# Patient Record
Sex: Male | Born: 1950 | Race: Black or African American | Hispanic: No | Marital: Single | Smoking: Former smoker
Health system: Southern US, Community
[De-identification: ages and names within clinical notes are randomized; demographics above are authoritative.]

## PROBLEM LIST (undated history)

## (undated) DIAGNOSIS — R51 Headache: Secondary | ICD-10-CM

## (undated) DIAGNOSIS — C61 Malignant neoplasm of prostate: Secondary | ICD-10-CM

## (undated) DIAGNOSIS — J449 Chronic obstructive pulmonary disease, unspecified: Secondary | ICD-10-CM

## (undated) DIAGNOSIS — I471 Supraventricular tachycardia, unspecified: Secondary | ICD-10-CM

## (undated) DIAGNOSIS — I509 Heart failure, unspecified: Secondary | ICD-10-CM

## (undated) DIAGNOSIS — K219 Gastro-esophageal reflux disease without esophagitis: Secondary | ICD-10-CM

## (undated) DIAGNOSIS — J189 Pneumonia, unspecified organism: Secondary | ICD-10-CM

## (undated) DIAGNOSIS — F329 Major depressive disorder, single episode, unspecified: Secondary | ICD-10-CM

## (undated) DIAGNOSIS — N182 Chronic kidney disease, stage 2 (mild): Secondary | ICD-10-CM

## (undated) DIAGNOSIS — I10 Essential (primary) hypertension: Secondary | ICD-10-CM

## (undated) DIAGNOSIS — B1920 Unspecified viral hepatitis C without hepatic coma: Secondary | ICD-10-CM

## (undated) DIAGNOSIS — M109 Gout, unspecified: Secondary | ICD-10-CM

## (undated) DIAGNOSIS — F32A Depression, unspecified: Secondary | ICD-10-CM

## (undated) DIAGNOSIS — I4891 Unspecified atrial fibrillation: Secondary | ICD-10-CM

## (undated) DIAGNOSIS — R0602 Shortness of breath: Secondary | ICD-10-CM

## (undated) DIAGNOSIS — M1990 Unspecified osteoarthritis, unspecified site: Secondary | ICD-10-CM

## (undated) DIAGNOSIS — K449 Diaphragmatic hernia without obstruction or gangrene: Secondary | ICD-10-CM

## (undated) DIAGNOSIS — Z950 Presence of cardiac pacemaker: Secondary | ICD-10-CM

## (undated) DIAGNOSIS — R011 Cardiac murmur, unspecified: Secondary | ICD-10-CM

## (undated) DIAGNOSIS — I429 Cardiomyopathy, unspecified: Secondary | ICD-10-CM

## (undated) HISTORY — PX: SUPRAVENTRICULAR TACHYCARDIA ABLATION: SHX6106

## (undated) HISTORY — DX: Unspecified viral hepatitis C without hepatic coma: B19.20

---

## 1997-11-06 ENCOUNTER — Encounter: Payer: Self-pay | Admitting: Emergency Medicine

## 1997-11-06 ENCOUNTER — Emergency Department (HOSPITAL_COMMUNITY): Admission: EM | Admit: 1997-11-06 | Discharge: 1997-11-06 | Payer: Self-pay | Admitting: Emergency Medicine

## 1998-08-08 ENCOUNTER — Encounter: Payer: Self-pay | Admitting: Emergency Medicine

## 1998-08-08 ENCOUNTER — Emergency Department (HOSPITAL_COMMUNITY): Admission: EM | Admit: 1998-08-08 | Discharge: 1998-08-08 | Payer: Self-pay | Admitting: Emergency Medicine

## 2000-12-21 ENCOUNTER — Inpatient Hospital Stay (HOSPITAL_COMMUNITY): Admission: EM | Admit: 2000-12-21 | Discharge: 2000-12-26 | Payer: Self-pay | Admitting: Emergency Medicine

## 2000-12-21 ENCOUNTER — Encounter: Payer: Self-pay | Admitting: Emergency Medicine

## 2001-01-18 ENCOUNTER — Inpatient Hospital Stay (HOSPITAL_COMMUNITY): Admission: EM | Admit: 2001-01-18 | Discharge: 2001-01-23 | Payer: Self-pay | Admitting: *Deleted

## 2001-01-22 ENCOUNTER — Encounter: Payer: Self-pay | Admitting: Cardiovascular Disease

## 2004-01-02 ENCOUNTER — Encounter (INDEPENDENT_AMBULATORY_CARE_PROVIDER_SITE_OTHER): Payer: Self-pay | Admitting: Cardiovascular Disease

## 2004-01-02 ENCOUNTER — Inpatient Hospital Stay (HOSPITAL_COMMUNITY): Admission: EM | Admit: 2004-01-02 | Discharge: 2004-01-04 | Payer: Self-pay | Admitting: *Deleted

## 2005-05-26 ENCOUNTER — Emergency Department (HOSPITAL_COMMUNITY): Admission: EM | Admit: 2005-05-26 | Discharge: 2005-05-26 | Payer: Self-pay | Admitting: Emergency Medicine

## 2006-05-06 ENCOUNTER — Inpatient Hospital Stay (HOSPITAL_COMMUNITY): Admission: EM | Admit: 2006-05-06 | Discharge: 2006-05-11 | Payer: Self-pay | Admitting: Emergency Medicine

## 2006-05-07 ENCOUNTER — Encounter (INDEPENDENT_AMBULATORY_CARE_PROVIDER_SITE_OTHER): Payer: Self-pay | Admitting: Cardiovascular Disease

## 2007-06-16 ENCOUNTER — Inpatient Hospital Stay (HOSPITAL_COMMUNITY): Admission: AD | Admit: 2007-06-16 | Discharge: 2007-06-18 | Payer: Self-pay | Admitting: Cardiovascular Disease

## 2007-06-17 ENCOUNTER — Encounter (INDEPENDENT_AMBULATORY_CARE_PROVIDER_SITE_OTHER): Payer: Self-pay | Admitting: Cardiovascular Disease

## 2007-07-07 ENCOUNTER — Encounter: Admission: RE | Admit: 2007-07-07 | Discharge: 2007-07-07 | Payer: Self-pay | Admitting: Cardiovascular Disease

## 2008-12-10 ENCOUNTER — Inpatient Hospital Stay (HOSPITAL_COMMUNITY): Admission: EM | Admit: 2008-12-10 | Discharge: 2008-12-12 | Payer: Self-pay | Admitting: Emergency Medicine

## 2008-12-12 ENCOUNTER — Encounter (INDEPENDENT_AMBULATORY_CARE_PROVIDER_SITE_OTHER): Payer: Self-pay | Admitting: Cardiovascular Disease

## 2009-12-10 ENCOUNTER — Inpatient Hospital Stay (HOSPITAL_COMMUNITY): Admission: EM | Admit: 2009-12-10 | Discharge: 2009-12-13 | Payer: Self-pay | Admitting: Emergency Medicine

## 2009-12-13 ENCOUNTER — Encounter (INDEPENDENT_AMBULATORY_CARE_PROVIDER_SITE_OTHER): Payer: Self-pay | Admitting: Cardiovascular Disease

## 2010-01-20 ENCOUNTER — Emergency Department (HOSPITAL_COMMUNITY)
Admission: EM | Admit: 2010-01-20 | Discharge: 2010-01-20 | Payer: Self-pay | Source: Home / Self Care | Admitting: Emergency Medicine

## 2010-04-12 ENCOUNTER — Inpatient Hospital Stay (HOSPITAL_COMMUNITY)
Admission: EM | Admit: 2010-04-12 | Discharge: 2010-04-21 | DRG: 292 | Disposition: A | Discharge status: Home / Self Care | Payer: Medicare Other | Attending: Cardiovascular Disease | Admitting: Cardiovascular Disease

## 2010-04-12 DIAGNOSIS — I4729 Other ventricular tachycardia: Secondary | ICD-10-CM | POA: Diagnosis present

## 2010-04-12 DIAGNOSIS — I2789 Other specified pulmonary heart diseases: Secondary | ICD-10-CM | POA: Diagnosis present

## 2010-04-12 DIAGNOSIS — F1410 Cocaine abuse, uncomplicated: Secondary | ICD-10-CM | POA: Diagnosis present

## 2010-04-12 DIAGNOSIS — I472 Ventricular tachycardia, unspecified: Secondary | ICD-10-CM | POA: Diagnosis present

## 2010-04-12 DIAGNOSIS — I428 Other cardiomyopathies: Secondary | ICD-10-CM | POA: Diagnosis present

## 2010-04-12 DIAGNOSIS — I5023 Acute on chronic systolic (congestive) heart failure: Principal | ICD-10-CM | POA: Diagnosis present

## 2010-04-12 DIAGNOSIS — I129 Hypertensive chronic kidney disease with stage 1 through stage 4 chronic kidney disease, or unspecified chronic kidney disease: Secondary | ICD-10-CM | POA: Diagnosis present

## 2010-04-12 DIAGNOSIS — N183 Chronic kidney disease, stage 3 unspecified: Secondary | ICD-10-CM | POA: Diagnosis present

## 2010-04-12 DIAGNOSIS — I509 Heart failure, unspecified: Secondary | ICD-10-CM | POA: Diagnosis present

## 2010-04-12 DIAGNOSIS — K449 Diaphragmatic hernia without obstruction or gangrene: Secondary | ICD-10-CM | POA: Diagnosis present

## 2010-04-12 DIAGNOSIS — F17200 Nicotine dependence, unspecified, uncomplicated: Secondary | ICD-10-CM | POA: Diagnosis present

## 2010-04-13 ENCOUNTER — Emergency Department (HOSPITAL_COMMUNITY): Payer: Medicare Other

## 2010-04-13 LAB — URINE MICROSCOPIC-ADD ON

## 2010-04-13 LAB — CK TOTAL AND CKMB (NOT AT ARMC)
CK, MB: 20.3 ng/mL (ref 0.3–4.0)
Relative Index: 7.1 — ABNORMAL HIGH (ref 0.0–2.5)
Total CK: 286 U/L — ABNORMAL HIGH (ref 7–232)

## 2010-04-13 LAB — COMPREHENSIVE METABOLIC PANEL
Albumin: 3 g/dL — ABNORMAL LOW (ref 3.5–5.2)
BUN: 47 mg/dL — ABNORMAL HIGH (ref 6–23)
Chloride: 103 mEq/L (ref 96–112)
Creatinine, Ser: 2.44 mg/dL — ABNORMAL HIGH (ref 0.4–1.5)
GFR calc non Af Amer: 27 mL/min — ABNORMAL LOW (ref >60)
Glucose, Bld: 138 mg/dL — ABNORMAL HIGH (ref 70–99)
Total Bilirubin: 2.8 mg/dL — ABNORMAL HIGH (ref 0.3–1.2)

## 2010-04-13 LAB — DIFFERENTIAL
Basophils Absolute: 0 10*3/uL (ref 0.0–0.1)
Basophils Relative: 0 % (ref 0–1)
Eosinophils Absolute: 0 10*3/uL (ref 0.0–0.7)
Eosinophils Relative: 0 % (ref 0–5)
Lymphocytes Relative: 14 % (ref 12–46)
Lymphs Abs: 1.1 10*3/uL (ref 0.7–4.0)
Monocytes Absolute: 0.8 10*3/uL (ref 0.1–1.0)
Monocytes Relative: 9 % (ref 3–12)
Neutro Abs: 6.3 10*3/uL (ref 1.7–7.7)
Neutrophils Relative %: 77 % (ref 43–77)

## 2010-04-13 LAB — URINALYSIS, ROUTINE W REFLEX MICROSCOPIC
Hgb urine dipstick: NEGATIVE
Ketones, ur: NEGATIVE mg/dL
Leukocytes, UA: NEGATIVE
Nitrite: NEGATIVE
Protein, ur: 100 mg/dL — AB
Specific Gravity, Urine: 1.019 (ref 1.005–1.030)
Urine Glucose, Fasting: NEGATIVE mg/dL
Urobilinogen, UA: 1 mg/dL (ref 0.0–1.0)
pH: 5 (ref 5.0–8.0)

## 2010-04-13 LAB — CBC
HCT: 48.7 % (ref 39.0–52.0)
Hemoglobin: 16.1 g/dL (ref 13.0–17.0)
MCH: 32.9 pg (ref 26.0–34.0)
MCHC: 33.1 g/dL (ref 30.0–36.0)
MCV: 99.6 fL (ref 78.0–100.0)
Platelets: 210 10*3/uL (ref 150–400)
RBC: 4.89 MIL/uL (ref 4.22–5.81)
RDW: 13.3 % (ref 11.5–15.5)
WBC: 8.2 10*3/uL (ref 4.0–10.5)

## 2010-04-13 LAB — BASIC METABOLIC PANEL
BUN: 40 mg/dL — ABNORMAL HIGH (ref 6–23)
CO2: 19 mEq/L (ref 19–32)
Calcium: 8.9 mg/dL (ref 8.4–10.5)
Chloride: 100 mEq/L (ref 96–112)
Creatinine, Ser: 2.61 mg/dL — ABNORMAL HIGH (ref 0.4–1.5)
GFR calc Af Amer: 31 mL/min — ABNORMAL LOW (ref >60)
GFR calc non Af Amer: 25 mL/min — ABNORMAL LOW (ref >60)
Glucose, Bld: 111 mg/dL — ABNORMAL HIGH (ref 70–99)
Potassium: 4.7 mEq/L (ref 3.5–5.1)
Sodium: 129 mEq/L — ABNORMAL LOW (ref 135–145)

## 2010-04-13 LAB — CARDIAC PANEL(CRET KIN+CKTOT+MB+TROPI)
CK, MB: 16.1 ng/mL (ref 0.3–4.0)
Total CK: 189 U/L (ref 7–232)
Total CK: 188 U/L (ref 7–232)
Troponin I: 0.1 ng/mL — ABNORMAL HIGH (ref 0.00–0.06)

## 2010-04-13 LAB — APTT: aPTT: 28 seconds (ref 24–37)

## 2010-04-13 LAB — BRAIN NATRIURETIC PEPTIDE: Pro B Natriuretic peptide (BNP): >3200 pg/mL — ABNORMAL HIGH (ref 0.0–100.0)

## 2010-04-13 LAB — TROPONIN I: Troponin I: 0.1 ng/mL — ABNORMAL HIGH (ref 0.00–0.06)

## 2010-04-13 LAB — PROTIME-INR
INR: 1.31 (ref 0.00–1.49)
Prothrombin Time: 16.5 seconds — ABNORMAL HIGH (ref 11.6–15.2)

## 2010-04-14 LAB — BASIC METABOLIC PANEL
CO2: 21 mEq/L (ref 19–32)
Chloride: 105 mEq/L (ref 96–112)
Glucose, Bld: 116 mg/dL — ABNORMAL HIGH (ref 70–99)
Potassium: 4.2 mEq/L (ref 3.5–5.1)
Sodium: 137 mEq/L (ref 135–145)

## 2010-04-15 LAB — BASIC METABOLIC PANEL
CO2: 27 mEq/L (ref 19–32)
Chloride: 104 mEq/L (ref 96–112)
GFR calc Af Amer: 35 mL/min — ABNORMAL LOW (ref >60)
Glucose, Bld: 103 mg/dL — ABNORMAL HIGH (ref 70–99)
Potassium: 3.7 mEq/L (ref 3.5–5.1)
Sodium: 138 mEq/L (ref 135–145)

## 2010-04-16 LAB — BASIC METABOLIC PANEL
CO2: 28 mEq/L (ref 19–32)
GFR calc Af Amer: 51 mL/min — ABNORMAL LOW (ref >60)
GFR calc non Af Amer: 42 mL/min — ABNORMAL LOW (ref >60)
Glucose, Bld: 118 mg/dL — ABNORMAL HIGH (ref 70–99)
Potassium: 3.8 mEq/L (ref 3.5–5.1)
Sodium: 136 mEq/L (ref 135–145)

## 2010-04-19 LAB — CBC
HCT: 43.1 % (ref 39.0–52.0)
Hemoglobin: 14.5 g/dL (ref 13.0–17.0)
MCH: 33 pg (ref 26.0–34.0)
MCHC: 33.6 g/dL (ref 30.0–36.0)
RBC: 4.39 MIL/uL (ref 4.22–5.81)

## 2010-04-19 LAB — BASIC METABOLIC PANEL
CO2: 29 mEq/L (ref 19–32)
Chloride: 97 mEq/L (ref 96–112)
Creatinine, Ser: 1.39 mg/dL (ref 0.4–1.5)
GFR calc Af Amer: >60 mL/min (ref >60)
Potassium: 3.7 mEq/L (ref 3.5–5.1)

## 2010-04-19 LAB — BRAIN NATRIURETIC PEPTIDE: Pro B Natriuretic peptide (BNP): 570 pg/mL — ABNORMAL HIGH (ref 0.0–100.0)

## 2010-04-21 LAB — BASIC METABOLIC PANEL
CO2: 29 mEq/L (ref 19–32)
Calcium: 9 mg/dL (ref 8.4–10.5)
Chloride: 98 mEq/L (ref 96–112)
GFR calc Af Amer: 58 mL/min — ABNORMAL LOW (ref >60)
Glucose, Bld: 86 mg/dL (ref 70–99)
Sodium: 133 mEq/L — ABNORMAL LOW (ref 135–145)

## 2010-04-29 LAB — CBC
HCT: 39.6 % (ref 39.0–52.0)
Hemoglobin: 13.3 g/dL (ref 13.0–17.0)
MCH: 33.7 pg (ref 26.0–34.0)
MCHC: 33.6 g/dL (ref 30.0–36.0)
MCV: 100.3 fL — ABNORMAL HIGH (ref 78.0–100.0)
RBC: 3.95 MIL/uL — ABNORMAL LOW (ref 4.22–5.81)

## 2010-04-29 LAB — BASIC METABOLIC PANEL
CO2: 22 mEq/L (ref 19–32)
Chloride: 109 mEq/L (ref 96–112)
GFR calc Af Amer: 42 mL/min — ABNORMAL LOW (ref >60)
Glucose, Bld: 122 mg/dL — ABNORMAL HIGH (ref 70–99)
Potassium: 4.7 mEq/L (ref 3.5–5.1)
Sodium: 138 mEq/L (ref 135–145)

## 2010-04-30 LAB — COMPREHENSIVE METABOLIC PANEL
AST: 191 U/L — ABNORMAL HIGH (ref 0–37)
Albumin: 2.9 g/dL — ABNORMAL LOW (ref 3.5–5.2)
Calcium: 8.8 mg/dL (ref 8.4–10.5)
Creatinine, Ser: 1.98 mg/dL — ABNORMAL HIGH (ref 0.4–1.5)
GFR calc Af Amer: 42 mL/min — ABNORMAL LOW (ref >60)
Total Protein: 5.6 g/dL — ABNORMAL LOW (ref 6.0–8.3)

## 2010-04-30 LAB — BRAIN NATRIURETIC PEPTIDE
Pro B Natriuretic peptide (BNP): >3200 pg/mL — ABNORMAL HIGH (ref 0.0–100.0)
Pro B Natriuretic peptide (BNP): >3200 pg/mL — ABNORMAL HIGH (ref 0.0–100.0)

## 2010-04-30 LAB — BASIC METABOLIC PANEL
BUN: 35 mg/dL — ABNORMAL HIGH (ref 6–23)
CO2: 22 mEq/L (ref 19–32)
CO2: 22 mEq/L (ref 19–32)
CO2: 26 mEq/L (ref 19–32)
Chloride: 101 mEq/L (ref 96–112)
Chloride: 109 mEq/L (ref 96–112)
Chloride: 110 mEq/L (ref 96–112)
Creatinine, Ser: 2.32 mg/dL — ABNORMAL HIGH (ref 0.4–1.5)
GFR calc Af Amer: 38 mL/min — ABNORMAL LOW (ref >60)
GFR calc non Af Amer: 39 mL/min — ABNORMAL LOW (ref >60)
Glucose, Bld: 122 mg/dL — ABNORMAL HIGH (ref 70–99)
Glucose, Bld: 140 mg/dL — ABNORMAL HIGH (ref 70–99)
Potassium: 3.3 mEq/L — ABNORMAL LOW (ref 3.5–5.1)
Potassium: 3.8 mEq/L (ref 3.5–5.1)
Potassium: 4.5 mEq/L (ref 3.5–5.1)
Sodium: 137 mEq/L (ref 135–145)

## 2010-04-30 LAB — POCT I-STAT 3, ART BLOOD GAS (G3+)
Bicarbonate: 29.1 mEq/L — ABNORMAL HIGH (ref 20.0–24.0)
TCO2: 30 mmol/L (ref 0–100)
pCO2 arterial: 44.5 mmHg (ref 35.0–45.0)
pH, Arterial: 7.423 (ref 7.350–7.450)
pO2, Arterial: 54 mmHg — ABNORMAL LOW (ref 80.0–100.0)

## 2010-04-30 LAB — CARDIAC PANEL(CRET KIN+CKTOT+MB+TROPI)
CK, MB: 7 ng/mL (ref 0.3–4.0)
Total CK: 180 U/L (ref 7–232)
Troponin I: 0.08 ng/mL — ABNORMAL HIGH (ref 0.00–0.06)
Troponin I: 0.09 ng/mL — ABNORMAL HIGH (ref 0.00–0.06)

## 2010-04-30 LAB — CBC
HCT: 41.1 % (ref 39.0–52.0)
HCT: 44 % (ref 39.0–52.0)
Hemoglobin: 14.9 g/dL (ref 13.0–17.0)
MCH: 33.7 pg (ref 26.0–34.0)
MCHC: 33.9 g/dL (ref 30.0–36.0)
MCV: 101.2 fL — ABNORMAL HIGH (ref 78.0–100.0)
RDW: 13.2 % (ref 11.5–15.5)
WBC: 7 10*3/uL (ref 4.0–10.5)

## 2010-04-30 LAB — CULTURE, BLOOD (ROUTINE X 2): Culture  Setup Time: 201110251355

## 2010-04-30 LAB — DIFFERENTIAL
Basophils Absolute: 0.1 10*3/uL (ref 0.0–0.1)
Eosinophils Absolute: 0.1 10*3/uL (ref 0.0–0.7)
Eosinophils Relative: 1 % (ref 0–5)
Lymphocytes Relative: 18 % (ref 12–46)
Lymphs Abs: 1.3 10*3/uL (ref 0.7–4.0)
Monocytes Absolute: 0.7 10*3/uL (ref 0.1–1.0)

## 2010-04-30 LAB — POCT CARDIAC MARKERS
CKMB, poc: 3.6 ng/mL (ref 1.0–8.0)
Troponin i, poc: <0.05 ng/mL (ref 0.00–0.09)
Troponin i, poc: <0.05 ng/mL (ref 0.00–0.09)

## 2010-04-30 LAB — POCT I-STAT 3, VENOUS BLOOD GAS (G3P V)
Bicarbonate: 30.1 mEq/L — ABNORMAL HIGH (ref 20.0–24.0)
TCO2: 31 mmol/L (ref 0–100)
pCO2, Ven: 44.7 mmHg — ABNORMAL LOW (ref 45.0–50.0)
pH, Ven: 7.436 — ABNORMAL HIGH (ref 7.250–7.300)

## 2010-05-10 NOTE — Discharge Summary (Signed)
[name]                 ACCOUNT NO.:  [id number]  MEDICAL RECORD NO.:  [id number]           PATIENT TYPE:  I  LOCATION:  4735                         FACILITY:  [facility]  PHYSICIAN:  Ricki Rodriguez, M.D.  DATE OF BIRTH:  11-Jan-1951  DATE OF ADMISSION:  04/12/2010 DATE OF DISCHARGE:  04/21/2010                              DISCHARGE SUMMARY   The patient was admitted by Dr. Donia Guiles.  DISCHARGING PHYSICIAN:  Ricki Rodriguez, MD  FINAL DIAGNOSES: 1. Acute-on-chronic left heart systolic failure. 2. Acute bronchitis. 3. Chronic obstructive lung disease. 4. Chronic kidney disease, stage II. 5. Nonsustained ventricular tachycardia. 6. Dilated cardiomyopathy.  DISCHARGE MEDICATIONS: 1. Digoxin 0.125 mg one daily. 2. Diltiazem 30 mg one twice daily. 3. Guaifenesin DM 15 mL every 4 hour as needed. 4. Isosorbide dinitrate and hydralazine 20/37.5 mg (BiDil) one tablet     three times daily. 5. Loratadine 10 mg daily. 6. Spironolactone 25 mg daily. 7. Warfarin 2 mg daily at bedtime. 8. Aspirin 81 mg daily in a.m. 9. Bystolic 10 mg daily in the morning and 5 mg daily in the evening. 10.K-Dur 10 mEq one daily. 11.Lasix 40 mg one in the morning. 12.Lisinopril 10 mg one daily. 13.Nexium 40 mg one in the morning. 14.Oxycodone 5 mg one daily as needed. 15.Tylenol Extra Strength 500 mg two tablets every 8 hours as needed.  DISCHARGE DIET:  Low-sodium heart-healthy diet with a 1200 mL fluid restriction per day.  DISCHARGE ACTIVITY:  The patient will increase activity slowly as tolerated.  Followup by Dr. [name] in 1 week.  The patient is to call [phone] for appointment. Followup by [facility], Dr. Madilyn Fireman, for hiatal hernia and reflux esophagitis symptoms evaluation.  The patient is to call [phone] for appointment on April 23, 2010, in the morning.  SPECIAL INSTRUCTION:  The patient is to stop any activity that causes chest pain, shortness of  breath, dizziness, sweating, or excessive weakness.  HISTORY:  This 60 year old black male with a nonischemic cardiomyopathy presented with symptoms of worsening heart failure including shortness of breath, paroxysmal nocturnal dyspnea, and orthopnea.  PHYSICAL EXAMINATION:  VITAL SIGNS:  Blood pressure 136/102, heart rate 60, respirations 25. GENERAL:  The patient is a thin black male, in mild respiratory distress. HEENT:  The patient is normocephalic, atraumatic with brown eyes. Conjunctivae pink.  Sclerae nonicteric. NECK:  Moderate JVD. LUNGS:  Bibasilar crackles. HEART:  Normal S1 and S2 with grade 2/6 systolic murmur. ABDOMEN:  Soft and mild epigastric area tenderness. EXTREMITIES:  A 1+ edema.  LABORATORY DATA:  Normal hemoglobin/hematocrit, WBC count, and platelet count.  INR 1.31.  Electrolytes normal, BUN elevated at 47, creatinine elevated at 2.44.  Subsequent BUN was 30, creatinine was 1.49.  Albumin was low at 3.0 and total bilirubin was slightly elevated at 2.8, alkaline phosphatase was normal.  AST was 125 and ALT was 99.  CK elevated at 286 with an MB of 20.3, troponin I elevated at 0.10. Urinalysis was unremarkable except for 100 mg of protein and few hyaline cast with cloudy appearance of the urine.  EKG showed  wide QRS tachycardia. A 2-D echocardiogram showed mildly dilated left ventricle with a severe LV systolic dysfunction and diffuse hypokinesia, severe mitral regurgitation, and mild thickness of right ventricle and mild dilatation of the right atrium also along with mild dilatation of left atrium. Chest x-ray showed stable severe cardiomegaly with no acute cardiopulmonary abnormality.  HOSPITAL COURSE:  The patient was admitted to telemetry unit.  He had mildly abnormal cardiac enzymes consistent with his acute heart failure. He received IV Lasix and beta-blocker therapy to control his wide- complex tachycardia as well as heart failure.  His condition  gradually stabilized over 1 week period.  He developed bronchitis in between requiring Z-Pak and breathing treatments with Xopenex.  His renal function slightly improved and his bronchitis was controlled.  He had complaints about reflux esophagitis.  GI consultation recommended outpatient followup in few days, appointment was given.  On April 21, 2010, the patient was discharged home in satisfactory condition with addition of Lanoxin adjustment of his diltiazem dose.  Addition of BiDil, spironolactone, and he was started on low-dose Coumadin.     Ricki Rodriguez, M.D.     ASK/MEDQ  D:  05/07/2010  T:  05/08/2010  Job:  [id number]  Electronically Signed by [name] M.D. on 05/10/2010 09:20:13 PM

## 2010-05-22 LAB — URINALYSIS, ROUTINE W REFLEX MICROSCOPIC
Glucose, UA: NEGATIVE mg/dL
Hgb urine dipstick: NEGATIVE
Protein, ur: 30 mg/dL — AB

## 2010-05-22 LAB — CBC
HCT: 38.1 % — ABNORMAL LOW (ref 39.0–52.0)
MCHC: 33.5 g/dL (ref 30.0–36.0)
Platelets: 214 10*3/uL (ref 150–400)
Platelets: 245 10*3/uL (ref 150–400)
RBC: 4.05 MIL/uL — ABNORMAL LOW (ref 4.22–5.81)
RDW: 13 % (ref 11.5–15.5)
RDW: 13.3 % (ref 11.5–15.5)
WBC: 5.4 10*3/uL (ref 4.0–10.5)

## 2010-05-22 LAB — BASIC METABOLIC PANEL
BUN: 25 mg/dL — ABNORMAL HIGH (ref 6–23)
BUN: 35 mg/dL — ABNORMAL HIGH (ref 6–23)
CO2: 24 mEq/L (ref 19–32)
Calcium: 9 mg/dL (ref 8.4–10.5)
Chloride: 106 mEq/L (ref 96–112)
Chloride: 109 mEq/L (ref 96–112)
Creatinine, Ser: 2.14 mg/dL — ABNORMAL HIGH (ref 0.4–1.5)
GFR calc Af Amer: 39 mL/min — ABNORMAL LOW (ref >60)
GFR calc Af Amer: 40 mL/min — ABNORMAL LOW (ref >60)
GFR calc non Af Amer: 32 mL/min — ABNORMAL LOW (ref >60)
Potassium: 4 mEq/L (ref 3.5–5.1)

## 2010-05-22 LAB — COMPREHENSIVE METABOLIC PANEL
ALT: 61 U/L — ABNORMAL HIGH (ref 0–53)
Albumin: 3.3 g/dL — ABNORMAL LOW (ref 3.5–5.2)
Alkaline Phosphatase: 94 U/L (ref 39–117)
GFR calc Af Amer: 38 mL/min — ABNORMAL LOW (ref >60)
Potassium: 4.5 mEq/L (ref 3.5–5.1)
Sodium: 136 mEq/L (ref 135–145)
Total Protein: 6.1 g/dL (ref 6.0–8.3)

## 2010-05-22 LAB — BRAIN NATRIURETIC PEPTIDE: Pro B Natriuretic peptide (BNP): 1569 pg/mL — ABNORMAL HIGH (ref 0.0–100.0)

## 2010-05-22 LAB — HEPARIN LEVEL (UNFRACTIONATED): Heparin Unfractionated: 1.02 IU/mL — ABNORMAL HIGH (ref 0.30–0.70)

## 2010-05-22 LAB — DIFFERENTIAL
Basophils Absolute: 0.1 10*3/uL (ref 0.0–0.1)
Basophils Relative: 1 % (ref 0–1)
Neutro Abs: 4.2 10*3/uL (ref 1.7–7.7)
Neutrophils Relative %: 67 % (ref 43–77)

## 2010-05-22 LAB — URINE MICROSCOPIC-ADD ON

## 2010-05-22 LAB — POCT I-STAT, CHEM 8
Chloride: 110 mEq/L (ref 96–112)
Creatinine, Ser: 2.2 mg/dL — ABNORMAL HIGH (ref 0.4–1.5)
Glucose, Bld: 113 mg/dL — ABNORMAL HIGH (ref 70–99)
HCT: 41 % (ref 39.0–52.0)
Potassium: 4.6 mEq/L (ref 3.5–5.1)
Sodium: 137 mEq/L (ref 135–145)

## 2010-05-22 LAB — POCT CARDIAC MARKERS
CKMB, poc: 3 ng/mL (ref 1.0–8.0)
Myoglobin, poc: 93.9 ng/mL (ref 12–200)
Troponin i, poc: <0.05 ng/mL (ref 0.00–0.09)

## 2010-05-22 LAB — TROPONIN I
Troponin I: 0.09 ng/mL — ABNORMAL HIGH (ref 0.00–0.06)
Troponin I: 0.09 ng/mL — ABNORMAL HIGH (ref 0.00–0.06)

## 2010-05-22 LAB — CK TOTAL AND CKMB (NOT AT ARMC)
CK, MB: 4 ng/mL (ref 0.3–4.0)
CK, MB: 4.4 ng/mL — ABNORMAL HIGH (ref 0.3–4.0)
Relative Index: INVALID (ref 0.0–2.5)
Total CK: 78 U/L (ref 7–232)
Total CK: 81 U/L (ref 7–232)

## 2010-07-01 NOTE — Discharge Summary (Signed)
[name], [name]              ACCOUNT NO.:  [id number]   MEDICAL RECORD NO.:  [id number]          PATIENT TYPE:  INP   LOCATION:  4703                         FACILITY:  [facility]   PHYSICIAN:  Ricki Rodriguez, M.D.  DATE OF BIRTH:  1950-03-06   DATE OF ADMISSION:  06/16/2007  DATE OF DISCHARGE:  06/18/2007                               DISCHARGE SUMMARY   FINAL DIAGNOSES:  1. Non-Q-wave myocardial infarction.  2. Acute on chronic left systolic heart failure.  3. Dilated cardiomyopathy.  4. Sinus tachycardia.  5. Renal insufficiency.   DISCHARGE MEDICATIONS:  1. Azor 10/40 1/2 a tablet daily.  2. Metoprolol 50 mg 1/2 a tablet 3 times daily.  3. Coumadin 5 mg daily.  4. Lasix 40 mg daily.  5. K-Dur 20 mEg 1 daily.  6. Aspirin 81 mg daily.  7. Nitroglycerin 0.4 mg 1 sublingual every 5 minutes x3 p.r.n. for      chest pain.  8. Darvocet-N 100 one 4 times daily as needed.  9. Multivitamin 1 daily.   DISCHARGE DIET:  Low-sodium heart healthy diet, 650 mL excessive fluid  restriction.   DISCHARGE ACTIVITY:  The patient to increase activity slowly.   SPECIAL INSTRUCTION:  The patient to stop any activity that causes chest  pain, shortness of breath, dizziness, sweating, or excessive weakness.  The patient to notify physician when ready for cardiac catheterization  and ICD placement.   HISTORY:  This 60 year old black male presented with an atypical chest  pain, weakness, and palpitation.  The patient had stopped beta blocker  for 1 day.   PHYSICAL EXAMINATION:  VITAL SIGNS:  Temperature 98 degrees Fahrenheit,  pulse 145, respirations 20, blood pressure 92/70, height 5 feet 9  inches, and weight approximately 150 pounds.  GENERAL:  The patient is averagely built and nourished.  HEENT:  The patient is normocephalic and atraumatic.  Eyes, pupils are  equal and reacting to light.  Eyes are brown.  Conjunctivae are pink.  Sclerae nonicteric.  NECK:  Supple.  No JVD.  LUNGS:   Clear bilaterally.  HEART:  Normal S1 and S2 without S3 gallop.  ABDOMEN:  Soft and nontender.  EXTREMITIES:  No cyanosis.  Positive clubbing.  No edema.  NEUROLOGICAL:  Alert and oriented x3.  Cranial nerves are grossly  intact.   LABORATORY DATA:  Normal hemoglobin, hematocrit, WBC count, and platelet  count.  Normal PT/INR, normal electrolytes, and BUN.  Creatinine is  slightly high at 2.7.  Subsequent creatinine was down to 1.7, albumin  normal.  AST borderline at 45.  Cardiac enzymes, total CK elevated at  304, MB elevated at 13.5, and troponin I 0.12.  Subsequent CK-MB was  down to 7 and troponin I was 0.18 followed by 0.15.   EKG revealed a sinus rhythm, left axis deviation, left ventricular  hypertrophy, and anterolateral ischemia.   Chest x-ray revealed enlargement of the cardiac silhouette with no  pulmonary edema, pneumonia, or acute process.   A 2D echocardiogram revealed markedly decreased LV systolic function  with ejection fraction of 30%.  Left ventricular wall thickness was  moderately-to-markedly increased.  There was moderate mitral  regurgitation.  Left atrium was moderately dilated.  RV systolic  function was mildly decreased.   HOSPITAL COURSE:  The patient was admitted to telemetry unit.  He ruled  in for non-Q-wave myocardial infarction.  He received beta blocker IV  fluids.  A 2D echocardiogram and serial cardiac enzymes were suggestive  of non-Q-wave myocardial infarction along with early acute on chronic  left heart systolic failure.  The patient has a known history of dilated  cardiomyopathy and renal insufficiency.  His renal insufficiency  slightly improved.  He was offered cardiac catheterization and ICD  placement in near future.  The patient wanted to go home and discuss  this with the family members prior to committing to any additional  cardiac workup, and he was discharged home in satisfactory condition on  Jun 18, 2007 with a followup by me in 2  weeks.      Ricki Rodriguez, M.D.  Electronically Signed     ASK/MEDQ  D:  07/06/2007  T:  07/07/2007  Job:  [id number]

## 2010-07-04 NOTE — Cardiovascular Report (Signed)
[facility]  Patient:    [name], [name] Visit Number: [id number] MRN: [id number]          Service Type: MED Location: 2000 2017 01 Attending Physician:  Ricki Rodriguez Dictated by:   Ricki Rodriguez, M.D. Proc. Date: 12/23/00 Admit Date:  12/21/2000                          Cardiac Catheterization  HOSPITAL LOCATION 2017  PROCEDURE:  Right and left heart catheterization, selective coronary angiography, left ventricular function study, descending aortography, and cardiac output study using the Swan-Ganz catheter.  CARDIOLOGIST:  Ricki Rodriguez, M.D.  APPROACH:  Right femoral artery using 6-French diagnostic catheters and right femoral artery using 8-French sheath and a Swan-Ganz catheter.  INDICATION:  This 60 year old black male with dilated cardiomyopathy had congestive heart failure, abnormal cardiac enzymes, and angina.  COMPLICATIONS:  Catheter-induced supraventricular tachycardia, converted to sinus rhythm with 6 mg of intracoronary adenosine injection through Swan-Ganz catheter.  HEMODYNAMIC DATA:  The left ventricular pressure was 138/25 mmHg, and the aortic pressure was 137/105 mmHg.  The right axial mean pressure was 50 mmHg. The right ventricular pressure was 58/12 mmHg.  The mean PA pressure was 41 and 46, and pulmonary capillary wedge pressure was 33 and 22.  Cardiac output was 3.6, left there for a minute with IV milrinone use and 3 liter per minute without IV milrinone use.  Cardiac index was 1.93 liters per minute per square meter with milrinone and 1.61 without milrinone.  Oxygen saturations in the pulmonary artery was 56% and aorta was 94%.  Fick cardiac output was 3.4 with milrinone, and the cardiac index was 1.8.  LEFT VENTRICULOGRAM:  The left ventriculogram showed global hypokinesia with ejection fraction of 20 to 25% with severe mitral regurgitation.  AORTOGRAM:  The aortogram showed normal renal  arteries.  CORONARY ANATOMY: Left main coronary artery:  The left main coronary artery was unremarkable.  Left anterior descending coronary artery:  The left anterior descending coronary artery was unremarkable, and diagonal #1 was also unremarkable.  Left circumflex coronary artery:  The left circumflex coronary artery was also unremarkable, and the ramus branch was unremarkable.  Right coronary artery:  The right coronary artery was dominant and a somewhat larger vessel and unremarkable for any disease.  IMPRESSION: 1. Normal coronaries. 2. Dilated cardiomyopathy. 3. Severe mitral regurgitation. 4. Hypertension. 5. Congestive heart failure with a 20% augmentation in cardiac output with    intravenous milrinone use.  RECOMMENDATIONS:  This patient will continue his antihypertensive medications including diuretics and Lanoxin use along with Coreg, and he will receive outpatient IV milrinone therapy if oral medications fail.  The patient was again reminded of compliance to the diet, activity, and medications.  He will also be started on Coumadin therapy. Dictated by:   Ricki Rodriguez, M.D. Attending Physician:  Ricki Rodriguez DD:  12/23/00 TD:  12/24/00 Job: [id number]

## 2010-07-04 NOTE — Discharge Summary (Signed)
[name], [name]                 ACCOUNT NO.:  [id number]   MEDICAL RECORD NO.:  [id number]          PATIENT TYPE:  INP   LOCATION:  4705                         FACILITY:  [facility]   PHYSICIAN:  Ricki Rodriguez, M.D.  DATE OF BIRTH:  10-28-1950   DATE OF ADMISSION:  01/02/2004  DATE OF DISCHARGE:  01/04/2004                                 DISCHARGE SUMMARY   PRINCIPAL DIAGNOSIS:  1.  Congestive heart failure.  2.  Hypertension.  3.  Chest pain.  4.  Dilated cardiomyopathy.   DISCHARGE MEDICATIONS:  1.  Sular 20 mg one daily.  2 . Lasix 40 mg one daily.  1.  K-Dur 20 mEq two daily.  2.  Coreg 6.25 mg one daily.  3.  Lisinopril 20 mg daily.  4.  Coumadin 5 mg daily.  5.  Aspirin 325 mg daily times one week and then stop.  6.  Prilosec over-the-counter 20 mg one daily.   DISCHARGE INSTRUCTIONS:  1.  Activity is as tolerated.  2.  Diet is low fat, low salt, 1500 ml fluid restriction per day.  3.  Avoid greens.  4.  Get prothrombin time checked in one week, two weeks and then every      month.   FOLLOW UP:  Ricki Rodriguez, M.D. in two weeks.   CONDITION ON DISCHARGE:  Improved.   HISTORY:  This 60 year old, black male presented with chest pain for one  week along with shortness of breath without fever, cough or chills.  The  patient admitted to leg swelling and discontinued Lasix medication as he ran  out of it for monetary reasons.   The patient has a known history of dilated cardiomyopathy.   PHYSICAL EXAMINATION:  VITAL SIGNS:  Pulse 90, respirations 20, blood  pressure 160/110, height 5'9, weight 160 lbs.  GENERAL: The patient was alert, oriented x3.  HEENT:  The patient is normocephalic, atraumatic with brown eyes. Pupils  reactive to light.  Extraocular movements intact.  Conjunctivae pink.  Sclerae somewhat muddy.  NECK:  Positive JVD with third degree angle elevation to the jaw.  No  carotid bruits.  LUNGS:  Bilateral basilar crackles.  HEART:  Normal S1, S2  with a grade 3/6 systolic murmur at the left sternal  border and apex.  ABDOMEN: Soft, nontender.  EXTREMITIES:  1+ edema, no cyanosis or clubbing.   LABORATORY DATA:  Normal electrolytes, BUN and creatinine.  Sugar borderline  at 102.  Normal hemoglobin and hematocrit.  CK-MB borderline at 6.7.  troponin-I negative x2.  Myoglobin negative x2.  B-  type natriuretic peptide was markedly elevated at 2,423.  Chest x-ray  consistent with cardiomegaly and vascular condition.  Nuclear stress test  negative for stressed induced ischemia, ejection fraction 26% with global  hypokinesia.  EKG revealed normal sinus rhythm with bi-atrial enlargement,  left intrafascicular block and left ventricular hypertrophy.  Echocardiogram  revealed dilated cardiomyopathy with 20-30% ejection fraction.  Mild mitral  regurgitation, dilated left atrium, dilated right ventricle with mild right  ventricular hypertrophy, dilated right atrium and dilated inferior vena  cava.   HOSPITAL COURSE:  The patient was admitted to the telemetry unit and  myocardial infarction was ruled out.  He received IV Lasix with good  diuresis.  He was discharged home in satisfactory condition with  electrophysiology referral on an outpatient basis for possible need for AICD  after rechecking left ventricular function and after compliance with  medication.      ASK/MEDQ  D:  03/22/2004  T:  03/23/2004  Job:  [id number]

## 2010-07-04 NOTE — H&P (Signed)
NAMEOBADIAH, [name]                 ACCOUNT NO.:  [id number]   MEDICAL RECORD NO.:  [id number]          PATIENT TYPE:  INP   LOCATION:  4705                         FACILITY:  [facility]   PHYSICIAN:  Ricki Rodriguez, M.D.  DATE OF BIRTH:  1950-06-18   DATE OF ADMISSION:  01/02/2004  DATE OF DISCHARGE:                                HISTORY & PHYSICAL   CHIEF COMPLAINT:  Shortness of breath, leg swelling, and chest pain.   HISTORY OF PRESENT ILLNESS:  The patient is a 60 year old black male who  complained of chest pain for one week and shortness of breath for four or  five days without fever, cough, or chills.  He admitted to leg swelling and  also admitted to discontinuing Lasix medication since he ran out of it for  monetary reasons.   PAST MEDICAL HISTORY:  Negative for cavities, myocardial infarction,  obesity, or alcohol and drug use.  Positive for hypertension for 20 years  and smoking a half a pack of cigarettes per day off and on for 10 years. He  had a poor left ventricular systolic function in the past.   PAST SURGICAL HISTORY:  Unremarkable.   MEDICATIONS:  Currently taking only Sular 20 mg 1 q.d. and Coreg 6.25 mg 1  b.i.d.  Recently, ran out of Lasix 40 mg 1 q.d.  Cannot afford any other  medications.   ALLERGIES:  None.   SOCIAL HISTORY:  The patient is married.  Wife's name is [name] and she is 30  years old and apparently healthy.  Has two sons, a 69 and 48 year old, and  they are also apparently healthy.   FAMILY HISTORY:  Mother died of congestive heart failure at age 65 last year  in 27-Sep-2022.  Father died in 80 at the age of 39 of pneumonia complications.  The patient has two brothers who are living and has half sister who is  living and well.   REVIEW OF SYMPTOMS:  Negative for weight gain or loss.  Negative for hearing  loss, tinnitus, rhinorrhea, cataract surgery, palpitations, dizziness,  nausea, vomiting, diarrhea, constipation, GI bleed, stomach ulcer,  hiatal  hernia, hepatitis, blood transfusion, kidney stone, stroke, and psychiatric  admissions.  Positive for wearing glasses, wearing partial dentures, having  recurrent hemoptysis when blood pressure is not controlled, and positive  history of dyspnea, congestive heart failure, chest pain, leg swelling, and  joint pains.   IMMUNIZATIONS:  Not up-to-date.   PHYSICAL EXAMINATION:  VITAL SIGNS:  Pulse 90, respiration 20, blood  pressure 160/110.  Height 5 feet 9 inches.  Weight is approximately 160  pounds.  HEENT:  The patient is normocephalic and atraumatic.  Brown eyes.  Pupil  react to light.  Extraocular movements intact.  Conjunctivae pink.  Sclerae  are somewhat muddy.  NECK:  Positive JVD at 30 degree angle up to the angle of the neck.  No  carotid bruit.  LUNGS:  Bilateral basilar crackles.  HEART:  Normal S1 and S2.  Grade 3/6 systolic murmur at the left sternal  border and at the apex.  ABDOMEN:  Soft and nontender.  EXTREMITIES:  There is 1+ edema.  No cyanosis or clubbing.  NEUROLOGICAL:  Cranial nerves grossly intact.  The patient has bilateral  equal grips.   LABORATORY DATA:  Normal electrolytes, BUN, and creatinine.  Sugar  borderline at 102, normal hemoglobin and hematocrit.  CK normal, MB  borderline at 6.7, and troponin I negative x 2.  Myoglobin negative x 2.  B-  type natriuretic peptide was markedly elevated at 2423.   Chest x-ray consistent with cardiomegaly and vascular congestion.   IMPRESSION:  1.  Congestive heart failure.  2.  Hypertension.   HOSPITAL COURSE:  The patient was given IV Lasix in the emergency room with  good diuresis.  He had urine output of near two liters in six hours.  His  condition improved markedly within those six hours.  He will receive oxygen  and undergo nuclear stress test, ultrasound of the heart, and his home  medications will be restarted and ACE inhibitor will be added and the  patient will have education on heart  failure control and dietary control.  He was strongly advised to apply for disability and take his medications  regularly.      Ajay   ASK/MEDQ  D:  01/02/2004  T:  01/02/2004  Job:  [id number]

## 2010-07-04 NOTE — Discharge Summary (Signed)
[place]  Patient:    [name], [name] Visit Number: [id number] MRN: [id number]          Service Type: MED Location: 2000 2017 01 Attending Physician:  Ricki Rodriguez Dictated by:   Ricki Rodriguez, M.D. Admit Date:  12/21/2000 Discharge Date: 12/26/2000                             Discharge Summary  PRINCIPAL DIAGNOSES: 1. Congestive heart failure. 2. Dilated cardiomyopathy. 3. Hypertension. 4. Mitral regurgitation. 5. Chronic tobacco use.  DISCHARGE MEDICATIONS: 1. Coumadin 5 mg daily. 2. Lasix 40 mg daily. 3. Protonix 40 mg daily. 4. Norvasc 5 mg daily. 5. Potassium 20 mEq daily. 6. Lanoxin 0.25 mg one daily. 7. Coreg 6.25 mg twice daily. 8. Cozaar 100 mg one daily.  DISCHARGE DIET:  Low salt, low fat diet as tolerated.  DISCHARGE ACTIVITY:  As tolerated.  The patient to avoid any heavy lifting, pushing, or pulling.  FOLLOW-UP:  The patient will follow up with Ricki Rodriguez, M.D., in two weeks.  CONDITION ON DISCHARGE:  Improved.  HISTORY OF PRESENT ILLNESS:  This 60 year old black male presented with complaint of shortness of breath, leg swelling, progressively worsening over the last two to three weeks.  The patient states he ran out of Lasix one month ago.  He already has a history of dilated cardiomyopathy and now he has significant orthopnea and chest pressure along with some leg swelling.  He has a longstanding history of hypertension and smoking one fourth to half a pack of cigarettes per day.  PHYSICAL EXAMINATION:  GENERAL APPEARANCE:  The patient was alert and oriented x 3.  VITAL SIGNS:  Temperature 97, pulse 100, respiratory rate 22, blood pressure 140/110, height 5 feet 9 inches, weight 157 pounds.  HEENT:  Head normocephalic and atraumatic. Eyes brown.  Pupils are equal, round and reactive to light.  Extraocular movements intact.  Conjunctiva pink. Ears, nose and throat:  Mucous membranes pink and  moist.  NECK:  No bruit.  Positive JVD at 30 degree angle.  LUNGS:  There were a few basal crackles.  CARDIOVASCULAR:  Normal S1 and S2 with grade 3/6 systolic murmur at the left sternal border.  ABDOMEN:  Soft and nontender.  EXTREMITIES:  No cyanosis or clubbing. There was 2+ edema.  CNS:  Cranial nerves II-XII grossly intact. The patient moved all four extremities.  LABORATORY DATA:  There was a pH of 7.45, pCO2 of 29, pO2 of 67.  Brain natriuretic peptide (BNP) was 1300.  CK was 133, MB was elevated at 7, troponin I was elevated at 0.06.  Subsequent CKs were normal but MBs and troponin I were borderline high.  Hemoglobin, hematocrit, electrolytes, BUN and creatinine were normal.  EKG showed sinus tachycardia with a biatrial enlargement plus left anterior hemiblock and left ventricular hypertrophy.  X-ray showed cardiac enlargement with vascular congestion.  Right and left heart catheterization showed unremarkable coronary arteries; however, had dilated heart, severe mitral regurgitation, ejection fraction of 20 to 25%, and 20% improvement in cardiac output using milrinone drape.  HOSPITAL COURSE:  The patient was admitted to telemetry unit.  He had mildly elevated cardiac enzymes along with markedly elevated BNP.  He received IV Lasix with a good two to three liter diuresis in the emergency room.  His home medications were restarted.  He underwent right and left heart catheterization for additional evaluation of his  congestive heart failure.  His coronary arteries were unremarkable; however, his cardiac output was diminished, ejection fraction was markedly low with global hyperkinesia and there was a 20% cardiac output augmentation with IV milrinone drip.  The patient was started on Coumadin with INR of 19.4 on day of discharge.  INR goal is 2 to 3, which would be adjusted on an outpatient basis. The patient was strongly reminded of dietary and medication compliance. He  was given starter supply of some of the medications from here and some will be given from my office. Dictated by:   Ricki Rodriguez, M.D. Attending Physician:  Ricki Rodriguez DD:  12/26/00 TD:  12/27/00 Job: [id number]

## 2010-07-04 NOTE — H&P (Signed)
NAMEARJUN, HARD               ACCOUNT NO.:  [id number]   MEDICAL RECORD NO.:  [id number]          PATIENT TYPE:  EMS   LOCATION:  MAJO                         FACILITY:  [facility]   PHYSICIAN:  Ricki Rodriguez, M.D.  DATE OF BIRTH:  14-Apr-1950   DATE OF ADMISSION:  05/06/2006  DATE OF DISCHARGE:                              HISTORY & PHYSICAL   CHIEF COMPLAINT:  Shortness of breath, right-sided chest pain and out of  medications.   HISTORY OF PRESENT ILLNESS:  This 60 year old black male, who ran out of  medications 2 weeks ago, presents to the emergency room with shortness  of breath of 2 days duration.  Patient was working on the roof of his  house and also felt right-sided chest pain.  He thinks he pulled some  muscle.  The chest x-ray, done in the emergency room, showed pulmonary  edema with a possible infiltration.  The patient does not have any fever  or chills.   PAST MEDICAL HISTORY:  1. Negative for diabetes.  2. Positive for hypertension for 22 years.  3. Patient has smoked a half pack of cigarettes per day, off and on,      for 12 years.  4. He has a known history of congestive heart failure.  5. No history of myocardial infarction, obesity, alcohol use or drug      use.   PAST SURGICAL HISTORY:  Unremarkable.   MEDICATIONS:  Currently taking no medications.  Use to take Sular, Coreg  and Lasix.   ALLERGIES:  NONE.   SOCIAL HISTORY:  Patient is married.  Wife's name is [name], who is a 47-  year-old and is apparently healthy.  Patient has 2 sons, 17 and 25-year-  old and they are also healthy.  Patient is disabled from heart  condition.   FAMILY HISTORY:  Mother died of congestive heart failure at age 24 in  2002/07/02.  Father died of pneumonia complication at age 14, in 59.  Patient has 2 brothers who are living and well and has 1 half sister who  is also living and well.   REVIEW OF SYSTEMS:  Negative for recent weight gain, weight loss.  Negative for hearing  loss, tinnitus, rhinorrhea, cataract surgery,  palpitation, dizziness, nausea, vomiting, diarrhea, constipation, GI  bleed, stomach ulcer, hiatal hernia, hepatitis, blood transfusion,  kidney stones, stroke and psychiatric admissions.  Positive for wearing  glasses.  Positive dentures.  Recurrent hemoptysis when blood pressure  medications are not taken and a positive history of dyspnea, congestive  heart failure, chest pain, leg swelling and joint pains.   IMMUNIZATIONS:  Not up-to-date.   PHYSICAL EXAMINATION:  VITAL SIGNS:  Pulse 95.  Respirations 20.  Blood  pressure 170/100.  Height 5 feet 9 inches.  Weight approximately 155  pounds.  HEENT:  Patient is normocephalic, atraumatic.  Eyes:  Brown eyes.  Pupils equally reactive to light.  Extraocular movement intact.  Conjunctivae are pink.  Sclerae nonicteric.  NECK:  Negative JVD.  Post IV Lasix use.  No carotid bruit.  LUNGS:  Bilateral basal crackles.  HEART:  Normal S1, S2 with a grade 3/6 systolic murmur at the left  sternal border and at apex.  ABDOMEN:  Soft and nontender.  EXTREMITIES:  No edema.  No cyanosis or clubbing.  CNS:  Cranial nerves grossly intact and patient moves all 4 extremities.  Bilaterally full grips.   LABORATORY DATA:  Pending.   EKG pending.   Chest x-ray showed cardiomegaly with pulmonary edema.   IMPRESSION:  1. Congestive heart failure.  2. Rule out myocardial infarction.  3. Hypertension.  4. Drug noncompliance.   PLAN:  Admit patient to telemetry.  Give home medications.  Give IV  Lasix with fluid restriction and a possible dietary consult.      Ricki Rodriguez, M.D.  Electronically Signed     ASK/MEDQ  D:  05/06/2006  T:  05/06/2006  Job:  [id number]

## 2010-07-04 NOTE — Discharge Summary (Signed)
[place]  Patient:    [name], [name] Visit Number: [id number] MRN: [id number]          Service Type: MED Location: [phone] Attending Physician:  Ricki Rodriguez Dictated by:   Ricki Rodriguez, M.D. Admit Date:  01/18/2001 Discharge Date: 01/23/2001                             Discharge Summary  PRINCIPAL DIAGNOSES: 1. Right lung pneumonia. 2. Hypertension. 3. Left maxillary sinusitis. 4. Headache. 5. Dilated cardiomyopathy.  DISCHARGE MEDICATIONS:  1. Hyzaar 100/25 one p.o. q.d.  2. Norvasc 5 mg b.i.d.  3. Lanoxin 0.25 mg q.d.  4. Enteric-coated aspirin 325 mg q.d.  5. Proventil metered dose inhaler 2 puffs q.i.d.  6. Darvocet-N 100 q.i.d. p.r.n. pain or headache.  7. Tequin 400 mg one q.d. x 1 week.  8. Coreg 6.25 mg one b.i.d.  9. Nitroglycerin tablet 0.4 mg each sublingual every 5 minutes x 3 p.r.n.     chest pain. 10. Robitussin DM 10 cc q.i.d. p.r.n.  ACTIVITY:  As tolerated.  DIET:  Low fat, low salt, 150 cc fluid restricted diet.  FOLLOWUP:  Dr. [name] in two weeks.  The patient is to call [phone] for appointment.  CONDITION ON DISCHARGE:  Improved.  HISTORY OF PRESENT ILLNESS:  This 60 year old black male with fever, cough, shortness of breath for one week had worsening shortness of breath on the day of admission.  The patient also had some chest discomfort.  He has a history of hypertension for many years, and he quit smoking one week due to this episode of bronchitis.  He has shown noncompliance to medical treatment in the past.  PHYSICAL EXAMINATION:  VITAL SIGNS:  Temperature 100, pulse 120, respiratory rate 30, blood pressure 162/100, height 5 feet 9 inch, weight 155 pounds.  GENERAL:  The patient was alert and oriented x 3.  HEENT:  Normocephalic, atraumatic.  Eyes brown with pupils equal and reactive to light.  Ears, nose, throat, mucus membranes pink and moist.  NECK:  Positive JVD.  LUNGS:   Bilateral fine crackles, right more than left.  HEART:  Normal S1 and S2 with a grade 4/6 systolic murmur at the left sternal border of the apex.  ABDOMEN:  Soft and nontender.  EXTREMITIES:  Trace edema.  NEUROLOGIC:  Cranial nerves II-XII grossly intact.  The patient had bilateral equal grips.  LABORATORY DATA:  Normal hemoglobin, hematocrit, elevated white blood cell count, normal platelet count, normal electrolytes, BUN and creatinine normal, normal CK and MB.  Subsequent white blood cell count was down to 6000 with normal differential, and subsequent electrolytes were normal.  EKG revealed sinus tachycardia with left atrial enlargement and lateral ischemia and left ventricular hypertrophy.  Chest x-ray revealed cardiomegaly with right lung infiltrates.  CT of the brain showed sinusitis and no major abnormality in the brain. Repeat chest x-ray showed significant improvement in the right lung infiltrates.  HOSPITAL COURSE:  The patient was admitted to telemetry unit.  He was started on IV Tequin 400 mg, and his home medications were continued.  His sputum Gram stain and blood cultures were unremarkable.  However, the patient had significant improvement with IV Tequin therapy.  He was started on Coreg 3.125 mg p.o. b.i.d. for his dilated cardiomyopathy.  Overall, his condition improved steadily, but somewhat slowly over four days, and on 01/23/01, he was discharged home  in satisfactory condition with a followup by me in two weeks. Dictated by:   Ricki Rodriguez, M.D. Attending Physician:  Ricki Rodriguez DD:  01/23/01 TD:  01/23/01 Job: [id number]

## 2010-07-04 NOTE — Discharge Summary (Signed)
[name], [name]                 ACCOUNT NO.:  [id number]   MEDICAL RECORD NO.:  [id number]          PATIENT TYPE:  INP   LOCATION:  4713                         FACILITY:  [facility]   PHYSICIAN:  Ricki Rodriguez, M.D.  DATE OF BIRTH:  Mar 01, 1950   DATE OF ADMISSION:  05/06/2006  DATE OF DISCHARGE:  05/11/2006                               DISCHARGE SUMMARY   FINAL DIAGNOSES:  1. Acute on chronic systolic heart failure.  2. Primary cardiomyopathy.  3. Hypertension.  4. Tobacco use disorder.  5. History of past noncompliance.  6. Congestive heart failure.  7. Long term use of anticoagulant.   PRINCIPAL PROCEDURE:  Right and left heart catheterization, selective  coronary angiography done by Dr. [name] on May 07, 2006.   DISCHARGE MEDICATIONS:  1. Coreg 3.125 mg one twice daily.  2. Lanoxin 0.125 mg one daily.  3. Spironolactone 25 mg one daily.  4. Lisinopril 10 mg one daily.  5. BiDil 20/37.5 mg one to two times daily.  6. Coumadin 5 mg daily.  7. Lasix 40 mg one daily.  8. Potassium chloride 10 mEq one daily.  9. Aspirin 81 mg one daily x3 days, then discontinue.   DISCHARGE DIET:  Low-sodium, heart healthy diet with 1500 mL fluid  restriction per day.   DISCHARGE ACTIVITIES:  The patient to increase activity slowly.   SPECIAL INSTRUCTIONS:  The patient to stop any activity that causes  chest pain, shortness of breath, dizziness, sweating, or excessive  weakness, and get blood work, PT/INR in one week and then in one month.   HISTORY:  This 60 year old black male ran out of medications two weeks  ago, presented to the emergency room with shortness of breath of two  days duration.  The patient has a known history of dilated  cardiomyopathy.  His chest x-ray showed pulmonary edema and possible  infiltration without history of fever or chills.   PHYSICAL EXAMINATION:  VITAL SIGNS:  Pulse 95, respirations 20, blood  pressure 170/100, height 5 feet 9 inches, weight  155 pounds.  HEENT:  The patient is normocephalic and atraumatic, has brown eyes.  Pupils equal and reacting to light.  Extraocular movements intact.  Conjunctivae pink.  Sclerae nonicteric.  NECK:  No JVD.  Post IV Lasix used.  No carotid bruit.  LUNGS:  Bilateral basilar crackles.  HEART:  Normal S1 and S2 with grade 3/6 systolic murmur of the left  sternal border and apex.  ABDOMEN:  Soft and nontender.  EXTREMITIES:  No edema, cyanosis, or clubbing.  CNS:  Cranial nerves grossly intact.  The patient moves all four  extremities and has bilateral equal grips.   LABORATORY DATA:  Normal hemoglobin, hematocrit, WBC count, platelet  count.  Normal PT.  INR of 1.0.  Subsequently INR 1.5.  Sodium 135,  potassium 4.8, glucose was borderline 160.  Subsequent glucose 87.  BUN  18, creatinine 1.38.  Subsequent creatinine 1.67.  Albumin low at 3.  Brain natriuretic peptide elevated at 954.  Total cholesterol 186.  HDL  cholesterol 80.  Triglycerides 72.  LDL cholesterol 92.  Chest x-ray  revealed cardiac enlargement with mild heart failure.  Echocardiogram  revealed mild mitral regurgitation and severe LV systolic dysfunction.  Right and left heart catheterization showed normal coronaries with  dilated cardiomyopathy and preserved pulmonary artery pressures.   HOSPITAL COURSE:  The patient was admitted to telemetry unit.  Myocardial infarction was ruled out.  Because of his known dilated  cardiomyopathy, he underwent right and left heart catheterization.  This  showed normal coronaries and preserved right-sided heart pressures.  Hence, EP consult was recommended with ICD placement in the near future.  The patient's overall condition improved with IV Lasix use.  He was  restarted on his Coumadin.  His Norvasc dose was decreased due to his  low blood pressure.  He was advised to discontinue aspirin once the INR  was over 2.  He was discharged home on May 11, 2006, with a followup  by me in  two weeks and by Dr. Ladona Ridgel in one month.      Ricki Rodriguez, M.D.  Electronically Signed     ASK/MEDQ  D:  06/23/2006  T:  06/23/2006  Job:  [id number]

## 2010-07-04 NOTE — Cardiovascular Report (Signed)
NAMEKOLDEN, DUPEE                 ACCOUNT NO.:  [id number]   MEDICAL RECORD NO.:  [id number]          PATIENT TYPE:  INP   LOCATION:  4713                         FACILITY:  [facility]   PHYSICIAN:  Ricki Rodriguez, M.D.  DATE OF BIRTH:  January 18, 1951   DATE OF PROCEDURE:  05/07/2006  DATE OF DISCHARGE:                            CARDIAC CATHETERIZATION   PROCEDURE:  Right and left heart catheterization, selective coronary  angiography and cardiac output study.   INDICATIONS:  This is a 60 year old black male with congestive heart  failure, chest pain and dilated cardiomyopathy who had abnormal cardiac  enzymes   APPROACH:  Right femoral artery using 4-French sheath and the right  femoral vein using a 7-French sheath and Swan-Ganz catheter.   COMPLICATIONS:  The patient had chest pain prior to coronary injection  with transient inferior lead ST elevations.  This responded very well to  sublingual nitroglycerin x2 and morphine 2 mg IV with complete  resolution of ST elevations and chest pain.   Left ventriculogram was not done to conserve the dye use.  Ejection  fraction was about 20% by echocardiogram.   Dye use was less than 30 mL.   CORONARY ANATOMY:  The left main coronary artery was unremarkable.  Left anterior descending coronary artery:  The left anterior descending  coronary artery was also unremarkable.  Diagonal-1 vessel was a large  vessel and unremarkable.  Left circumflex coronary artery:  The left circumflex coronary artery  was also essentially unremarkable.  Obtuse marginal branch one and two  were also normal.  Right coronary artery:  The right coronary artery was dominant and  showed a large posterolateral branch which was unremarkable and  posterior descending coronary artery which was also unremarkable.   Right heart pressures:  Right atrial pressure was 8/7.  The right  ventricular pressure was 40/8.  Pulmonary artery pressure was 42/16.  Wedge pressure was  9/11.  Aortic pressure was 96/65.  Left ventricular  pressure was 98/9.  Oxygen saturation was 53% in pulmonary artery and  93% in aorta.  Cardiac output was 3 and index of 1.6 by Fick method, and  cardiac output was 3.8 and index of 2.02 by thermal dilution method.  The SVR was 1,494.   IMPRESSION:  1. Normal coronaries.  2. Dilated cardiomyopathy.   PLAN:  To continue the patient on aspirin and Plavix, start heparin and  switch over to Coumadin use.  Give him ICD placement post EP consult on  outpatient basis.  Also referred him to medical assistance program for  drug compliance.      Ricki Rodriguez, M.D.  Electronically Signed     ASK/MEDQ  D:  05/07/2006  T:  05/07/2006  Job:  [id number]

## 2010-11-10 ENCOUNTER — Inpatient Hospital Stay (HOSPITAL_COMMUNITY)
Admission: EM | Admit: 2010-11-10 | Discharge: 2010-11-13 | DRG: 292 | Disposition: A | Discharge status: Home / Self Care | Payer: Medicare Other | Attending: Cardiovascular Disease | Admitting: Cardiovascular Disease

## 2010-11-10 ENCOUNTER — Emergency Department (HOSPITAL_COMMUNITY): Payer: Medicare Other

## 2010-11-10 DIAGNOSIS — K219 Gastro-esophageal reflux disease without esophagitis: Secondary | ICD-10-CM | POA: Diagnosis present

## 2010-11-10 DIAGNOSIS — E669 Obesity, unspecified: Secondary | ICD-10-CM | POA: Diagnosis present

## 2010-11-10 DIAGNOSIS — I428 Other cardiomyopathies: Secondary | ICD-10-CM | POA: Diagnosis present

## 2010-11-10 DIAGNOSIS — I129 Hypertensive chronic kidney disease with stage 1 through stage 4 chronic kidney disease, or unspecified chronic kidney disease: Secondary | ICD-10-CM | POA: Diagnosis present

## 2010-11-10 DIAGNOSIS — Z7982 Long term (current) use of aspirin: Secondary | ICD-10-CM

## 2010-11-10 DIAGNOSIS — N182 Chronic kidney disease, stage 2 (mild): Secondary | ICD-10-CM | POA: Diagnosis present

## 2010-11-10 DIAGNOSIS — F17200 Nicotine dependence, unspecified, uncomplicated: Secondary | ICD-10-CM | POA: Diagnosis present

## 2010-11-10 DIAGNOSIS — Z79899 Other long term (current) drug therapy: Secondary | ICD-10-CM

## 2010-11-10 DIAGNOSIS — I5023 Acute on chronic systolic (congestive) heart failure: Principal | ICD-10-CM | POA: Diagnosis present

## 2010-11-10 DIAGNOSIS — J4489 Other specified chronic obstructive pulmonary disease: Secondary | ICD-10-CM | POA: Diagnosis present

## 2010-11-10 DIAGNOSIS — J449 Chronic obstructive pulmonary disease, unspecified: Secondary | ICD-10-CM | POA: Diagnosis present

## 2010-11-10 DIAGNOSIS — I509 Heart failure, unspecified: Secondary | ICD-10-CM | POA: Diagnosis present

## 2010-11-10 LAB — POCT I-STAT TROPONIN I

## 2010-11-10 LAB — CBC
Hemoglobin: 14.2 g/dL (ref 13.0–17.0)
MCH: 34.2 pg — ABNORMAL HIGH (ref 26.0–34.0)
MCHC: 34.1 g/dL (ref 30.0–36.0)
MCV: 100.5 fL — ABNORMAL HIGH (ref 78.0–100.0)
RBC: 4.15 MIL/uL — ABNORMAL LOW (ref 4.22–5.81)

## 2010-11-10 LAB — COMPREHENSIVE METABOLIC PANEL
ALT: 77 U/L — ABNORMAL HIGH (ref 0–53)
AST: 84 U/L — ABNORMAL HIGH (ref 0–37)
Albumin: 3.4 g/dL — ABNORMAL LOW (ref 3.5–5.2)
Alkaline Phosphatase: 130 U/L — ABNORMAL HIGH (ref 39–117)
Chloride: 107 mEq/L (ref 96–112)
Potassium: 4.4 mEq/L (ref 3.5–5.1)
Sodium: 140 mEq/L (ref 135–145)
Total Bilirubin: 0.9 mg/dL (ref 0.3–1.2)

## 2010-11-10 LAB — TROPONIN I
Troponin I: <0.3 ng/mL (ref <0.30)
Troponin I: <0.3 ng/mL (ref <0.30)

## 2010-11-10 LAB — DIFFERENTIAL
Lymphs Abs: 1.2 10*3/uL (ref 0.7–4.0)
Monocytes Absolute: 0.7 10*3/uL (ref 0.1–1.0)
Monocytes Relative: 11 % (ref 3–12)
Neutro Abs: 3.8 10*3/uL (ref 1.7–7.7)
Neutrophils Relative %: 64 % (ref 43–77)

## 2010-11-10 LAB — PRO B NATRIURETIC PEPTIDE: Pro B Natriuretic peptide (BNP): 9836 pg/mL — ABNORMAL HIGH (ref 0–125)

## 2010-11-11 LAB — COMPREHENSIVE METABOLIC PANEL
AST: 45 — ABNORMAL HIGH
Albumin: 3.6
Alkaline Phosphatase: 82
BUN: 16
Chloride: 106
Potassium: 4.1
Total Bilirubin: 0.9

## 2010-11-11 LAB — DIFFERENTIAL
Basophils Absolute: 0
Eosinophils Absolute: 0.2
Lymphocytes Relative: 15
Lymphs Abs: 0.9
Neutro Abs: 4.3

## 2010-11-11 LAB — CARDIAC PANEL(CRET KIN+CKTOT+MB+TROPI)
CK, MB: 13.5 — ABNORMAL HIGH
Relative Index: 4.4 — ABNORMAL HIGH
Total CK: 209
Total CK: 304 — ABNORMAL HIGH
Troponin I: 0.12 — ABNORMAL HIGH

## 2010-11-11 LAB — CBC
HCT: 42.3
Platelets: 225
RBC: 4.28
WBC: 5.9

## 2010-11-12 LAB — HEPARIN LEVEL (UNFRACTIONATED)
Heparin Unfractionated: 0.22 — ABNORMAL LOW
Heparin Unfractionated: 0.25 — ABNORMAL LOW
Heparin Unfractionated: 0.32

## 2010-11-12 LAB — CBC
Hemoglobin: 14 g/dL (ref 13.0–17.0)
MCHC: 33.6
MCV: 99.5 fL (ref 78.0–100.0)
Platelets: 218 10*3/uL (ref 150–400)
RBC: 4.14 MIL/uL — ABNORMAL LOW (ref 4.22–5.81)
RBC: 3.71 — ABNORMAL LOW
RDW: 11.7
WBC: 5.9 10*3/uL (ref 4.0–10.5)
WBC: 4.7

## 2010-11-12 LAB — CARDIAC PANEL(CRET KIN+CKTOT+MB+TROPI)
CK, MB: 7 — ABNORMAL HIGH
Total CK: 174

## 2010-11-12 LAB — PROTIME-INR
Prothrombin Time: 12.8
Prothrombin Time: 12.8

## 2010-11-12 LAB — BASIC METABOLIC PANEL
BUN: 16
CO2: 23 mEq/L (ref 19–32)
CO2: 24
Calcium: 9.4
Chloride: 102 mEq/L (ref 96–112)
Chloride: 108
Creatinine, Ser: 1.76 mg/dL — ABNORMAL HIGH (ref 0.50–1.35)
Creatinine, Ser: 2.08 — ABNORMAL HIGH
GFR calc Af Amer: 40 — ABNORMAL LOW
GFR calc non Af Amer: 42 — ABNORMAL LOW
Glucose, Bld: 107 mg/dL — ABNORMAL HIGH (ref 70–99)
Glucose, Bld: 77
Sodium: 139

## 2010-11-12 LAB — B-NATRIURETIC PEPTIDE (CONVERTED LAB): Pro B Natriuretic peptide (BNP): 283 — ABNORMAL HIGH

## 2010-11-14 NOTE — H&P (Signed)
[name], [name]                 ACCOUNT NO.:  [id number]  MEDICAL RECORD NO.:  [id number]  LOCATION:  4703                         FACILITY:  [facility]  PHYSICIAN:  Ricki Rodriguez, M.D.  DATE OF BIRTH:  1950-05-28  DATE OF ADMISSION:  11/10/2010 DATE OF DISCHARGE:                             HISTORY & PHYSICAL   CHIEF COMPLAINT:  Shortness of breath.  HISTORY OF PRESENT ILLNESS:  This 60 year old black male has a 2-day history of shortness of breath in spite of taking medication.  The patient has a known history of dilated cardiomyopathy with echocardiogram 6 months ago showing left ventricle ejection fraction of 25% to 30%, diffuse hypokinesia and severe mitral regurgitation.  PAST MEDICAL HISTORY: 1. Negative for diabetes. 2. Positive for hypertension for many years. 3. Positive for smoking half a pack of cigarettes per day off and on     for 15+ years. 4. Known history of congestive heart failure. 5. No history of myocardial infarction. 6. Obesity. 7. Alcohol use or drug use.  PAST SURGICAL HISTORY:  Unremarkable.  CURRENT MEDICATIONS: 1. Lanoxin 0.125 mg one daily. 2. Diltiazem 30 mg one twice daily. 3. Isosorbide dinitrate with hydralazine half a tablet three times     daily. 4. Spironolactone 25 mg daily. 5. Aspirin 81 mg daily. 6. Bystolic 10 mg in the a.m. and 5 mg in the p.m. 7. Lasix 40 mg one daily. 8. K-Dur 10 mEq one daily. 9. Lisinopril 5 mg daily. 10.Oxycodone 5 mg one daily as needed. 11.Nexium 40 mg one daily as needed.  ALLERGIES:  No known drug allergies.  SOCIAL HISTORY:  The patient is married and his wife [name] is 31 year old.  The patient has two sons, 54 and 27 year old, apparently healthy. The patient is disabled from heart failure of longstanding duration.  FAMILY HISTORY:  Mother died of congestive heart failure at 65 in 06-30-02. Father died of pneumonia complications at age 50 in 68.  The patient has two brothers who are living and one  half-sister who is also living and well.  REVIEW OF SYSTEMS:  Negative for recent weight gain, weight loss. Negative for hearing loss, tinnitus, rhinorrhea, cataract surgery. Negative for palpitation, dizziness.  Positive for chest pain.  Negative for nausea, vomiting, diarrhea, constipation, GI bleed, stomach ulcer, hiatal hernia, hepatitis, or blood transfusion.  Negative for kidney stone, strokes, seizures or psychiatric admissions.  Positive for wearing reading glasses, dentures, history of hemoptysis with elevated blood pressure, history of noncompliance with medications, history of joint pains, and exertional dyspnea.  PHYSICAL EXAMINATION:  VITAL SIGNS:  Temperature 98, pulse 61, respirations 22, blood pressure 107/58, and oxygen saturation 98% on 2 liters of oxygen. GENERAL:  The patient is averagely built and nourished black male in no acute distress. HEENT:  The patient is normocephalic and atraumatic with brown eyes. Conjunctivae pink.  Sclerae nonicteric.  Pupils equally reacting to light.  Extraocular movement intact. NECK:  Positive JVD at 45-degree angle. LUNGS:  Few basilar crackles, otherwise unremarkable. HEART:  Normal S1 and S2 with grade 3/6 systolic murmur. ABDOMEN:  Soft and nontender. EXTREMITIES:  No edema, cyanosis, or clubbing. CNS: Cranial nerves grossly intact.  The patient moves all four extremities.  Has bilateral equal grips.  LABORATORY DATA:  Normal hemoglobin, hematocrit, WBC count, and platelet count.  MCV is slightly high at 100.5, INR was 0.97, troponin-I was less than 0.30.  Pro-BNP markedly elevated at 9836.  CMET essentially unremarkable except for BUN 31, creatinine 1.63, alkaline phosphatase borderline at 130, SGOT slightly high at 84 and SGPT 77, albumin borderline low at 3.4.    Chest x-ray showed stable cardiomegaly with pulmonary vascular congestion.  IMPRESSION: 1. Acute-on-chronic left heart systolic failure. 2. Dilated  cardiomyopathy. 3. Chronic obstructive lung disease. 4. Chronic kidney disease stage 2.  PLAN:  Plan is to admit the patient to telemetry bed, give IV Lasix, fluid restriction, continue home medications, refer for smoking cessation, and possible Dietary consult for ongoing heart failure.     Ricki Rodriguez, M.D.     ASK/MEDQ  D:  11/10/2010  T:  11/11/2010  Job:  [id number]  Electronically Signed by [name] M.D. on 11/14/2010 09:27:21 AM

## 2010-12-15 NOTE — Discharge Summary (Signed)
[name], [name]                 ACCOUNT NO.:  [id number]  MEDICAL RECORD NO.:  [id number]  LOCATION:  4710                         FACILITY:  [facility]  PHYSICIAN:  Ricki Rodriguez, M.D.  DATE OF BIRTH:  May 16, 1950  DATE OF ADMISSION:  11/10/2010 DATE OF DISCHARGE:  11/13/2010                              DISCHARGE SUMMARY   FINAL DIAGNOSES: 1. Acute on chronic left heart systolic failure. 2. Dilated cardiomyopathy. 3. Chronic obstructive lung disease. 4. Chronic kidney disease, stage II.  DISCHARGE MEDICATIONS: 1. Bystolic 5 mg 1 twice daily. 2. Amlodipine 5 mg daily. 3. Aspirin 325 mg daily. 4. BiDil 1 tablet 3 times daily. 5. Diltiazem 30 mg twice daily. 6. Lasix 40 mg in the morning. 7. Multivitamin 1 daily. 8. Pepto-Bismol 30 mg every 4 hours as needed. 9. Spironolactone 25 mg daily. 10.Tylenol Extra 500 mg 2 tablets every 6 hours as needed. 11.Zantac 150 mg daily.  DISCHARGE DIET:  Low-sodium heart healthy diet and 1500 mL fluid restriction per day.  DISCHARGE ACTIVITY:  The patient to increase activity gradually as tolerated.  FOLLOWUP:  By Dr. [name] in 2 weeks.  The patient to call [phone] for appointment.  HISTORY:  This 60 year old black male had a 2-day history of shortness of breath in spite of taking his medications.  The patient has a known history of dilated cardiomyopathy with echocardiogram 6 months ago showing LV ejection fraction of about 25-30%, diffuse hypokinesia, and severe mitral regurgitation.  PHYSICAL EXAMINATION:  VITAL SIGNS:  Temperature 98, pulse 61, respirations 22, blood pressure 107/58, oxygen saturation 98% on 2 L of oxygen. GENERAL:  The patient is averagely built and nourished black male in no acute distress. HEENT:  The patient is normocephalic, atraumatic, has brown eyes. Conjunctivae pink.  Sclerae nonicteric.  Pupils equally react to light. Extraocular movement intact. NECK:  Positive JVD at 45 degree  angle. LUNGS:  Few basilar crackles. HEART:  Normal S1, S2 with grade 3/6 systolic murmur. ABDOMEN:  Soft and nontender. EXTREMITIES:  No edema, cyanosis, or clubbing. CNS:  Cranial nerves grossly intact.  The patient moves all 4 extremities.  Has bilateral equal grips.  LABORATORY DATA:  Normal hemoglobin, hematocrit, WBC count, platelet count.  INR of 0.97.  Troponin I less than 0.30.  ProBNP markedly elevated at 9836.  CMET essentially unremarkable with a slightly high BUN of 31 and creatinine of 1.63, alkaline phosphatase slightly high at 130, SGOT slightly high at 84, and SGPT is slightly high at 77.  Albumin slightly low at 3.4.  CHEST X-RAY:  Stable cardiomegaly with pulmonary vascular congestion.  EKG:  Sinus rhythm with biatrial enlargement and intraventricular conduction delay with LVH secondary to repolarization changes.  HOSPITAL COURSE:  The patient was admitted to 4700 unit.  He was given IV Lasix with a slow and steady diuresis.  His condition improved in 72 hours of hospitalization.  He was offered additional cardiac workup butthe patient wants medical therapy for now.  Hence on November 13, 2010, he was discharged home in satisfactory condition with adjustment to his medications and followup by me in 2 weeks.     Ricki Rodriguez, M.D.  ASK/MEDQ  D:  12/10/2010  T:  12/10/2010  Job:  [id number]  Electronically Signed by [name] M.D. on 12/15/2010 08:49:27 AM

## 2011-01-31 ENCOUNTER — Inpatient Hospital Stay (HOSPITAL_COMMUNITY)
Admission: AD | Admit: 2011-01-31 | Discharge: 2011-02-03 | DRG: 287 | Disposition: A | Discharge status: Home / Self Care | Payer: Medicare Other | Attending: Cardiovascular Disease | Admitting: Cardiovascular Disease

## 2011-01-31 ENCOUNTER — Emergency Department (HOSPITAL_COMMUNITY): Payer: Medicare Other

## 2011-01-31 ENCOUNTER — Other Ambulatory Visit: Payer: Self-pay

## 2011-01-31 ENCOUNTER — Encounter: Payer: Self-pay | Admitting: Emergency Medicine

## 2011-01-31 DIAGNOSIS — I428 Other cardiomyopathies: Secondary | ICD-10-CM | POA: Diagnosis present

## 2011-01-31 DIAGNOSIS — I509 Heart failure, unspecified: Secondary | ICD-10-CM

## 2011-01-31 DIAGNOSIS — N2889 Other specified disorders of kidney and ureter: Secondary | ICD-10-CM | POA: Diagnosis present

## 2011-01-31 DIAGNOSIS — I129 Hypertensive chronic kidney disease with stage 1 through stage 4 chronic kidney disease, or unspecified chronic kidney disease: Secondary | ICD-10-CM | POA: Diagnosis present

## 2011-01-31 DIAGNOSIS — I5023 Acute on chronic systolic (congestive) heart failure: Principal | ICD-10-CM | POA: Diagnosis present

## 2011-01-31 DIAGNOSIS — N182 Chronic kidney disease, stage 2 (mild): Secondary | ICD-10-CM | POA: Diagnosis present

## 2011-01-31 DIAGNOSIS — I10 Essential (primary) hypertension: Secondary | ICD-10-CM | POA: Diagnosis present

## 2011-01-31 DIAGNOSIS — F17200 Nicotine dependence, unspecified, uncomplicated: Secondary | ICD-10-CM | POA: Diagnosis present

## 2011-01-31 DIAGNOSIS — I420 Dilated cardiomyopathy: Secondary | ICD-10-CM | POA: Diagnosis present

## 2011-01-31 HISTORY — DX: Gastro-esophageal reflux disease without esophagitis: K21.9

## 2011-01-31 HISTORY — DX: Diaphragmatic hernia without obstruction or gangrene: K44.9

## 2011-01-31 HISTORY — DX: Essential (primary) hypertension: I10

## 2011-01-31 LAB — POCT I-STAT TROPONIN I

## 2011-01-31 LAB — BASIC METABOLIC PANEL
BUN: 34 mg/dL — ABNORMAL HIGH (ref 6–23)
Calcium: 9.2 mg/dL (ref 8.4–10.5)
GFR calc Af Amer: 40 mL/min — ABNORMAL LOW (ref >90)
GFR calc non Af Amer: 35 mL/min — ABNORMAL LOW (ref >90)
Potassium: 4.1 mEq/L (ref 3.5–5.1)

## 2011-01-31 LAB — CBC
HCT: 44.2 % (ref 39.0–52.0)
MCHC: 34.6 g/dL (ref 30.0–36.0)
RDW: 13.2 % (ref 11.5–15.5)

## 2011-01-31 MED ORDER — ALPRAZOLAM 0.25 MG PO TABS
0.2500 mg | ORAL_TABLET | Freq: Two times a day (BID) | ORAL | Status: DC | PRN
  Administered 2011-02-01: 0.25 mg via ORAL
  Filled 2011-01-31: qty 1

## 2011-01-31 MED ORDER — SODIUM CHLORIDE 0.9 % IJ SOLN: 3.0000 mL | INTRAMUSCULAR | Status: DC | PRN

## 2011-01-31 MED ORDER — SODIUM CHLORIDE 0.9 % IV SOLN: 250.0000 mL | INTRAVENOUS | Status: DC | PRN

## 2011-01-31 MED ORDER — ONDANSETRON HCL 4 MG/2ML IJ SOLN: 4.0000 mg | Freq: Four times a day (QID) | INTRAMUSCULAR | Status: DC | PRN

## 2011-01-31 MED ORDER — HYDROMORPHONE HCL PF 1 MG/ML IJ SOLN
1.0000 mg | Freq: Once | INTRAMUSCULAR | Status: AC
  Administered 2011-01-31: 1 mg via INTRAVENOUS
  Filled 2011-01-31: qty 1

## 2011-01-31 MED ORDER — ISOSORB DINITRATE-HYDRALAZINE 20-37.5 MG PO TABS
1.0000 | ORAL_TABLET | Freq: Two times a day (BID) | ORAL | Status: DC
  Administered 2011-01-31 – 2011-02-01 (×4): 1 via ORAL
  Filled 2011-01-31 (×6): qty 1

## 2011-01-31 MED ORDER — OXYCODONE-ACETAMINOPHEN 5-325 MG PO TABS
1.0000 | ORAL_TABLET | ORAL | Status: DC | PRN
  Administered 2011-01-31 – 2011-02-02 (×6): 1 via ORAL
  Filled 2011-01-31 (×6): qty 1

## 2011-01-31 MED ORDER — MORPHINE SULFATE 4 MG/ML IJ SOLN
4.0000 mg | Freq: Once | INTRAMUSCULAR | Status: AC
  Administered 2011-01-31: 4 mg via INTRAVENOUS
  Filled 2011-01-31: qty 1

## 2011-01-31 MED ORDER — ONDANSETRON HCL 4 MG/2ML IJ SOLN
4.0000 mg | Freq: Once | INTRAMUSCULAR | Status: AC
  Administered 2011-01-31: 4 mg via INTRAVENOUS
  Filled 2011-01-31: qty 2

## 2011-01-31 MED ORDER — FUROSEMIDE 10 MG/ML IJ SOLN
40.0000 mg | Freq: Once | INTRAMUSCULAR | Status: AC
  Administered 2011-01-31: 40 mg via INTRAVENOUS
  Filled 2011-01-31: qty 4

## 2011-01-31 MED ORDER — ASPIRIN 325 MG PO TABS
325.0000 mg | ORAL_TABLET | Freq: Once | ORAL | Status: AC
  Administered 2011-01-31: 325 mg via ORAL
  Filled 2011-01-31: qty 1

## 2011-01-31 MED ORDER — ACETAMINOPHEN 325 MG PO TABS
650.0000 mg | ORAL_TABLET | ORAL | Status: DC | PRN
  Administered 2011-01-31: 650 mg via ORAL
  Filled 2011-01-31: qty 2

## 2011-01-31 MED ORDER — SODIUM CHLORIDE 0.9 % IJ SOLN
3.0000 mL | Freq: Two times a day (BID) | INTRAMUSCULAR | Status: DC
  Administered 2011-01-31 – 2011-02-01 (×3): 3 mL via INTRAVENOUS

## 2011-01-31 MED ORDER — HEPARIN BOLUS VIA INFUSION
4000.0000 [IU] | Freq: Once | INTRAVENOUS | Status: AC
  Administered 2011-01-31: 4000 [IU] via INTRAVENOUS
  Filled 2011-01-31: qty 4000

## 2011-01-31 MED ORDER — CARVEDILOL 3.125 MG PO TABS
3.1250 mg | ORAL_TABLET | Freq: Two times a day (BID) | ORAL | Status: DC
  Administered 2011-01-31 – 2011-02-03 (×6): 3.125 mg via ORAL
  Filled 2011-01-31 (×8): qty 1

## 2011-01-31 MED ORDER — HEPARIN SOD (PORCINE) IN D5W 100 UNIT/ML IV SOLN
1100.0000 [IU]/h | INTRAVENOUS | Status: DC
  Administered 2011-01-31: 1100 [IU]/h via INTRAVENOUS
  Administered 2011-01-31: 1000 [IU]/h via INTRAVENOUS
  Administered 2011-02-01: 1100 [IU]/h via INTRAVENOUS
  Filled 2011-01-31 (×5): qty 250

## 2011-01-31 MED ORDER — POTASSIUM CHLORIDE CRYS ER 10 MEQ PO TBCR
10.0000 meq | EXTENDED_RELEASE_TABLET | Freq: Every day | ORAL | Status: DC
  Administered 2011-01-31 – 2011-02-01 (×2): 10 meq via ORAL
  Filled 2011-01-31 (×3): qty 1

## 2011-01-31 MED ORDER — FUROSEMIDE 10 MG/ML IJ SOLN
40.0000 mg | Freq: Two times a day (BID) | INTRAMUSCULAR | Status: DC
  Administered 2011-01-31 – 2011-02-01 (×4): 40 mg via INTRAVENOUS
  Filled 2011-01-31 (×6): qty 4

## 2011-01-31 MED ORDER — NITROGLYCERIN 0.4 MG SL SUBL
0.4000 mg | SUBLINGUAL_TABLET | SUBLINGUAL | Status: DC | PRN
  Administered 2011-01-31: 0.4 mg via SUBLINGUAL
  Filled 2011-01-31: qty 25

## 2011-01-31 NOTE — ED Notes (Signed)
Patient reports pain in neck, epigastric and back pain denies chest pain but then states he is having heartburn. Requests pain medication and a blankets. Asked again if he was having any chest pain he states no only burning from the heart burn. Reports all symptoms started a couple days ago. Rates pain 7/10

## 2011-01-31 NOTE — ED Notes (Signed)
Critical i-STAT cTnl was shown to Dr. Dierdre Highman.

## 2011-01-31 NOTE — ED Notes (Signed)
PT. REPORTS SUBSTERNAL CHEST PAIN WITH SOB AND NAUSEA ONSET LAST NIGHT WITH OCCASIONAL DRY COUGH .

## 2011-01-31 NOTE — H&P (Signed)
[name] is an 60 y.o. male.   Chief Complaint: Chest pain, weakness and shortness of breath HPI: 60 years old black male with known history of dilated cardiomyopathy has 3 day history of bodyache, chest pain and shortness of breath.  Past Medical History  Diagnosis Date  . Hypertension   . GERD (gastroesophageal reflux disease)   . Hiatal hernia       Past Surgical History  Procedure Date  . Cardiac catheterization     No family history on file. Social History:  reports that he has been smoking.  He does not have any smokeless tobacco history on file. He reports that he drinks alcohol. He reports that he does not use illicit drugs.  Allergies: Allergies not on file  Medications Prior to Admission  Medication Dose Route Frequency Provider Last Rate Last Dose  . aspirin tablet 325 mg  325 mg Oral Once Russell Nielsen, MD   325 mg at 01/31/11 0135  . furosemide (LASIX) injection 40 mg  40 mg Intravenous Once Russell Nielsen, MD      . heparin ADULT infusion 100 units/mL (25000 units/250 mL)  1,000 Units/hr Intravenous Continuous Russell Nielsen, MD 10 mL/hr at 01/31/11 0439 1,000 Units/hr at 01/31/11 0439  . heparin bolus via infusion 4,000 Units  4,000 Units Intravenous Once Russell Nielsen, MD   4,000 Units at 01/31/11 0443  . HYDROmorphone (DILAUDID) injection 1 mg  1 mg Intravenous Once Russell Nielsen, MD   1 mg at 01/31/11 0459  . morphine 4 MG/ML injection 4 mg  4 mg Intravenous Once Russell Nielsen, MD   4 mg at 01/31/11 9604  . nitroGLYCERIN (NITROSTAT) SL tablet 0.4 mg  0.4 mg Sublingual Q5 min PRN Russell Nielsen, MD   0.4 mg at 01/31/11 0208  . ondansetron (ZOFRAN) injection 4 mg  4 mg Intravenous Once Russell Nielsen, MD   4 mg at 01/31/11 5409   No current outpatient prescriptions on file as of 01/31/2011.    Results for orders placed during the hospital encounter of 01/31/11 (from the past 48 hour(s))  CBC     Status: Abnormal   Collection Time   01/31/11  1:11 AM      Component Value Range  Comment   WBC 6.6  4.0 - 10.5 (K/uL)    RBC 4.38  4.22 - 5.81 (MIL/uL)    Hemoglobin 15.3  13.0 - 17.0 (g/dL)    HCT 81.1  91.4 - 78.2 (%)    MCV 100.9 (*) 78.0 - 100.0 (fL)    MCH 34.9 (*) 26.0 - 34.0 (pg)    MCHC 34.6  30.0 - 36.0 (g/dL)    RDW 95.6  21.3 - 08.6 (%)    Platelets 205  150 - 400 (K/uL)   BASIC METABOLIC PANEL     Status: Abnormal   Collection Time   01/31/11  1:11 AM      Component Value Range Comment   Sodium 137  135 - 145 (mEq/L)    Potassium 4.1  3.5 - 5.1 (mEq/L)    Chloride 107  96 - 112 (mEq/L)    CO2 17 (*) 19 - 32 (mEq/L)    Glucose, Bld 105 (*) 70 - 99 (mg/dL)    BUN 34 (*) 6 - 23 (mg/dL)    Creatinine, Ser 5.78 (*) 0.50 - 1.35 (mg/dL)    Calcium 9.2  8.4 - 10.5 (mg/dL)    GFR calc non Af Amer 35 (*) >90 (mL/min)  GFR calc Af Amer 40 (*) >90 (mL/min)   PROTIME-INR     Status: Abnormal   Collection Time   01/31/11  1:11 AM      Component Value Range Comment   Prothrombin Time 15.6 (*) 11.6 - 15.2 (seconds)    INR 1.21  0.00 - 1.49    POCT I-STAT TROPONIN I     Status: Abnormal   Collection Time   01/31/11  1:23 AM      Component Value Range Comment   Troponin i, poc 0.11 (*) 0.00 - 0.08 (ng/mL)    Comment NOTIFIED PHYSICIAN      Comment 3            PRO B NATRIURETIC PEPTIDE     Status: Abnormal   Collection Time   01/31/11  4:00 AM      Component Value Range Comment   Pro B Natriuretic peptide (BNP) 16160.0 (*) 0 - 125 (pg/mL)    Dg Chest 2 View  01/31/2011  *RADIOLOGY REPORT*  Clinical Data: Chest pain, shortness of breath.  CHEST - 2 VIEW  Comparison: 11/10/2010  Findings: Cardiomegaly, central vascular congestion.  Mild vascular cephalization.  No overt interstitial prominence. No pneumothorax or pleural effusion.  Right hemidiaphragm elevation/eventration. Stable appearance to the spine with no acute osseous abnormality identified.  IMPRESSION: Cardiomegaly with central vascular congestion and cephalization. No focal consolidation.   Original Report Authenticated By: Waneta Martins, M.D.    Review of Systems  Constitutional: Negative for fever and chills.  HENT: Negative for neck pain and neck stiffness.  Eyes: Negative for pain.  Respiratory: Positive for shortness of breath.  Cardiovascular: Positive for chest pain.  Gastrointestinal: Negative for abdominal pain.  Genitourinary: Negative for dysuria.  Musculoskeletal: Positive for back pain.  Skin: Negative for rash.  Neurological: Negative for headaches.  All other systems reviewed and are negative.   Blood pressure 138/101, pulse 60, temperature 97.4 F (36.3 C), temperature source Oral, resp. rate 16, SpO2 100.00%. Physical Exam  Constitutional: He is oriented to person, place, and time. He appears well-developed and well-nourished.  Head: Normocephalic and atraumatic.  Eyes: Manson Passey. Conjunctivae and EOM are normal. Pupils are equal, round, and reactive to light.  Neck: Trachea normal. Neck supple. No thyromegaly present. + JVD Cardiovascular: Normal rate, regular rhythm, S1 normal, S2 normal and normal pulses.  II/VI systolic murmur is present  Pulses: Radial pulses are 2+ on the right side, and 2+ on the left side.  Pulmonary/Chest: Effort normal and breath sounds normal. He has no wheezes. He has no rhonchi. He has no rales. He exhibits no tenderness.  Abdominal: Soft. Normal appearance and bowel sounds are normal. There is no tenderness. There is no CVA tenderness and negative Murphy's sign.  Musculoskeletal: BLE:s Calves nontender, no cords or erythema, negative Homans sign  Neurological: He is alert and oriented to person, place, and time. He has normal strength. No cranial nerve deficit or sensory deficit.  Skin: Skin is warm and dry. No rash noted. He is not diaphoretic.  Psychiatric: His speech is normal. Cooperative and appropriate    Assessment 1.Chest pain 2. Acute on chronic left heart systolic failure. 3. Hypertension. 4. Dilated  cardiomyopathy. 5. Chronic renal insufficiency.  Plan Admit/Telebed R/O MI,  IV lasix, Oxygen 2-D echo  [facility] S 01/31/2011, 5:36 AM

## 2011-01-31 NOTE — ED Provider Notes (Signed)
History     CSN: [id number] Arrival date & time: 01/31/2011 12:51 AM   First MD Initiated Contact with Patient 01/31/11 0121      Chief Complaint  Patient presents with  . Chest Pain    CP, mentions heart condition, hiatal hernia and knee pain    (Consider location/radiation/quality/duration/timing/severity/associated sxs/prior treatment) The history is provided by the patient.   location epigastric and substernal, associated with shortness of breath. Started about 24 hours ago saw his cardiologist in clinic today. He was offered admission but was not feeling bad at that time decided to go home. Symptoms worsened tonight and presents here for evaluation. Pain described as "feels like heartburn". No radiation. Constant since onset with waxing and waning. Moderate in severity. Has had similar symptoms in the past that he attributes to heartburn. last cardiac cath was about 2 years ago and was told he had clean coronary arteries  Past Medical History  Diagnosis Date  . Hypertension   . GERD (gastroesophageal reflux disease)   . Hiatal hernia     Past Surgical History  Procedure Date  . Cardiac catheterization     No family history on file.  History  Substance Use Topics  . Smoking status: Current Everyday Smoker  . Smokeless tobacco: Not on file  . Alcohol Use: Yes      Review of Systems  Constitutional: Negative for fever and chills.  HENT: Negative for neck pain and neck stiffness.   Eyes: Negative for pain.  Respiratory: Positive for shortness of breath.   Cardiovascular: Positive for chest pain.  Gastrointestinal: Negative for abdominal pain.  Genitourinary: Negative for dysuria.  Musculoskeletal: Positive for back pain.  Skin: Negative for rash.  Neurological: Negative for headaches.  All other systems reviewed and are negative.    Allergies  Review of patient's allergies indicates not on file.  Home Medications   Current Outpatient Rx  Name Route Sig  Dispense Refill  . ASPIRIN EC 81 MG PO TBEC Oral Take 81 mg by mouth daily.      Marland Kitchen DILTIAZEM HCL 30 MG PO TABS Oral Take 30 mg by mouth 2 (two) times daily.      . FUROSEMIDE 40 MG PO TABS Oral Take 40 mg by mouth daily.      . ISOSORB DINITRATE-HYDRALAZINE 20-37.5 MG PO TABS Oral Take 1 tablet by mouth 3 (three) times daily.      Carma Leaven M PLUS PO TABS Oral Take 1 tablet by mouth at bedtime.      . NEBIVOLOL HCL 10 MG PO TABS Oral Take 10 mg by mouth daily after breakfast.     . NEBIVOLOL HCL 5 MG PO TABS Oral Take 5 mg by mouth at bedtime.     Marland Kitchen OVER THE COUNTER MEDICATION  Vegetarian Capsule 100% dried moringa leaf powder HOLD WHILE IN HOSPITAL     . RANITIDINE HCL 150 MG PO CAPS Oral Take 150 mg by mouth 2 (two) times daily.      Marland Kitchen SPIRONOLACTONE 25 MG PO TABS Oral Take by mouth daily.        BP 127/94  Pulse 55  Temp(Src) 97.4 F (36.3 C) (Oral)  Resp 16  SpO2 99%  Physical Exam  Constitutional: He is oriented to person, place, and time. He appears well-developed and well-nourished.  HENT:  Head: Normocephalic and atraumatic.  Eyes: Conjunctivae and EOM are normal. Pupils are equal, round, and reactive to light.  Neck: Trachea normal. Neck supple. No  thyromegaly present.  Cardiovascular: Normal rate, regular rhythm, S1 normal, S2 normal and normal pulses.     No systolic murmur is present   No diastolic murmur is present  Pulses:      Radial pulses are 2+ on the right side, and 2+ on the left side.  Pulmonary/Chest: Effort normal and breath sounds normal. He has no wheezes. He has no rhonchi. He has no rales. He exhibits no tenderness.  Abdominal: Soft. Normal appearance and bowel sounds are normal. There is no tenderness. There is no CVA tenderness and negative Murphy's sign.  Musculoskeletal:       BLE:s Calves nontender, no cords or erythema, negative Homans sign  Neurological: He is alert and oriented to person, place, and time. He has normal strength. No cranial nerve  deficit or sensory deficit. GCS eye subscore is 4. GCS verbal subscore is 5. GCS motor subscore is 6.  Skin: Skin is warm and dry. No rash noted. He is not diaphoretic.  Psychiatric: His speech is normal.       Cooperative and appropriate    ED Course  Procedures (including critical care time)  Labs Reviewed  CBC - Abnormal; Notable for the following:    MCV 100.9 (*)    MCH 34.9 (*)    All other components within normal limits  BASIC METABOLIC PANEL - Abnormal; Notable for the following:    CO2 17 (*)    Glucose, Bld 105 (*)    BUN 34 (*)    Creatinine, Ser 2.00 (*)    GFR calc non Af Amer 35 (*)    GFR calc Af Amer 40 (*)    All other components within normal limits  PROTIME-INR - Abnormal; Notable for the following:    Prothrombin Time 15.6 (*)    All other components within normal limits  POCT I-STAT TROPONIN I - Abnormal; Notable for the following:    Troponin i, poc 0.11 (*)    All other components within normal limits  I-STAT TROPONIN I  PRO B NATRIURETIC PEPTIDE   Dg Chest 2 View  01/31/2011  *RADIOLOGY REPORT*  Clinical Data: Chest pain, shortness of breath.  CHEST - 2 VIEW  Comparison: 11/10/2010  Findings: Cardiomegaly, central vascular congestion.  Mild vascular cephalization.  No overt interstitial prominence. No pneumothorax or pleural effusion.  Right hemidiaphragm elevation/eventration. Stable appearance to the spine with no acute osseous abnormality identified.  IMPRESSION: Cardiomegaly with central vascular congestion and cephalization. No focal consolidation.  Original Report Authenticated By: Waneta Martins, M.D.      Date: 01/31/2011  Rate: 57  Rhythm: sinus bradycardia  QRS Axis: left  Intervals: normal  ST/T Wave abnormalities: nonspecific ST/T changes  Conduction Disutrbances:right bundle branch block  Narrative Interpretation:   Old EKG Reviewed: unchanged  Chest x-ray, EKG, labs, aspirin, nitroglycerin, pain control IV narcotics  Case  discussed as above with Dr. Algie Coffer, noting elevated troponin, he agrees to admission.  PT started on heparin, BNP requested and plan admit tele.  MDM  Chest pain shortness of breath in a patient with history of cardiomyopathy. Old records reviewed last EF 30-35%. IV heparin drip for elevated troponin and EKG does not demonstrate any ST elevations.     Sunnie Nielsen, MD 01/31/11 [phone]

## 2011-01-31 NOTE — Progress Notes (Signed)
  Echocardiogram 2D Echocardiogram has been performed.  Brigida Scotti, Real Cons 01/31/2011, 3:43 PM

## 2011-01-31 NOTE — Progress Notes (Signed)
ANTICOAGULATION CONSULT NOTE - Initial Consult  Pharmacy Consult for Heparin  Indication: chest pain/ACS  No Known Allergies  Patient Measurements: Height: 5\' 9"  (175.3 cm) Weight: 146 lb 8 oz (66.452 kg) (scale b) IBW/kg (Calculated) : 70.7    Vital Signs: Temp: 97.2 F (36.2 C) (12/15 1511) Temp src: Oral (12/15 1511) BP: 115/77 mmHg (12/15 1511) Pulse Rate: 54  (12/15 1511)  Labs:  Basename 01/31/11 1230 01/31/11 0111  HGB -- 15.3  HCT -- 44.2  PLT -- 205  APTT -- --  LABPROT -- 15.6*  INR -- 1.21  HEPARINUNFRC 0.21* --  CREATININE -- 2.00*  CKTOTAL -- --  CKMB -- --  TROPONINI -- --   Estimated Creatinine Clearance: 36.9 ml/min (by C-G formula based on Cr of 2).  Medical History: Past Medical History  Diagnosis Date  . Hypertension   . GERD (gastroesophageal reflux disease)   . Hiatal hernia       Asessment: Pt admitted with CP/SOB with HF history.  Heparin started in ED bolus 4000 uts and drip at 1000 uts/hr.  HL 0.21 < goal 0.3-0.7.  CBC stable.  No bleeding noted.   Goal of Therapy:  Heparin level 0.3-0.7 units/ml   Plan:  Increase Heaprin drip 1100 uts/hr  Daily CBC and HL Marcelino Scot 01/31/2011,5:00 PM

## 2011-02-01 ENCOUNTER — Other Ambulatory Visit: Payer: Self-pay

## 2011-02-01 LAB — CARDIAC PANEL(CRET KIN+CKTOT+MB+TROPI)
CK, MB: 7.7 ng/mL (ref 0.3–4.0)
CK, MB: 6.4 ng/mL (ref 0.3–4.0)
Relative Index: 6.5 — ABNORMAL HIGH (ref 0.0–2.5)
Relative Index: 6.2 — ABNORMAL HIGH (ref 0.0–2.5)
Total CK: 118 U/L (ref 7–232)
Total CK: 88 U/L (ref 7–232)

## 2011-02-01 LAB — BASIC METABOLIC PANEL
BUN: 34 mg/dL — ABNORMAL HIGH (ref 6–23)
CO2: 22 mEq/L (ref 19–32)
Calcium: 9.2 mg/dL (ref 8.4–10.5)
Chloride: 102 mEq/L (ref 96–112)
Creatinine, Ser: 2.08 mg/dL — ABNORMAL HIGH (ref 0.50–1.35)

## 2011-02-01 MED ORDER — SODIUM CHLORIDE 0.9 % IJ SOLN: 3.0000 mL | INTRAMUSCULAR | Status: DC | PRN

## 2011-02-01 MED ORDER — ASPIRIN 81 MG PO CHEW
324.0000 mg | CHEWABLE_TABLET | ORAL | Status: AC
  Administered 2011-02-02: 324 mg via ORAL
  Filled 2011-02-01: qty 4

## 2011-02-01 MED ORDER — SODIUM CHLORIDE 0.9 % IJ SOLN
3.0000 mL | Freq: Two times a day (BID) | INTRAMUSCULAR | Status: DC
  Administered 2011-02-01 – 2011-02-02 (×2): 3 mL via INTRAVENOUS

## 2011-02-01 MED ORDER — CLOPIDOGREL BISULFATE 75 MG PO TABS
75.0000 mg | ORAL_TABLET | ORAL | Status: AC
  Administered 2011-02-02: 75 mg via ORAL
  Filled 2011-02-01 (×2): qty 1

## 2011-02-01 MED ORDER — SODIUM CHLORIDE 0.9 % IV SOLN: 250.0000 mL | INTRAVENOUS | Status: DC | PRN

## 2011-02-01 MED ORDER — DIAZEPAM 5 MG PO TABS
5.0000 mg | ORAL_TABLET | ORAL | Status: AC
  Administered 2011-02-02: 5 mg via ORAL
  Filled 2011-02-01: qty 1

## 2011-02-01 MED ORDER — CLOPIDOGREL BISULFATE 75 MG PO TABS
75.0000 mg | ORAL_TABLET | ORAL | Status: DC
  Filled 2011-02-01: qty 1

## 2011-02-01 MED ORDER — SODIUM CHLORIDE 0.9 % IV SOLN
INTRAVENOUS | Status: DC
  Administered 2011-02-02: 06:00:00 via INTRAVENOUS

## 2011-02-01 NOTE — Progress Notes (Signed)
Subjective:  Feeling better. No chest pain.  Objective:  Vital Signs in the last 24 hours: Temp:  [97.2 F (36.2 C)-97.9 F (36.6 C)] 97.8 F (36.6 C) (12/16 0534) Pulse Rate:  [49-54] 53  (12/16 0534) Cardiac Rhythm:  [-] Sinus bradycardia (12/15 2015) Resp:  [18] 18  (12/16 0534) BP: (104-124)/(62-81) 120/80 mmHg (12/16 0534) SpO2:  [98 %-100 %] 99 % (12/16 0534) Weight:  [66.5 kg (146 lb 9.7 oz)] 146 lb 9.7 oz (66.5 kg) (12/16 0534)  Physical Exam: BP Readings from Last 1 Encounters:  02/01/11 120/80    Wt Readings from Last 1 Encounters:  02/01/11 66.5 kg (146 lb 9.7 oz)    Weight change:   HEENT: [place]/AT, Eyes-Brown, PERL, EOMI, Conjunctiva-Pink, Sclera-Non-icteric Neck: + JVD, No bruit, Trachea midline. Lungs:  Clear, Bilateral. Cardiac:  Regular rhythm, normal S1 and S2, no S3.  Abdomen:  Soft, non-tender. Extremities:  No edema present. No cyanosis. No clubbing. CNS: AxOx3, Cranial nerves grossly intact, moves all 4 extremities. Right handed. Skin: Warm and dry.   Intake/Output from previous day: 12/15 0701 - 12/16 0700 In: 1936 [P.O.:1680; I.V.:252; IV Piggyback:4] Out: 2650 [Urine:2650]    Lab Results: BMET    Component Value Date/Time   NA 137 01/31/2011 0111   K 4.1 01/31/2011 0111   CL 107 01/31/2011 0111   CO2 17* 01/31/2011 0111   GLUCOSE 105* 01/31/2011 0111   BUN 34* 01/31/2011 0111   CREATININE 2.00* 01/31/2011 0111   CALCIUM 9.2 01/31/2011 0111   GFRNONAA 35* 01/31/2011 0111   GFRAA 40* 01/31/2011 0111   CBC    Component Value Date/Time   WBC 6.6 01/31/2011 0111   RBC 4.38 01/31/2011 0111   HGB 15.3 01/31/2011 0111   HCT 44.2 01/31/2011 0111   PLT 205 01/31/2011 0111   MCV 100.9* 01/31/2011 0111   MCH 34.9* 01/31/2011 0111   MCHC 34.6 01/31/2011 0111   RDW 13.2 01/31/2011 0111   LYMPHSABS 1.2 11/10/2010 1019   MONOABS 0.7 11/10/2010 1019   EOSABS 0.3 11/10/2010 1019   BASOSABS 0.1 11/10/2010 1019   CARDIAC ENZYMES Lab Results    Component Value Date   CKTOTAL 88 02/01/2011   CKMB 6.4* 02/01/2011   TROPONINI <0.30 02/01/2011    Assessment/Plan:  Patient Active Hospital Problem List: Acute on chronic systolic heart failure (01/31/2011)   Assessment:Improving   Plan:Continue diuresis Dilated cardiomyopathy (01/31/2011)   Assessment: Stable   Plan: Medications Hypertension, benign (01/31/2011)   Assessment:Stable   Plan: Medications Chronic renal insufficiency, stage II (mild) (01/31/2011)   Assessment:Stable   Plan: Medications    LOS: 1 day    [name]  MD  02/01/2011, 10:42 AM

## 2011-02-01 NOTE — Progress Notes (Signed)
CRITICAL VALUE ALERT  Critical value received:  CKMB 7.7  Date of notification: 02/01/11  Time of notification:  0150  Critical value read back:yes  Nurse who received alert:  Bartholomew Boards   MD notified (1st page):  Dr. Algie Coffer  Time of first page:  0151  MD notified (2nd page):  Time of second page:  Responding MD:  Dr. Algie Coffer  Time MD responded:  [phone]

## 2011-02-01 NOTE — Progress Notes (Signed)
ANTICOAGULATION CONSULT NOTE - Follow Up Consult  Pharmacy Consult for IV heparin Indication: chest pain/ACS  No Known Allergies  Patient Measurements: Height: 5\' 9"  (175.3 cm) Weight: 146 lb 9.7 oz (66.5 kg) (scale b) IBW/kg (Calculated) : 70.7   Vital Signs: Temp: 97.8 F (36.6 C) (12/16 1359) Temp src: Oral (12/16 1359) BP: 127/79 mmHg (12/16 1359) Pulse Rate: 54  (12/16 1359)  Labs:  Basename 02/01/11 1600 02/01/11 1049 02/01/11 1044 02/01/11 0314 01/31/11 2042 01/31/11 1230 01/31/11 0111  HGB -- -- -- -- -- -- 15.3  HCT -- -- -- -- -- -- 44.2  PLT -- -- -- -- -- -- 205  APTT -- -- -- -- -- -- --  LABPROT -- -- -- -- -- -- 15.6*  INR -- -- -- -- -- -- 1.21  HEPARINUNFRC 0.33 -- -- -- -- 0.21* --  CREATININE -- 2.08* -- -- -- -- 2.00*  CKTOTAL -- -- 105 88 118 -- --  CKMB -- -- 6.5* 6.4* 7.7* -- --  TROPONINI -- -- <0.30 <0.30 <0.30 -- --   Estimated Creatinine Clearance: 35.5 ml/min (by C-G formula based on Cr of 2.08).   Medications:  Infusions:    . sodium chloride    . heparin 1,100 Units/hr (02/01/11 1526)    Assessment: 60 year old with CP/SOB on IV heparin. Heparin level is therapeutic for goal. No bleeding reported.   Goal of Therapy:  Heparin level 0.3-0.7 units/ml   Plan:  1. Continue heparin at current rate of 1100units/hr (61ml/hr).  2. Follow up daily heparin level in am.   Fayne Norrie 02/01/2011,4:58 PM

## 2011-02-02 ENCOUNTER — Encounter (HOSPITAL_COMMUNITY)
Admission: AD | Disposition: A | Discharge status: Home / Self Care | Payer: Self-pay | Source: Home / Self Care | Attending: Cardiovascular Disease

## 2011-02-02 HISTORY — PX: LEFT HEART CATHETERIZATION WITH CORONARY ANGIOGRAM: SHX5451

## 2011-02-02 LAB — BASIC METABOLIC PANEL
BUN: 30 mg/dL — ABNORMAL HIGH (ref 6–23)
Chloride: 101 mEq/L (ref 96–112)
GFR calc Af Amer: 43 mL/min — ABNORMAL LOW (ref >90)
GFR calc non Af Amer: 37 mL/min — ABNORMAL LOW (ref >90)
Potassium: 3.6 mEq/L (ref 3.5–5.1)
Sodium: 134 mEq/L — ABNORMAL LOW (ref 135–145)

## 2011-02-02 LAB — POCT ACTIVATED CLOTTING TIME: Activated Clotting Time: 105 seconds

## 2011-02-02 SURGERY — LEFT HEART CATHETERIZATION WITH CORONARY ANGIOGRAM
Anesthesia: Moderate Sedation

## 2011-02-02 MED ORDER — FUROSEMIDE 40 MG PO TABS
40.0000 mg | ORAL_TABLET | Freq: Every day | ORAL | Status: DC
  Administered 2011-02-02 – 2011-02-03 (×2): 40 mg via ORAL
  Filled 2011-02-02 (×2): qty 1

## 2011-02-02 MED ORDER — ISOSORB DINITRATE-HYDRALAZINE 20-37.5 MG PO TABS
1.0000 | ORAL_TABLET | Freq: Three times a day (TID) | ORAL | Status: DC
  Administered 2011-02-02 – 2011-02-03 (×3): 1 via ORAL
  Filled 2011-02-02 (×5): qty 1

## 2011-02-02 MED ORDER — SODIUM CHLORIDE 0.9 % IV SOLN: 250.0000 mL | INTRAVENOUS | Status: DC | PRN

## 2011-02-02 MED ORDER — MIDAZOLAM HCL 2 MG/2ML IJ SOLN
INTRAMUSCULAR | Status: AC
  Filled 2011-02-02: qty 2

## 2011-02-02 MED ORDER — THERA M PLUS PO TABS
1.0000 | ORAL_TABLET | Freq: Every day | ORAL | Status: DC
  Administered 2011-02-02: 1 via ORAL
  Filled 2011-02-02 (×2): qty 1

## 2011-02-02 MED ORDER — SODIUM CHLORIDE 0.9 % IJ SOLN
3.0000 mL | Freq: Two times a day (BID) | INTRAMUSCULAR | Status: DC
  Administered 2011-02-03: 3 mL via INTRAVENOUS

## 2011-02-02 MED ORDER — SODIUM CHLORIDE 0.9 % IJ SOLN: 3.0000 mL | INTRAMUSCULAR | Status: DC | PRN

## 2011-02-02 MED ORDER — NITROGLYCERIN 0.2 MG/ML ON CALL CATH LAB
INTRAVENOUS | Status: AC
  Filled 2011-02-02: qty 1

## 2011-02-02 MED ORDER — ACETAMINOPHEN 325 MG PO TABS: 650.0000 mg | ORAL_TABLET | ORAL | Status: DC | PRN

## 2011-02-02 MED ORDER — ASPIRIN 81 MG PO CHEW: 81.0000 mg | CHEWABLE_TABLET | Freq: Every day | ORAL | Status: DC

## 2011-02-02 MED ORDER — DILTIAZEM HCL 30 MG PO TABS
30.0000 mg | ORAL_TABLET | Freq: Two times a day (BID) | ORAL | Status: DC
  Administered 2011-02-02 – 2011-02-03 (×3): 30 mg via ORAL
  Filled 2011-02-02 (×4): qty 1

## 2011-02-02 MED ORDER — FENTANYL CITRATE 0.05 MG/ML IJ SOLN
INTRAMUSCULAR | Status: AC
  Filled 2011-02-02: qty 2

## 2011-02-02 MED ORDER — FAMOTIDINE 20 MG PO TABS
20.0000 mg | ORAL_TABLET | Freq: Two times a day (BID) | ORAL | Status: DC
  Administered 2011-02-02 – 2011-02-03 (×3): 20 mg via ORAL
  Filled 2011-02-02 (×4): qty 1

## 2011-02-02 MED ORDER — NEBIVOLOL HCL 5 MG PO TABS: 5.0000 mg | ORAL_TABLET | Freq: Every day | ORAL | Status: DC

## 2011-02-02 MED ORDER — HEPARIN (PORCINE) IN NACL 2-0.9 UNIT/ML-% IJ SOLN
INTRAMUSCULAR | Status: AC
  Filled 2011-02-02: qty 2000

## 2011-02-02 MED ORDER — LIDOCAINE HCL (PF) 1 % IJ SOLN
INTRAMUSCULAR | Status: AC
  Filled 2011-02-02: qty 30

## 2011-02-02 MED ORDER — SPIRONOLACTONE 25 MG PO TABS
25.0000 mg | ORAL_TABLET | Freq: Every day | ORAL | Status: DC
  Administered 2011-02-02 – 2011-02-03 (×2): 25 mg via ORAL
  Filled 2011-02-02 (×2): qty 1

## 2011-02-02 MED ORDER — ASPIRIN EC 81 MG PO TBEC
81.0000 mg | DELAYED_RELEASE_TABLET | Freq: Every day | ORAL | Status: DC
  Administered 2011-02-03: 81 mg via ORAL
  Filled 2011-02-02: qty 1

## 2011-02-02 MED ORDER — OXYCODONE-ACETAMINOPHEN 5-325 MG PO TABS
1.0000 | ORAL_TABLET | ORAL | Status: DC | PRN
  Administered 2011-02-02: 1 via ORAL
  Administered 2011-02-03: 2 via ORAL
  Filled 2011-02-02: qty 1
  Filled 2011-02-02: qty 2

## 2011-02-02 MED ORDER — ONDANSETRON HCL 4 MG/2ML IJ SOLN: 4.0000 mg | Freq: Four times a day (QID) | INTRAMUSCULAR | Status: DC | PRN

## 2011-02-02 MED ORDER — NEBIVOLOL HCL 10 MG PO TABS
10.0000 mg | ORAL_TABLET | Freq: Every day | ORAL | Status: DC
Start: 2011-02-03 — End: 2011-02-03
  Administered 2011-02-03: 10 mg via ORAL
  Filled 2011-02-02 (×2): qty 1

## 2011-02-02 MED ORDER — POTASSIUM CHLORIDE 20 MEQ/15ML (10%) PO LIQD
20.0000 meq | Freq: Two times a day (BID) | ORAL | Status: DC
  Filled 2011-02-02 (×2): qty 15

## 2011-02-02 NOTE — Progress Notes (Signed)
Subjective:  Feeling better. No chest pain. For Cardiac cath today.  Objective:  Vital Signs in the last 24 hours: Temp:  [97.4 F (36.3 C)-97.8 F (36.6 C)] 97.4 F (36.3 C) (12/17 0200) Pulse Rate:  [52-54] 54  (12/17 0200) Cardiac Rhythm:  [-] Sinus bradycardia (12/16 2030) Resp:  [18-19] 18  (12/17 0200) BP: (118-127)/(64-79) 126/79 mmHg (12/17 0200) SpO2:  [94 %-97 %] 96 % (12/17 0200)  Physical Exam: BP Readings from Last 1 Encounters:  02/02/11 126/79    Wt Readings from Last 1 Encounters:  02/01/11 66.5 kg (146 lb 9.7 oz)    Weight change:   HEENT: [place]/AT, Eyes-Brown, PERL, EOMI, Conjunctiva-Pink, Sclera-Non-icteric Neck: + JVD, No bruit, Trachea midline. Lungs:  Clear, Bilateral. Cardiac:  Regular rhythm, normal S1 and S2, no S3.  Abdomen:  Soft, non-tender. Extremities:  No edema present. No cyanosis. No clubbing. CNS: AxOx3, Cranial nerves grossly intact, moves all 4 extremities. Right handed. Skin: Warm and dry.   Intake/Output from previous day: 12/16 0701 - 12/17 0700 In: 1456 [P.O.:1200; I.V.:252; IV Piggyback:4] Out: 5280 [Urine:5280]    Lab Results: BMET    Component Value Date/Time   NA 135 02/01/2011 1049   K 3.3* 02/01/2011 1049   CL 102 02/01/2011 1049   CO2 22 02/01/2011 1049   GLUCOSE 136* 02/01/2011 1049   BUN 34* 02/01/2011 1049   CREATININE 2.08* 02/01/2011 1049   CALCIUM 9.2 02/01/2011 1049   GFRNONAA 33* 02/01/2011 1049   GFRAA 38* 02/01/2011 1049   CBC    Component Value Date/Time   WBC 6.6 01/31/2011 0111   RBC 4.38 01/31/2011 0111   HGB 15.3 01/31/2011 0111   HCT 44.2 01/31/2011 0111   PLT 205 01/31/2011 0111   MCV 100.9* 01/31/2011 0111   MCH 34.9* 01/31/2011 0111   MCHC 34.6 01/31/2011 0111   RDW 13.2 01/31/2011 0111   LYMPHSABS 1.2 11/10/2010 1019   MONOABS 0.7 11/10/2010 1019   EOSABS 0.3 11/10/2010 1019   BASOSABS 0.1 11/10/2010 1019   CARDIAC ENZYMES Lab Results  Component Value Date   CKTOTAL 105 02/01/2011   CKMB 6.5* 02/01/2011   TROPONINI <0.30 02/01/2011    Assessment/Plan:  Patient Active Hospital Problem List: Acute on chronic systolic heart failure (01/31/2011)   Assessment:Improving   Plan:Continue diuresis Dilated cardiomyopathy (01/31/2011)   Assessment: Stable   Plan: Medications Hypertension, benign (01/31/2011)   Assessment:Stable   Plan: Medications Chronic renal insufficiency, stage II (mild) (01/31/2011)   Assessment:Stable   Plan: Medications    LOS: 2 days    [name]  MD  02/02/2011, 7:38 AM

## 2011-02-02 NOTE — Plan of Care (Signed)
Problem: Limited Adherence to Nutrition-Related Recommendations (NB-1.6) Goal: Nutrition education Formal process to instruct or train a patient/client in a skill or to impart knowledge to help patients/clients voluntarily manage or modify food choices and eating behavior to maintain or improve health.  Outcome: Completed/Met Date Met:  02/02/11 Pt reports having had previous Heart Healthy diet education. Pt was able to verbalize basic goals and strategies to achieve the goal. Pt also admits that he continues to "sprinkle" his food with salt at the table. Pt was provided with [employer] of [employer] Heart Healthy hand outs and RD contact information. Diet education goals met.

## 2011-02-02 NOTE — Op Note (Signed)
PROCEDURE:  Left heart catheterization with selective coronary angiography.  CLINICAL HISTORY:  This is a 59 years old black male with recurrent chest pain and systolic heart failure with abnormal cardiac enzymes..  The risks, benefits, and details of the procedure were explained to the patient.  The patient verbalized understanding and wanted to proceed.  Informed written consent was obtained.  PROCEDURE TECHNIQUE:  The patient was approached from the right femoral artery using a 5 French short sheath.  Left coronary angiography was done using a Judkins L4 guide catheter.  Right coronary angiography was done using a Judkins R4 guide catheter.  Left heart pressures were done using a pigtail catheter.    CONTRAST:  Total of 30 cc.  COMPLICATIONS:  None.  At the end of the procedure a pressure device was used for hemostasis.    HEMODYNAMICS:  Aortic pressure was 140/90; LV pressure was 140/8; LVEDP 15.  There was no gradient between the left ventricle and aorta.    ANGIOGRAM/CORONARY ARTERIOGRAM:   The left main coronary artery is normal.  The left anterior descending artery and diagonal are unremarkable but slender vessels.  The left circumflex artery is unremarkable..  The right coronary artery is dominant and unremarkable.  LEFT VENTRICULOGRAM: Not done to conserve dye use.  IMPRESSION OF HEART CATHETERIZATION:   1. Normal multi vessel, native vessel, coronary artery.  RECOMMENDATION:   Medical treatment and life-style modification.

## 2011-02-02 NOTE — Consult Note (Signed)
Pt smokes 1 ppd per month. Takes a couple puffs off and puts it out. Pt is in contemplation stage and says that he understands that smoking affecst his health and plans to quit cold [place] on his own. No time table offered as to when he might quit however. Discussed in detail with pt risk factors of smoking on his heart health. Referred to 1-800 quit now for f/u and support. Discussed oral fixation substitutes, second hand smoke and in home smoking policy. Reviewed and gave pt Written education/contact information.

## 2011-02-02 NOTE — Progress Notes (Signed)
UR Completed.  [name] 02/02/2011 [phone]  

## 2011-02-03 LAB — BASIC METABOLIC PANEL
Calcium: 9.4 mg/dL (ref 8.4–10.5)
Chloride: 103 mEq/L (ref 96–112)
Creatinine, Ser: 1.65 mg/dL — ABNORMAL HIGH (ref 0.50–1.35)
GFR calc Af Amer: 51 mL/min — ABNORMAL LOW (ref >90)
Sodium: 137 mEq/L (ref 135–145)

## 2011-02-03 MED ORDER — OXYCODONE-ACETAMINOPHEN 5-325 MG PO TABS: 1.0000 | ORAL_TABLET | Freq: Three times a day (TID) | ORAL | Status: DC | PRN

## 2011-02-03 MED ORDER — OXYCODONE-ACETAMINOPHEN 5-325 MG PO TABS: 1.0000 | ORAL_TABLET | Freq: Three times a day (TID) | ORAL | Status: AC | PRN

## 2011-02-03 NOTE — Progress Notes (Signed)
02/03/11 1214 Nursing Note: Pt to be discharge per MD's order. Pt stable with no complaints at time. IV and cardiac monitor discontinued per MD's order. Pt received all discharge information including CHF education booklet. Pt stated different zones, medications and diet and fluid restrictions. Will escort pt to car when pt's family member arrives for discharge. [place] Scientist, clinical (histocompatibility and immunogenetics).

## 2011-02-03 NOTE — Discharge Summary (Signed)
Physician Discharge Summary  Patient ID: [name] MRN: [id number] DOB/AGE: Jun 25, 1950 60 y.o.  Admit date: 01/31/2011 Discharge date: 02/03/2011  Admission Diagnoses: *Acute on chronic systolic heart failure Active Problems:  Dilated cardiomyopathy  Hypertension, benign  Chronic renal insufficiency, stage II (mild)   Discharge Diagnoses:  Principal Problem:  *Acute on chronic systolic heart failure Active Problems:  Dilated cardiomyopathy  Hypertension, benign  Chronic renal insufficiency, stage II (mild)   Discharged Condition: fair  Hospital Course: 60 years old black male was admitted for acute on chronic heart failure with non-ischemic dilated cardiomyopathy.  Consults: cardiology  Significant Diagnostic Studies: angiography: No significant coronary artery disease.  Treatments: cardiac meds: furosemide  Discharge Exam: Blood pressure 135/112, pulse 63, temperature 97.5 F (36.4 C), temperature source Oral, resp. rate 22, height 5\' 9"  (1.753 m), weight 66.1 kg (145 lb 11.6 oz), SpO2 97.00%. HEENT: [place]/AT, Eyes-Brown, PERL, EOMI, Conjunctiva-Pink, Sclera-Non-icteric  Neck: + JVD, No bruit, Trachea midline.  Lungs: Clear, Bilateral.  Cardiac: Regular rhythm, normal S1 and S2, no S3.  Abdomen: Soft, non-tender.  Extremities: No edema present. No cyanosis. No clubbing.  CNS: AxOx3, Cranial nerves grossly intact, moves all 4 extremities. Right handed.  Skin: Warm and dry.     Disposition: Home or Self Care   Medication List  As of 02/03/2011 11:40 AM   START taking these medications         oxyCODONE-acetaminophen 5-325 MG per tablet   Commonly known as: PERCOCET   Take 1 tablet by mouth every 8 (eight) hours as needed.         CONTINUE taking these medications         aspirin EC 81 MG tablet      diltiazem 30 MG tablet   Commonly known as: CARDIZEM      furosemide 40 MG tablet   Commonly known as: LASIX      isosorbide-hydrALAZINE 20-37.5 MG  per tablet   Commonly known as: BIDIL      multivitamins ther. w/minerals Tabs      * nebivolol 10 MG tablet   Commonly known as: BYSTOLIC      * nebivolol 5 MG tablet   Commonly known as: BYSTOLIC      OVER THE COUNTER MEDICATION      ranitidine 150 MG capsule   Commonly known as: ZANTAC      spironolactone 25 MG tablet   Commonly known as: ALDACTONE     * Notice: This list has 2 medication(s) that are the same as other medications prescribed for you. Read the directions carefully, and ask your doctor or other care provider to review them with you.        Where to get your medications    These are the prescriptions that you need to pick up.   You may get these medications from any pharmacy.         oxyCODONE-acetaminophen 5-325 MG per tablet           Follow-up Information    Follow up with [facility] S, MD in 2 weeks.   Contact information:   [address] [place] [address] [phone]          Signed: Ricki Rodriguez 02/03/2011, 11:40 AM

## 2011-02-03 NOTE — Progress Notes (Signed)
02/03/11 1228 Nursing Note: Pt's blood pressure rechecked by nurse prior to discharge blood pressure 124/88. Pt stable with no pain. Pt escorted to car safely.Dagmawi Venable Scientist, clinical (histocompatibility and immunogenetics).

## 2011-07-21 ENCOUNTER — Emergency Department (HOSPITAL_COMMUNITY): Payer: Medicare Other

## 2011-07-21 ENCOUNTER — Encounter (HOSPITAL_COMMUNITY): Payer: Self-pay | Admitting: *Deleted

## 2011-07-21 ENCOUNTER — Inpatient Hospital Stay (HOSPITAL_COMMUNITY)
Admission: EM | Admit: 2011-07-21 | Discharge: 2011-07-25 | DRG: 292 | Disposition: A | Discharge status: Home / Self Care | Payer: Medicare Other | Attending: Cardiovascular Disease | Admitting: Cardiovascular Disease

## 2011-07-21 DIAGNOSIS — E876 Hypokalemia: Secondary | ICD-10-CM | POA: Diagnosis present

## 2011-07-21 DIAGNOSIS — I428 Other cardiomyopathies: Secondary | ICD-10-CM | POA: Diagnosis present

## 2011-07-21 DIAGNOSIS — I5023 Acute on chronic systolic (congestive) heart failure: Principal | ICD-10-CM | POA: Diagnosis present

## 2011-07-21 DIAGNOSIS — I059 Rheumatic mitral valve disease, unspecified: Secondary | ICD-10-CM | POA: Diagnosis present

## 2011-07-21 DIAGNOSIS — N182 Chronic kidney disease, stage 2 (mild): Secondary | ICD-10-CM | POA: Diagnosis present

## 2011-07-21 DIAGNOSIS — I509 Heart failure, unspecified: Secondary | ICD-10-CM

## 2011-07-21 DIAGNOSIS — I129 Hypertensive chronic kidney disease with stage 1 through stage 4 chronic kidney disease, or unspecified chronic kidney disease: Secondary | ICD-10-CM | POA: Diagnosis present

## 2011-07-21 DIAGNOSIS — J40 Bronchitis, not specified as acute or chronic: Secondary | ICD-10-CM | POA: Diagnosis present

## 2011-07-21 HISTORY — DX: Heart failure, unspecified: I50.9

## 2011-07-21 HISTORY — DX: Cardiomyopathy, unspecified: I42.9

## 2011-07-21 HISTORY — DX: Shortness of breath: R06.02

## 2011-07-21 LAB — BASIC METABOLIC PANEL
BUN: 36 mg/dL — ABNORMAL HIGH (ref 6–23)
CO2: 21 mEq/L (ref 19–32)
Glucose, Bld: 114 mg/dL — ABNORMAL HIGH (ref 70–99)
Potassium: 5 mEq/L (ref 3.5–5.1)
Sodium: 141 mEq/L (ref 135–145)

## 2011-07-21 LAB — CBC
HCT: 44.3 % (ref 39.0–52.0)
Hemoglobin: 15.2 g/dL (ref 13.0–17.0)
RBC: 4.38 MIL/uL (ref 4.22–5.81)

## 2011-07-21 LAB — POCT I-STAT TROPONIN I: Troponin i, poc: 0.12 ng/mL (ref 0.00–0.08)

## 2011-07-21 LAB — TROPONIN I: Troponin I: <0.3 ng/mL (ref <0.30)

## 2011-07-21 MED ORDER — THERA M PLUS PO TABS: 1.0000 | ORAL_TABLET | Freq: Every day | ORAL | Status: DC

## 2011-07-21 MED ORDER — FUROSEMIDE 10 MG/ML IJ SOLN
40.0000 mg | Freq: Once | INTRAMUSCULAR | Status: AC
  Administered 2011-07-21: 40 mg via INTRAVENOUS
  Filled 2011-07-21: qty 4

## 2011-07-21 MED ORDER — FUROSEMIDE 10 MG/ML IJ SOLN: 40.0000 mg | Freq: Once | INTRAMUSCULAR | Status: DC

## 2011-07-21 MED ORDER — ASPIRIN EC 81 MG PO TBEC
81.0000 mg | DELAYED_RELEASE_TABLET | Freq: Every day | ORAL | Status: DC
  Administered 2011-07-22 – 2011-07-25 (×4): 81 mg via ORAL
  Filled 2011-07-21 (×5): qty 1

## 2011-07-21 MED ORDER — SODIUM CHLORIDE 0.9 % IJ SOLN
3.0000 mL | Freq: Two times a day (BID) | INTRAMUSCULAR | Status: DC
  Administered 2011-07-21 – 2011-07-25 (×8): 3 mL via INTRAVENOUS

## 2011-07-21 MED ORDER — OXYCODONE HCL 5 MG PO TABS
5.0000 mg | ORAL_TABLET | Freq: Every evening | ORAL | Status: DC | PRN
  Administered 2011-07-22 (×2): 5 mg via ORAL
  Filled 2011-07-21 (×2): qty 1

## 2011-07-21 MED ORDER — ACETAMINOPHEN 325 MG PO TABS
650.0000 mg | ORAL_TABLET | ORAL | Status: DC | PRN
  Administered 2011-07-22 – 2011-07-23 (×2): 650 mg via ORAL
  Filled 2011-07-21 (×2): qty 2
  Filled 2011-07-21 (×2): qty 1

## 2011-07-21 MED ORDER — OXYCODONE HCL 5 MG PO TABS
5.0000 mg | ORAL_TABLET | Freq: Once | ORAL | Status: AC
  Administered 2011-07-21: 5 mg via ORAL
  Filled 2011-07-21: qty 1

## 2011-07-21 MED ORDER — CARVEDILOL 3.125 MG PO TABS
3.1250 mg | ORAL_TABLET | Freq: Two times a day (BID) | ORAL | Status: DC
  Administered 2011-07-21: 3.125 mg via ORAL
  Filled 2011-07-21 (×4): qty 1

## 2011-07-21 MED ORDER — PANTOPRAZOLE SODIUM 40 MG PO TBEC
40.0000 mg | DELAYED_RELEASE_TABLET | ORAL | Status: AC
  Administered 2011-07-21: 40 mg via ORAL
  Filled 2011-07-21: qty 1

## 2011-07-21 MED ORDER — ONDANSETRON HCL 4 MG/2ML IJ SOLN: 4.0000 mg | Freq: Four times a day (QID) | INTRAMUSCULAR | Status: DC | PRN

## 2011-07-21 MED ORDER — OXYCODONE-ACETAMINOPHEN 5-325 MG PO TABS: 1.0000 | ORAL_TABLET | Freq: Once | ORAL | Status: DC

## 2011-07-21 MED ORDER — ISOSORB DINITRATE-HYDRALAZINE 20-37.5 MG PO TABS
1.0000 | ORAL_TABLET | Freq: Three times a day (TID) | ORAL | Status: DC
  Administered 2011-07-21: 1 via ORAL
  Filled 2011-07-21 (×5): qty 1

## 2011-07-21 MED ORDER — ONDANSETRON HCL 4 MG/2ML IJ SOLN: 4.0000 mg | INTRAMUSCULAR | Status: DC | PRN

## 2011-07-21 MED ORDER — FUROSEMIDE 10 MG/ML IJ SOLN
40.0000 mg | Freq: Two times a day (BID) | INTRAMUSCULAR | Status: DC
  Administered 2011-07-21 – 2011-07-22 (×2): 40 mg via INTRAVENOUS
  Filled 2011-07-21 (×4): qty 4

## 2011-07-21 MED ORDER — ADULT MULTIVITAMIN W/MINERALS CH
1.0000 | ORAL_TABLET | Freq: Every day | ORAL | Status: DC
  Administered 2011-07-21 – 2011-07-25 (×5): 1 via ORAL
  Filled 2011-07-21 (×6): qty 1

## 2011-07-21 MED ORDER — MORPHINE SULFATE 4 MG/ML IJ SOLN
4.0000 mg | Freq: Once | INTRAMUSCULAR | Status: AC
  Administered 2011-07-21: 4 mg via INTRAVENOUS
  Filled 2011-07-21: qty 1

## 2011-07-21 MED ORDER — SODIUM CHLORIDE 0.9 % IJ SOLN: 3.0000 mL | INTRAMUSCULAR | Status: DC | PRN

## 2011-07-21 MED ORDER — ENOXAPARIN SODIUM 30 MG/0.3ML ~~LOC~~ SOLN
30.0000 mg | SUBCUTANEOUS | Status: DC
  Administered 2011-07-21 – 2011-07-24 (×4): 30 mg via SUBCUTANEOUS
  Filled 2011-07-21 (×5): qty 0.3

## 2011-07-21 MED ORDER — DIGOXIN 125 MCG PO TABS
125.0000 ug | ORAL_TABLET | Freq: Every day | ORAL | Status: DC
  Administered 2011-07-21: 125 ug via ORAL
  Filled 2011-07-21 (×2): qty 1

## 2011-07-21 MED ORDER — PANTOPRAZOLE SODIUM 40 MG PO TBEC
40.0000 mg | DELAYED_RELEASE_TABLET | Freq: Every day | ORAL | Status: DC
  Administered 2011-07-22 – 2011-07-25 (×4): 40 mg via ORAL
  Filled 2011-07-21 (×4): qty 1

## 2011-07-21 MED ORDER — NITROGLYCERIN 0.4 MG SL SUBL: 0.4000 mg | SUBLINGUAL_TABLET | SUBLINGUAL | Status: DC | PRN

## 2011-07-21 MED ORDER — SODIUM CHLORIDE 0.9 % IV SOLN: 250.0000 mL | INTRAVENOUS | Status: DC | PRN

## 2011-07-21 MED ORDER — ASPIRIN 81 MG PO CHEW
324.0000 mg | CHEWABLE_TABLET | Freq: Once | ORAL | Status: AC
  Administered 2011-07-21: 324 mg via ORAL
  Filled 2011-07-21: qty 4

## 2011-07-21 MED ORDER — SODIUM CHLORIDE 0.9 % IV SOLN: INTRAVENOUS | Status: DC

## 2011-07-21 NOTE — ED Notes (Signed)
Spoke with RN on 4700, unable to take report, second attempt, awaiting return call

## 2011-07-21 NOTE — ED Notes (Signed)
HR below 60, Digoxin with held

## 2011-07-21 NOTE — H&P (Signed)
[name] is an 61 y.o. male.   Chief Complaint: Shortness of breath and chest pain. HPI: 61 years old black male with hypertension, dilated cardiomyopathy has progressively worsening shortness of breath and chest pain. No extra salt intake. No skipping medications.  Past Medical History  Diagnosis Date  . Hypertension   . GERD (gastroesophageal reflux disease)   . Hiatal hernia       Past Surgical History  Procedure Date  . Cardiac catheterization     History reviewed. No pertinent family history. Social History:  reports that he has been smoking Cigarettes.  He has a 30 pack-year smoking history. He does not have any smokeless tobacco history on file. He reports that he drinks alcohol. He reports that he does not use illicit drugs.  Allergies: No Known Allergies   (Not in a hospital admission)  Results for orders placed during the hospital encounter of 07/21/11 (from the past 48 hour(s))  CBC     Status: Abnormal   Collection Time   07/21/11 12:25 PM      Component Value Range Comment   WBC 6.6  4.0 - 10.5 (K/uL)    RBC 4.38  4.22 - 5.81 (MIL/uL)    Hemoglobin 15.2  13.0 - 17.0 (g/dL)    HCT 09.8  11.9 - 14.7 (%)    MCV 101.1 (*) 78.0 - 100.0 (fL)    MCH 34.7 (*) 26.0 - 34.0 (pg)    MCHC 34.3  30.0 - 36.0 (g/dL)    RDW 82.9  56.2 - 13.0 (%)    Platelets 191  150 - 400 (K/uL)   BASIC METABOLIC PANEL     Status: Abnormal   Collection Time   07/21/11 12:25 PM      Component Value Range Comment   Sodium 141  135 - 145 (mEq/L)    Potassium 5.0  3.5 - 5.1 (mEq/L)    Chloride 107  96 - 112 (mEq/L)    CO2 21  19 - 32 (mEq/L)    Glucose, Bld 114 (*) 70 - 99 (mg/dL)    BUN 36 (*) 6 - 23 (mg/dL)    Creatinine, Ser 8.65 (*) 0.50 - 1.35 (mg/dL)    Calcium 9.7  8.4 - 10.5 (mg/dL)    GFR calc non Af Amer 40 (*) >90 (mL/min)    GFR calc Af Amer 47 (*) >90 (mL/min)   PRO B NATRIURETIC PEPTIDE     Status: Abnormal   Collection Time   07/21/11 12:25 PM      Component Value Range  Comment   Pro B Natriuretic peptide (BNP) 13483.0 (*) 0 - 125 (pg/mL)   POCT I-STAT TROPONIN I     Status: Abnormal   Collection Time   07/21/11 12:30 PM      Component Value Range Comment   Troponin i, poc 0.12 (*) 0.00 - 0.08 (ng/mL)    Comment NOTIFIED PHYSICIAN      Comment 3            TROPONIN I     Status: Normal   Collection Time   07/21/11  2:33 PM      Component Value Range Comment   Troponin I <0.30  <0.30 (ng/mL)    Dg Chest 2 View  07/21/2011  *RADIOLOGY REPORT*  Clinical Data: Shortness of breath, chest pain, history hiatal hernia, hypertension, CHF, acid reflux  CHEST - 2 VIEW  Comparison: 01/31/2011  Findings: Enlargement of cardiac silhouette with pulmonary vascular congestion.  Calcified tortuous aorta. Minimal chronic peribronchial thickening. No definite acute failure or consolidation. Question calcified granuloma right apex. No pleural effusion or pneumothorax. Bones unremarkable.  IMPRESSION: Enlargement of cardiac silhouette with pulmonary vascular congestion. Minimal chronic bronchitic changes.  Original Report Authenticated By: Lollie Marrow, M.D.    @ROS @ Constitutional: Negative for fever and chills.  HENT: Negative for neck pain and neck stiffness.  Eyes: Negative for pain.  Respiratory: Positive for shortness of breath.  Cardiovascular: Positive for chest pain.  Gastrointestinal: Negative for abdominal pain.  Genitourinary: Negative for dysuria.  Musculoskeletal: Positive for back pain.  Skin: Negative for rash.  Neurological: Negative for headaches.  All other systems reviewed and are negative.   Blood pressure 145/107, pulse 54, temperature 97.6 F (36.4 C), temperature source Oral, resp. rate 13, height 5' 7.5" (1.715 m), weight 66.679 kg (147 lb), SpO2 97.00%.   Assessment/Plan  1. Acute on chronic left heart systolic failure.  2. Chest pain 3. Hypertension.  4. Dilated cardiomyopathy.  5. Chronic renal insufficiency.-CKD-II  Admit/Telebed  R/O  MI,  IV lasix, Oxygen  2-D echo  Jex Strausbaugh S 07/21/2011, 3:37 PM

## 2011-07-21 NOTE — ED Notes (Addendum)
Attempted to call report to 11, RN not available, awaiting return call

## 2011-07-21 NOTE — ED Provider Notes (Signed)
History     CSN: [id number]  Arrival date & time 07/21/11  3086   First MD Initiated Contact with Patient 07/21/11 1157      Chief Complaint  Patient presents with  . Shortness of Breath  . Abdominal Pain    (Consider location/radiation/quality/duration/timing/severity/associated sxs/prior treatment) HPI Comments: Patient with a history of CHF and hypertension presents emergency department with a chief complaint of PND.  Patient states that he woke up at 6 in the morning gasping for air and has had associated shortness of breath since the incident.  Patient denies any chest pain but does report chest tightness & epigastric abdominal pain where his hiatal hernia is located in.  Patient states pain is 3/10 and does not radiate & describes the pain as  "a knot sitting on my diaphragm that keeps me from breathing". The pain is worse with exertion and better sitting up.  Pt reports having bilateral leg swelling, but denies, claudication, palpitations, cough, hemoptysis, fever, night sweats, chills, nausea, vomiting, melena, hematochezia.   Cardiologist: Dr. Algie Coffer, cath in December 2012 that was normal (pt had elevated troponin)   01/31/11 Study Conclusions - Left ventricle: The cavity size was mildly dilated.   Systolic function was moderately reduced. The estimated   ejection fraction was in the range of 35% to 40%. Diffuse   hypokinesis. - Mitral valve: Calcified annulus. Severe regurgitation. - Left atrium: The atrium was moderately dilated. - Right ventricle: Systolic function was mildly reduced. - Tricuspid valve: Moderate regurgitation.   The history is provided by the patient.    Past Medical History  Diagnosis Date  . Hypertension   . GERD (gastroesophageal reflux disease)   . Hiatal hernia     Past Surgical History  Procedure Date  . Cardiac catheterization     History reviewed. No pertinent family history.  History  Substance Use Topics  . Smoking status:  Current Everyday Smoker -- 2.0 packs/day for 15 years    Types: Cigarettes  . Smokeless tobacco: Not on file  . Alcohol Use: Yes      Review of Systems  All other systems reviewed and are negative.    Allergies  Review of patient's allergies indicates no known allergies.  Home Medications   Current Outpatient Rx  Name Route Sig Dispense Refill  . ASPIRIN 325 MG PO TABS Oral Take 162.5 mg by mouth daily.     Marland Kitchen DIGOXIN 0.125 MG PO TABS Oral Take 125 mcg by mouth daily.    Marland Kitchen ESOMEPRAZOLE MAGNESIUM 40 MG PO CPDR Oral Take 40 mg by mouth daily before breakfast.    . FUROSEMIDE 40 MG PO TABS Oral Take 40 mg by mouth daily.      . ISOSORB DINITRATE-HYDRALAZINE 20-37.5 MG PO TABS Oral Take 0.5 tablets by mouth 2 (two) times daily.     Carma Leaven M PLUS PO TABS Oral Take 0.5 tablets by mouth 2 (two) times daily.     . NEBIVOLOL HCL 10 MG PO TABS Oral Take 5 mg by mouth 2 (two) times daily.     Marland Kitchen OVER THE COUNTER MEDICATION Oral Take 1 capsule by mouth 6 (six) times daily. Vegetarian vitamin [place] Can take up to six times daily.    . OXYCODONE HCL 5 MG PO TABS Oral Take 5 mg by mouth at bedtime as needed. For pain.      BP 134/104  Pulse 57  Temp(Src) 97.5 F (36.4 C) (Oral)  Resp 21  SpO2 98%  Physical Exam  Nursing note and vitals reviewed. Constitutional: He is oriented to person, place, and time. He appears well-developed and well-nourished.       Hypertensive, non diaphoretic in NAD  HENT:  Head: Normocephalic and atraumatic.  Eyes: Conjunctivae and EOM are normal. Pupils are equal, round, and reactive to light.  Neck: Normal range of motion. Neck supple. Normal carotid pulses and no JVD present. Carotid bruit is not present. No rigidity. Normal range of motion present.  Cardiovascular: S1 normal, S2 normal and normal pulses.  Exam reveals no gallop and no friction rub.   No murmur heard.      1+ pitting edema bilaterally, tachycardic, no aberrant sounds on auscultations,  distal pulses intact, no carotid bruit or JVD.   Pulmonary/Chest: Effort normal. No accessory muscle usage or stridor. No respiratory distress. He has rales. He exhibits no tenderness and no bony tenderness.  Abdominal: Bowel sounds are normal. There is tenderness.       Soft , ttp of epigastric area. No palpable hernia.   Musculoskeletal: Normal range of motion.  Neurological: He is alert and oriented to person, place, and time.  Skin: Skin is warm, dry and intact. No rash noted. No cyanosis. Nails show no clubbing.  Psychiatric: He has a normal mood and affect. His behavior is normal.    ED Course  Procedures (including critical care time)  Labs Reviewed  CBC - Abnormal; Notable for the following:    MCV 101.1 (*)    MCH 34.7 (*)    All other components within normal limits  BASIC METABOLIC PANEL  PRO B NATRIURETIC PEPTIDE  POCT I-STAT TROPONIN I   Dg Chest 2 View  07/21/2011  *RADIOLOGY REPORT*  Clinical Data: Shortness of breath, chest pain, history hiatal hernia, hypertension, CHF, acid reflux  CHEST - 2 VIEW  Comparison: 01/31/2011  Findings: Enlargement of cardiac silhouette with pulmonary vascular congestion. Calcified tortuous aorta. Minimal chronic peribronchial thickening. No definite acute failure or consolidation. Question calcified granuloma right apex. No pleural effusion or pneumothorax. Bones unremarkable.  IMPRESSION: Enlargement of cardiac silhouette with pulmonary vascular congestion. Minimal chronic bronchitic changes.  Original Report Authenticated By: Lollie Marrow, M.D.     No diagnosis found.    MDM  Acute on chronic systolic HF  Pt to be admitted by Dr. Algie Coffer. Pt is currently CP free. Labs and imaging reviewed and discussed with admitting physician.  The patient appears reasonably stabilized for admission considering the current resources, flow, and capabilities available in the ED at this time, and I doubt any other [facility] requiring further screening and/or  treatment in the ED prior to admission.           Jaci Carrel, [place] 07/21/11 1355

## 2011-07-21 NOTE — ED Notes (Signed)
Phleb. At bedside

## 2011-07-21 NOTE — ED Notes (Signed)
Pt refused to change into gown. Reports, "I can just lift up my shirt and pull down my pants." Wife at bedside told pt to change into the gown. Pt reports, " I'm not changing into that thing. She can go get 4 other nurses or whatever."

## 2011-07-21 NOTE — ED Notes (Signed)
Pt states he woke up x 2 with sob, states he has probably been here approx 10 times for the same thing. Pt reports abdominall pain, states feels like a hernia. Pt is talking with complete sentences.

## 2011-07-22 LAB — BASIC METABOLIC PANEL
Calcium: 9 mg/dL (ref 8.4–10.5)
GFR calc Af Amer: 45 mL/min — ABNORMAL LOW (ref >90)
GFR calc non Af Amer: 38 mL/min — ABNORMAL LOW (ref >90)
Sodium: 140 mEq/L (ref 135–145)

## 2011-07-22 LAB — CARDIAC PANEL(CRET KIN+CKTOT+MB+TROPI)
CK, MB: 6.8 ng/mL (ref 0.3–4.0)
Relative Index: 6.5 — ABNORMAL HIGH (ref 0.0–2.5)
Total CK: 104 U/L (ref 7–232)

## 2011-07-22 LAB — MAGNESIUM: Magnesium: 1.8 mg/dL (ref 1.5–2.5)

## 2011-07-22 MED ORDER — FUROSEMIDE 40 MG PO TABS
40.0000 mg | ORAL_TABLET | Freq: Two times a day (BID) | ORAL | Status: DC
  Administered 2011-07-22 – 2011-07-24 (×5): 40 mg via ORAL
  Filled 2011-07-22 (×8): qty 1

## 2011-07-22 MED ORDER — ISOSORB DINITRATE-HYDRALAZINE 20-37.5 MG PO TABS
1.0000 | ORAL_TABLET | Freq: Two times a day (BID) | ORAL | Status: DC
  Administered 2011-07-22 – 2011-07-25 (×7): 1 via ORAL
  Filled 2011-07-22 (×8): qty 1

## 2011-07-22 MED ORDER — POTASSIUM CHLORIDE CRYS ER 20 MEQ PO TBCR
40.0000 meq | EXTENDED_RELEASE_TABLET | Freq: Once | ORAL | Status: AC
  Administered 2011-07-22: 40 meq via ORAL
  Filled 2011-07-22: qty 2

## 2011-07-22 NOTE — Progress Notes (Signed)
*  PRELIMINARY RESULTS* Echocardiogram 2D Echocardiogram has been performed.  Katheren Puller 07/22/2011, 11:47 AM

## 2011-07-22 NOTE — Progress Notes (Signed)
1345 with runs of  Tachy arryt hmias  Pt no complaint of chest pain.  No sob . Pt resting comfortably in bed . ekg done .placed a call to Dr. Algie Coffer . 1400 Dr. Algie Coffer called back no further order given. Continue monitoring done

## 2011-07-22 NOTE — ED Provider Notes (Signed)
Medical screening examination/treatment/procedure(s) were conducted as a shared visit with non-physician practitioner(s) and myself.  I personally evaluated the patient during the encounter  Upper abd pain/n/v/sob. RRR, CTAB. Found to have elevated troponin. Likely acute CHF. Diuresis, admission to cardiology for further w/u and evaluation.  Forbes Cellar, MD 07/22/11 1057

## 2011-07-22 NOTE — Progress Notes (Signed)
Subjective:  No chest pain. Some abdominal pain. Decreasing shortness of breath.  Objective:  Vital Signs in the last 24 hours: Temp:  [95.8 F (35.4 C)-98.2 F (36.8 C)] 97.3 F (36.3 C) (06/05 0647) Pulse Rate:  [50-109] 50  (06/05 0647) Cardiac Rhythm:  [-] Sinus bradycardia (06/04 2000) Resp:  [13-31] 18  (06/05 0647) BP: (120-155)/(79-113) 136/95 mmHg (06/05 0647) SpO2:  [93 %-100 %] 100 % (06/05 0647) Weight:  [65.137 kg (143 lb 9.6 oz)-66.679 kg (147 lb)] 65.137 kg (143 lb 9.6 oz) (06/05 0647)  Physical Exam: BP Readings from Last 1 Encounters:  07/22/11 136/95    Wt Readings from Last 1 Encounters:  07/22/11 65.137 kg (143 lb 9.6 oz)    Weight change:   HEENT: [place]/AT, Eyes-Brown, PERL, EOMI, Conjunctiva-Pink, Sclera-Non-icteric Neck: No JVD, No bruit, Trachea midline. Lungs:  Clearing, Bilateral. Cardiac:  Regular rhythm, normal S1 and S2, no S3.  Abdomen:  Soft, non-tender. Extremities:  No edema present. No cyanosis. No clubbing. CNS: AxOx3, Cranial nerves grossly intact, moves all 4 extremities. Right handed. Skin: Warm and dry.   Intake/Output from previous day: 06/04 0701 - 06/05 0700 In: 487 [P.O.:480; I.V.:3; IV Piggyback:4] Out: 2985 [Urine:2985]    Lab Results: BMET    Component Value Date/Time   NA 140 07/22/2011 0540   K 3.5 07/22/2011 0540   CL 106 07/22/2011 0540   CO2 24 07/22/2011 0540   GLUCOSE 91 07/22/2011 0540   BUN 35* 07/22/2011 0540   CREATININE 1.83* 07/22/2011 0540   CALCIUM 9.0 07/22/2011 0540   GFRNONAA 38* 07/22/2011 0540   GFRAA 45* 07/22/2011 0540   CBC    Component Value Date/Time   WBC 6.6 07/21/2011 1225   RBC 4.38 07/21/2011 1225   HGB 15.2 07/21/2011 1225   HCT 44.3 07/21/2011 1225   PLT 191 07/21/2011 1225   MCV 101.1* 07/21/2011 1225   MCH 34.7* 07/21/2011 1225   MCHC 34.3 07/21/2011 1225   RDW 13.2 07/21/2011 1225   LYMPHSABS 1.2 11/10/2010 1019   MONOABS 0.7 11/10/2010 1019   EOSABS 0.3 11/10/2010 1019   BASOSABS 0.1 11/10/2010 1019   CARDIAC  ENZYMES Lab Results  Component Value Date   CKTOTAL 105 02/01/2011   CKMB 6.5* 02/01/2011   TROPONINI <0.30 07/21/2011    Assessment/Plan:  Patient Active Hospital Problem List: 1. Acute on chronic left heart systolic failure.  2. Chest pain  3. Hypertension.  4. Dilated cardiomyopathy.  5. Chronic renal insufficiency.-CKD-II  Change IV lasix to PO. K+ supplement x one dose. 2-D echo.   LOS: 1 day    [name]  MD  07/22/2011, 8:36 AM

## 2011-07-23 LAB — BASIC METABOLIC PANEL
CO2: 24 mEq/L (ref 19–32)
CO2: 25 mEq/L (ref 19–32)
Calcium: 8.7 mg/dL (ref 8.4–10.5)
Calcium: 8.8 mg/dL (ref 8.4–10.5)
Creatinine, Ser: 1.61 mg/dL — ABNORMAL HIGH (ref 0.50–1.35)
Creatinine, Ser: 1.65 mg/dL — ABNORMAL HIGH (ref 0.50–1.35)
GFR calc non Af Amer: 44 mL/min — ABNORMAL LOW (ref >90)
GFR calc non Af Amer: 45 mL/min — ABNORMAL LOW (ref >90)
Glucose, Bld: 98 mg/dL (ref 70–99)
Glucose, Bld: 99 mg/dL (ref 70–99)

## 2011-07-23 LAB — PRO B NATRIURETIC PEPTIDE: Pro B Natriuretic peptide (BNP): 3322 pg/mL — ABNORMAL HIGH (ref 0–125)

## 2011-07-23 MED ORDER — ALBUTEROL SULFATE HFA 108 (90 BASE) MCG/ACT IN AERS
2.0000 | INHALATION_SPRAY | Freq: Four times a day (QID) | RESPIRATORY_TRACT | Status: AC
  Administered 2011-07-23 – 2011-07-24 (×7): 2 via RESPIRATORY_TRACT
  Filled 2011-07-23: qty 6.7

## 2011-07-23 MED ORDER — GUAIFENESIN-DM 100-10 MG/5ML PO SYRP
5.0000 mL | ORAL_SOLUTION | ORAL | Status: DC | PRN
  Administered 2011-07-24: 5 mL via ORAL
  Filled 2011-07-23: qty 5

## 2011-07-23 MED ORDER — POTASSIUM CHLORIDE CRYS ER 20 MEQ PO TBCR
40.0000 meq | EXTENDED_RELEASE_TABLET | Freq: Once | ORAL | Status: AC
  Administered 2011-07-23: 40 meq via ORAL
  Filled 2011-07-23: qty 2

## 2011-07-23 MED ORDER — AZITHROMYCIN 250 MG PO TABS
250.0000 mg | ORAL_TABLET | Freq: Every day | ORAL | Status: DC
  Administered 2011-07-24 – 2011-07-25 (×2): 250 mg via ORAL
  Filled 2011-07-23 (×2): qty 1

## 2011-07-23 MED ORDER — OXYCODONE HCL 5 MG PO TABS
5.0000 mg | ORAL_TABLET | Freq: Four times a day (QID) | ORAL | Status: DC | PRN
  Administered 2011-07-23 – 2011-07-24 (×4): 5 mg via ORAL
  Filled 2011-07-23 (×4): qty 1

## 2011-07-23 MED ORDER — HYDROCODONE-ACETAMINOPHEN 5-325 MG PO TABS
1.0000 | ORAL_TABLET | ORAL | Status: AC
  Administered 2011-07-23: 1 via ORAL
  Filled 2011-07-23: qty 1

## 2011-07-23 MED ORDER — AZITHROMYCIN 500 MG PO TABS
500.0000 mg | ORAL_TABLET | Freq: Every day | ORAL | Status: AC
  Administered 2011-07-23: 500 mg via ORAL
  Filled 2011-07-23: qty 1

## 2011-07-23 NOTE — Progress Notes (Signed)
Subjective:  + cough and congestion. Additional 2 liters diuresis. Improved blood pressure. Preserved LV systolic function with mod to severe MR and TR on 2-D echo. Afebrile.  Objective:  Vital Signs in the last 24 hours: Temp:  [97.3 F (36.3 C)-98.1 F (36.7 C)] 98.1 F (36.7 C) (06/06 0422) Pulse Rate:  [56-90] 71  (06/06 0422) Cardiac Rhythm:  [-] Junctional rhythm (06/05 2002) Resp:  [18] 18  (06/06 0422) BP: (116-124)/(67-83) 119/67 mmHg (06/06 0422) SpO2:  [97 %-100 %] 100 % (06/06 0422) Weight:  [64.9 kg (143 lb 1.3 oz)] 64.9 kg (143 lb 1.3 oz) (06/06 0422)  Physical Exam: BP Readings from Last 1 Encounters:  07/23/11 119/67    Wt Readings from Last 1 Encounters:  07/23/11 64.9 kg (143 lb 1.3 oz)    Weight change: -1.779 kg (-3 lb 14.7 oz)  HEENT: [place]/AT, Eyes-Brown, PERL, EOMI, Conjunctiva-Pink, Sclera-Non-icteric Neck: No JVD, No bruit, Trachea midline. Lungs:  Clearing, Bilateral. Few rhonchi. Cardiac:  Regular rhythm, normal S1 and S2, no S3.  Abdomen:  Soft, non-tender. Extremities:  No edema present. No cyanosis. No clubbing. CNS: AxOx3, Cranial nerves grossly intact, moves all 4 extremities. Right handed. Skin: Warm and dry.   Intake/Output from previous day: 06/05 0701 - 06/06 0700 In: 1513 [P.O.:1510; I.V.:3] Out: 4175 [Urine:4175]    Lab Results: BMET    Component Value Date/Time   NA 138 07/23/2011 0618   NA 136 07/23/2011 0618   K 3.6 07/23/2011 0618   K 3.4* 07/23/2011 0618   CL 103 07/23/2011 0618   CL 101 07/23/2011 0618   CO2 25 07/23/2011 0618   CO2 24 07/23/2011 0618   GLUCOSE 99 07/23/2011 0618   GLUCOSE 98 07/23/2011 0618   BUN 32* 07/23/2011 0618   BUN 31* 07/23/2011 0618   CREATININE 1.65* 07/23/2011 0618   CREATININE 1.61* 07/23/2011 0618   CALCIUM 8.7 07/23/2011 0618   CALCIUM 8.8 07/23/2011 0618   GFRNONAA 44* 07/23/2011 0618   GFRNONAA 45* 07/23/2011 0618   GFRAA 51* 07/23/2011 0618   GFRAA 52* 07/23/2011 0618   CBC    Component Value Date/Time   WBC 6.6  07/21/2011 1225   RBC 4.38 07/21/2011 1225   HGB 15.2 07/21/2011 1225   HCT 44.3 07/21/2011 1225   PLT 191 07/21/2011 1225   MCV 101.1* 07/21/2011 1225   MCH 34.7* 07/21/2011 1225   MCHC 34.3 07/21/2011 1225   RDW 13.2 07/21/2011 1225   LYMPHSABS 1.2 11/10/2010 1019   MONOABS 0.7 11/10/2010 1019   EOSABS 0.3 11/10/2010 1019   BASOSABS 0.1 11/10/2010 1019   CARDIAC ENZYMES Lab Results  Component Value Date   CKTOTAL 104 07/22/2011   CKMB 6.8* 07/22/2011   TROPONINI <0.30 07/22/2011    Assessment/Plan:  Patient Active Hospital Problem List: 1. Acute on chronic left heart systolic failure.  2. Chest pain  3. Hypertension.  4. Dilated cardiomyopathy.  5. Chronic renal insufficiency.-CKD-II 6. Mild hypokinesia 7. Bronchitis  Albuterol and z-pak. Continue oral lasix and po K+    LOS: 2 days    [name]  MD  07/23/2011, 7:56 AM

## 2011-07-23 NOTE — Progress Notes (Signed)
Cosign for Shannon Albrecht RN assessment, med admin, notes, and I&O 

## 2011-07-24 LAB — BASIC METABOLIC PANEL
BUN: 25 mg/dL — ABNORMAL HIGH (ref 6–23)
Calcium: 9.2 mg/dL (ref 8.4–10.5)
GFR calc non Af Amer: 47 mL/min — ABNORMAL LOW (ref >90)
Glucose, Bld: 86 mg/dL (ref 70–99)

## 2011-07-24 MED ORDER — POTASSIUM CHLORIDE CRYS ER 20 MEQ PO TBCR
EXTENDED_RELEASE_TABLET | ORAL | Status: AC
  Administered 2011-07-24: 20 meq via ORAL
  Filled 2011-07-24: qty 1

## 2011-07-24 MED ORDER — FUROSEMIDE 40 MG PO TABS
40.0000 mg | ORAL_TABLET | Freq: Every day | ORAL | Status: DC
  Administered 2011-07-25: 40 mg via ORAL
  Filled 2011-07-24: qty 1

## 2011-07-24 MED ORDER — POTASSIUM CHLORIDE CRYS ER 20 MEQ PO TBCR
40.0000 meq | EXTENDED_RELEASE_TABLET | Freq: Once | ORAL | Status: AC
  Administered 2011-07-24 (×2): 20 meq via ORAL
  Filled 2011-07-24: qty 1

## 2011-07-24 NOTE — Plan of Care (Signed)
Problem: Phase I Progression Outcomes Goal: Other Phase I Outcomes/Goals Outcome: Completed/Met Date Met:  07/24/11 Spoke with pt about daily weights, taking medication as prescribed, and signs and symptoms for which the pt should contact MD. Pt states he weighs himself daily and should contact the MD if he gains more than 2 lbs overnight or 5 lbs in a week. Pt states he takes his medications as prescribed especially his furosemide. Pt able to state that he should contact MD if he has swelling or increased SOB. Will continue to educate pt.

## 2011-07-24 NOTE — Progress Notes (Signed)
Cosign for Levell July RN assessment, med admin, care plan/education, and I&O

## 2011-07-24 NOTE — Progress Notes (Signed)
Subjective:  No chest pain. Decreasing cough and shortness of breath. Additional 3 Litre of negative fluid balance.  Objective:  Vital Signs in the last 24 hours: Temp:  [97.3 F (36.3 C)-98 F (36.7 C)] 97.4 F (36.3 C) (06/07 0652) Pulse Rate:  [59-104] 104  (06/07 0652) Cardiac Rhythm:  [-] Sinus bradycardia;Junctional rhythm (06/07 0752) Resp:  [18-19] 18  (06/07 0652) BP: (116-137)/(92-97) 123/97 mmHg (06/07 0652) SpO2:  [96 %-100 %] 96 % (06/07 0652) Weight:  [63.5 kg (139 lb 15.9 oz)] 63.5 kg (139 lb 15.9 oz) (06/07 4782)  Physical Exam: BP Readings from Last 1 Encounters:  07/24/11 123/97    Wt Readings from Last 1 Encounters:  07/24/11 63.5 kg (139 lb 15.9 oz)    Weight change: -1.4 kg (-3 lb 1.4 oz)  HEENT: [place]/AT, Eyes-Brown, PERL, EOMI, Conjunctiva-Pink, Sclera-Non-icteric Neck: No JVD, No bruit, Trachea midline. Lungs:  Clearing, Bilateral. Rareforced expiratory wheeze Cardiac:  Regular rhythm, normal S1 and S2, no S3.  Abdomen:  Soft, non-tender. Extremities:  No edema present. No cyanosis. No clubbing. CNS: AxOx3, Cranial nerves grossly intact, moves all 4 extremities. Right handed. Skin: Warm and dry.   Intake/Output from previous day: 06/06 0701 - 06/07 0700 In: 1200 [P.O.:1200] Out: 4301 [Urine:4300; Stool:1]    Lab Results: BMET    Component Value Date/Time   NA 138 07/24/2011 0540   K 3.9 07/24/2011 0540   CL 102 07/24/2011 0540   CO2 24 07/24/2011 0540   GLUCOSE 86 07/24/2011 0540   BUN 25* 07/24/2011 0540   CREATININE 1.55* 07/24/2011 0540   CALCIUM 9.2 07/24/2011 0540   GFRNONAA 47* 07/24/2011 0540   GFRAA 54* 07/24/2011 0540   CBC    Component Value Date/Time   WBC 6.6 07/21/2011 1225   RBC 4.38 07/21/2011 1225   HGB 15.2 07/21/2011 1225   HCT 44.3 07/21/2011 1225   PLT 191 07/21/2011 1225   MCV 101.1* 07/21/2011 1225   MCH 34.7* 07/21/2011 1225   MCHC 34.3 07/21/2011 1225   RDW 13.2 07/21/2011 1225   LYMPHSABS 1.2 11/10/2010 1019   MONOABS 0.7 11/10/2010 1019   EOSABS 0.3 11/10/2010 1019   BASOSABS 0.1 11/10/2010 1019   CARDIAC ENZYMES Lab Results  Component Value Date   CKTOTAL 104 07/22/2011   CKMB 6.8* 07/22/2011   TROPONINI <0.30 07/22/2011    Assessment/Plan:  Patient Active Hospital Problem List: 1. Acute on chronic left heart systolic failure.-Improving 2. Chest pain  3. Hypertension.  4. Dilated cardiomyopathy.-Stable  5. Chronic renal insufficiency.-CKD-II  6. Mild hypokalemia-improved  7. Bronchitis  Increase activity. Decrease Lasix dose. Home in AM if stable   LOS: 3 days    [name]  MD  07/24/2011, 8:10 AM

## 2011-07-25 NOTE — Progress Notes (Signed)
Pt being discharged today.  DC'd tele and IV.  Discharge instructions and education provided.  Pt demonstrated understanding via Teach Back.  Pt taken off unit via wheelchair and accompanied by staff.

## 2011-07-25 NOTE — Progress Notes (Signed)
Cosign for Levell July RN assessment, med admin, care plan/education, and notes

## 2011-07-25 NOTE — Discharge Summary (Signed)
Physician Discharge Summary  Patient ID: KALIEF KATTNER MRN: [id number] DOB/AGE: 1950/07/19 61 y.o.  Admit date: 07/21/2011 Discharge date: 07/25/2011  Admission Diagnoses: 1. Acute on chronic left heart systolic failure. 2. Chest pain  3. Hypertension.  4. Dilated cardiomyopathy.-Stable  5. Chronic renal insufficiency.-CKD-II   Discharge Diagnoses:  Active Problems: 1. Acute on chronic left heart systolic failure.*Principle diagnosis 2. Chest pain  3. Hypertension.  4. Dilated cardiomyopathy.-Stable  5. Chronic renal insufficiency.-CKD-II  6. Mild hypokalemia  7. Bronchitis  Discharged Condition: good  Hospital Course: 61 years old black male with dilated cardiomyopathy had chest pain and shortness of breath without stopping medications or extra salt intake. He responded to IV followed by oral lasix very well. He was discharged home in satisfactory condition with follow up in 1 week.   Consults: None  Significant Diagnostic Studies: labs: Normal CBC, Electrolytes but elevated BUN/Cr -36/1.76 and Pro-BNP-13483.     Echocardiogram: - Dilated left ventricle with LVH and moderate systolic dysfunction.The estimated ejection fraction was in the range of 30% to 35%.  - Aortic valve: Trivial regurgitation. - Mitral valve: Calcified annulus. Severe regurgitation. - Left atrium: The appendage was moderately to severely dilated. - Right ventricle: The cavity size was mildly dilated. Systolic function was moderately reduced. - Tricuspid valve: Moderate regurgitation. - Pulmonary arteries: Systolic pressure was mildly increased. PA peak pressure: 39mm Hg (S).  Treatments: cardiac meds: furosemide  Discharge Exam: Blood pressure 135/92, pulse 106, temperature 97.6 F (36.4 C), temperature source Oral, resp. rate 18, height 5\' 7"  (1.702 m), weight 64.3 kg (141 lb 12.1 oz), SpO2 98.00%. HEENT: [place]/AT, Eyes-Brown, PERL, EOMI, Conjunctiva-Pink, Sclera-Non-icteric  Neck: No JVD, No bruit,  Trachea midline.  Lungs: Clearing, Bilateral. Few rhonchi.  Cardiac: Regular rhythm, normal S1 and S2, no S3.  Abdomen: Soft, non-tender.  Extremities: No edema present. No cyanosis. No clubbing.  CNS: AxOx3, Cranial nerves grossly intact, moves all 4 extremities. Right handed.  Skin: Warm and dry.  Disposition: 01-Home or Self Care   Medication List  As of 07/25/2011  9:22 AM   STOP taking these medications         digoxin 0.125 MG tablet      OVER THE COUNTER MEDICATION         TAKE these medications         aspirin 325 MG tablet   Take 162.5 mg by mouth daily.      esomeprazole 40 MG capsule   Commonly known as: NEXIUM   Take 40 mg by mouth daily before breakfast.      furosemide 40 MG tablet   Commonly known as: LASIX   Take 40 mg by mouth daily.      isosorbide-hydrALAZINE 20-37.5 MG per tablet   Commonly known as: BIDIL   Take 0.5 tablets by mouth 2 (two) times daily.      multivitamins ther. w/minerals Tabs   Take 0.5 tablets by mouth 2 (two) times daily.      nebivolol 10 MG tablet   Commonly known as: BYSTOLIC   Take 5 mg by mouth 2 (two) times daily.      oxyCODONE 5 MG immediate release tablet   Commonly known as: Oxy IR/ROXICODONE   Take 5 mg by mouth at bedtime as needed. For pain.           Follow-up Information    Follow up with [facility], MD. Schedule an appointment as soon as possible for a visit in 1 week.  Contact information:   [address] [place] [address] [phone]          Signed: Ricki Rodriguez 07/25/2011, 9:22 AM

## 2011-07-27 NOTE — Progress Notes (Signed)
Utilization Review Completed.[name] T6/11/2011   

## 2011-08-10 ENCOUNTER — Emergency Department (HOSPITAL_COMMUNITY): Payer: Medicare Other

## 2011-08-10 ENCOUNTER — Emergency Department (HOSPITAL_COMMUNITY)
Admission: EM | Admit: 2011-08-10 | Discharge: 2011-08-10 | Disposition: A | Discharge status: Home / Self Care | Payer: Medicare Other | Attending: Emergency Medicine | Admitting: Emergency Medicine

## 2011-08-10 ENCOUNTER — Encounter (HOSPITAL_COMMUNITY): Payer: Self-pay | Admitting: *Deleted

## 2011-08-10 DIAGNOSIS — I517 Cardiomegaly: Secondary | ICD-10-CM | POA: Insufficient documentation

## 2011-08-10 DIAGNOSIS — R079 Chest pain, unspecified: Secondary | ICD-10-CM

## 2011-08-10 DIAGNOSIS — I509 Heart failure, unspecified: Secondary | ICD-10-CM | POA: Insufficient documentation

## 2011-08-10 DIAGNOSIS — R1013 Epigastric pain: Secondary | ICD-10-CM | POA: Insufficient documentation

## 2011-08-10 DIAGNOSIS — R0602 Shortness of breath: Secondary | ICD-10-CM | POA: Insufficient documentation

## 2011-08-10 DIAGNOSIS — F17200 Nicotine dependence, unspecified, uncomplicated: Secondary | ICD-10-CM | POA: Insufficient documentation

## 2011-08-10 DIAGNOSIS — R109 Unspecified abdominal pain: Secondary | ICD-10-CM

## 2011-08-10 DIAGNOSIS — K219 Gastro-esophageal reflux disease without esophagitis: Secondary | ICD-10-CM | POA: Insufficient documentation

## 2011-08-10 DIAGNOSIS — I10 Essential (primary) hypertension: Secondary | ICD-10-CM | POA: Insufficient documentation

## 2011-08-10 DIAGNOSIS — Z7982 Long term (current) use of aspirin: Secondary | ICD-10-CM | POA: Insufficient documentation

## 2011-08-10 LAB — CBC
HCT: 40.9 % (ref 39.0–52.0)
Hemoglobin: 13.9 g/dL (ref 13.0–17.0)
MCH: 33.8 pg (ref 26.0–34.0)
MCHC: 34 g/dL (ref 30.0–36.0)
MCV: 99.5 fL (ref 78.0–100.0)
Platelets: 205 10*3/uL (ref 150–400)
RBC: 4.11 MIL/uL — ABNORMAL LOW (ref 4.22–5.81)
RDW: 12.6 % (ref 11.5–15.5)
WBC: 3.7 10*3/uL — ABNORMAL LOW (ref 4.0–10.5)

## 2011-08-10 LAB — LIPASE, BLOOD: Lipase: 35 U/L (ref 11–59)

## 2011-08-10 LAB — BASIC METABOLIC PANEL
BUN: 33 mg/dL — ABNORMAL HIGH (ref 6–23)
CO2: 18 mEq/L — ABNORMAL LOW (ref 19–32)
Calcium: 9.5 mg/dL (ref 8.4–10.5)
Chloride: 104 mEq/L (ref 96–112)
Creatinine, Ser: 1.9 mg/dL — ABNORMAL HIGH (ref 0.50–1.35)
GFR calc Af Amer: 43 mL/min — ABNORMAL LOW (ref >90)
GFR calc non Af Amer: 37 mL/min — ABNORMAL LOW (ref >90)
Glucose, Bld: 145 mg/dL — ABNORMAL HIGH (ref 70–99)
Potassium: 3.7 mEq/L (ref 3.5–5.1)
Sodium: 139 mEq/L (ref 135–145)

## 2011-08-10 LAB — TROPONIN I: Troponin I: <0.3 ng/mL (ref <0.30)

## 2011-08-10 LAB — PRO B NATRIURETIC PEPTIDE: Pro B Natriuretic peptide (BNP): 17207 pg/mL — ABNORMAL HIGH (ref 0–125)

## 2011-08-10 MED ORDER — OXYCODONE-ACETAMINOPHEN 5-325 MG PO TABS: 1.0000 | ORAL_TABLET | ORAL | Status: AC | PRN

## 2011-08-10 MED ORDER — FUROSEMIDE 10 MG/ML IJ SOLN
40.0000 mg | Freq: Once | INTRAMUSCULAR | Status: AC
  Administered 2011-08-10: 40 mg via INTRAVENOUS
  Filled 2011-08-10: qty 4

## 2011-08-10 MED ORDER — PANTOPRAZOLE SODIUM 20 MG PO TBEC: 40.0000 mg | DELAYED_RELEASE_TABLET | Freq: Every day | ORAL | Status: DC

## 2011-08-10 MED ORDER — HYDROMORPHONE HCL PF 1 MG/ML IJ SOLN
1.0000 mg | Freq: Once | INTRAMUSCULAR | Status: AC
  Administered 2011-08-10: 1 mg via INTRAVENOUS
  Filled 2011-08-10: qty 1

## 2011-08-10 MED ORDER — ONDANSETRON HCL 4 MG PO TABS: 4.0000 mg | ORAL_TABLET | Freq: Four times a day (QID) | ORAL | Status: AC

## 2011-08-10 MED ORDER — IOHEXOL 300 MG/ML  SOLN
20.0000 mL | INTRAMUSCULAR | Status: AC
  Administered 2011-08-10 (×2): 20 mL via ORAL

## 2011-08-10 MED ORDER — ONDANSETRON HCL 4 MG/2ML IJ SOLN
4.0000 mg | Freq: Once | INTRAMUSCULAR | Status: AC
  Administered 2011-08-10: 4 mg via INTRAVENOUS
  Filled 2011-08-10: qty 2

## 2011-08-10 NOTE — ED Provider Notes (Signed)
History    60yM with upper abdominal/lower sternal pain. Gradual onset about a day ago. Relatively constant. Feels like pressure that needs to burp but can't. No appreciable exacerbating or relieving factors. Mild dyspnea. No cough.  No fever or chills. No urinary complaints.    CSN: [id number]  Arrival date & time 08/10/11  1011   First MD Initiated Contact with Patient 08/10/11 1030      Chief Complaint  Patient presents with  . Chest Pain  . Shortness of Breath    (Consider location/radiation/quality/duration/timing/severity/associated sxs/prior treatment) HPI  Past Medical History  Diagnosis Date  . Hypertension   . GERD (gastroesophageal reflux disease)   . Hiatal hernia   . CHF (congestive heart failure)   . Cardiomyopathy   . Shortness of breath     Past Surgical History  Procedure Date  . Cardiac catheterization     History reviewed. No pertinent family history.  History  Substance Use Topics  . Smoking status: Current Everyday Smoker -- 2.0 packs/day for 15 years    Types: Cigarettes  . Smokeless tobacco: Never Used  . Alcohol Use: Yes     ocassional      Review of Systems   Review of symptoms negative unless otherwise noted in HPI.   Allergies  Review of patient's allergies indicates no known allergies.  Home Medications   Current Outpatient Rx  Name Route Sig Dispense Refill  . ASPIRIN 325 MG PO TABS Oral Take 162.5 mg by mouth daily.     Marland Kitchen ESOMEPRAZOLE MAGNESIUM 40 MG PO CPDR Oral Take 40 mg by mouth daily before breakfast.    . FUROSEMIDE 40 MG PO TABS Oral Take 40 mg by mouth daily.      . ISOSORB DINITRATE-HYDRALAZINE 20-37.5 MG PO TABS Oral Take 0.5 tablets by mouth 2 (two) times daily.     Carma Leaven M PLUS PO TABS Oral Take 0.5 tablets by mouth 2 (two) times daily.     . NEBIVOLOL HCL 10 MG PO TABS Oral Take 5 mg by mouth 2 (two) times daily.     . OXYCODONE HCL 5 MG PO TABS Oral Take 5 mg by mouth at bedtime as needed. For pain.       BP 110/90  Pulse 112  Temp 98.9 F (37.2 C)  Resp 16  SpO2 98%  Physical Exam  Nursing note and vitals reviewed. Constitutional: He appears well-developed and well-nourished. No distress.  HENT:  Head: Normocephalic and atraumatic.  Eyes: Conjunctivae are normal. Right eye exhibits no discharge. Left eye exhibits no discharge.  Neck: Neck supple.  Cardiovascular: Normal rate, regular rhythm and normal heart sounds.  Exam reveals no gallop and no friction rub.   No murmur heard. Pulmonary/Chest: Effort normal and breath sounds normal. No respiratory distress.  Abdominal: Soft. He exhibits no distension. There is tenderness. There is guarding. There is no rebound.       Mild tenderness in epigastrium with voluntary guarding. No rebound tenderness. No distention.  Musculoskeletal: He exhibits no edema and no tenderness.  Neurological: He is alert.  Skin: Skin is warm and dry. He is not diaphoretic.  Psychiatric: He has a normal mood and affect. His behavior is normal. Thought content normal.    ED Course  Procedures (including critical care time)  Labs Reviewed  CBC - Abnormal; Notable for the following:    WBC 3.7 (*)     RBC 4.11 (*)     All other components within normal  limits  PRO B NATRIURETIC PEPTIDE - Abnormal; Notable for the following:    Pro B Natriuretic peptide (BNP) 17207.0 (*)     All other components within normal limits  TROPONIN I  BASIC METABOLIC PANEL   Dg Chest 2 View  08/10/2011  *RADIOLOGY REPORT*  Clinical Data: Epigastric pain, shortness of breath, nausea.  CHEST - 2 VIEW  Comparison: 07/21/2011.  Findings: There is a stable cardiomegaly. There is mild improvement in interstitial accentuation consistent with mild improving interstitial edematous changes.  There are no focal infiltrates. There is no evidence for mediastinal or hilar adenopathy.  There is no free peritoneal air.  IMPRESSION:  1.  Stable cardiomegaly with improvement in mild  interstitial edema.  2.  No acute findings.  Original Report Authenticated By: Rolla Plate, M.D.     1. Chest pain   2. Abdominal pain       MDM  60yM with lower sternal/epigastric pain. Suspect GI etiology such as gastritis, gerd, pud. Low suspicion for emergent surgical process or ACS. Pt on nexium already. Will give zofran for prn nausea. EKG abnormal but stable from previous. Return precautions discussed. Outpt fu.       Raeford Razor, MD 08/16/11 [phone]

## 2011-08-10 NOTE — ED Notes (Signed)
Patient transported to CT 

## 2011-08-10 NOTE — ED Notes (Signed)
Pt currently is in radiology

## 2011-08-10 NOTE — ED Notes (Addendum)
Pt also c/o rt sided pain on abdomen and nausea that he states just started a couple of hours ago

## 2011-08-10 NOTE — ED Notes (Signed)
Pt states he has been having chest pain for 3 days. Pt states he is having a stabbing pain on the left side of his chest and pain on the right side of his chest, pt states pain level 7.

## 2011-08-10 NOTE — ED Notes (Signed)
Pt. Returned from CT.

## 2011-08-10 NOTE — ED Notes (Signed)
To ED for eval of sob and cp. States he was in hospital a cple weeks ago with same. Pt appears grey in color. Describes pain as a gas or a cramp

## 2011-08-10 NOTE — ED Notes (Signed)
Pt currently in room from radiology

## 2011-08-17 HISTORY — PX: CARDIAC CATHETERIZATION: SHX172

## 2011-08-23 ENCOUNTER — Emergency Department (HOSPITAL_COMMUNITY): Payer: Medicare Other

## 2011-08-23 ENCOUNTER — Inpatient Hospital Stay (HOSPITAL_COMMUNITY)
Admission: EM | Admit: 2011-08-23 | Discharge: 2011-08-31 | DRG: 292 | Disposition: A | Discharge status: Home / Self Care | Payer: Medicare Other | Attending: Cardiovascular Disease | Admitting: Cardiovascular Disease

## 2011-08-23 ENCOUNTER — Encounter (HOSPITAL_COMMUNITY): Payer: Self-pay | Admitting: Emergency Medicine

## 2011-08-23 DIAGNOSIS — I428 Other cardiomyopathies: Secondary | ICD-10-CM | POA: Diagnosis present

## 2011-08-23 DIAGNOSIS — Z79899 Other long term (current) drug therapy: Secondary | ICD-10-CM

## 2011-08-23 DIAGNOSIS — N182 Chronic kidney disease, stage 2 (mild): Secondary | ICD-10-CM | POA: Diagnosis present

## 2011-08-23 DIAGNOSIS — F17200 Nicotine dependence, unspecified, uncomplicated: Secondary | ICD-10-CM | POA: Diagnosis present

## 2011-08-23 DIAGNOSIS — Z7982 Long term (current) use of aspirin: Secondary | ICD-10-CM

## 2011-08-23 DIAGNOSIS — E876 Hypokalemia: Secondary | ICD-10-CM | POA: Diagnosis present

## 2011-08-23 DIAGNOSIS — R0789 Other chest pain: Secondary | ICD-10-CM | POA: Diagnosis present

## 2011-08-23 DIAGNOSIS — I509 Heart failure, unspecified: Secondary | ICD-10-CM | POA: Diagnosis present

## 2011-08-23 DIAGNOSIS — I4891 Unspecified atrial fibrillation: Secondary | ICD-10-CM | POA: Diagnosis present

## 2011-08-23 DIAGNOSIS — N289 Disorder of kidney and ureter, unspecified: Secondary | ICD-10-CM

## 2011-08-23 DIAGNOSIS — I5023 Acute on chronic systolic (congestive) heart failure: Principal | ICD-10-CM

## 2011-08-23 DIAGNOSIS — K219 Gastro-esophageal reflux disease without esophagitis: Secondary | ICD-10-CM | POA: Diagnosis present

## 2011-08-23 DIAGNOSIS — I129 Hypertensive chronic kidney disease with stage 1 through stage 4 chronic kidney disease, or unspecified chronic kidney disease: Secondary | ICD-10-CM | POA: Diagnosis present

## 2011-08-23 LAB — CBC
HCT: 43.4 % (ref 39.0–52.0)
MCH: 32.8 pg (ref 26.0–34.0)
MCH: 33.4 pg (ref 26.0–34.0)
MCHC: 34.4 g/dL (ref 30.0–36.0)
MCV: 96.9 fL (ref 78.0–100.0)
MCV: 97.1 fL (ref 78.0–100.0)
Platelets: 207 10*3/uL (ref 150–400)
Platelets: 198 10*3/uL (ref 150–400)
RBC: 4.43 MIL/uL (ref 4.22–5.81)
RDW: 12.7 % (ref 11.5–15.5)
WBC: 5.2 10*3/uL (ref 4.0–10.5)

## 2011-08-23 LAB — CARDIAC PANEL(CRET KIN+CKTOT+MB+TROPI)
CK, MB: 8.1 ng/mL (ref 0.3–4.0)
CK, MB: 7.3 ng/mL (ref 0.3–4.0)
Relative Index: 6 — ABNORMAL HIGH (ref 0.0–2.5)
Relative Index: 7.1 — ABNORMAL HIGH (ref 0.0–2.5)
Total CK: 159 U/L (ref 7–232)
Total CK: 97 U/L (ref 7–232)
Troponin I: <0.3 ng/mL (ref <0.30)
Troponin I: <0.3 ng/mL (ref <0.30)

## 2011-08-23 LAB — MRSA PCR SCREENING: MRSA by PCR: NEGATIVE

## 2011-08-23 LAB — CREATININE, SERUM: Creatinine, Ser: 2.24 mg/dL — ABNORMAL HIGH (ref 0.50–1.35)

## 2011-08-23 LAB — BASIC METABOLIC PANEL
CO2: 17 mEq/L — ABNORMAL LOW (ref 19–32)
Calcium: 9.8 mg/dL (ref 8.4–10.5)
Chloride: 105 mEq/L (ref 96–112)
Creatinine, Ser: 2.26 mg/dL — ABNORMAL HIGH (ref 0.50–1.35)
Glucose, Bld: 114 mg/dL — ABNORMAL HIGH (ref 70–99)

## 2011-08-23 MED ORDER — HYDROCODONE-ACETAMINOPHEN 5-325 MG PO TABS
1.0000 | ORAL_TABLET | Freq: Every day | ORAL | Status: DC
  Administered 2011-08-23 – 2011-08-30 (×6): 1 via ORAL
  Filled 2011-08-23 (×6): qty 1

## 2011-08-23 MED ORDER — ACETAMINOPHEN 325 MG PO TABS
650.0000 mg | ORAL_TABLET | ORAL | Status: DC | PRN
  Administered 2011-08-23: 650 mg via ORAL
  Filled 2011-08-23: qty 2

## 2011-08-23 MED ORDER — ONDANSETRON HCL 4 MG/2ML IJ SOLN: 4.0000 mg | Freq: Four times a day (QID) | INTRAMUSCULAR | Status: DC | PRN

## 2011-08-23 MED ORDER — MORPHINE SULFATE 4 MG/ML IJ SOLN
4.0000 mg | Freq: Once | INTRAMUSCULAR | Status: AC
  Administered 2011-08-23: 4 mg via INTRAVENOUS
  Filled 2011-08-23: qty 1

## 2011-08-23 MED ORDER — ENOXAPARIN SODIUM 30 MG/0.3ML ~~LOC~~ SOLN
30.0000 mg | SUBCUTANEOUS | Status: DC
  Administered 2011-08-23 – 2011-08-26 (×4): 30 mg via SUBCUTANEOUS
  Filled 2011-08-23 (×4): qty 0.3

## 2011-08-23 MED ORDER — FUROSEMIDE 10 MG/ML IJ SOLN
40.0000 mg | Freq: Once | INTRAMUSCULAR | Status: AC
  Administered 2011-08-23: 40 mg via INTRAVENOUS
  Filled 2011-08-23: qty 4

## 2011-08-23 MED ORDER — ASPIRIN 81 MG PO CHEW
324.0000 mg | CHEWABLE_TABLET | Freq: Once | ORAL | Status: AC
  Administered 2011-08-23: 324 mg via ORAL
  Filled 2011-08-23: qty 4

## 2011-08-23 MED ORDER — SODIUM CHLORIDE 0.9 % IV SOLN
250.0000 mL | INTRAVENOUS | Status: DC | PRN
  Administered 2011-08-23: 250 mL via INTRAVENOUS
  Administered 2011-08-24: 500 mL via INTRAVENOUS

## 2011-08-23 MED ORDER — GI COCKTAIL ~~LOC~~
30.0000 mL | Freq: Once | ORAL | Status: AC
  Administered 2011-08-23: 30 mL via ORAL
  Filled 2011-08-23: qty 30

## 2011-08-23 MED ORDER — ASPIRIN 325 MG PO TABS
162.5000 mg | ORAL_TABLET | Freq: Every day | ORAL | Status: DC
  Administered 2011-08-23 – 2011-08-27 (×5): 162.5 mg via ORAL
  Filled 2011-08-23 (×2): qty 0.5
  Filled 2011-08-23: qty 1
  Filled 2011-08-23 (×3): qty 0.5

## 2011-08-23 MED ORDER — PANTOPRAZOLE SODIUM 40 MG PO TBEC
40.0000 mg | DELAYED_RELEASE_TABLET | Freq: Every day | ORAL | Status: DC
  Administered 2011-08-23 – 2011-08-31 (×9): 40 mg via ORAL
  Filled 2011-08-23 (×8): qty 1

## 2011-08-23 MED ORDER — FUROSEMIDE 10 MG/ML IJ SOLN
80.0000 mg | Freq: Once | INTRAMUSCULAR | Status: AC
  Administered 2011-08-23: 80 mg via INTRAVENOUS
  Filled 2011-08-23: qty 8

## 2011-08-23 MED ORDER — SODIUM CHLORIDE 0.9 % IJ SOLN
3.0000 mL | Freq: Two times a day (BID) | INTRAMUSCULAR | Status: DC
  Administered 2011-08-23 – 2011-08-30 (×10): 3 mL via INTRAVENOUS

## 2011-08-23 MED ORDER — SODIUM CHLORIDE 0.9 % IJ SOLN: 3.0000 mL | INTRAMUSCULAR | Status: DC | PRN

## 2011-08-23 MED ORDER — DOBUTAMINE IN D5W 4-5 MG/ML-% IV SOLN
5.0000 ug/kg/min | INTRAVENOUS | Status: DC
  Administered 2011-08-23: 5 ug/kg/min via INTRAVENOUS
  Filled 2011-08-23: qty 250

## 2011-08-23 NOTE — Progress Notes (Signed)
08/23/11 0829  OTHER  CSW Follow Up Status No follow-up needed     Consult unit based LCSW if psychosocial needs are identified and/or if disposition needs are noted.   Dionne Milo MSW [facility] Emergency Dept. Weekend/Social Worker [phone]

## 2011-08-23 NOTE — ED Provider Notes (Signed)
History     CSN: [id number]  Arrival date & time 08/23/11  0311   First MD Initiated Contact with Patient 08/23/11 0345      Chief Complaint  Patient presents with  . Shortness of Breath    (Consider location/radiation/quality/duration/timing/severity/associated sxs/prior treatment) HPI 61 yo male presents to the ER via EMS from home with complaint of 4 days of worsening sob and lower extremity edema, and ongoing upper abd/chest pain.  Pt has h/o htn, cardiomyopathy, chf, and hiatal hernia.  Pt denies missing any medications, no change in diet.  Pt is awaiting GI evaluation for his hiatal hernia.  Chest/abd pain is unchanged from his baseline.     Past Medical History  Diagnosis Date  . Hypertension   . GERD (gastroesophageal reflux disease)   . Hiatal hernia   . CHF (congestive heart failure)   . Cardiomyopathy   . Shortness of breath     Past Surgical History  Procedure Date  . Cardiac catheterization     No family history on file.  History  Substance Use Topics  . Smoking status: Current Everyday Smoker -- 2.0 packs/day for 15 years    Types: Cigarettes  . Smokeless tobacco: Never Used  . Alcohol Use: Yes     ocassional      Review of Systems  All other systems reviewed and are negative.    Allergies  Review of patient's allergies indicates no known allergies.  Home Medications   Current Outpatient Rx  Name Route Sig Dispense Refill  . ALBUTEROL SULFATE HFA 108 (90 BASE) MCG/ACT IN AERS Inhalation Inhale 2 puffs into the lungs every 6 (six) hours as needed. For shortness of breath    . ASPIRIN 325 MG PO TABS Oral Take 162.5 mg by mouth daily.     Marland Kitchen BISMUTH SUBSALICYLATE 262 MG PO CHEW Oral Chew 524 mg by mouth as needed.    Marland Kitchen ESOMEPRAZOLE MAGNESIUM 40 MG PO CPDR Oral Take 40 mg by mouth daily before breakfast.    . FUROSEMIDE 40 MG PO TABS Oral Take 40 mg by mouth daily.      Marland Kitchen HYDROCODONE-ACETAMINOPHEN 5-325 MG PO TABS Oral Take 1 tablet by mouth at  bedtime. For pain    . ISOSORB DINITRATE-HYDRALAZINE 20-37.5 MG PO TABS Oral Take 0.5-1 tablets by mouth 3 (three) times daily.     Carma Leaven M PLUS PO TABS Oral Take 0.5 tablets by mouth 2 (two) times daily.     . NEBIVOLOL HCL 10 MG PO TABS Oral Take 5 mg by mouth 2 (two) times daily.       BP 120/65  Pulse 111  Temp 97 F (36.1 C) (Axillary)  Resp 18  SpO2 92%  Physical Exam  Nursing note and vitals reviewed. Constitutional: He is oriented to person, place, and time. He appears well-developed and well-nourished. He appears distressed (uncomfortable).  HENT:  Head: Normocephalic and atraumatic.  Neck: Normal range of motion. Neck supple. JVD present. No tracheal deviation present. No thyromegaly present.  Cardiovascular: Normal rate, regular rhythm, normal heart sounds and intact distal pulses.  Exam reveals no gallop and no friction rub.   No murmur heard. Pulmonary/Chest: Effort normal. No stridor. No respiratory distress. He has no wheezes. He has rales (fine crackles in bases). He exhibits no tenderness.  Abdominal: Soft. Bowel sounds are normal. He exhibits no distension and no mass. There is tenderness. There is no rebound and no guarding.  Musculoskeletal: Normal range of motion. He  exhibits edema (2+pitting edema to knees bilaterally).  Lymphadenopathy:    He has no cervical adenopathy.  Neurological: He is alert and oriented to person, place, and time.  Skin: Skin is warm and dry. No rash noted. No erythema. No pallor.  Psychiatric: He has a normal mood and affect. His behavior is normal. Judgment and thought content normal.    ED Course  Procedures (including critical care time)  Labs Reviewed  CARDIAC PANEL(CRET KIN+CKTOT+MB+TROPI) - Abnormal; Notable for the following:    CK, MB 9.6 (*)     Relative Index 6.0 (*)     All other components within normal limits  BASIC METABOLIC PANEL - Abnormal; Notable for the following:    CO2 17 (*)     Glucose, Bld 114 (*)      BUN 53 (*)     Creatinine, Ser 2.26 (*)     GFR calc non Af Amer 30 (*)     GFR calc Af Amer 35 (*)     All other components within normal limits  PRO B NATRIURETIC PEPTIDE - Abnormal; Notable for the following:    Pro B Natriuretic peptide (BNP) 18397.0 (*)     All other components within normal limits  CBC   Dg Chest 2 View  08/23/2011  *RADIOLOGY REPORT*  Clinical Data: Chest pain, shortness of breath and nausea.  CHEST - 2 VIEW  Comparison: Chest radiograph performed 08/10/2011  Findings: The lungs are well-aerated.  Mild vascular congestion is noted, without significant pulmonary edema.  Mild bilateral atelectasis is seen.  There is no evidence of pleural effusion or pneumothorax.  The heart is enlarged, as on the prior study.  Calcification is noted in the aortic arch.  No acute osseous abnormalities are seen.  IMPRESSION: Cardiomegaly and mild vascular congestion again noted, without significant pulmonary edema.  Mild bilateral atelectasis seen.  Original Report Authenticated By: Tonia Ghent, M.D.    Date: 08/23/2011  Rate: 118  Rhythm: sinus tachycardia and premature ventricular contractions (PVC)  QRS Axis: left  Intervals: normal  ST/T Wave abnormalities: nonspecific ST changes  Conduction Disutrbances:nonspecific intraventricular conduction delay  Narrative Interpretation:   Old EKG Reviewed: unchanged    1. Acute on chronic systolic heart failure   2. Acute on chronic renal insufficiency       MDM  61 yo male with CHF exacerbation.  Pt feeling better after gi coctail and pain medications for his ongoing chest/abd pain.  D/w Dr Algie Coffer who will admit.        Olivia Mackie, MD 08/23/11 [phone]

## 2011-08-23 NOTE — Progress Notes (Signed)
08/23/11 0829  Discharge Planning  Type of Residence Private residence  Living Arrangements Spouse/significant other  Home Care Services No  Support Systems Spouse/significant other  Do you have any problems obtaining your medications? No  Family/patient expects to be discharged to: Private residence  Once you are discharged, how will you get to your follow-up appointment? Family  Expected Discharge Date 08/27/11  Case Management Consult Needed No  Social Work Consult Needed No

## 2011-08-23 NOTE — H&P (Signed)
[name] is an 61 y.o. male.   Chief Complaint: Shortness of breath and bilateral leg edema. HPI: 61 years old male with dilated cardiomyopathy has progressive worsening of shortness of breath and leg edema inspite of not taking extra salt intake. Elevated BNP and BUN/Cr today.  Past Medical History  Diagnosis Date  . Hypertension   . GERD (gastroesophageal reflux disease)   . Hiatal hernia   . CHF (congestive heart failure)   . Cardiomyopathy   . Shortness of breath       Past Surgical History  Procedure Date  . Cardiac catheterization     No family history on file. Social History:  reports that he has been smoking Cigarettes.  He has a 30 pack-year smoking history. He has never used smokeless tobacco. He reports that he drinks alcohol. He reports that he does not use illicit drugs.  Allergies: No Known Allergies   (Not in a hospital admission)  Results for orders placed during the hospital encounter of 08/23/11 (from the past 48 hour(s))  CARDIAC PANEL(CRET KIN+CKTOT+MB+TROPI)     Status: Abnormal   Collection Time   08/23/11  3:54 AM      Component Value Range Comment   Total CK 159  7 - 232 U/L    CK, MB 9.6 (*) 0.3 - 4.0 ng/mL    Troponin I <0.30  <0.30 ng/mL    Relative Index 6.0 (*) 0.0 - 2.5   BASIC METABOLIC PANEL     Status: Abnormal   Collection Time   08/23/11  3:54 AM      Component Value Range Comment   Sodium 139  135 - 145 mEq/L    Potassium 4.2  3.5 - 5.1 mEq/L    Chloride 105  96 - 112 mEq/L    CO2 17 (*) 19 - 32 mEq/L    Glucose, Bld 114 (*) 70 - 99 mg/dL    BUN 53 (*) 6 - 23 mg/dL    Creatinine, Ser 2.95 (*) 0.50 - 1.35 mg/dL    Calcium 9.8  8.4 - 62.1 mg/dL    GFR calc non Af Amer 30 (*) >90 mL/min    GFR calc Af Amer 35 (*) >90 mL/min   CBC     Status: Normal   Collection Time   08/23/11  3:54 AM      Component Value Range Comment   WBC 5.2  4.0 - 10.5 K/uL    RBC 4.48  4.22 - 5.81 MIL/uL    Hemoglobin 14.7  13.0 - 17.0 g/dL    HCT 30.8   65.7 - 84.6 %    MCV 96.9  78.0 - 100.0 fL    MCH 32.8  26.0 - 34.0 pg    MCHC 33.9  30.0 - 36.0 g/dL    RDW 96.2  95.2 - 84.1 %    Platelets 207  150 - 400 K/uL   PRO B NATRIURETIC PEPTIDE     Status: Abnormal   Collection Time   08/23/11  3:54 AM      Component Value Range Comment   Pro B Natriuretic peptide (BNP) 18397.0 (*) 0 - 125 pg/mL    Dg Chest 2 View  08/23/2011  *RADIOLOGY REPORT*  Clinical Data: Chest pain, shortness of breath and nausea.  CHEST - 2 VIEW  Comparison: Chest radiograph performed 08/10/2011  Findings: The lungs are well-aerated.  Mild vascular congestion is noted, without significant pulmonary edema.  Mild bilateral atelectasis is seen.  There is no evidence of pleural effusion or pneumothorax.  The heart is enlarged, as on the prior study.  Calcification is noted in the aortic arch.  No acute osseous abnormalities are seen.  IMPRESSION: Cardiomegaly and mild vascular congestion again noted, without significant pulmonary edema.  Mild bilateral atelectasis seen.  Original Report Authenticated By: Tonia Ghent, M.D.    @ROS @ Constitutional: Negative for fever and chills.  HENT: Negative for neck pain and neck stiffness.  Eyes: Negative for pain.  Respiratory: Positive for shortness of breath.  Cardiovascular: Positive for chest pain.  Gastrointestinal: Negative for abdominal pain.  Genitourinary: Negative for dysuria.  Musculoskeletal: Positive for back pain. +ve leg edema Skin: Negative for rash.  Neurological: Positive for headaches.   Physical Exam Blood pressure 110/72, pulse 60, temperature 97 F (36.1 C), temperature source Axillary, resp. rate 16, SpO2 100.00%. Constitutional: He is oriented to person, place, and time. He appears well-developed and well-nourished.  HENT: Head: Normocephalic and atraumatic. Eyes: Brown eyes, Conjunctivae and EOM are normal. Pupils are equal, round, and reactive to light.  Neck: Normal range of motion. Neck supple. Normal  carotid pulses and full JVD present. Carotid bruit is not present. No rigidity. Normal range of motion present.  Cardiovascular: S1 normal, S2 normal and normal pulses. Exam reveals no gallop and no friction rub. II/VI systolic murmur at left sternal border. Pulmonary/Chest: Mild respiratory distress but lungs clear to auscultation.  Abdominal: Bowel sounds are normal. There is epigastric tenderness.   Musculoskeletal: Normal range of motion.  Neurological: He is alert and oriented to person, place, and time.  Skin: Skin is warm, dry and intact. No rash noted. No cyanosis. + clubbing.  Psychiatric: He has a normal mood and affect.    Assessment/Plan 1. Acute on chronic left heart systolic failure.  2. Chest pain  3. Hypertension.  4. Dilated cardiomyopathy.  5. Chronic renal insufficiency.-CKD-III   Admit/Telebed  R/O MI,  IV Dobutamine Oxygen  2-D echo    Natalyah Cummiskey S 08/23/2011, 6:57 AM

## 2011-08-23 NOTE — ED Notes (Signed)
Report received, assumed care.  

## 2011-08-23 NOTE — ED Notes (Signed)
PER EMS- Pt has been SOB for 4 days. History of CHF, swelling in ankles. Lungs clear, NAD. Alertx4. Vitals WNL. BP 112/72, HR 68, R 18, 99% RA.

## 2011-08-23 NOTE — ED Notes (Signed)
CKMB: 9.6 (Critical Lab) - Dr. Norlene Campbell notified

## 2011-08-23 NOTE — ED Notes (Signed)
Respirations even & unlabored, no distress. POX >96% on 2L [place] Reports has been waking up "gasping for air" at times

## 2011-08-24 LAB — BASIC METABOLIC PANEL
CO2: 20 mEq/L (ref 19–32)
Calcium: 8.9 mg/dL (ref 8.4–10.5)
Chloride: 104 mEq/L (ref 96–112)
Creatinine, Ser: 2.23 mg/dL — ABNORMAL HIGH (ref 0.50–1.35)
GFR calc Af Amer: 35 mL/min — ABNORMAL LOW (ref >90)
Sodium: 138 mEq/L (ref 135–145)

## 2011-08-24 LAB — CARDIAC PANEL(CRET KIN+CKTOT+MB+TROPI)
Relative Index: INVALID (ref 0.0–2.5)
Troponin I: <0.3 ng/mL (ref <0.30)

## 2011-08-24 MED ORDER — MORPHINE SULFATE 2 MG/ML IJ SOLN
2.0000 mg | Freq: Once | INTRAMUSCULAR | Status: AC
  Administered 2011-08-24: 2 mg via INTRAVENOUS
  Filled 2011-08-24: qty 1

## 2011-08-24 MED ORDER — FUROSEMIDE 10 MG/ML IJ SOLN
80.0000 mg | Freq: Once | INTRAMUSCULAR | Status: AC
  Administered 2011-08-24: 80 mg via INTRAVENOUS
  Filled 2011-08-24: qty 8

## 2011-08-24 MED ORDER — HYDROCODONE-ACETAMINOPHEN 5-325 MG PO TABS
1.0000 | ORAL_TABLET | ORAL | Status: DC | PRN
  Administered 2011-08-24 – 2011-08-31 (×9): 2 via ORAL
  Filled 2011-08-24 (×11): qty 2

## 2011-08-24 NOTE — Progress Notes (Signed)
Subjective:  Feels better. 2 litre negative fluid balance.   Objective:  Vital Signs in the last 24 hours: Temp:  [97.2 F (36.2 C)-97.7 F (36.5 C)] 97.6 F (36.4 C) (07/08 0700) Pulse Rate:  [53-115] 55  (07/08 0600) Cardiac Rhythm:  [-] Sinus tachycardia;Normal sinus rhythm (07/08 0800) Resp:  [5-25] 14  (07/08 0600) BP: (93-132)/(76-101) 109/85 mmHg (07/08 0600) SpO2:  [94 %-100 %] 98 % (07/08 0600) Weight:  [69.2 kg (152 lb 8.9 oz)-69.5 kg (153 lb 3.5 oz)] 69.5 kg (153 lb 3.5 oz) (07/08 0515)  Physical Exam: BP Readings from Last 1 Encounters:  08/24/11 109/85    Wt Readings from Last 1 Encounters:  08/24/11 69.5 kg (153 lb 3.5 oz)    Weight change:   HEENT: [place]/AT, Eyes-Brown, PERL, EOMI, Conjunctiva-Pink, Sclera-Non-icteric Neck: + JVD, No bruit, Trachea midline. Lungs:  Clear, Bilateral. Cardiac:  Regular rhythm, normal S1 and S2, no S3.  Abdomen:  Soft, non-tender. Extremities:  No edema present. No cyanosis. No clubbing. CNS: AxOx3, Cranial nerves grossly intact, moves all 4 extremities. Right handed. Skin: Warm and dry.   Intake/Output from previous day: 07/07 0701 - 07/08 0700 In: 1416.1 [P.O.:1394; I.V.:22.1] Out: 3455 [Urine:3455]    Lab Results: BMET    Component Value Date/Time   NA 138 08/24/2011 0412   K 3.5 08/24/2011 0412   CL 104 08/24/2011 0412   CO2 20 08/24/2011 0412   GLUCOSE 123* 08/24/2011 0412   BUN 52* 08/24/2011 0412   CREATININE 2.23* 08/24/2011 0412   CALCIUM 8.9 08/24/2011 0412   GFRNONAA 30* 08/24/2011 0412   GFRAA 35* 08/24/2011 0412   CBC    Component Value Date/Time   WBC 4.5 08/23/2011 1036   RBC 4.43 08/23/2011 1036   HGB 14.8 08/23/2011 1036   HCT 43.0 08/23/2011 1036   PLT 198 08/23/2011 1036   MCV 97.1 08/23/2011 1036   MCH 33.4 08/23/2011 1036   MCHC 34.4 08/23/2011 1036   RDW 12.8 08/23/2011 1036   LYMPHSABS 1.2 11/10/2010 1019   MONOABS 0.7 11/10/2010 1019   EOSABS 0.3 11/10/2010 1019   BASOSABS 0.1 11/10/2010 1019   CARDIAC ENZYMES Lab  Results  Component Value Date   CKTOTAL 94 08/24/2011   CKMB 6.3* 08/24/2011   TROPONINI <0.30 08/24/2011    Assessment/Plan:  Patient Active Hospital Problem List:  1. Acute on chronic left heart systolic failure.  2. Chest pain  3. Hypertension.  4. Dilated cardiomyopathy.  5. Chronic renal insufficiency.-CKD-III   Decrease dobutamine to 3 mcg/kg/min to decrease tachycardia.   LOS: 1 day    [name]  MD  08/24/2011, 9:10 AM

## 2011-08-24 NOTE — Progress Notes (Signed)
  Echocardiogram 2D Echocardiogram has been performed.  Davyn Morandi 08/24/2011, 10:15 AM

## 2011-08-24 NOTE — Care Management Note (Addendum)
    Page 1 of 1   08/31/2011     9:34:20 AM   CARE MANAGEMENT NOTE 08/31/2011  Patient:  [name], [name]   Account Number:  [id number]  Date Initiated:  08/24/2011  Documentation initiated by:  Junius Creamer  Subjective/Objective Assessment:   adm w heart failure     Action/Plan:   lives w fam, pcp dr [name]   Anticipated DC Date:     Anticipated DC Plan:        DC Planning Services  CM consult      Choice offered to / List presented to:             Status of service:  Completed, signed off Medicare Important Message given?   (If response is "NO", the following Medicare IM given date fields will be blank) Date Medicare IM given:   Date Additional Medicare IM given:    Discharge Disposition:  HOME/SELF CARE  Per UR Regulation:  Reviewed for med. necessity/level of care/duration of stay  If discussed at Long Length of Stay Meetings, dates discussed:    Comments:  08-31-11 0933 Tomi Bamberger, RN,BSN [phone] Plan for d/c today. Pt is not homebound. Refuses HHRN at this time. No further needs for CM at this time.    7/8 10a debbie dowell rn,bsn [phone] will moniter for dc needs as pt progresses.

## 2011-08-24 NOTE — Progress Notes (Signed)
Pharmacist Heart Failure Core Measure Documentation  Assessment: [name] has an EF documented as 30-35% on 07/22/11 by ECHO.  Rationale: Heart failure patients with left ventricular systolic dysfunction (LVSD) and an EF < 40% should be prescribed an angiotensin converting enzyme inhibitor (ACEI) or angiotensin receptor blocker (ARB) at discharge unless a contraindication is documented in the medical record.  This patient is not currently on an ACEI or ARB for HF.  This note is being placed in the record in order to provide documentation that a contraindication to the use of these agents is present for this encounter.  ACE Inhibitor or Angiotensin Receptor Blocker is contraindicated (specify all that apply)  []   ACEI allergy AND ARB allergy []   Angioedema []   Moderate or severe aortic stenosis []   Hyperkalemia []   Hypotension []   Renal artery stenosis [x]   Worsening renal function, preexisting renal disease or dysfunction   Fredrik Rigger 08/24/2011 11:46 AM

## 2011-08-25 LAB — BASIC METABOLIC PANEL
CO2: 21 mEq/L (ref 19–32)
Calcium: 8.9 mg/dL (ref 8.4–10.5)
GFR calc non Af Amer: 41 mL/min — ABNORMAL LOW (ref >90)
Potassium: 3.4 mEq/L — ABNORMAL LOW (ref 3.5–5.1)
Sodium: 135 mEq/L (ref 135–145)

## 2011-08-25 MED ORDER — AMLODIPINE BESYLATE 2.5 MG PO TABS
2.5000 mg | ORAL_TABLET | Freq: Every day | ORAL | Status: DC
  Administered 2011-08-25 – 2011-08-27 (×3): 2.5 mg via ORAL
  Filled 2011-08-25 (×4): qty 1

## 2011-08-25 MED ORDER — ISOSORB DINITRATE-HYDRALAZINE 20-37.5 MG PO TABS
1.0000 | ORAL_TABLET | Freq: Two times a day (BID) | ORAL | Status: DC
  Administered 2011-08-25 – 2011-08-31 (×13): 1 via ORAL
  Filled 2011-08-25 (×14): qty 1

## 2011-08-25 MED ORDER — FUROSEMIDE 40 MG PO TABS
40.0000 mg | ORAL_TABLET | Freq: Two times a day (BID) | ORAL | Status: DC
  Administered 2011-08-25 – 2011-08-31 (×13): 40 mg via ORAL
  Filled 2011-08-25 (×15): qty 1

## 2011-08-25 MED ORDER — POTASSIUM CHLORIDE CRYS ER 10 MEQ PO TBCR
10.0000 meq | EXTENDED_RELEASE_TABLET | Freq: Two times a day (BID) | ORAL | Status: DC
  Administered 2011-08-25 – 2011-08-27 (×6): 10 meq via ORAL
  Filled 2011-08-25 (×8): qty 1

## 2011-08-25 MED ORDER — NEBIVOLOL HCL 5 MG PO TABS
5.0000 mg | ORAL_TABLET | Freq: Every day | ORAL | Status: DC
  Administered 2011-08-25 – 2011-08-31 (×7): 5 mg via ORAL
  Filled 2011-08-25 (×7): qty 1

## 2011-08-25 MED ORDER — DOBUTAMINE IN D5W 4-5 MG/ML-% IV SOLN
2.5000 ug/kg/min | INTRAVENOUS | Status: DC
  Administered 2011-08-25: 5 ug/kg/min via INTRAVENOUS
  Filled 2011-08-25 (×2): qty 250

## 2011-08-25 NOTE — Progress Notes (Signed)
Subjective:  Feeling better but leg edema continues. Additional about 2 litre neg fluid balance.  Objective:  Vital Signs in the last 24 hours: Temp:  [94.4 F (34.7 C)-97.4 F (36.3 C)] 97.3 F (36.3 C) (07/09 0730) Pulse Rate:  [46-123] 112  (07/09 0730) Cardiac Rhythm:  [-] Sinus tachycardia (07/09 0808) Resp:  [13-24] 23  (07/09 0730) BP: (111-128)/(82-108) 116/100 mmHg (07/09 0730) SpO2:  [96 %-100 %] 100 % (07/09 0730)  Physical Exam: BP Readings from Last 1 Encounters:  08/25/11 116/100    Wt Readings from Last 1 Encounters:  08/24/11 69.5 kg (153 lb 3.5 oz)    Weight change:   HEENT: [place]/AT, Eyes-Brown, PERL, EOMI, Conjunctiva-Pink, Sclera-Non-icteric Neck: + JVD, No bruit, Trachea midline. Lungs:  Clear, Bilateral. Cardiac:  Regular rhythm, normal S1 and S2, no S3.  Abdomen:  Soft, non-tender. Extremities:  2 + edema present. No cyanosis. + clubbing. CNS: AxOx3, Cranial nerves grossly intact, moves all 4 extremities. Right handed. Skin: Warm and dry.   Intake/Output from previous day: 07/08 0701 - 07/09 0700 In: 303.7 [I.V.:295.7; IV Piggyback:8] Out: 2100 [Urine:2100]    Lab Results: BMET    Component Value Date/Time   NA 135 08/25/2011 0630   K 3.4* 08/25/2011 0630   CL 102 08/25/2011 0630   CO2 21 08/25/2011 0630   GLUCOSE 86 08/25/2011 0630   BUN 42* 08/25/2011 0630   CREATININE 1.74* 08/25/2011 0630   CALCIUM 8.9 08/25/2011 0630   GFRNONAA 41* 08/25/2011 0630   GFRAA 47* 08/25/2011 0630   CBC    Component Value Date/Time   WBC 4.5 08/23/2011 1036   RBC 4.43 08/23/2011 1036   HGB 14.8 08/23/2011 1036   HCT 43.0 08/23/2011 1036   PLT 198 08/23/2011 1036   MCV 97.1 08/23/2011 1036   MCH 33.4 08/23/2011 1036   MCHC 34.4 08/23/2011 1036   RDW 12.8 08/23/2011 1036   LYMPHSABS 1.2 11/10/2010 1019   MONOABS 0.7 11/10/2010 1019   EOSABS 0.3 11/10/2010 1019   BASOSABS 0.1 11/10/2010 1019   CARDIAC ENZYMES Lab Results  Component Value Date   CKTOTAL 94 08/24/2011   CKMB 6.3* 08/24/2011     TROPONINI <0.30 08/24/2011    Assessment/Plan:  Patient Active Hospital Problem List: 1. Acute on chronic left heart systolic failure.  2. Chest pain  3. Hypertension.  4. Dilated cardiomyopathy.  5. Chronic renal insufficiency.-CKD-II 6. Hypokalemia.  Add Bystolic and Bidil to improve cardiac output. Small dose Norvasc to decrease right heart pressures. Add K+ supplement   LOS: 2 days    [name]  MD  08/25/2011, 8:32 AM

## 2011-08-26 LAB — BASIC METABOLIC PANEL
CO2: 21 mEq/L (ref 19–32)
Calcium: 8.7 mg/dL (ref 8.4–10.5)
Chloride: 104 mEq/L (ref 96–112)
Glucose, Bld: 172 mg/dL — ABNORMAL HIGH (ref 70–99)
Potassium: 3.5 mEq/L (ref 3.5–5.1)
Sodium: 139 mEq/L (ref 135–145)

## 2011-08-26 LAB — CBC
HCT: 38.8 % — ABNORMAL LOW (ref 39.0–52.0)
Hemoglobin: 13 g/dL (ref 13.0–17.0)
MCH: 32.8 pg (ref 26.0–34.0)
MCV: 98 fL (ref 78.0–100.0)
RBC: 3.96 MIL/uL — ABNORMAL LOW (ref 4.22–5.81)

## 2011-08-26 MED ORDER — ENOXAPARIN SODIUM 40 MG/0.4ML ~~LOC~~ SOLN
40.0000 mg | SUBCUTANEOUS | Status: DC
  Administered 2011-08-27 – 2011-08-30 (×4): 40 mg via SUBCUTANEOUS
  Filled 2011-08-26 (×5): qty 0.4

## 2011-08-26 MED ORDER — MORPHINE SULFATE 2 MG/ML IJ SOLN
2.0000 mg | Freq: Three times a day (TID) | INTRAMUSCULAR | Status: DC | PRN
  Administered 2011-08-26 – 2011-08-28 (×2): 2 mg via INTRAVENOUS
  Filled 2011-08-26 (×2): qty 1

## 2011-08-26 NOTE — Progress Notes (Signed)
Pharmacy - Lovenox Dose Adjustment  61 yo M admitted with HF exacerbation.  Renal function has improved since admission (SCr 2.24 >>1.52) and CrCl is now >30 ml/min.  Will adjust Lovenox dose to 40 mg SQ daily for VTE prophylaxis.  [employer], Pharm.D., BCPS Clinical Pharmacist Pager [phone] 08/26/2011 10:55 AM

## 2011-08-26 NOTE — Progress Notes (Addendum)
Tonight's dose of BIDIL accidentally given at 1400.  Dr. Sharyn Lull Notified new orders received:  1. Do not give anymore bidil today 2. Give bystolic now if SBP<110. Patient notified of error.

## 2011-08-26 NOTE — Progress Notes (Signed)
Subjective:  Feels better. Leg edema continues but decreasing  Objective:  Vital Signs in the last 24 hours: Temp:  [97.3 F (36.3 C)-98.4 F (36.9 C)] 97.7 F (36.5 C) (07/10 0405) Pulse Rate:  [60-112] 112  (07/09 2100) Cardiac Rhythm:  [-] Sinus tachycardia (07/10 0500) Resp:  [17-23] 18  (07/10 0500) BP: (100-123)/(73-102) 105/75 mmHg (07/10 0500) SpO2:  [94 %-100 %] 99 % (07/10 0500) Weight:  [70.8 kg (156 lb 1.4 oz)-71.8 kg (158 lb 4.6 oz)] 71.8 kg (158 lb 4.6 oz) (07/10 0500)  Physical Exam: BP Readings from Last 1 Encounters:  08/26/11 105/75    Wt Readings from Last 1 Encounters:  08/26/11 71.8 kg (158 lb 4.6 oz)    Weight change:   HEENT: [place]/AT, Eyes-Brown, PERL, EOMI, Conjunctiva-Pink, Sclera-Non-icteric Neck: + JVD, No bruit, Trachea midline. Lungs:  Clear, Bilateral. Cardiac:  Regular rhythm, normal S1 and S2, no S3.  Abdomen:  Soft, non-tender. Extremities:  1 + edema present. No cyanosis. + clubbing. CNS: AxOx3, Cranial nerves grossly intact, moves all 4 extremities. Right handed. Skin: Warm and dry.   Intake/Output from previous day: 07/09 0701 - 07/10 0700 In: 1246.6 [P.O.:960; I.V.:286.6] Out: 1926 [Urine:1925; Stool:1]    Lab Results: BMET    Component Value Date/Time   NA 139 08/26/2011 0515   K 3.5 08/26/2011 0515   CL 104 08/26/2011 0515   CO2 21 08/26/2011 0515   GLUCOSE 172* 08/26/2011 0515   BUN 34* 08/26/2011 0515   CREATININE 1.52* 08/26/2011 0515   CALCIUM 8.7 08/26/2011 0515   GFRNONAA 48* 08/26/2011 0515   GFRAA 56* 08/26/2011 0515   CBC    Component Value Date/Time   WBC 5.7 08/26/2011 0515   RBC 3.96* 08/26/2011 0515   HGB 13.0 08/26/2011 0515   HCT 38.8* 08/26/2011 0515   PLT 144* 08/26/2011 0515   MCV 98.0 08/26/2011 0515   MCH 32.8 08/26/2011 0515   MCHC 33.5 08/26/2011 0515   RDW 12.7 08/26/2011 0515   LYMPHSABS 1.2 11/10/2010 1019   MONOABS 0.7 11/10/2010 1019   EOSABS 0.3 11/10/2010 1019   BASOSABS 0.1 11/10/2010 1019   CARDIAC  ENZYMES Lab Results  Component Value Date   CKTOTAL 94 08/24/2011   CKMB 6.3* 08/24/2011   TROPONINI <0.30 08/24/2011    Assessment/Plan:  Patient Active Hospital Problem List:  1. Acute on chronic left heart systolic failure-Improving.  2. Chest pain  3. Hypertension.  4. Dilated cardiomyopathy.  5. Chronic renal insufficiency.-CKD-II  6. Hypokalemia- Improving.  Transfer to telebed   LOS: 3 days    [name]  MD  08/26/2011, 7:14 AM

## 2011-08-27 MED ORDER — METOLAZONE 5 MG PO TABS
5.0000 mg | ORAL_TABLET | Freq: Every day | ORAL | Status: DC
  Administered 2011-08-27 – 2011-08-31 (×5): 5 mg via ORAL
  Filled 2011-08-27 (×7): qty 1

## 2011-08-27 NOTE — Progress Notes (Signed)
Subjective:  Back discomfort from hard bed.  Objective:  Vital Signs in the last 24 hours: Temp:  [97.4 F (36.3 C)-98 F (36.7 C)] 97.6 F (36.4 C) (07/11 0500) Pulse Rate:  [70-118] 112  (07/11 0500) Cardiac Rhythm:  [-] Sinus tachycardia (07/11 0813) Resp:  [18-20] 18  (07/11 0500) BP: (110-133)/(76-92) 110/76 mmHg (07/11 0500) SpO2:  [95 %-100 %] 95 % (07/11 0500) Weight:  [71.26 kg (157 lb 1.6 oz)] 71.26 kg (157 lb 1.6 oz) (07/11 0500)  Physical Exam: BP Readings from Last 1 Encounters:  08/27/11 110/76    Wt Readings from Last 1 Encounters:  08/27/11 71.26 kg (157 lb 1.6 oz)    Weight change: 0.46 kg (1 lb 0.2 oz)  HEENT: [place]/AT, Eyes-Brown, PERL, EOMI, Conjunctiva-Pink, Sclera-Non-icteric Neck: + JVD, No bruit, Trachea midline. Lungs:  Clear, Bilateral. Cardiac:  Regular rhythm, normal S1 and S2, no S3.  Abdomen:  Soft, non-tender. Extremities:  1 + edema present. No cyanosis. No clubbing. CNS: AxOx3, Cranial nerves grossly intact, moves all 4 extremities. Right handed. Skin: Warm and dry.   Intake/Output from previous day: 07/10 0701 - 07/11 0700 In: 368 [P.O.:342; I.V.:26] Out: 200 [Urine:200]    Lab Results: BMET    Component Value Date/Time   NA 139 08/26/2011 0515   K 3.5 08/26/2011 0515   CL 104 08/26/2011 0515   CO2 21 08/26/2011 0515   GLUCOSE 172* 08/26/2011 0515   BUN 34* 08/26/2011 0515   CREATININE 1.52* 08/26/2011 0515   CALCIUM 8.7 08/26/2011 0515   GFRNONAA 48* 08/26/2011 0515   GFRAA 56* 08/26/2011 0515   CBC    Component Value Date/Time   WBC 5.7 08/26/2011 0515   RBC 3.96* 08/26/2011 0515   HGB 13.0 08/26/2011 0515   HCT 38.8* 08/26/2011 0515   PLT 144* 08/26/2011 0515   MCV 98.0 08/26/2011 0515   MCH 32.8 08/26/2011 0515   MCHC 33.5 08/26/2011 0515   RDW 12.7 08/26/2011 0515   LYMPHSABS 1.2 11/10/2010 1019   MONOABS 0.7 11/10/2010 1019   EOSABS 0.3 11/10/2010 1019   BASOSABS 0.1 11/10/2010 1019   CARDIAC ENZYMES Lab Results  Component Value  Date   CKTOTAL 94 08/24/2011   CKMB 6.3* 08/24/2011   TROPONINI <0.30 08/24/2011    Assessment/Plan:  Patient Active Hospital Problem List: 1. Acute on chronic left heart systolic failure-Improving.  2. Chest pain  3. Hypertension.  4. Dilated cardiomyopathy.  5. Chronic renal insufficiency.-CKD-II  6. Hypokalemia- Improving.     LOS: 4 days    [name]  MD  08/27/2011, 9:37 AM

## 2011-08-28 LAB — CBC
HCT: 37.2 % — ABNORMAL LOW (ref 39.0–52.0)
MCH: 33 pg (ref 26.0–34.0)
MCV: 97.4 fL (ref 78.0–100.0)
Platelets: 151 10*3/uL (ref 150–400)
RDW: 13.1 % (ref 11.5–15.5)

## 2011-08-28 LAB — BASIC METABOLIC PANEL
BUN: 29 mg/dL — ABNORMAL HIGH (ref 6–23)
CO2: 25 mEq/L (ref 19–32)
Calcium: 9 mg/dL (ref 8.4–10.5)
Chloride: 101 mEq/L (ref 96–112)
Creatinine, Ser: 1.23 mg/dL (ref 0.50–1.35)
Glucose, Bld: 121 mg/dL — ABNORMAL HIGH (ref 70–99)

## 2011-08-28 MED ORDER — ASPIRIN EC 81 MG PO TBEC
81.0000 mg | DELAYED_RELEASE_TABLET | Freq: Every day | ORAL | Status: DC
  Administered 2011-08-28 – 2011-08-31 (×4): 81 mg via ORAL
  Filled 2011-08-28 (×4): qty 1

## 2011-08-28 MED ORDER — ASPIRIN 81 MG PO TBEC: 81.0000 mg | DELAYED_RELEASE_TABLET | Freq: Every day | ORAL | Status: DC

## 2011-08-28 MED ORDER — BISACODYL 10 MG RE SUPP: 10.0000 mg | Freq: Every day | RECTAL | Status: DC | PRN

## 2011-08-28 MED ORDER — DOCUSATE SODIUM 100 MG PO CAPS
100.0000 mg | ORAL_CAPSULE | Freq: Every day | ORAL | Status: DC | PRN
  Administered 2011-08-28: 100 mg via ORAL
  Filled 2011-08-28: qty 1

## 2011-08-28 MED ORDER — NITROGLYCERIN 0.4 MG SL SUBL: 0.4000 mg | SUBLINGUAL_TABLET | SUBLINGUAL | Status: DC | PRN

## 2011-08-28 MED ORDER — POTASSIUM CHLORIDE CRYS ER 20 MEQ PO TBCR
20.0000 meq | EXTENDED_RELEASE_TABLET | Freq: Three times a day (TID) | ORAL | Status: DC
  Administered 2011-08-28 – 2011-08-31 (×10): 20 meq via ORAL
  Filled 2011-08-28 (×13): qty 1

## 2011-08-28 MED ORDER — DILTIAZEM HCL 30 MG PO TABS
30.0000 mg | ORAL_TABLET | Freq: Four times a day (QID) | ORAL | Status: DC
  Administered 2011-08-28 – 2011-08-31 (×12): 30 mg via ORAL
  Filled 2011-08-28 (×16): qty 1

## 2011-08-28 NOTE — Progress Notes (Signed)
Subjective:  Had episode of chest pain controlled well with morphine use. Cardiac cath 7 months ago had shown normal coronaries.  Objective:  Vital Signs in the last 24 hours: Temp:  [97.5 F (36.4 C)-98.5 F (36.9 C)] 98.4 F (36.9 C) (07/12 0500) Pulse Rate:  [71-119] 119  (07/12 0840) Cardiac Rhythm:  [-] Sinus tachycardia (07/12 0802) Resp:  [18-20] 20  (07/12 0500) BP: (101-134)/(66-90) 134/73 mmHg (07/12 0840) SpO2:  [95 %-100 %] 100 % (07/12 0500) Weight:  [71.6 kg (157 lb 13.6 oz)] 71.6 kg (157 lb 13.6 oz) (07/12 0644)  Physical Exam: BP Readings from Last 1 Encounters:  08/28/11 134/73    Wt Readings from Last 1 Encounters:  08/28/11 71.6 kg (157 lb 13.6 oz)    Weight change: 0.34 kg (12 oz)  HEENT: [place]/AT, Eyes-Brown, PERL, EOMI, Conjunctiva-Pink, Sclera-Non-icteric Neck: No JVD, No bruit, Trachea midline. Lungs:  Clear, Bilateral. Cardiac:  Regular rhythm, normal S1 and S2, no S3.  Abdomen:  Soft, non-tender. Extremities:  1+ edema present. No cyanosis. No clubbing. CNS: AxOx3, Cranial nerves grossly intact, moves all 4 extremities. Right handed. Skin: Warm and dry.   Intake/Output from previous day: 07/11 0701 - 07/12 0700 In: 871.2 [P.O.:720; I.V.:151.2] Out: 1851 [Urine:1850; Stool:1]    Lab Results: BMET    Component Value Date/Time   NA 138 08/28/2011 0635   K 3.2* 08/28/2011 0635   CL 101 08/28/2011 0635   CO2 25 08/28/2011 0635   GLUCOSE 121* 08/28/2011 0635   BUN 29* 08/28/2011 0635   CREATININE 1.23 08/28/2011 0635   CALCIUM 9.0 08/28/2011 0635   GFRNONAA 62* 08/28/2011 0635   GFRAA 72* 08/28/2011 0635   CBC    Component Value Date/Time   WBC 7.2 08/28/2011 0635   RBC 3.82* 08/28/2011 0635   HGB 12.6* 08/28/2011 0635   HCT 37.2* 08/28/2011 0635   PLT 151 08/28/2011 0635   MCV 97.4 08/28/2011 0635   MCH 33.0 08/28/2011 0635   MCHC 33.9 08/28/2011 0635   RDW 13.1 08/28/2011 0635   LYMPHSABS 1.2 11/10/2010 1019   MONOABS 0.7 11/10/2010 1019   EOSABS 0.3  11/10/2010 1019   BASOSABS 0.1 11/10/2010 1019   CARDIAC ENZYMES Lab Results  Component Value Date   CKTOTAL 94 08/24/2011   CKMB 6.3* 08/24/2011   TROPONINI <0.30 08/24/2011    Assessment/Plan:  Patient Active Hospital Problem List: 1. Acute on chronic left heart systolic failure-Improving.  2. Chest pain, non-cardiac  3. Hypertension.  4. Dilated cardiomyopathy.  5. Chronic renal insufficiency.-CKD-II  6. Hypokalemia- K+ supplement.  Increase activity   LOS: 5 days    [name]  MD  08/28/2011, 9:33 AM

## 2011-08-28 NOTE — Progress Notes (Signed)
Patient ambulated 25 feet in the hall and became short of breath with increasing generalized body pain.  Patient returned to room and oxygen saturation on room air was 97%, patient still C/O SOB.  Pain medication and repositioning offered but patient refused.  Will continue to monitor. Nolon Nations

## 2011-08-29 NOTE — Progress Notes (Signed)
Subjective:  Patient denies any chest pain states breathing has improved and leg swelling has markedly improved  Objective:  Vital Signs in the last 24 hours: Temp:  [98.6 F (37 C)-99.1 F (37.3 C)] 98.8 F (37.1 C) (07/13 0538) Pulse Rate:  [88-112] 88  (07/13 0538) Resp:  [18-20] 20  (07/13 0538) BP: (103-135)/(72-90) 103/72 mmHg (07/13 0538) SpO2:  [96 %-100 %] 98 % (07/13 0538) Weight:  [71.215 kg (157 lb)] 71.215 kg (157 lb) (07/13 0538)  Intake/Output from previous day: 07/12 0701 - 07/13 0700 In: 1260.9 [P.O.:1200; I.V.:60.9] Out: 2801 [Urine:2800; Stool:1] Intake/Output from this shift: Total I/O In: 480 [P.O.:480] Out: 425 [Urine:425]  Physical Exam: Neck: no adenopathy, no carotid bruit, no JVD and supple, symmetrical, trachea midline Lungs: Decreased breath sound at bases with occasional rales Heart: regular rate and rhythm, S1, S2 normal and 2/6 systolic murmur and soft S3 gallop noted Abdomen: soft, non-tender; bowel sounds normal; no masses,  no organomegaly Extremities: No clubbing cyanosis 1+ edema noted  Lab Results:  Basename 08/28/11 0635  WBC 7.2  HGB 12.6*  PLT 151    Basename 08/28/11 0635  NA 138  K 3.2*  CL 101  CO2 25  GLUCOSE 121*  BUN 29*  CREATININE 1.23   No results found for this basename: TROPONINI:2,CK,MB:2 in the last 72 hours Hepatic Function Panel No results found for this basename: PROT,ALBUMIN,AST,ALT,ALKPHOS,BILITOT,BILIDIR,IBILI in the last 72 hours No results found for this basename: CHOL in the last 72 hours No results found for this basename: PROTIME in the last 72 hours  Imaging: Imaging results have been reviewed and No results found.  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

## 2011-08-30 LAB — BASIC METABOLIC PANEL
BUN: 34 mg/dL — ABNORMAL HIGH (ref 6–23)
Calcium: 9.1 mg/dL (ref 8.4–10.5)
GFR calc Af Amer: 67 mL/min — ABNORMAL LOW (ref >90)
GFR calc non Af Amer: 58 mL/min — ABNORMAL LOW (ref >90)
Glucose, Bld: 107 mg/dL — ABNORMAL HIGH (ref 70–99)
Potassium: 3.2 mEq/L — ABNORMAL LOW (ref 3.5–5.1)
Sodium: 134 mEq/L — ABNORMAL LOW (ref 135–145)

## 2011-08-30 LAB — CBC
HCT: 36.2 % — ABNORMAL LOW (ref 39.0–52.0)
MCH: 32.3 pg (ref 26.0–34.0)
MCHC: 33.1 g/dL (ref 30.0–36.0)
RDW: 12.9 % (ref 11.5–15.5)

## 2011-08-30 MED ORDER — POTASSIUM CHLORIDE CRYS ER 20 MEQ PO TBCR
40.0000 meq | EXTENDED_RELEASE_TABLET | Freq: Once | ORAL | Status: AC
  Administered 2011-08-30: 40 meq via ORAL
  Filled 2011-08-30: qty 2

## 2011-08-30 NOTE — Progress Notes (Signed)
Subjective:  Patient denies any chest pain states breathing is improved  Objective:  Vital Signs in the last 24 hours: Temp:  [97.8 F (36.6 C)-98.3 F (36.8 C)] 97.8 F (36.6 C) (07/14 0500) Pulse Rate:  [73-102] 73  (07/14 0500) Resp:  [18] 18  (07/14 0500) BP: (102-113)/(66-79) 107/71 mmHg (07/14 1005) SpO2:  [94 %-98 %] 97 % (07/14 0500) Weight:  [67.7 kg (149 lb 4 oz)] 67.7 kg (149 lb 4 oz) (07/14 0500)  Intake/Output from previous day: 07/13 0701 - 07/14 0700 In: 923 [P.O.:920; I.V.:3] Out: 2425 [Urine:2425] Intake/Output from this shift: Total I/O In: 3 [I.V.:3] Out: 800 [Urine:800]  Physical Exam: Neck: no adenopathy, no carotid bruit, no JVD and supple, symmetrical, trachea midline Lungs: Decreased breath sound at bases with occasional rales Heart: regular rate and rhythm, S1, S2 normal and 2/6 systolic murmur and soft S3 gallop noted Abdomen: soft, non-tender; bowel sounds normal; no masses,  no organomegaly Extremities: No clubbing cyanosis trace edema noted  Lab Results:  Basename 08/30/11 0526 08/28/11 0635  WBC 10.7* 7.2  HGB 12.0* 12.6*  PLT 184 151    Basename 08/30/11 0526 08/28/11 0635  NA 134* 138  K 3.2* 3.2*  CL 93* 101  CO2 29 25  GLUCOSE 107* 121*  BUN 34* 29*  CREATININE 1.30 1.23   No results found for this basename: TROPONINI:2,CK,MB:2 in the last 72 hours Hepatic Function Panel No results found for this basename: PROT,ALBUMIN,AST,ALT,ALKPHOS,BILITOT,BILIDIR,IBILI in the last 72 hours No results found for this basename: CHOL in the last 72 hours No results found for this basename: PROTIME in the last 72 hours  Imaging: Imaging results have been reviewed and No results found.  Cardiac Studies:  Assessment/Plan:  Resolving acute on chronic systolic heart failure  Status post atypical chest pain rule out GERD rule out peptic ulcer disease  Hypertension  Nonischemic dilated cardiomyopathy  Chronic kidney disease stage II improved    Hypokalemia  Plan Replace K. Check labs in a.m.  LOS: 7 days    [name] 08/30/2011, 10:55 AM

## 2011-08-31 LAB — BASIC METABOLIC PANEL
CO2: 32 mEq/L (ref 19–32)
Calcium: 9.5 mg/dL (ref 8.4–10.5)
Creatinine, Ser: 1.4 mg/dL — ABNORMAL HIGH (ref 0.50–1.35)
GFR calc Af Amer: 61 mL/min — ABNORMAL LOW (ref >90)
GFR calc non Af Amer: 53 mL/min — ABNORMAL LOW (ref >90)
Sodium: 134 mEq/L — ABNORMAL LOW (ref 135–145)

## 2011-08-31 LAB — CBC
MCH: 32.8 pg (ref 26.0–34.0)
MCV: 98.4 fL (ref 78.0–100.0)
Platelets: 211 10*3/uL (ref 150–400)
RBC: 3.81 MIL/uL — ABNORMAL LOW (ref 4.22–5.81)
RDW: 12.9 % (ref 11.5–15.5)

## 2011-08-31 LAB — MAGNESIUM: Magnesium: 1.5 mg/dL (ref 1.5–2.5)

## 2011-08-31 MED ORDER — ASPIRIN 81 MG PO TBEC
81.0000 mg | DELAYED_RELEASE_TABLET | Freq: Every day | ORAL | Status: DC
Start: 2011-08-31 — End: 2012-05-13

## 2011-08-31 MED ORDER — POTASSIUM CHLORIDE CRYS ER 20 MEQ PO TBCR: 20.0000 meq | EXTENDED_RELEASE_TABLET | Freq: Two times a day (BID) | ORAL | Status: DC

## 2011-08-31 MED ORDER — DILTIAZEM HCL 30 MG PO TABS: 60.0000 mg | ORAL_TABLET | Freq: Two times a day (BID) | ORAL | Status: DC

## 2011-08-31 NOTE — Progress Notes (Signed)
Patient discharge orders received and education completed. Patient hemodynamically stable with no signs and symptoms of distress. Patient discharged to home with wife. [name], [name]

## 2011-08-31 NOTE — Progress Notes (Signed)
UR Completed Advaith Lamarque Graves-Bigelow, RN,BSN [phone]  

## 2011-08-31 NOTE — Discharge Summary (Signed)
Physician Discharge Summary  Patient ID: ALTAN KRAAI MRN: [id number] DOB/AGE: 61-23-1952 61 y.o.  Admit date: 08/23/2011 Discharge date: 08/31/2011  Admission Diagnoses: 1. Acute on chronic left heart systolic failure-Improving.  2. Chest pain, non-cardiac  3. Hypertension.  4. Dilated cardiomyopathy.  5. Chronic renal insufficiency.-CKD-II  6. Hypokalemia- K+ supplement.  Discharge Diagnoses:  Principle Problem:  * 1. Acute on chronic left heart systolic failure-Resolved*.  2. Chest pain, non-cardiac  3. Hypertension.  4. Dilated cardiomyopathy.  5. Chronic renal insufficiency.-CKD-II  6. Hypokalemia-improved 7. Atrial fibrillation  Discharged Condition: good  Hospital Course: 61 years old male with non-ischemic cardiomyopathy presented with leg edema and shortness of breath. He responded very well to IV lasix and inotropic medications. His renal function improved in spite of diuresis. He needed K+ replacement for hypokalemia due to Lasix use. He had normal coronaries in Dec 2012. His activity was increased and discharged home in satisfactory condition with follow up by me in 1 week. He has h/o GI bleed and CKD, II which precludes use of Coumadin and ACE inhibitor.  Consults: None  Significant Diagnostic Studies: labs: Normal CBC, Normal electrolytes and elevated BUN/Cr of 53/2.26 on admission, followed by hypokalemia (3.4 meq. On discharge) and improved BUN/Cr to 32/1.4  Treatments: cardiac meds: Nebivolol (Bystolic), diltiazem, digoxin and furosemide.  Discharge Exam: Blood pressure 109/64, pulse 106, temperature 97.9 F (36.6 C), temperature source Oral, resp. rate 18, height 5\' 9"  (1.753 m), weight 67.7 kg (149 lb 4 oz), SpO2 98.00%. Constitutional: He appears well-developed and well-nourished.  HENT: Head: Normocephalic and atraumatic. Eyes: Brown eyes, Conjunctivae and EOM are normal. Pupils are equal, round, and reactive to light.  Neck: Normal range of motion. Neck  supple. Normal carotid pulses and + JVD at 30 degree angle present. Carotid bruit is not present. No rigidity. Normal range of motion present.  Cardiovascular: S1 normal, S2 normal and normal pulses. Exam reveals no gallop and no friction rub. II/VI systolic murmur at left sternal border. Pulmonary/Chest: Lungs clear to auscultation.  Abdominal: Bowel sounds are normal. Non-tender.  Musculoskeletal: Normal range of motion.  Neurological: He is alert and oriented to person, place, and time.  Skin: Skin is warm, dry and intact. No rash noted. No cyanosis. + clubbing.  Psychiatric: He has a normal mood and affect.   Disposition: 01-Home or Self Care   Medication List  As of 08/31/2011  9:06 AM   STOP taking these medications         aspirin 325 MG tablet      bismuth subsalicylate 262 MG chewable tablet         TAKE these medications         albuterol 108 (90 BASE) MCG/ACT inhaler   Commonly known as: PROVENTIL HFA;VENTOLIN HFA   Inhale 2 puffs into the lungs every 6 (six) hours as needed. For shortness of breath      aspirin 81 MG EC tablet   Take 1 tablet (81 mg total) by mouth daily.      diltiazem 30 MG tablet   Commonly known as: CARDIZEM   Take 2 tablets (60 mg total) by mouth 2 (two) times daily.      esomeprazole 40 MG capsule   Commonly known as: NEXIUM   Take 40 mg by mouth daily before breakfast.      furosemide 40 MG tablet   Commonly known as: LASIX   Take 40 mg by mouth daily.      HYDROcodone-acetaminophen 5-325  MG per tablet   Commonly known as: NORCO   Take 1 tablet by mouth at bedtime. For pain      isosorbide-hydrALAZINE 20-37.5 MG per tablet   Commonly known as: BIDIL   Take 0.5-1 tablets by mouth 3 (three) times daily.      multivitamins ther. w/minerals Tabs   Take 0.5 tablets by mouth 2 (two) times daily.      nebivolol 10 MG tablet   Commonly known as: BYSTOLIC   Take 5 mg by mouth 2 (two) times daily.      potassium chloride SA 20 MEQ  tablet   Commonly known as: K-DUR,KLOR-CON   Take 1 tablet (20 mEq total) by mouth 2 (two) times daily.           Follow-up Information    Follow up with [facility], MD. Schedule an appointment as soon as possible for a visit in 1 week.   Contact information:   [address] [place] [address] [phone]          Signed: Ricki Rodriguez 08/31/2011, 9:06 AM

## 2011-09-08 ENCOUNTER — Emergency Department (HOSPITAL_COMMUNITY): Payer: Medicare Other

## 2011-09-08 ENCOUNTER — Inpatient Hospital Stay (HOSPITAL_COMMUNITY)
Admission: AD | Admit: 2011-09-08 | Discharge: 2011-09-22 | DRG: 237 | Disposition: A | Discharge status: Home / Self Care | Payer: Medicare Other | Attending: Cardiovascular Disease | Admitting: Cardiovascular Disease

## 2011-09-08 ENCOUNTER — Encounter (HOSPITAL_COMMUNITY): Payer: Self-pay

## 2011-09-08 DIAGNOSIS — I2789 Other specified pulmonary heart diseases: Secondary | ICD-10-CM | POA: Diagnosis present

## 2011-09-08 DIAGNOSIS — I471 Supraventricular tachycardia, unspecified: Secondary | ICD-10-CM

## 2011-09-08 DIAGNOSIS — I1310 Hypertensive heart and chronic kidney disease without heart failure, with stage 1 through stage 4 chronic kidney disease, or unspecified chronic kidney disease: Secondary | ICD-10-CM

## 2011-09-08 DIAGNOSIS — I130 Hypertensive heart and chronic kidney disease with heart failure and stage 1 through stage 4 chronic kidney disease, or unspecified chronic kidney disease: Principal | ICD-10-CM | POA: Diagnosis present

## 2011-09-08 DIAGNOSIS — K449 Diaphragmatic hernia without obstruction or gangrene: Secondary | ICD-10-CM | POA: Diagnosis present

## 2011-09-08 DIAGNOSIS — I079 Rheumatic tricuspid valve disease, unspecified: Secondary | ICD-10-CM | POA: Diagnosis present

## 2011-09-08 DIAGNOSIS — K2940 Chronic atrophic gastritis without bleeding: Secondary | ICD-10-CM | POA: Diagnosis present

## 2011-09-08 DIAGNOSIS — I5023 Acute on chronic systolic (congestive) heart failure: Secondary | ICD-10-CM | POA: Diagnosis present

## 2011-09-08 DIAGNOSIS — R570 Cardiogenic shock: Secondary | ICD-10-CM

## 2011-09-08 DIAGNOSIS — I272 Pulmonary hypertension, unspecified: Secondary | ICD-10-CM | POA: Diagnosis present

## 2011-09-08 DIAGNOSIS — E871 Hypo-osmolality and hyponatremia: Secondary | ICD-10-CM | POA: Diagnosis not present

## 2011-09-08 DIAGNOSIS — Z9119 Patient's noncompliance with other medical treatment and regimen: Secondary | ICD-10-CM

## 2011-09-08 DIAGNOSIS — I498 Other specified cardiac arrhythmias: Secondary | ICD-10-CM | POA: Diagnosis present

## 2011-09-08 DIAGNOSIS — K219 Gastro-esophageal reflux disease without esophagitis: Secondary | ICD-10-CM | POA: Diagnosis present

## 2011-09-08 DIAGNOSIS — I4719 Other supraventricular tachycardia: Secondary | ICD-10-CM

## 2011-09-08 DIAGNOSIS — R079 Chest pain, unspecified: Secondary | ICD-10-CM

## 2011-09-08 DIAGNOSIS — N179 Acute kidney failure, unspecified: Secondary | ICD-10-CM | POA: Diagnosis present

## 2011-09-08 DIAGNOSIS — G540 Brachial plexus disorders: Secondary | ICD-10-CM | POA: Diagnosis present

## 2011-09-08 DIAGNOSIS — F17200 Nicotine dependence, unspecified, uncomplicated: Secondary | ICD-10-CM | POA: Diagnosis present

## 2011-09-08 DIAGNOSIS — I509 Heart failure, unspecified: Secondary | ICD-10-CM | POA: Diagnosis present

## 2011-09-08 DIAGNOSIS — Z91199 Patient's noncompliance with other medical treatment and regimen due to unspecified reason: Secondary | ICD-10-CM

## 2011-09-08 DIAGNOSIS — I428 Other cardiomyopathies: Secondary | ICD-10-CM | POA: Diagnosis present

## 2011-09-08 DIAGNOSIS — N182 Chronic kidney disease, stage 2 (mild): Secondary | ICD-10-CM | POA: Diagnosis present

## 2011-09-08 DIAGNOSIS — N189 Chronic kidney disease, unspecified: Principal | ICD-10-CM | POA: Diagnosis present

## 2011-09-08 DIAGNOSIS — I059 Rheumatic mitral valve disease, unspecified: Secondary | ICD-10-CM | POA: Diagnosis present

## 2011-09-08 HISTORY — DX: Unspecified atrial fibrillation: I48.91

## 2011-09-08 HISTORY — DX: Chronic obstructive pulmonary disease, unspecified: J44.9

## 2011-09-08 HISTORY — DX: Pneumonia, unspecified organism: J18.9

## 2011-09-08 HISTORY — DX: Chronic kidney disease, stage 2 (mild): N18.2

## 2011-09-08 LAB — CBC
MCHC: 32.8 g/dL (ref 30.0–36.0)
RDW: 13.8 % (ref 11.5–15.5)

## 2011-09-08 LAB — CARDIAC PANEL(CRET KIN+CKTOT+MB+TROPI)
CK, MB: 3.8 ng/mL (ref 0.3–4.0)
Relative Index: INVALID (ref 0.0–2.5)
Relative Index: INVALID (ref 0.0–2.5)
Relative Index: INVALID (ref 0.0–2.5)
Total CK: 67 U/L (ref 7–232)
Troponin I: <0.3 ng/mL (ref <0.30)

## 2011-09-08 LAB — POCT I-STAT TROPONIN I: Troponin i, poc: 0.08 ng/mL (ref 0.00–0.08)

## 2011-09-08 LAB — BASIC METABOLIC PANEL
BUN: 39 mg/dL — ABNORMAL HIGH (ref 6–23)
Calcium: 9.4 mg/dL (ref 8.4–10.5)
GFR calc Af Amer: 39 mL/min — ABNORMAL LOW (ref >90)
GFR calc non Af Amer: 34 mL/min — ABNORMAL LOW (ref >90)
Potassium: 5.1 mEq/L (ref 3.5–5.1)
Sodium: 139 mEq/L (ref 135–145)

## 2011-09-08 LAB — APTT: aPTT: 29 seconds (ref 24–37)

## 2011-09-08 LAB — HEPATIC FUNCTION PANEL
AST: 37 U/L (ref 0–37)
Alkaline Phosphatase: 108 U/L (ref 39–117)
Bilirubin, Direct: 1.1 mg/dL — ABNORMAL HIGH (ref 0.0–0.3)
Total Bilirubin: 1.9 mg/dL — ABNORMAL HIGH (ref 0.3–1.2)

## 2011-09-08 LAB — PROTIME-INR: INR: 1.24 (ref 0.00–1.49)

## 2011-09-08 MED ORDER — ASPIRIN 300 MG RE SUPP
300.0000 mg | RECTAL | Status: AC
  Filled 2011-09-08: qty 1

## 2011-09-08 MED ORDER — HEPARIN BOLUS VIA INFUSION
1750.0000 [IU] | Freq: Once | INTRAVENOUS | Status: AC
  Administered 2011-09-08: 1750 [IU] via INTRAVENOUS
  Filled 2011-09-08: qty 1750

## 2011-09-08 MED ORDER — ISOSORBIDE MONONITRATE ER 30 MG PO TB24
30.0000 mg | ORAL_TABLET | Freq: Every day | ORAL | Status: DC
  Administered 2011-09-08 – 2011-09-10 (×3): 30 mg via ORAL
  Filled 2011-09-08 (×3): qty 1

## 2011-09-08 MED ORDER — FUROSEMIDE 10 MG/ML IJ SOLN
80.0000 mg | Freq: Once | INTRAMUSCULAR | Status: AC
  Administered 2011-09-08: 80 mg via INTRAVENOUS
  Filled 2011-09-08: qty 8

## 2011-09-08 MED ORDER — ISOSORB DINITRATE-HYDRALAZINE 20-37.5 MG PO TABS: 0.5000 | ORAL_TABLET | Freq: Three times a day (TID) | ORAL | Status: DC

## 2011-09-08 MED ORDER — DILTIAZEM HCL 60 MG PO TABS
60.0000 mg | ORAL_TABLET | Freq: Two times a day (BID) | ORAL | Status: DC
Start: 2011-09-08 — End: 2011-09-09
  Administered 2011-09-08 – 2011-09-09 (×3): 60 mg via ORAL
  Filled 2011-09-08 (×4): qty 1

## 2011-09-08 MED ORDER — CALCIUM CARBONATE ANTACID 500 MG PO CHEW
2.0000 | CHEWABLE_TABLET | Freq: Four times a day (QID) | ORAL | Status: DC | PRN
  Administered 2011-09-08: 400 mg via ORAL
  Filled 2011-09-08: qty 2

## 2011-09-08 MED ORDER — CLOPIDOGREL BISULFATE 75 MG PO TABS
75.0000 mg | ORAL_TABLET | Freq: Every day | ORAL | Status: DC
  Administered 2011-09-09 – 2011-09-11 (×3): 75 mg via ORAL
  Filled 2011-09-08 (×5): qty 1

## 2011-09-08 MED ORDER — FUROSEMIDE 10 MG/ML IJ SOLN: 60.0000 mg | Freq: Once | INTRAMUSCULAR | Status: DC

## 2011-09-08 MED ORDER — ONDANSETRON HCL 4 MG/2ML IJ SOLN: 4.0000 mg | Freq: Four times a day (QID) | INTRAMUSCULAR | Status: DC | PRN

## 2011-09-08 MED ORDER — ASPIRIN EC 81 MG PO TBEC
81.0000 mg | DELAYED_RELEASE_TABLET | Freq: Every day | ORAL | Status: DC
  Administered 2011-09-09 – 2011-09-22 (×13): 81 mg via ORAL
  Filled 2011-09-08 (×14): qty 1

## 2011-09-08 MED ORDER — SODIUM CHLORIDE 0.9 % IJ SOLN
3.0000 mL | Freq: Two times a day (BID) | INTRAMUSCULAR | Status: DC
  Administered 2011-09-08 – 2011-09-10 (×4): 3 mL via INTRAVENOUS

## 2011-09-08 MED ORDER — FUROSEMIDE 40 MG PO TABS
40.0000 mg | ORAL_TABLET | Freq: Every day | ORAL | Status: DC
  Administered 2011-09-08 – 2011-09-10 (×3): 40 mg via ORAL
  Filled 2011-09-08 (×3): qty 2

## 2011-09-08 MED ORDER — MORPHINE SULFATE 2 MG/ML IJ SOLN
2.0000 mg | Freq: Once | INTRAMUSCULAR | Status: AC
  Administered 2011-09-08: 2 mg via INTRAVENOUS
  Filled 2011-09-08: qty 1

## 2011-09-08 MED ORDER — NITROGLYCERIN 0.4 MG SL SUBL: 0.4000 mg | SUBLINGUAL_TABLET | SUBLINGUAL | Status: DC | PRN

## 2011-09-08 MED ORDER — SODIUM CHLORIDE 0.9 % IJ SOLN: 3.0000 mL | INTRAMUSCULAR | Status: DC | PRN

## 2011-09-08 MED ORDER — HEPARIN (PORCINE) IN NACL 100-0.45 UNIT/ML-% IJ SOLN
1200.0000 [IU]/h | INTRAMUSCULAR | Status: DC
  Administered 2011-09-08: 950 [IU]/h via INTRAVENOUS
  Filled 2011-09-08 (×2): qty 250

## 2011-09-08 MED ORDER — ASPIRIN 81 MG PO CHEW
324.0000 mg | CHEWABLE_TABLET | ORAL | Status: AC
  Filled 2011-09-08: qty 4

## 2011-09-08 MED ORDER — HYDROCODONE-ACETAMINOPHEN 5-325 MG PO TABS
1.0000 | ORAL_TABLET | Freq: Every day | ORAL | Status: DC
  Administered 2011-09-08 – 2011-09-10 (×2): 1 via ORAL
  Filled 2011-09-08 (×2): qty 1

## 2011-09-08 MED ORDER — ACETAMINOPHEN 325 MG PO TABS
650.0000 mg | ORAL_TABLET | ORAL | Status: DC | PRN
  Administered 2011-09-08 – 2011-09-10 (×2): 650 mg via ORAL
  Filled 2011-09-08 (×2): qty 2

## 2011-09-08 MED ORDER — HEPARIN BOLUS VIA INFUSION
3500.0000 [IU] | Freq: Once | INTRAVENOUS | Status: AC
  Administered 2011-09-08: 3500 [IU] via INTRAVENOUS
  Filled 2011-09-08: qty 3500

## 2011-09-08 MED ORDER — ALBUTEROL SULFATE HFA 108 (90 BASE) MCG/ACT IN AERS
2.0000 | INHALATION_SPRAY | Freq: Four times a day (QID) | RESPIRATORY_TRACT | Status: DC | PRN
  Administered 2011-09-09 – 2011-09-11 (×3): 2 via RESPIRATORY_TRACT
  Filled 2011-09-08 (×2): qty 6.7

## 2011-09-08 MED ORDER — SODIUM CHLORIDE 0.9 % IV SOLN
250.0000 mL | INTRAVENOUS | Status: DC | PRN
  Administered 2011-09-08: 250 mL via INTRAVENOUS
  Administered 2011-09-12: 10 mL via INTRAVENOUS
  Administered 2011-09-13: 250 mL via INTRAVENOUS

## 2011-09-08 MED ORDER — HEPARIN (PORCINE) IN NACL 100-0.45 UNIT/ML-% IJ SOLN
750.0000 [IU]/h | INTRAMUSCULAR | Status: DC
  Administered 2011-09-08: 750 [IU]/h via INTRAVENOUS
  Filled 2011-09-08: qty 250

## 2011-09-08 MED ORDER — PANTOPRAZOLE SODIUM 40 MG PO TBEC
40.0000 mg | DELAYED_RELEASE_TABLET | Freq: Every day | ORAL | Status: DC
  Administered 2011-09-08 – 2011-09-14 (×7): 40 mg via ORAL
  Filled 2011-09-08 (×7): qty 1

## 2011-09-08 NOTE — Progress Notes (Signed)
ANTICOAGULATION CONSULT NOTE - Follow Up Consult  Pharmacy Consult for heparin Indication: chest pain/ACS  No Known Allergies  Patient Measurements: Height: 5\' 9"  (175.3 cm) Weight: 137 lb 9.1 oz (62.4 kg) (Scale A) IBW/kg (Calculated) : 70.7  Heparin Dosing Weight: 62.4 kg  Vital Signs: Temp: 97.5 F (36.4 C) (07/23 1800) Temp src: Oral (07/23 1800) BP: 121/86 mmHg (07/23 1800) Pulse Rate: 66  (07/23 1800)  Labs:  Basename 09/08/11 1739 09/08/11 1355 09/08/11 1125 09/08/11 0824 09/08/11 0244  HGB -- -- -- -- 12.3*  HCT -- -- -- -- 37.5*  PLT -- -- -- -- 480*  APTT -- -- 29 -- --  LABPROT -- -- 15.9* -- --  INR -- -- 1.24 -- --  HEPARINUNFRC <0.10* -- -- -- --  CREATININE -- -- -- -- 2.03*  CKTOTAL -- 67 -- 77 --  CKMB -- 3.8 -- 4.2* --  TROPONINI -- <0.30 -- <0.30 --    Estimated Creatinine Clearance: 33.7 ml/min (by C-G formula based on Cr of 2.03).   Medications:  Scheduled:    . aspirin  324 mg Oral NOW   Or  . aspirin  300 mg Rectal NOW  . aspirin EC  81 mg Oral Daily  . clopidogrel  75 mg Oral Q breakfast  . diltiazem  60 mg Oral BID  . furosemide  40 mg Oral Daily  . heparin  1,750 Units Intravenous Once  . heparin  3,500 Units Intravenous Once  . HYDROcodone-acetaminophen  1 tablet Oral QHS  . isosorbide mononitrate  30 mg Oral Daily  .  morphine injection  2 mg Intravenous Once  . pantoprazole  40 mg Oral Daily  . sodium chloride  3 mL Intravenous Q12H  . DISCONTD: furosemide  60 mg Intravenous Once  . DISCONTD: isosorbide-hydrALAZINE  0.5-1 tablet Oral TID   Infusions:    . heparin    . DISCONTD: heparin 750 Units/hr (09/08/11 1324)    Assessment: 61 yo male with CP/ACS is currently on subtherapeutic heparin.  Heparin level was <0.1.  Per RN, the heparin was never turned off.  Goal of Therapy:  Heparin level 0.3-0.7 units/ml Monitor platelets by anticoagulation protocol: Yes   Plan:  1) Heparin 1750 units iv bolus x1, then increase  heparin drip to 950 units/hr. 2) Check an 8 hour heparin level.  Derrel Moore, Tsz-Yin 09/08/2011,6:39 PM

## 2011-09-08 NOTE — ED Notes (Signed)
Patient recently d/c'd from inpatient hospitalization on 08/31/11 for non-ischemic cardiomyopathy, sob and leg edema. Presents today with shortness of breath, CP (2/10), headache, dizziness and N/V.   Received ASA 324 mg, Nitro 0.4 mg x 1. En route. EKG sinus arrythmia. IV 20 g RH. AAOx4, resp e/u, nad.

## 2011-09-08 NOTE — ED Provider Notes (Signed)
History     CSN: [id number]  Arrival date & time 09/08/11  0222   First MD Initiated Contact with Patient 09/08/11 [phone]      Chief Complaint  Patient presents with  . Chest Pain    (Consider location/radiation/quality/duration/timing/severity/associated sxs/prior treatment) HPI Pt p/w gasping for breath when trying to sleep. No fever, chills. Recently admitted for CHF exacerbation. No lower ext swelling. C/O epigastric discomfort and mild chest pain. Pt is very poor historian,  Past Medical History  Diagnosis Date  . Hypertension   . GERD (gastroesophageal reflux disease)   . Hiatal hernia   . CHF (congestive heart failure)   . Cardiomyopathy   . Shortness of breath     Past Surgical History  Procedure Date  . Cardiac catheterization     No family history on file.  History  Substance Use Topics  . Smoking status: Current Everyday Smoker -- 0.2 packs/day for 15 years    Types: Cigarettes  . Smokeless tobacco: Never Used  . Alcohol Use: Yes     ocassional      Review of Systems  Constitutional: Negative for fever and chills.  Respiratory: Positive for shortness of breath. Negative for cough and wheezing.   Cardiovascular: Positive for chest pain. Negative for palpitations and leg swelling.  Gastrointestinal: Positive for nausea, vomiting and abdominal pain. Negative for diarrhea and constipation.  Musculoskeletal: Negative for back pain.  Skin: Negative for rash and wound.  Neurological: Negative for weakness and numbness.    Allergies  Review of patient's allergies indicates no known allergies.  Home Medications   Current Outpatient Rx  Name Route Sig Dispense Refill  . ALBUTEROL SULFATE HFA 108 (90 BASE) MCG/ACT IN AERS Inhalation Inhale 2 puffs into the lungs every 6 (six) hours as needed. For shortness of breath    . ASPIRIN 81 MG PO TBEC Oral Take 1 tablet (81 mg total) by mouth daily.    Marland Kitchen DILTIAZEM HCL 30 MG PO TABS Oral Take 2 tablets (60 mg  total) by mouth 2 (two) times daily. 60 tablet 1  . ESOMEPRAZOLE MAGNESIUM 40 MG PO CPDR Oral Take 40 mg by mouth daily before breakfast.    . FUROSEMIDE 40 MG PO TABS Oral Take 40 mg by mouth daily.      Marland Kitchen HYDROCODONE-ACETAMINOPHEN 5-325 MG PO TABS Oral Take 1 tablet by mouth at bedtime. For pain    . ISOSORB DINITRATE-HYDRALAZINE 20-37.5 MG PO TABS Oral Take 0.5-1 tablets by mouth 3 (three) times daily.     Carma Leaven M PLUS PO TABS Oral Take 0.5 tablets by mouth 2 (two) times daily.     . NEBIVOLOL HCL 10 MG PO TABS Oral Take 5 mg by mouth 2 (two) times daily.     Marland Kitchen POTASSIUM CHLORIDE CRYS ER 20 MEQ PO TBCR Oral Take 1 tablet (20 mEq total) by mouth 2 (two) times daily. 60 tablet 1    BP 109/84  Pulse 80  Temp 97.2 F (36.2 C) (Oral)  Resp 23  SpO2 99%  Physical Exam  Nursing note and vitals reviewed. Constitutional: He is oriented to person, place, and time. He appears well-developed and well-nourished. No distress.  HENT:  Head: Normocephalic and atraumatic.  Mouth/Throat: Oropharynx is clear and moist.  Eyes: EOM are normal. Pupils are equal, round, and reactive to light.  Neck: Normal range of motion. Neck supple.  Cardiovascular: Normal rate and regular rhythm.   Pulmonary/Chest: Effort normal and breath sounds normal. No  respiratory distress. He has no wheezes. He has no rales. He exhibits no tenderness.  Abdominal: Soft. Bowel sounds are normal. He exhibits no distension and no mass. There is tenderness (mild epigastric TTP). There is no rebound and no guarding.  Musculoskeletal: Normal range of motion. He exhibits no edema and no tenderness.  Neurological: He is alert and oriented to person, place, and time.       5/5 motor, sensation intact  Skin: Skin is warm and dry. No rash noted. No erythema.  Psychiatric: He has a normal mood and affect. His behavior is normal.    ED Course  Procedures (including critical care time)  Labs Reviewed  CBC - Abnormal; Notable for the  following:    RBC 3.72 (*)     Hemoglobin 12.3 (*)     HCT 37.5 (*)     MCV 100.8 (*)     Platelets 480 (*)     All other components within normal limits  BASIC METABOLIC PANEL - Abnormal; Notable for the following:    Glucose, Bld 107 (*)     BUN 39 (*)     Creatinine, Ser 2.03 (*)     GFR calc non Af Amer 34 (*)     GFR calc Af Amer 39 (*)     All other components within normal limits  PRO B NATRIURETIC PEPTIDE - Abnormal; Notable for the following:    Pro B Natriuretic peptide (BNP) 19404.0 (*)     All other components within normal limits  HEPATIC FUNCTION PANEL - Abnormal; Notable for the following:    Albumin 3.0 (*)     Total Bilirubin 1.9 (*)     Bilirubin, Direct 1.1 (*)     All other components within normal limits  POCT I-STAT TROPONIN I  LIPASE, BLOOD   Dg Abd 1 View  09/08/2011  *RADIOLOGY REPORT*  Clinical Data: Epigastric abdominal pain.  ABDOMEN - 1 VIEW  Comparison: CT scan 08/10/2011.  Findings: The right lung base is not well visualized.  I suspect there is a small right effusion and possible infiltrate or atelectasis.  A chest x-ray may be helpful for further evaluation.  Left renal calculi are noted.  The bowel gas pattern is unremarkable.  The bony structures are intact.  Stable degenerative changes involving the lumbar spine and partial lumbarization of S1.  IMPRESSION:  1.  Possible right lower lobe process.  Recommend a dedicated chest x-ray. 2.  No plain film findings for an acute abdominal process.  Left renal calculi are again demonstrated.  Original Report Authenticated By: P. Loralie Champagne, M.D.   Chest Portable 1 View  09/08/2011  *RADIOLOGY REPORT*  Clinical Data: Chest pain.  PORTABLE CHEST - 1 VIEW  Comparison: 08/23/2011.  Findings: The heart is enlarged but stable.  The mediastinal and hilar contours are stable.  No acute pulmonary findings.  Stable scattered calcified granulomas.  IMPRESSION: Cardiac enlargement but no acute pulmonary findings.   Original Report Authenticated By: P. Loralie Champagne, M.D.     1. Chest pain at rest      Date: 09/08/2011  Rate: 68  Rhythm: normal sinus rhythm  QRS Axis: normal  Intervals: normal  ST/T Wave abnormalities: nonspecific T wave changes  Conduction Disutrbances:none  Narrative Interpretation:   Old EKG Reviewed: unchanged    MDM          Loren Racer, MD 09/08/11 [phone]

## 2011-09-08 NOTE — Progress Notes (Signed)
ANTICOAGULATION CONSULT NOTE - Initial Consult  Pharmacy Consult for Heparin Indication: chest pain/ACS  No Known Allergies  Patient Measurements: Height: 5\' 9"  (175.3 cm) Weight: 137 lb 9.1 oz (62.4 kg) (Scale A) IBW/kg (Calculated) : 70.7  1.25 IBW: 88.4kg  Vital Signs: Temp: 97.3 F (36.3 C) (07/23 0908) Temp src: Oral (07/23 0908) BP: 110/77 mmHg (07/23 0908) Pulse Rate: 68  (07/23 0908)  Labs:  Basename 09/08/11 0824 09/08/11 0244  HGB -- 12.3*  HCT -- 37.5*  PLT -- 480*  APTT -- --  LABPROT -- --  INR -- --  HEPARINUNFRC -- --  CREATININE -- 2.03*  CKTOTAL 77 --  CKMB 4.2* --  TROPONINI <0.30 --    Estimated Creatinine Clearance: 33.7 ml/min (by C-G formula based on Cr of 2.03).   Medical History: Past Medical History  Diagnosis Date  . Hypertension   . GERD (gastroesophageal reflux disease)   . Hiatal hernia   . CHF (congestive heart failure)   . Cardiomyopathy   . Shortness of breath     Medications:  Prescriptions prior to admission  Medication Sig Dispense Refill  . albuterol (PROVENTIL HFA;VENTOLIN HFA) 108 (90 BASE) MCG/ACT inhaler Inhale 2 puffs into the lungs every 6 (six) hours as needed. For shortness of breath      . aspirin EC 81 MG EC tablet Take 1 tablet (81 mg total) by mouth daily.      Marland Kitchen diltiazem (CARDIZEM) 30 MG tablet Take 2 tablets (60 mg total) by mouth 2 (two) times daily.  60 tablet  1  . esomeprazole (NEXIUM) 40 MG capsule Take 40 mg by mouth daily before breakfast.      . furosemide (LASIX) 40 MG tablet Take 40 mg by mouth daily.        Marland Kitchen HYDROcodone-acetaminophen (NORCO) 5-325 MG per tablet Take 1 tablet by mouth at bedtime. For pain      . isosorbide-hydrALAZINE (BIDIL) 20-37.5 MG per tablet Take 0.5-1 tablets by mouth 3 (three) times daily.       . Multiple Vitamins-Minerals (MULTIVITAMINS THER. W/MINERALS) TABS Take 0.5 tablets by mouth 2 (two) times daily.       . nebivolol (BYSTOLIC) 10 MG tablet Take 5 mg by mouth 2  (two) times daily.       . potassium chloride SA (K-DUR,KLOR-CON) 20 MEQ tablet Take 1 tablet (20 mEq total) by mouth 2 (two) times daily.  60 tablet  1    Assessment: Pt is a 61 yo male with a cc of substernal CP and orthopnea. Pharmacy has been consulted to dose heparin for ACS. CBC stable, SCr elevated with CrCl ~30-20mL/min. BNP 19404. Pt states no recent episodes of bleeding. Will begin heparin therapy with initial bolus followed by an infusion. PTT/PT/INR ordered.  Goal of Therapy:  Heparin level 0.3-0.7 units/ml Monitor platelets by anticoagulation protocol: Yes   Plan:  1. Give heparin 3500 units IV bolus  2. Start heparin drip at 750 units/hr after bolus 3. Heparin level 6 hours after bolus 4. Daily heparin level/CBC 5. Follow up PTT/PT/INR  Abran Duke 09/08/2011,10:34 AM

## 2011-09-08 NOTE — ED Notes (Signed)
Pt states he came in because of his hernia, pt point to upper abdomen, states that it is pressing on his abdomen and he is supposed to be having a GI appt today. Pt reports sob at night, states he thinks he has sleep apnea because he can not sleep at night. States that he had right sided discomfort to his chest prior to arrival states radiated to left chest associated with headache and nausea. Pt states these symptoms have resolved. Pt denies any swelling in his lower extremities. Lungs are clear on exam. Abdomen soft with tenderness with palpation.

## 2011-09-08 NOTE — Progress Notes (Signed)
Pt arrived to unit and has been placed on telemetry.  Vitals have been taken and are stable at this time.  Pt has been oriented to room and unit.  Pt resting comfortably in bed and appears in no acute distress.  Will continue to monitor. Nino Glow RN

## 2011-09-08 NOTE — H&P (Signed)
[name] is an 61 y.o. male.   Chief Complaint: Chest pain HPI: 61 years old male with substernal chest pain and orthopnea. No leg edema. Chest x-ray negative for pulmonary edema. Elevated BNP.  Past Medical History  Diagnosis Date  . Hypertension   . GERD (gastroesophageal reflux disease)   . Hiatal hernia   . CHF (congestive heart failure)   . Cardiomyopathy   . Shortness of breath       Past Surgical History  Procedure Date  . Cardiac catheterization 01/2011    No family history on file. Social History:  reports that he has been smoking Cigarettes.  He has a 3.75 pack-year smoking history. He has never used smokeless tobacco. He reports that he drinks alcohol. He reports that he does not use illicit drugs.  Allergies: No Known Allergies   (Not in a hospital admission)  Results for orders placed during the hospital encounter of 09/08/11 (from the past 48 hour(Snyder))  CBC     Status: Abnormal   Collection Time   09/08/11  2:44 AM      Component Value Range Comment   WBC 8.6  4.0 - 10.5 K/uL    RBC 3.72 (*) 4.22 - 5.81 MIL/uL    Hemoglobin 12.3 (*) 13.0 - 17.0 g/dL    HCT 78.2 (*) 95.6 - 52.0 %    MCV 100.8 (*) 78.0 - 100.0 fL    MCH 33.1  26.0 - 34.0 pg    MCHC 32.8  30.0 - 36.0 g/dL    RDW 21.3  08.6 - 57.8 %    Platelets 480 (*) 150 - 400 K/uL   BASIC METABOLIC PANEL     Status: Abnormal   Collection Time   09/08/11  2:44 AM      Component Value Range Comment   Sodium 139  135 - 145 mEq/L    Potassium 5.1  3.5 - 5.1 mEq/L    Chloride 105  96 - 112 mEq/L    CO2 21  19 - 32 mEq/L    Glucose, Bld 107 (*) 70 - 99 mg/dL    BUN 39 (*) 6 - 23 mg/dL    Creatinine, Ser 4.69 (*) 0.50 - 1.35 mg/dL    Calcium 9.4  8.4 - 62.9 mg/dL    GFR calc non Af Amer 34 (*) >90 mL/min    GFR calc Af Amer 39 (*) >90 mL/min   PRO B NATRIURETIC PEPTIDE     Status: Abnormal   Collection Time   09/08/11  2:44 AM      Component Value Range Comment   Pro B Natriuretic peptide (BNP)  19404.0 (*) 0 - 125 pg/mL   POCT I-STAT TROPONIN I     Status: Normal   Collection Time   09/08/11  3:18 AM      Component Value Range Comment   Troponin i, poc 0.08  0.00 - 0.08 ng/mL    Comment 3            LIPASE, BLOOD     Status: Normal   Collection Time   09/08/11  3:41 AM      Component Value Range Comment   Lipase 36  11 - 59 U/L   HEPATIC FUNCTION PANEL     Status: Abnormal   Collection Time   09/08/11  3:41 AM      Component Value Range Comment   Total Protein 6.9  6.0 - 8.3 g/dL    Albumin  3.0 (*) 3.5 - 5.2 g/dL    AST 37  0 - 37 U/L    ALT 26  0 - 53 U/L    Alkaline Phosphatase 108  39 - 117 U/L    Total Bilirubin 1.9 (*) 0.3 - 1.2 mg/dL    Bilirubin, Direct 1.1 (*) 0.0 - 0.3 mg/dL    Indirect Bilirubin 0.8  0.3 - 0.9 mg/dL    Dg Abd 1 View  0/45/4098  *RADIOLOGY REPORT*  Clinical Data: Epigastric abdominal pain.  ABDOMEN - 1 VIEW  Comparison: CT scan 08/10/2011.  Findings: The right lung base is not well visualized.  I suspect there is a small right effusion and possible infiltrate or atelectasis.  A chest x-ray may be helpful for further evaluation.  Left renal calculi are noted.  The bowel gas pattern is unremarkable.  The bony structures are intact.  Stable degenerative changes involving the lumbar spine and partial lumbarization of S1.  IMPRESSION:  1.  Possible right lower lobe process.  Recommend a dedicated chest x-ray. 2.  No plain film findings for an acute abdominal process.  Left renal calculi are again demonstrated.  Original Report Authenticated By: P. Loralie Champagne, M.D.   Chest Portable 1 View  09/08/2011  *RADIOLOGY REPORT*  Clinical Data: Chest pain.  PORTABLE CHEST - 1 VIEW  Comparison: 08/23/2011.  Findings: The heart is enlarged but stable.  The mediastinal and hilar contours are stable.  No acute pulmonary findings.  Stable scattered calcified granulomas.  IMPRESSION: Cardiac enlargement but no acute pulmonary findings.  Original Report Authenticated By:  P. Loralie Champagne, M.D.    @ROS @ Constitutional: Negative for fever and chills.  HENT: Negative for neck pain and neck stiffness.  Eyes: Negative for pain.  Respiratory: Positive for shortness of breath.  Cardiovascular: Positive for chest pain.  Gastrointestinal: Negative for abdominal pain.  Genitourinary: Negative for dysuria.  Musculoskeletal: Positive for back pain. No leg edema  Skin: Negative for rash.  Neurological: Positive for headaches.  Blood pressure 109/84, pulse 80, temperature 97.2 F (36.2 C), temperature source Oral, resp. rate 23, SpO2 99.00%. Constitutional: He appears well-developed and well-nourished.  HENT: Head: Normocephalic and atraumatic. Eyes: Brown eyes, Conjunctivae and EOM are normal. Pupils are equal, round, and reactive to light.  Neck: Normal range of motion. Neck supple. Normal carotid pulses and full JVD present. Carotid bruit is not present. No rigidity. Normal range of motion present.  Cardiovascular: S1 normal, S2 normal and normal pulses. Exam reveals no gallop and no friction rub. II/VI systolic murmur at left sternal border. Pulmonary/Chest: Mild respiratory distress but lungs clear to auscultation.  Abdominal: Bowel sounds are normal. There is epigastric tenderness.  Musculoskeletal: Normal range of motion. No edema. Neurological: He is alert and oriented to person, place, and time.  Skin: Skin is warm, dry and intact. No rash noted. No cyanosis. + clubbing.  Psychiatric: He has a normal mood and affect.    Assessment/Plan Chest pain r/o MI Chronic left heart systolic failure Dil;ated cardiomyopathy CKD, II  Telebed, cardiac enzymes, lasix, hold K+ replacement, Oxygen.  [name] 09/08/2011, 6:36 AM

## 2011-09-08 NOTE — ED Notes (Signed)
Patient has returned from radiology.  

## 2011-09-08 NOTE — ED Notes (Signed)
Patient transported to X-ray 

## 2011-09-09 DIAGNOSIS — I428 Other cardiomyopathies: Secondary | ICD-10-CM

## 2011-09-09 LAB — BASIC METABOLIC PANEL
BUN: 40 mg/dL — ABNORMAL HIGH (ref 6–23)
Calcium: 9.4 mg/dL (ref 8.4–10.5)
Creatinine, Ser: 2.18 mg/dL — ABNORMAL HIGH (ref 0.50–1.35)
GFR calc non Af Amer: 31 mL/min — ABNORMAL LOW (ref >90)
Glucose, Bld: 134 mg/dL — ABNORMAL HIGH (ref 70–99)

## 2011-09-09 LAB — CBC
MCH: 33.2 pg (ref 26.0–34.0)
MCHC: 33.5 g/dL (ref 30.0–36.0)
Platelets: 516 10*3/uL — ABNORMAL HIGH (ref 150–400)

## 2011-09-09 LAB — LIPID PANEL: Cholesterol: 140 mg/dL (ref 0–200)

## 2011-09-09 LAB — PROTIME-INR: INR: 1.24 (ref 0.00–1.49)

## 2011-09-09 MED ORDER — HEPARIN SODIUM (PORCINE) 5000 UNIT/ML IJ SOLN
5000.0000 [IU] | Freq: Three times a day (TID) | INTRAMUSCULAR | Status: DC
  Administered 2011-09-09 – 2011-09-10 (×3): 5000 [IU] via SUBCUTANEOUS
  Filled 2011-09-09 (×6): qty 1

## 2011-09-09 MED ORDER — HEPARIN BOLUS VIA INFUSION
2000.0000 [IU] | Freq: Once | INTRAVENOUS | Status: AC
  Administered 2011-09-09: 2000 [IU] via INTRAVENOUS
  Filled 2011-09-09: qty 2000

## 2011-09-09 MED ORDER — HYDROCODONE-ACETAMINOPHEN 5-325 MG PO TABS
1.0000 | ORAL_TABLET | Freq: Four times a day (QID) | ORAL | Status: DC | PRN
  Administered 2011-09-09: 1 via ORAL
  Filled 2011-09-09: qty 1

## 2011-09-09 NOTE — Progress Notes (Signed)
ANTICOAGULATION CONSULT NOTE - Follow Up Consult  Pharmacy Consult for Heparin Indication: chest pain/ACS  Assessment: 61 yo male on heparin for CP/ACS.  MI ruled out with normal cardiac enzymes.  Received verbal order from Dr. Algie Coffer to discontinue therapeutic heparin and begin heparin for DVT prophylaxis.  Plan:  1.  Discontinue heparin infusion, heparin labs, pharmacy consult. 2.  Begin heparin 5000 units [place] every 8 hours for DVT prophylaxis.  Lillia Pauls, PharmD Clinical Pharmacist Pager: [phone] Phone: [phone] 09/09/2011 8:33 AM

## 2011-09-09 NOTE — Progress Notes (Signed)
Subjective:  Feels better with TUMs and lasix use. Known dilated cardiomyopathy. Normal coronaries 7-8 months ago. EF about 25 %.   Objective:  Vital Signs in the last 24 hours: Temp:  [97.1 F (36.2 C)-97.6 F (36.4 C)] 97.5 F (36.4 C) (07/24 0628) Pulse Rate:  [60-103] 80  (07/24 0628) Cardiac Rhythm:  [-] Normal sinus rhythm (07/23 1950) Resp:  [14-20] 20  (07/24 0628) BP: (104-124)/(70-86) 104/75 mmHg (07/24 0628) SpO2:  [95 %-100 %] 100 % (07/24 0628) Weight:  [62.4 kg (137 lb 9.1 oz)-62.7 kg (138 lb 3.7 oz)] 62.7 kg (138 lb 3.7 oz) (07/24 1478)  Physical Exam: BP Readings from Last 1 Encounters:  09/09/11 104/75    Wt Readings from Last 1 Encounters:  09/09/11 62.7 kg (138 lb 3.7 oz)    Weight change:   HEENT: [place]/AT, Eyes-Brown, PERL, EOMI, Conjunctiva-Pink, Sclera-Non-icteric Neck: + JVD, No bruit, Trachea midline. Lungs:  Clear, Bilateral. Cardiac:  Regular rhythm, normal S1 and S2, no S3.  Abdomen:  Soft, non-tender. Extremities:  No edema present. No cyanosis. No clubbing. CNS: AxOx3, Cranial nerves grossly intact, moves all 4 extremities. Right handed. Skin: Warm and dry.   Intake/Output from previous day: 07/23 0701 - 07/24 0700 In: 1157 [P.O.:820; I.V.:337] Out: 2725 [Urine:2725]    Lab Results: BMET    Component Value Date/Time   NA 138 09/09/2011 0252   K 4.0 09/09/2011 0252   CL 101 09/09/2011 0252   CO2 20 09/09/2011 0252   GLUCOSE 134* 09/09/2011 0252   BUN 40* 09/09/2011 0252   CREATININE 2.18* 09/09/2011 0252   CALCIUM 9.4 09/09/2011 0252   GFRNONAA 31* 09/09/2011 0252   GFRAA 36* 09/09/2011 0252   CBC    Component Value Date/Time   WBC 7.9 09/09/2011 0252   RBC 3.83* 09/09/2011 0252   HGB 12.7* 09/09/2011 0252   HCT 37.9* 09/09/2011 0252   PLT 516* 09/09/2011 0252   MCV 99.0 09/09/2011 0252   MCH 33.2 09/09/2011 0252   MCHC 33.5 09/09/2011 0252   RDW 14.1 09/09/2011 0252   LYMPHSABS 1.2 11/10/2010 1019   MONOABS 0.7 11/10/2010 1019   EOSABS 0.3  11/10/2010 1019   BASOSABS 0.1 11/10/2010 1019   CARDIAC ENZYMES Lab Results  Component Value Date   CKTOTAL 65 09/08/2011   CKMB 3.5 09/08/2011   TROPONINI <0.30 09/08/2011    Assessment/Plan:  Patient Active Hospital Problem List: Chest pain MI ruled out with normal cardiac enzymes. Chronic left heart systolic failure, Stable Dilated cardiomyopathy,   Will ask for EP consult for ICD or Bi-V ICD CKD, II Chronic gastritis GI consult as before/Dr. Hayes/as OP  Increase activity as tolerated. No coumadin due to h/o GI bleed.   LOS: 1 day    [name]  MD  09/09/2011, 8:03 AM

## 2011-09-09 NOTE — Progress Notes (Signed)
Pt.s HR dropped to 48 non-sustained on the monitor. Surrounding that event the patients HR was in the low 100's, 100-106. Pts vital signs taken. Pt. Assessed. Dr. Algie Coffer notified. No new orders at this time. Patients HR currently stable at 61. Dolores Ewing, Melida Quitter

## 2011-09-09 NOTE — Consult Note (Addendum)
ELECTROPHYSIOLOGY CONSULT NOTE  Patient ID: [name] MRN: [id number], DOB/AGE: 05/31/1950   Admit date: 09/08/2011 Date of Consult: 09/09/2011  Primary Cardiologist: Algie Coffer, MD Reason for Consultation: Dilated cardiomyopathy; assess for ICD or BiV ICD therapy  History of Present Illness [name] is a 61 year old man with a nonischemic CM, biventricular failure, LVEF 25-30%, chronic systolic CHF, severe MR, severe TR, pulmonary hypertension, ? PAF and CKD who was admitted yesterday with chest pain. His cardiac enzymes have been negative. He underwent cardiac catheterization December 2012 which did not show any significant CAD. He was hospitalized earlier this month for acute decompensated CHF. We have been asked to see him in consultation for possible ICD therapy.  Mr. [name] reports being diagnosed with a dilated cardiomyopathy in 1997 at which time he was also using cocaine; however, he tells me there was no cause identified to his knowledge. He has no significant CAD s/p left heart catheterization December 2012. He reports worsening DOE and abdominal "tightness" over the last several months. He has been hospitalized 6 times in the last year with acute on chronic CHF. He reports on a "good day" he can walk up ~20 stair steps without stopping to catch his breath; however, now he states he would have to stop probably after 4-5 steps due to SOB. He reports intermittent PND. He has recurrent chest "pressure" as well. He has noticed racing palpitations intermittently but denies SOB, dizziness, near syncope or syncope in relation to his palpitations. He denies any history of syncope. He has history of medical noncompliance but reports compliance with his medications and low sodium diet currently. He has taken metoprolol in the past but does not know why this medication was stopped. He does not recognize the name Coreg, carvedilol. He is currently taking Bystolic and Bidil.   He has significant  problems with epigastric pain attributed to GERD   Past Medical History  Diagnosis Date  . Hypertension   . GERD (gastroesophageal reflux disease)   . Hiatal hernia   . CHF (congestive heart failure)   . Cardiomyopathy, nonischemic   . Atrial fibrillation, previously listed in DC summary from August 31, 2011, ? atrial tachycardia   . COPD, previously listed in DC summary from Feb 2012   . Chronic kidney disease, listed in chart although patient denies     Past Surgical History  Procedure Date  . Cardiac catheterization Dec 2012 - no significant CAD     Allergies/Intolerances No Known Allergies  Inpatient Medications . aspirin EC  81 mg Oral Daily  . clopidogrel  75 mg Oral Q breakfast  . diltiazem  60 mg Oral BID  . furosemide  80 mg Intravenous Once  . furosemide  40 mg Oral Daily  . heparin  1,750 Units Intravenous Once  . heparin  2,000 Units Intravenous Once  . heparin subcutaneous  5,000 Units Subcutaneous Q8H  . hydrocodone-acetaminophen  1 tablet Oral QHS  . isosorbide mononitrate  30 mg Oral Daily  . pantoprazole  40 mg Oral Daily  . sodium chloride  3 mL Intravenous Q12H   Family History Problem Relation Age of Onset  . Lung cancer Mother   . Pneumonia Father   . Stroke Sister   . Diabetes Sister   . Seizures Brother     Social History History   Social History  . Marital Status: Single   Social History Main Topics  . Smoking status: Former Smoker -- 0.2 packs/day for 15 years  Types: Cigarettes    Quit date: 08/18/2011  . Smokeless tobacco: Never Used  . Alcohol Use: No  . Drug Use: None currently; h/o cocaine abuse  . Sexually Active: Yes   Review of Systems General: No chills, fever, night sweats or weight changes  Cardiovascular: Positive for palpitations, DOE and abdominal distention. No dyspnea at rest. No LE edema  Dermatological: No rash, lesions or masses Respiratory: No cough Urologic: No hematuria, dysuria Abdominal: No nausea,  vomiting, diarrhea, bright red blood per rectum, melena, or hematemesis Neurologic: No visual changes, weakness, changes in mental status Musculoskeletal: Positive for back pain All other systems reviewed and are otherwise negative except as noted above.  Physical Exam Blood pressure 112/73, pulse 113, temperature 97.5 F (36.4 C), temperature source Oral, resp. rate 20, height 5\' 9"  (1.753 m), weight 138 lb 3.7 oz (62.7 kg), SpO2 99.00%.  Alert and oriented middle aged balck male in  Mod resp distress  RR 24 while I am taling  HENT- normal Eyes- EOMI, without scleral icterus Skin- warm and dry; without rashes LN-neg Neck- supple without thyromegaly, JVP >10, carotids brisk and full without bruits Back-without CVAT or kyphosis Lungs-bilateral crackles CV-Regular rate and rhythm, nl S1 and S2, harsh 3/6 sys Mat apex, displaced and dyskinetic PMI\ Abd-soft with active bowel sounds; no midline pulsation +HJR ]Pulses-intact femoral and distal MKS-without gross deformity Neuro- Ax O, CN3-12 intact, grossly normal motor and sensory function Affect engaging Edema 1+   Labs  Basename 09/08/11 1936 09/08/11 1355 09/08/11 0824  CKTOTAL 65 67 77  CKMB 3.5 3.8 4.2*  TROPONINI <0.30 <0.30 <0.30   Lab Results  Component Value Date   WBC 7.9 09/09/2011   HGB 12.7* 09/09/2011   HCT 37.9* 09/09/2011   MCV 99.0 09/09/2011   PLT 516* 09/09/2011    Lab 09/09/11 0252 09/08/11 0341  NA 138 --  K 4.0 --  CL 101 --  CO2 20 --  BUN 40* --  CREATININE 2.18* --  CALCIUM 9.4 --  PROT -- 6.9  BILITOT -- 1.9*  ALKPHOS -- 108  ALT -- 26  AST -- 37  GLUCOSE 134* --   No components found with this basename: MAGNESIUM No components found with this basename: POCBNP:3 No results found for this basename: DDIMER   Basename 09/09/11 0252  INR 1.24    Radiology/Studies Dg Abd 1 View 09/08/2011  *RADIOLOGY REPORT*  Clinical Data: Epigastric abdominal pain.  ABDOMEN - 1 VIEW  Comparison: CT scan  08/10/2011.  Findings: The right lung base is not well visualized.  I suspect there is a small right effusion and possible infiltrate or atelectasis.  A chest x-ray may be helpful for further evaluation.  Left renal calculi are noted.  The bowel gas pattern is unremarkable.  The bony structures are intact.  Stable degenerative changes involving the lumbar spine and partial lumbarization of S1.  IMPRESSION:  1.  Possible right lower lobe process.  Recommend a dedicated chest x-ray. 2.  No plain film findings for an acute abdominal process.  Left renal calculi are again demonstrated.  Original Report Authenticated By: P. Loralie Champagne, M.D.   Chest Portable 1 View 09/08/2011  *RADIOLOGY REPORT*  Clinical Data: Chest pain.  PORTABLE CHEST - 1 VIEW  Comparison: 08/23/2011.  Findings: The heart is enlarged but stable.  The mediastinal and hilar contours are stable.  No acute pulmonary findings.  Stable scattered calcified granulomas.  IMPRESSION: Cardiac enlargement but no acute pulmonary findings.  Original  Report Authenticated By: P. Loralie Champagne, M.D.   Echocardiogram 08/24/2011 - per report - Left ventricle: The cavity size was mildly dilated. There was mild concentric hypertrophy. Systolic function was severely reduced. The estimated ejection fraction was in the range of 25% to 30%. Diffuse hypokinesis. Doppler parameters are consistent with abnormal left ventricular relaxation (grade 1 diastolic dysfunction). - Mitral valve: Calcified annulus. Severe regurgitation. - Right ventricle: The cavity size was mildly dilated. Wall thickness was normal. Systolic function was moderately to severely reduced. - Right atrium: The atrium was moderately dilated. - Left atrium: Dilated to 50 mm. - Tricuspid valve: Severe regurgitation. - Pulmonary arteries: Systolic pressure was moderately to severely increased. PA peak pressure: 57mm Hg (S).  12-lead ECG sinus rhythm with PACs, LVH with QRS widening; QRS 120 ms;  anterolateral T wave inversions  Prior 12-lead ECGs show ectopic atrial rhythms (07/22/2011) and, more recently (08/24/2011), a regular wide complex tachycardia most consistent with an atrial tachycardia with underlying IVCD  Telemetry normal sinus rhythm with intermittent, probable atrial tachycardia  Assessment and Plan 1. NICM with biventricular failure, LVEF 25%, RVEF moderately reduced with severe MR, severe TR 2. Atrial tachycardia, paroxysmal 3. Chronic systolic CHF, NYHA class III-IV 4. History of medical noncompliance 5. Chronic kidney disease/Cardiorenal syndrome  Mr. Welton has a dilated cardiomyopathy, diagnosed in 1997, which has worsened now to biventricular failure, severe valvular insufficiencies and worsening systolic CHF. He has a history of noncompliance. He is currently taking Bystolic and Bidil. He cannot take ACEI/ARB or spironolactone due to renal dysfunction. He has been admitted several times for acute decompensated CHF in the last year. We recommend optimizing his heart failure regimen and would consider discontinuing  Bystolic in favor of carvedilol. We agree with continuing imdur/hydralazine therapy in setting of NICM.   He also has nearly incessant tachycardia,  It appears to be like PJRT with preexctation of retrograde atrial activation with PVC, initiation and termination with premature beats.  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

## 2011-09-09 NOTE — Progress Notes (Signed)
ANTICOAGULATION CONSULT NOTE - Follow Up Consult  Pharmacy Consult for heparin Indication: chest pain/ACS  No Known Allergies  Patient Measurements: Height: 5\' 9"  (175.3 cm) Weight: 137 lb 9.1 oz (62.4 kg) (Scale A) IBW/kg (Calculated) : 70.7  Heparin Dosing Weight: 62.4 kg  Vital Signs: Temp: 97.6 F (36.4 C) (07/24 0216) Temp src: Oral (07/24 0216) BP: 113/70 mmHg (07/24 0216) Pulse Rate: 60  (07/24 0216)  Labs:  Basename 09/09/11 0252 09/08/11 1936 09/08/11 1739 09/08/11 1355 09/08/11 1125 09/08/11 0824 09/08/11 0244  HGB 12.7* -- -- -- -- -- 12.3*  HCT 37.9* -- -- -- -- -- 37.5*  PLT 516* -- -- -- -- -- 480*  APTT -- -- -- -- 29 -- --  LABPROT 15.9* -- -- -- 15.9* -- --  INR 1.24 -- -- -- 1.24 -- --  HEPARINUNFRC <0.10* -- <0.10* -- -- -- --  CREATININE -- -- -- -- -- -- 2.03*  CKTOTAL -- 65 -- 67 -- 77 --  CKMB -- 3.5 -- 3.8 -- 4.2* --  TROPONINI -- <0.30 -- <0.30 -- <0.30 --    Estimated Creatinine Clearance: 33.7 ml/min (by C-G formula based on Cr of 2.03).   Medications:  Scheduled:     . aspirin  324 mg Oral NOW   Or  . aspirin  300 mg Rectal NOW  . aspirin EC  81 mg Oral Daily  . clopidogrel  75 mg Oral Q breakfast  . diltiazem  60 mg Oral BID  . furosemide  80 mg Intravenous Once  . furosemide  40 mg Oral Daily  . heparin  1,750 Units Intravenous Once  . heparin  3,500 Units Intravenous Once  . HYDROcodone-acetaminophen  1 tablet Oral QHS  . isosorbide mononitrate  30 mg Oral Daily  .  morphine injection  2 mg Intravenous Once  . pantoprazole  40 mg Oral Daily  . sodium chloride  3 mL Intravenous Q12H  . DISCONTD: furosemide  60 mg Intravenous Once  . DISCONTD: isosorbide-hydrALAZINE  0.5-1 tablet Oral TID   Infusions:     . heparin 950 Units/hr (09/08/11 1842)  . DISCONTD: heparin 750 Units/hr (09/08/11 1324)    Assessment: 61 yo male with CP/ACS is currently on IV heparin. Heparin level (<0.1) is below-goal on 950 units/hr. No problem  with line/infusion per RN.   Goal of Therapy:  Heparin level 0.3-0.7 units/ml Monitor platelets by anticoagulation protocol: Yes   Plan:  1. Heparin IV bolus of 2000 units x 1.  2. Increase IV heparin infusion to 1200 units/hr.  3. Heparin level in 8 hours.   Emeline Gins 09/09/2011,3:39 AM

## 2011-09-10 ENCOUNTER — Encounter (HOSPITAL_COMMUNITY): Payer: Self-pay | Admitting: Internal Medicine

## 2011-09-10 ENCOUNTER — Other Ambulatory Visit: Payer: Self-pay

## 2011-09-10 ENCOUNTER — Encounter (HOSPITAL_COMMUNITY)
Admission: AD | Disposition: A | Discharge status: Home / Self Care | Payer: Self-pay | Source: Home / Self Care | Attending: Cardiovascular Disease

## 2011-09-10 DIAGNOSIS — I1310 Hypertensive heart and chronic kidney disease without heart failure, with stage 1 through stage 4 chronic kidney disease, or unspecified chronic kidney disease: Secondary | ICD-10-CM

## 2011-09-10 DIAGNOSIS — I5023 Acute on chronic systolic (congestive) heart failure: Secondary | ICD-10-CM

## 2011-09-10 DIAGNOSIS — R079 Chest pain, unspecified: Secondary | ICD-10-CM

## 2011-09-10 DIAGNOSIS — R570 Cardiogenic shock: Secondary | ICD-10-CM

## 2011-09-10 DIAGNOSIS — G540 Brachial plexus disorders: Secondary | ICD-10-CM | POA: Diagnosis present

## 2011-09-10 DIAGNOSIS — G8320 Monoplegia of upper limb affecting unspecified side: Secondary | ICD-10-CM

## 2011-09-10 DIAGNOSIS — N189 Chronic kidney disease, unspecified: Secondary | ICD-10-CM

## 2011-09-10 DIAGNOSIS — I509 Heart failure, unspecified: Secondary | ICD-10-CM

## 2011-09-10 HISTORY — PX: RIGHT HEART CATHETERIZATION: SHX5447

## 2011-09-10 LAB — POCT I-STAT 3, VENOUS BLOOD GAS (G3P V)
Acid-base deficit: 5 mmol/L — ABNORMAL HIGH (ref 0.0–2.0)
Bicarbonate: 22.8 mEq/L (ref 20.0–24.0)
O2 Saturation: 28 %
O2 Saturation: 28 %
TCO2: 24 mmol/L (ref 0–100)
pCO2, Ven: 46 mmHg (ref 45.0–50.0)
pO2, Ven: 20 mmHg — CL (ref 30.0–45.0)
pO2, Ven: 20 mmHg — CL (ref 30.0–45.0)

## 2011-09-10 LAB — BASIC METABOLIC PANEL
BUN: 33 mg/dL — ABNORMAL HIGH (ref 6–23)
Chloride: 102 mEq/L (ref 96–112)
GFR calc Af Amer: 47 mL/min — ABNORMAL LOW (ref >90)
Glucose, Bld: 127 mg/dL — ABNORMAL HIGH (ref 70–99)
Potassium: 3.5 mEq/L (ref 3.5–5.1)
Sodium: 138 mEq/L (ref 135–145)

## 2011-09-10 LAB — CBC
HCT: 37.3 % — ABNORMAL LOW (ref 39.0–52.0)
HCT: 36.8 % — ABNORMAL LOW (ref 39.0–52.0)
Hemoglobin: 12.1 g/dL — ABNORMAL LOW (ref 13.0–17.0)
MCHC: 33.2 g/dL (ref 30.0–36.0)
MCV: 100 fL (ref 78.0–100.0)
RDW: 14 % (ref 11.5–15.5)
RDW: 14 % (ref 11.5–15.5)
WBC: 7 10*3/uL (ref 4.0–10.5)

## 2011-09-10 LAB — CARBOXYHEMOGLOBIN
O2 Saturation: 37.6 %
Total hemoglobin: 11.8 g/dL — ABNORMAL LOW (ref 13.5–18.0)

## 2011-09-10 LAB — MRSA PCR SCREENING: MRSA by PCR: NEGATIVE

## 2011-09-10 LAB — HEPARIN LEVEL (UNFRACTIONATED): Heparin Unfractionated: 0.38 IU/mL (ref 0.30–0.70)

## 2011-09-10 SURGERY — RIGHT HEART CATH
Anesthesia: LOCAL

## 2011-09-10 MED ORDER — ASPIRIN 81 MG PO CHEW: 324.0000 mg | CHEWABLE_TABLET | ORAL | Status: DC

## 2011-09-10 MED ORDER — HEPARIN (PORCINE) IN NACL 2-0.9 UNIT/ML-% IJ SOLN
INTRAMUSCULAR | Status: AC
  Filled 2011-09-10: qty 2000

## 2011-09-10 MED ORDER — AMIODARONE HCL IN DEXTROSE 360-4.14 MG/200ML-% IV SOLN
1.0000 mg/min | INTRAVENOUS | Status: AC
  Administered 2011-09-10 (×2): 1 mg/min via INTRAVENOUS
  Filled 2011-09-10 (×2): qty 200

## 2011-09-10 MED ORDER — HEPARIN SODIUM (PORCINE) 1000 UNIT/ML IJ SOLN
INTRAMUSCULAR | Status: AC
  Filled 2011-09-10: qty 1

## 2011-09-10 MED ORDER — AMIODARONE HCL IN DEXTROSE 360-4.14 MG/200ML-% IV SOLN
0.5000 mg/min | INTRAVENOUS | Status: DC
  Administered 2011-09-11 – 2011-09-12 (×4): 0.5 mg/min via INTRAVENOUS
  Filled 2011-09-10 (×11): qty 200

## 2011-09-10 MED ORDER — FENTANYL CITRATE 0.05 MG/ML IJ SOLN
INTRAMUSCULAR | Status: AC
  Filled 2011-09-10: qty 2

## 2011-09-10 MED ORDER — SODIUM CHLORIDE 0.9 % IV SOLN: 250.0000 mL | INTRAVENOUS | Status: DC | PRN

## 2011-09-10 MED ORDER — SODIUM CHLORIDE 0.9 % IJ SOLN: 3.0000 mL | INTRAMUSCULAR | Status: DC | PRN

## 2011-09-10 MED ORDER — CARVEDILOL 3.125 MG PO TABS
3.1250 mg | ORAL_TABLET | Freq: Two times a day (BID) | ORAL | Status: DC
  Filled 2011-09-10 (×2): qty 1

## 2011-09-10 MED ORDER — MIDAZOLAM HCL 2 MG/2ML IJ SOLN
INTRAMUSCULAR | Status: AC
  Filled 2011-09-10: qty 2

## 2011-09-10 MED ORDER — CARVEDILOL 3.125 MG PO TABS
3.1250 mg | ORAL_TABLET | Freq: Two times a day (BID) | ORAL | Status: DC
  Administered 2011-09-10: 3.125 mg via ORAL
  Filled 2011-09-10 (×3): qty 1

## 2011-09-10 MED ORDER — FENTANYL BOLUS VIA INFUSION
25.0000 ug | INTRAVENOUS | Status: DC | PRN
  Administered 2011-09-10: 50 ug via INTRAVENOUS
  Filled 2011-09-10: qty 50

## 2011-09-10 MED ORDER — SODIUM CHLORIDE 0.9 % IJ SOLN: 3.0000 mL | Freq: Two times a day (BID) | INTRAMUSCULAR | Status: DC

## 2011-09-10 MED ORDER — FENTANYL CITRATE 0.05 MG/ML IJ SOLN
50.0000 ug | INTRAMUSCULAR | Status: DC | PRN
  Administered 2011-09-10 – 2011-09-11 (×2): 50 ug via INTRAVENOUS
  Filled 2011-09-10 (×2): qty 2

## 2011-09-10 MED ORDER — ACETAMINOPHEN 325 MG PO TABS: 650.0000 mg | ORAL_TABLET | ORAL | Status: DC | PRN

## 2011-09-10 MED ORDER — LIDOCAINE HCL (PF) 1 % IJ SOLN
INTRAMUSCULAR | Status: AC
  Filled 2011-09-10: qty 30

## 2011-09-10 MED ORDER — NITROGLYCERIN 0.2 MG/ML ON CALL CATH LAB
INTRAVENOUS | Status: AC
  Filled 2011-09-10: qty 1

## 2011-09-10 MED ORDER — MILRINONE IN DEXTROSE 200-5 MCG/ML-% IV SOLN
0.1250 ug/kg/min | INTRAVENOUS | Status: DC
  Administered 2011-09-10: 0.25 ug/kg/min via INTRAVENOUS
  Administered 2011-09-11 – 2011-09-15 (×8): 0.375 ug/kg/min via INTRAVENOUS
  Administered 2011-09-16: 0.25 ug/kg/min via INTRAVENOUS
  Filled 2011-09-10 (×11): qty 100

## 2011-09-10 MED ORDER — HEPARIN (PORCINE) IN NACL 100-0.45 UNIT/ML-% IJ SOLN
700.0000 [IU]/h | INTRAMUSCULAR | Status: DC
  Filled 2011-09-10 (×2): qty 250

## 2011-09-10 MED ORDER — AMIODARONE HCL 200 MG PO TABS
200.0000 mg | ORAL_TABLET | Freq: Two times a day (BID) | ORAL | Status: DC
  Administered 2011-09-10: 200 mg via ORAL
  Filled 2011-09-10 (×2): qty 1

## 2011-09-10 MED ORDER — DIGOXIN 0.0625 MG HALF TABLET: 0.0625 mg | ORAL_TABLET | Freq: Every day | ORAL | Status: DC

## 2011-09-10 MED ORDER — ONDANSETRON HCL 4 MG/2ML IJ SOLN: 4.0000 mg | Freq: Four times a day (QID) | INTRAMUSCULAR | Status: DC | PRN

## 2011-09-10 MED ORDER — FUROSEMIDE 10 MG/ML IJ SOLN
80.0000 mg | Freq: Two times a day (BID) | INTRAMUSCULAR | Status: DC
  Administered 2011-09-11: 80 mg via INTRAVENOUS
  Filled 2011-09-10 (×4): qty 8

## 2011-09-10 MED ORDER — AMIODARONE LOAD VIA INFUSION
150.0000 mg | Freq: Once | INTRAVENOUS | Status: AC
  Administered 2011-09-10: 150 mg via INTRAVENOUS
  Filled 2011-09-10: qty 83.34

## 2011-09-10 MED ORDER — FENTANYL CITRATE 0.05 MG/ML IJ SOLN
INTRAMUSCULAR | Status: AC
  Administered 2011-09-10: 50 ug
  Filled 2011-09-10: qty 2

## 2011-09-10 NOTE — Progress Notes (Signed)
Patient: Rey Dansby Lessig Date of Encounter: 09/10/2011, 9:06 AM Admit date: 09/08/2011     Subjective  Mr. Sermersheim denies any new complaints this AM. He reports persistent SOB.   Objective  Physical Exam: Vitals: BP 118/99  Pulse 116  Temp 97.3 F (36.3 C) (Oral)  Resp 20  Ht 5\' 9"  (1.753 m)  Wt 140 lb 4.8 oz (63.64 kg)  BMI 20.72 kg/m2  SpO2 97% General: Well developed 61 year old male in no acute distress. Neck: Supple. +JVD. Lungs: Clear bilaterally to auscultation without wheezes, rales, or rhonchi. Breathing is unlabored. Heart: Regular S1 S2 with III/VI systolic murmur. No diastolic murmur, rub or gallop.  Abdomen: Soft, non-distended. Extremities: No clubbing or cyanosis. No edema.  Distal pedal pulses are 2+ and equal bilaterally. Neuro: Alert and oriented X 3. Moves all extremities spontaneously. No focal deficits.  Intake/Output: Intake/Output Summary (Last 24 hours) at 09/10/11 0906 Last data filed at 09/10/11 1191  Gross per 24 hour  Intake    823 ml  Output   1250 ml  Net   -427 ml   Inpatient Medications:  . amiodarone  200 mg Oral BID  . aspirin  324 mg Oral NOW  . aspirin EC  81 mg Oral Daily  . carvedilol  3.125 mg Oral BID WC  . clopidogrel  75 mg Oral Q breakfast  . furosemide  40 mg Oral Daily  . heparin subcutaneous  5,000 Units Subcutaneous Q8H  . hydrocodone-acetaminophen  1 tablet Oral QHS  . isosorbide mononitrate  30 mg Oral Daily  . pantoprazole  40 mg Oral Daily   Labs:  [facility] 09/09/11 0252 09/08/11 0244  NA 138 139  K 4.0 5.1  CL 101 105  CO2 20 21  GLUCOSE 134* 107*  BUN 40* 39*  CREATININE 2.18* 2.03*  CALCIUM 9.4 9.4  MG -- --  PHOS -- --    Basename 09/08/11 0341  AST 37  ALT 26  ALKPHOS 108  BILITOT 1.9*  PROT 6.9  ALBUMIN 3.0*    Basename 09/08/11 0341  LIPASE 36  AMYLASE --    Basename 09/10/11 0511 09/09/11 0252  WBC 7.0 7.9  NEUTROABS -- --  HGB 12.1* 12.7*  HCT 37.3* 37.9*  MCV 100.5* 99.0    PLT 476* 516*    Basename 09/08/11 1936 09/08/11 1355 09/08/11 0824  CKTOTAL 65 67 77  CKMB 3.5 3.8 4.2*  TROPONINI <0.30 <0.30 <0.30    Radiology/Studies:  Echocardiogram 08/24/2011 - per report  - Left ventricle: The cavity size was mildly dilated. There was mild concentric hypertrophy. Systolic function was severely reduced. The estimated ejection fraction was in the range of 25% to 30%. Diffuse hypokinesis. Doppler parameters are consistent with abnormal left ventricular relaxation (grade 1 diastolic dysfunction). - Mitral valve: Calcified annulus. Severe regurgitation. - Right ventricle: The cavity size was mildly dilated. Wall thickness was normal. Systolic function was moderately to severely reduced. - Right atrium: The atrium was moderately dilated.  - Left atrium: Dilated to 50 mm. - Tricuspid valve: Severe regurgitation. - Pulmonary arteries: Systolic pressure was moderately to severely increased. PA peak pressure: 57mm Hg (S).  Telemetry: probable junctional tachycardia and inermittent sinus bradycardia   Assessment and Plan  1. Incessant tachycardia - ? junctional tachycardia; will start low dose amiodarone and watch rate closely; once more stable from CHF standpoint, would recommend EP study for definitive evaluation of his arrhythmia 2. Acute on chronic systolic CHF with biventricular  failure - agree with CHF consultation given multiple CHF admissions; hopefully controlling his tachycardia will help with his CHF symptoms  Dr. Graciela Husbands to make further EP recommendations. Signed, Debroah Shuttleworth PA-C

## 2011-09-10 NOTE — CV Procedure (Signed)
Cardiac Cath Procedure Note:  Indication:  HF  Procedures performed:  1) Right heart catheterization 2) IABP insertion  Description of procedure:   The risks and indication of the procedure were explained. Consent was signed and placed on the chart. An appropriate timeout was taken prior to the procedure. The right neck was prepped and draped in the routine sterile fashion and anesthetized with 1% local lidocaine. We had significantly difficulty locating his RIJ. We then used u/s guidance to locate the vein.   A 7 FR venous sheath was placed in the right internal jugular vein using a modified Seldinger technique. A standard Swan-Ganz catheter was used for the procedure.   After the Ernestine Conrad was placed patient complained of blurry vision and weakness of his R arm. I suspected that I numbed his R brachial plexus with lidocaine. However we asked Neurology to evaluate and confirm. He was seen by Dr. Roseanne Reno of the Neurology service who agree that it was not a CVA and was due to numbing of his brachial plexus.   RHC revealed severe cardiogenic shock. The R groin was then prepped and an IABP was inserted through the RFA under fluoroscopic guidance.  Complications: As described above.   Findings:  RA = 17 RV = 87/14/18 PA =  87/56 (69) PCW =  31 Fick cardiac output/index = 2.1/1.2 PVR = 12.6 Woods FA sat = 98% PA sat = 28%, 28%  Assessment: 1. Cardiogenic shock with elevated filling pressures 2. Severe pulmonary HTN with both R and L sided components 3. Junctional/retrograde tachycardia  Plan/Discussion:  Will admit to ICU. Support with IABP and milrinone. Diurese. Will start IV amio to control rhythm. Hopefully hemodynamics will improve with control of tachycardia. Not VAD or transplant candidate at this point due to RV failure and severe PAH. Hopefully R sided pressures will improve with IABP and milrinone.   Daniel Bensimhon 6:12 PM

## 2011-09-10 NOTE — Progress Notes (Signed)
ANTICOAGULATION CONSULT NOTE - Follow UP  Pharmacy Consult for Heparin Indication:  IABP  No Known Allergies  Patient Measurements: Height: 5\' 9"  (175.3 cm) Weight: 138 lb 14.2 oz (63 kg) IBW/kg (Calculated) : 70.7  Heparin Dosing Weight: 63  Vital Signs: Temp: 98 F (36.7 C) (07/25 1900) Temp src: Oral (07/25 1900) BP: 138/65 mmHg (07/25 2100) Pulse Rate: 89  (07/25 2000)  Labs:  Alvira Philips 09/10/11 2015 09/10/11 4098 09/10/11 0511 09/09/11 0252 09/08/11 1936 09/08/11 1739 09/08/11 1355 09/08/11 1125 09/08/11 0824 09/08/11 0244  HGB 12.2* -- 12.1* -- -- -- -- -- -- --  HCT 36.8* -- 37.3* 37.9* -- -- -- -- -- --  PLT 505* -- 476* 516* -- -- -- -- -- --  APTT -- -- -- -- -- -- -- 29 -- --  LABPROT -- -- -- 15.9* -- -- -- 15.9* -- --  INR -- -- -- 1.24 -- -- -- 1.24 -- --  HEPARINUNFRC 0.38 -- -- <0.10* -- <0.10* -- -- -- --  CREATININE -- 1.75* -- 2.18* -- -- -- -- -- 2.03*  CKTOTAL -- -- -- -- 65 -- 67 -- 77 --  CKMB -- -- -- -- 3.5 -- 3.8 -- 4.2* --  TROPONINI -- -- -- -- <0.30 -- <0.30 -- <0.30 --   Estimated Creatinine Clearance: 39.5 ml/min (by C-G formula based on Cr of 1.75).  Medical History: Past Medical History  Diagnosis Date  . Hypertension   . GERD (gastroesophageal reflux disease)   . Hiatal hernia   . CHF (congestive heart failure)   . Cardiomyopathy     nonischemic   . Shortness of breath   . Pneumonia     hx of pna  . Atrial fibrillation      previously listed in DC summary from August 31, 2011, ? atrial tachycardia   . COPD (chronic obstructive pulmonary disease)     previously listed in DC summary from Feb 2012   . Chronic kidney disease (CKD), stage II (mild)     listed in chart although patient denies    Medications:  Prescriptions prior to admission  Medication Sig Dispense Refill  . albuterol (PROVENTIL HFA;VENTOLIN HFA) 108 (90 BASE) MCG/ACT inhaler Inhale 2 puffs into the lungs every 6 (six) hours as needed. For shortness of breath        . aspirin EC 81 MG EC tablet Take 1 tablet (81 mg total) by mouth daily.      Marland Kitchen diltiazem (CARDIZEM) 30 MG tablet Take 2 tablets (60 mg total) by mouth 2 (two) times daily.  60 tablet  1  . esomeprazole (NEXIUM) 40 MG capsule Take 40 mg by mouth daily before breakfast.      . furosemide (LASIX) 40 MG tablet Take 40 mg by mouth daily.        Marland Kitchen HYDROcodone-acetaminophen (NORCO) 5-325 MG per tablet Take 1 tablet by mouth at bedtime. For pain      . isosorbide-hydrALAZINE (BIDIL) 20-37.5 MG per tablet Take 0.5-1 tablets by mouth 3 (three) times daily.       . Multiple Vitamins-Minerals (MULTIVITAMINS THER. W/MINERALS) TABS Take 0.5 tablets by mouth 2 (two) times daily.       . nebivolol (BYSTOLIC) 10 MG tablet Take 5 mg by mouth 2 (two) times daily.       . potassium chloride SA (K-DUR,KLOR-CON) 20 MEQ tablet Take 1 tablet (20 mEq total) by mouth 2 (two) times daily.  60 tablet  1  Assessment: 61 yo male admitted with shortness of breath and leg edema.  He has a history of cardiomyopathy and was seen in the cath lab today for further work up.  He now has an IABP and we have been asked to initiate Heparin therapy for him.  His past medical history does not reveal any history of bleeding complications.  His labs show some slight anemia with H/H of 12.1/37.3.  His platelets are 476K.  He received one dose of SQ heparin for DVT prophylaxis this morning.  We will utilize his TBW for dosing and begin therapy at ~ 10 units/kg with a f/u heparin level in 8 hours.  Spoke with nurse around 9PM, patient having some oozing from catheter insertion site - dressing saturated under tegaderm.  Heparin level ordered for now - returns at 0.38 IU/ml which is ~ 3.5 hours from the time it was started.  Heparin was infusing at 84ml/hr during this time frame.  He is not yet at a s/s level so result is not truly reflective of his response.  Will ask nurse to reduce rate to 600 units/hr and repeat heparin level at ~  02:00AM.  Goal of Therapy:  Heparin level 0.3-0.5 IU/ml Monitor platelets by anticoagulation protocol: Yes   Plan:   Continue IV heparin at 600 units/hr  Obtain a heparin level 8 hours after starting therapy  Daily CBC and heparin therapy  Nadara Mustard, PharmD., MS Clinical Pharmacist Pager:  [phone]  Thank you for allowing pharmacy to be part of this patients care team. 09/10/2011,9:06 PM

## 2011-09-10 NOTE — Consult Note (Signed)
[facility] CONSULT NOTE     Reason for Consult: Numbness and weakness of right upper extremity.    HPI:     [name] is an 61 y.o. male history of severe cardiomyopathy with congestive heart failure and valvular disease as well as pulmonary hypertension, who developed weakness and numbness involving his right upper extremity shortly after being prepped for balloon, placement with local anesthesia. He developed no speech difficulty and no facial weakness/numbness. He also had no right lower extremity weakness nor numbness. He had mild diffuse blurred vision bilaterally but no other complaints. Neurology consultation was obtained to rule out possible acute stroke, although it was felt that his right upper extremity numbness and weakness was likely secondary to effects of local anesthesia a right brachial plexus.   Past Medical History  Diagnosis Date  . Hypertension   . GERD (gastroesophageal reflux disease)   . Hiatal hernia   . CHF (congestive heart failure)   . Cardiomyopathy     nonischemic   . Shortness of breath   . Pneumonia     hx of pna  . Atrial fibrillation      previously listed in DC summary from August 31, 2011, ? atrial tachycardia   . COPD (chronic obstructive pulmonary disease)     previously listed in DC summary from Feb 2012   . Chronic kidney disease (CKD), stage II (mild)     listed in chart although patient denies     Past Surgical History  Procedure Date  . Cardiac catheterization     Family History  Problem Relation Age of Onset  . Lung cancer Mother     passed away from lung cancer  . Pneumonia Father     passed sway from PNA.  . Stroke Sister   . Diabetes Sister   . Seizures Brother   . Seizures Brother     Social History:  reports that he quit smoking about 3 weeks ago. His smoking use included Cigarettes. He has a 3.75 pack-year smoking history. He has never used smokeless tobacco. He reports that he  drinks alcohol. He reports that he uses illicit drugs.  No Known Allergies  Medications:    Scheduled:   . amiodarone  200 mg Oral BID  . aspirin  324 mg Oral Pre-Cath  . aspirin EC  81 mg Oral Daily  . carvedilol  3.125 mg Oral BID WC  . clopidogrel  75 mg Oral Q breakfast  . fentaNYL      . furosemide  40 mg Oral Daily  . heparin      . heparin      . HYDROcodone-acetaminophen  1 tablet Oral QHS  . isosorbide mononitrate  30 mg Oral Daily  . lidocaine      . midazolam      . pantoprazole  40 mg Oral Daily  . sodium chloride  3 mL Intravenous Q12H  . DISCONTD: carvedilol  3.125 mg Oral BID WC  . DISCONTD: digoxin  0.0625 mg Oral Daily  . DISCONTD: diltiazem  60 mg Oral BID  . DISCONTD: heparin subcutaneous  5,000 Units Subcutaneous Q8H  . DISCONTD: sodium chloride  3 mL Intravenous Q12H   Continuous:   . heparin     ZOX:WRUEAV chloride, sodium chloride, acetaminophen, albuterol, calcium carbonate, HYDROcodone-acetaminophen, nitroGLYCERIN, ondansetron (ZOFRAN) IV, sodium chloride, sodium chloride  Blood  pressure 123/94, pulse 109, temperature 97.3 F (36.3 C), temperature source Oral, resp. rate 16, height 5\' 9"  (1.753 m), weight 63.64 kg (140 lb 4.8 oz), SpO2 100.00%.   Neurologic Examination:  Neurological examination is limited because the patient was in a sterile environment and prepped for cardiac catheterization procedure. Patient was slightly drowsy but oriented well. There was no sign of expressive more receptive aphasia. Speech was fluent. Extraocular movements were full and conjugate. Visual fields were intact and normal. Was no facial numbness and no facial weakness. Hearing and speech were normal.  Moderate weakness proximally and distally of his right upper extremity as well as numbness diffusely involving his right upper extremity. Strength of his right lower extremity was normal.   Lab Results  Component Value Date/Time   CHOL 140 09/09/2011  2:52 AM      Results for orders placed during the hospital encounter of 09/08/11 (from the past 48 hour(s))  CARDIAC PANEL(CRET KIN+CKTOT+MB+TROPI)     Status: Normal   Collection Time   09/08/11  7:36 PM      Component Value Range Comment   Total CK 65  7 - 232 U/L    CK, MB 3.5  0.3 - 4.0 ng/mL    Troponin I <0.30  <0.30 ng/mL    Relative Index RELATIVE INDEX IS INVALID  0.0 - 2.5   HEPARIN LEVEL (UNFRACTIONATED)     Status: Abnormal   Collection Time   09/09/11  2:52 AM      Component Value Range Comment   Heparin Unfractionated <0.10 (*) 0.30 - 0.70 IU/mL   BASIC METABOLIC PANEL     Status: Abnormal   Collection Time   09/09/11  2:52 AM      Component Value Range Comment   Sodium 138  135 - 145 mEq/L    Potassium 4.0  3.5 - 5.1 mEq/L    Chloride 101  96 - 112 mEq/L    CO2 20  19 - 32 mEq/L    Glucose, Bld 134 (*) 70 - 99 mg/dL    BUN 40 (*) 6 - 23 mg/dL    Creatinine, Ser 4.54 (*) 0.50 - 1.35 mg/dL    Calcium 9.4  8.4 - 09.8 mg/dL    GFR calc non Af Amer 31 (*) >90 mL/min    GFR calc Af Amer 36 (*) >90 mL/min   CBC     Status: Abnormal   Collection Time   09/09/11  2:52 AM      Component Value Range Comment   WBC 7.9  4.0 - 10.5 K/uL    RBC 3.83 (*) 4.22 - 5.81 MIL/uL    Hemoglobin 12.7 (*) 13.0 - 17.0 g/dL    HCT 11.9 (*) 14.7 - 52.0 %    MCV 99.0  78.0 - 100.0 fL    MCH 33.2  26.0 - 34.0 pg    MCHC 33.5  30.0 - 36.0 g/dL    RDW 82.9  56.2 - 13.0 %    Platelets 516 (*) 150 - 400 K/uL   LIPID PANEL     Status: Normal   Collection Time   09/09/11  2:52 AM      Component Value Range Comment   Cholesterol 140  0 - 200 mg/dL    Triglycerides 67  <865 mg/dL    HDL 41  >78 mg/dL    Total CHOL/HDL Ratio 3.4      VLDL 13  0 - 40 mg/dL  LDL Cholesterol 86  0 - 99 mg/dL   PROTIME-INR     Status: Abnormal   Collection Time   09/09/11  2:52 AM      Component Value Range Comment   Prothrombin Time 15.9 (*) 11.6 - 15.2 seconds    INR 1.24  0.00 - 1.49   CBC     Status: Abnormal    Collection Time   09/10/11  5:11 AM      Component Value Range Comment   WBC 7.0  4.0 - 10.5 K/uL    RBC 3.71 (*) 4.22 - 5.81 MIL/uL    Hemoglobin 12.1 (*) 13.0 - 17.0 g/dL    HCT 11.9 (*) 14.7 - 52.0 %    MCV 100.5 (*) 78.0 - 100.0 fL    MCH 32.6  26.0 - 34.0 pg    MCHC 32.4  30.0 - 36.0 g/dL    RDW 82.9  56.2 - 13.0 %    Platelets 476 (*) 150 - 400 K/uL   BASIC METABOLIC PANEL     Status: Abnormal   Collection Time   09/10/11  8:38 AM      Component Value Range Comment   Sodium 138  135 - 145 mEq/L    Potassium 3.5  3.5 - 5.1 mEq/L    Chloride 102  96 - 112 mEq/L    CO2 22  19 - 32 mEq/L    Glucose, Bld 127 (*) 70 - 99 mg/dL    BUN 33 (*) 6 - 23 mg/dL    Creatinine, Ser 8.65 (*) 0.50 - 1.35 mg/dL    Calcium 9.2  8.4 - 78.4 mg/dL    GFR calc non Af Amer 40 (*) >90 mL/min    GFR calc Af Amer 47 (*) >90 mL/min     No results found.   Assessment/Plan:  Weakness and numbness involving right upper extremity consistent with brachial plexopathy. Deficits are likely secondary to effects of local anesthesia and transient. There no clinical signs of central nervous system deficit, including no receptive or expressive aphasia, no facial weakness no facial numbness, no dysarthria, and no right lower extremity weakness.  Recommendations: No neurodiagnostic intervention is indicated at this point. We will plan to see [name] in followup to ascertain that his deficits are indeed transient with expectation that his strength and sensation likely be back to normal within 24 hours.  Venetia Maxon M.D. [facility] [phone]  09/10/2011, 5:42 PM

## 2011-09-10 NOTE — H&P (View-Only) (Signed)
Subjective:  No chest pain but shortness of breath.  Objective:  Vital Signs in the last 24 hours: Temp:  [97.3 F (36.3 C)-97.5 F (36.4 C)] 97.3 F (36.3 C) (07/25 0647) Pulse Rate:  [56-116] 116  (07/25 0647) Cardiac Rhythm:  [-] Normal sinus rhythm (07/24 2007) Resp:  [20] 20  (07/25 0647) BP: (93-124)/(69-99) 118/99 mmHg (07/25 0647) SpO2:  [97 %-99 %] 97 % (07/25 0647) Weight:  [63.64 kg (140 lb 4.8 oz)] 63.64 kg (140 lb 4.8 oz) (07/25 0647)  Physical Exam: BP Readings from Last 1 Encounters:  09/10/11 118/99    Wt Readings from Last 1 Encounters:  09/10/11 63.64 kg (140 lb 4.8 oz)    Weight change: 1.24 kg (2 lb 11.7 oz)  HEENT: /AT, Eyes-Brown, PERL, EOMI, Conjunctiva-Pink, Sclera-Non-icteric Neck: + JVD, No bruit, Trachea midline. Lungs:  Clear, Bilateral. Cardiac:  Regular rhythm, normal S1 and S2, no S3. II/VI systolic murmur Abdomen:  Soft, non-tender. Extremities:  No edema present. No cyanosis. No clubbing. CNS: AxOx3, Cranial nerves grossly intact, moves all 4 extremities. Right handed. Skin: Warm and dry.   Intake/Output from previous day: 07/24 0701 - 07/25 0700 In: 823 [P.O.:820; I.V.:3] Out: 1550 [Urine:1550]    Lab Results: BMET    Component Value Date/Time   NA 138 09/09/2011 0252   K 4.0 09/09/2011 0252   CL 101 09/09/2011 0252   CO2 20 09/09/2011 0252   GLUCOSE 134* 09/09/2011 0252   BUN 40* 09/09/2011 0252   CREATININE 2.18* 09/09/2011 0252   CALCIUM 9.4 09/09/2011 0252   GFRNONAA 31* 09/09/2011 0252   GFRAA 36* 09/09/2011 0252   CBC    Component Value Date/Time   WBC 7.0 09/10/2011 0511   RBC 3.71* 09/10/2011 0511   HGB 12.1* 09/10/2011 0511   HCT 37.3* 09/10/2011 0511   PLT 476* 09/10/2011 0511   MCV 100.5* 09/10/2011 0511   MCH 32.6 09/10/2011 0511   MCHC 32.4 09/10/2011 0511   RDW 14.0 09/10/2011 0511   LYMPHSABS 1.2 11/10/2010 1019   MONOABS 0.7 11/10/2010 1019   EOSABS 0.3 11/10/2010 1019   BASOSABS 0.1 11/10/2010 1019   CARDIAC  ENZYMES Lab Results  Component Value Date   CKTOTAL 65 09/08/2011   CKMB 3.5 09/08/2011   TROPONINI <0.30 09/08/2011    Assessment/Plan:  Patient Active Hospital Problem List: Chest pain  MI ruled out with normal cardiac enzymes.  Chronic left heart systolic failure, Stable  Dilated cardiomyopathy,  Appreciate EP consult for ICD or Bi-V ICD  CKD, II  Chronic gastritis  Awaiting second opinion by Dr. Bensimohn for chronic congestive heart failure with valvular heart disease and pulmonary hypertension.. Check BMET Increase activity   LOS: 2 days    Quiera Diffee  MD  09/10/2011, 7:44 AM      

## 2011-09-10 NOTE — Consult Note (Signed)
Advanced Heart Failure Team Consult Note  Referring Physician: Dr. Sherryl Manges Primary Physician: Dr. [name] Primary Cardiologist:  Dr. [name]  Weight range: 140-144 lbs. 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 He has ruled out MI with normal cardiac enzymes. Last cardiac cath in 01/2011 showed normal coronary arteries.  He also reported DOE, orthopnea and PND.  He was recently hospitalized in June and July this year for acute decompensated CHF ( a total 6 admission since Feb, 2012). We have been asked to consult on his Biventricular heart failure.   [name] reports being diagnosed with a dilated cardiomyopathy in 1997 at which time he was also using cocaine; however, he tells me there was no cause identified to his knowledge. He reports worsening DOE and abdominal "tightness" over the last several months.  He reports intermittent orthopnea/ PND and get tired and fatigue easily. He has recurrent chest "pressure" as well. He reports on a "good day" he can walk up ~20 stair steps without stopping to catch his breath; however, now he states he would have to stop probably after 4-5 steps due to SOB. He used to able to cut grass in the beginning of this year and he is no longer able to due to DOE. He has noticed racing palpitations intermittently but denies SOB, dizziness, near syncope or syncope in relation to his palpitations. He has history of medical noncompliance but reports compliance with his medications and low sodium diet  currently. He is currently taking Lasix, Bystolic and Bidil.   2 D echo in 01/2011: LVEF 35-40% diffuse hypokinesis, severe Russell, moderate TR, RV mildly reduced. Normal PA 2 D echo in 07/2011: LVEF 30-35% diffuse hypokinesis, severe Russell, moderate TR, RV moderately reduced. PA mildly increased at 39 2 D echo in 08/2011: LVEF 25-30% diffuse hypokinesis, severe Russell, severe TR, RV moderately to severely reduced. PA 57  Review of Systems: [y] = yes, [ ]  = no   General: Weight gain [ ] ; Weight loss [ ] ; Anorexia [ ] ; Fatigue Cove.Etienne ]; Fever [ ] ; Chills [ ] ; Weakness [ y]  Cardiac: Chest pain/pressure Cove.Etienne ]; Resting SOB [ y]; Exertional SOB Cove.Etienne ]; Orthopnea [ y]; Pedal Edema [ ] ; Palpitations Cove.Etienne ]; Syncope [ ] ; Presyncope [ ] ; Paroxysmal nocturnal dyspnea[y ]  Pulmonary: Cough [ ] ; Wheezing[ ] ; Hemoptysis[ ] ; Sputum [ ] ; Snoring [ ]   GI: Vomiting[ ] ; Dysphagia[ ] ; Melena[ ] ; Hematochezia [ ] ; Heartburn[y ]; Abdominal pain [ ] ; Constipation [ ] ; Diarrhea [ ] ; BRBPR [ ]   GU: Hematuria[ ] ; Dysuria [ ] ; Nocturia[ ]   Vascular: Pain in legs with walking [ ] ; Pain in feet with lying flat [ ] ; Non-healing sores [ ] ; Stroke [ ] ; TIA [ ] ; Slurred speech [ ] ;  Neuro: Headaches[ ] ; Vertigo[ ] ; Seizures[ ] ; Paresthesias[ ] ;Blurred vision [ ] ; Diplopia [ ] ; Vision changes [ ]   Ortho/Skin: Arthritis [ ] ; Joint pain [ ] ; Muscle pain [ ] ; Joint swelling [ ] ; Back Pain [ ] ; Rash [ ]   Psych: Depression[ ] ;  Anxiety[ ]   Heme: Bleeding problems [ ] ; Clotting disorders [ ] ; Anemia [ ]   Endocrine: Diabetes [ ] ; Thyroid dysfunction[ ]   Home Medications Prior to Admission medications   Medication Sig Start Date End Date Taking? Authorizing Provider  albuterol (PROVENTIL HFA;VENTOLIN HFA) 108 (90 BASE) MCG/ACT inhaler Inhale 2 puffs into the lungs every 6 (six) hours as needed. For shortness of breath   Yes Historical Provider, MD  aspirin EC 81 MG EC tablet Take 1 tablet (81 mg total) by mouth daily. 08/31/11 08/30/12 Yes Ricki Rodriguez, MD  diltiazem (CARDIZEM) 30 MG tablet Take 2 tablets (60 mg total) by mouth 2 (two) times daily. 08/31/11 08/30/12 Yes Ricki Rodriguez, MD  esomeprazole (NEXIUM) 40 MG capsule Take 40 mg by mouth daily before breakfast.   Yes Historical Provider, MD  furosemide (LASIX) 40 MG tablet Take 40 mg by mouth daily.     Yes Historical Provider, MD  HYDROcodone-acetaminophen (NORCO) 5-325 MG per tablet Take 1 tablet by mouth at bedtime. For pain   Yes Historical Provider, MD  isosorbide-hydrALAZINE (BIDIL) 20-37.5 MG per tablet Take 0.5-1 tablets by mouth 3 (three) times daily.    Yes Historical Provider, MD  Multiple Vitamins-Minerals (MULTIVITAMINS THER. W/MINERALS) TABS Take 0.5 tablets by mouth 2 (two) times daily.    Yes Historical Provider, MD  nebivolol (BYSTOLIC) 10 MG tablet Take 5 mg by mouth 2 (two) times daily.    Yes Historical Provider, MD  potassium chloride SA (K-DUR,KLOR-CON) 20 MEQ tablet Take 1 tablet (20 mEq total) by mouth 2 (two) times daily. 08/31/11 08/30/12 Yes Ricki Rodriguez, MD   Past Medical History  Diagnosis Date  . Hypertension   . GERD (gastroesophageal reflux disease)   . Hiatal hernia   . CHF (congestive heart failure)   . Cardiomyopathy     nonischemic   . Shortness of breath   . Pneumonia     hx of pna  . Atrial fibrillation      previously listed in DC summary from August 31, 2011, ? atrial tachycardia   . COPD (chronic obstructive pulmonary disease)     previously listed in DC summary from Feb 2012   . Chronic kidney disease (CKD), stage II (mild)     listed in chart although patient denies      Past Surgical History: Past Surgical History  Procedure Date  . Cardiac catheterization     Family History: Family History  Problem Relation Age of Onset  . Lung cancer Mother   . Pneumonia Father   . Stroke Sister   . Diabetes Sister   . Seizures Brother   . Seizures Brother     Social History: History   Social History  . Marital Status:  Single    Spouse Name: N/A    Number of Children: N/A  . Years of Education: N/A   Social History Main Topics  . Smoking status: Former Smoker -- 0.2 packs/day for 15 years    Types: Cigarettes    Quit date: 08/18/2011  . Smokeless tobacco: Never Used  . Alcohol Use: Yes     social drinking 1-2 beers /month  . Drug Use: Yes     Cocaine and Marijuana in 1990's and stopped all drug abuse in 2003  . Sexually Active: Yes   Other Topics Concern  . None   Social History Narrative  . None    Allergies:  No Known Allergies  Objective:    Vital Signs:  Temp:  [97.3 F (36.3 C)-97.5 F (36.4 C)] 97.3 F (36.3 C) (07/25 0647) Pulse Rate:  [56-116] 116  (07/25 0647) Resp:  [20] 20  (07/25 0647) BP: (93-124)/(69-99) 118/99 mmHg (07/25 0647) SpO2:  [97 %-99 %] 97 % (07/25 0647) Weight:  [140 lb 4.8 oz (63.64 kg)] 140 lb 4.8 oz (63.64 kg) (07/25 0647) Last BM Date: 25-Sep-2011  Weight change: Filed Weights   25-Sep-2011 0908 09/09/11 0628 09/10/11 0647  Weight: 137 lb 9.1 oz (62.4 kg) 138 lb 3.7 oz (62.7 kg) 140 lb 4.8 oz (63.64 kg)    Intake/Output:   Intake/Output Summary (Last 24 hours) at 09/10/11 0905 Last data filed at 09/10/11 5956  Gross per 24 hour  Intake    823 ml  Output   1250 ml  Net   -427 ml     Physical Exam: General:  Chronically ill appearing. Mild SOB when talking.     Neck: supple. JVP at Jaw. Carotids 2+ bilat; no bruits. No lymphadenopathy or thryomegaly appreciated. Cor: PMI lateral displaced. Tachy regular  3/6 systolic Russell and 2/6 systolic TR. +S3. + hepatojugular reflux Lungs: Bibasilar lung sounds diminished with a few rales noted.  Abdomen: soft, nontender, mild distended. No hepatosplenomegaly. No bruits or masses. Good bowel sounds.+ pulsatile liver  Extremities: no cyanosis, clubbing, rash, edema Neuro: alert & orientedx3, cranial nerves grossly intact. moves all 4 extremities w/o difficulty. Affect pleasant  Telemetry: ? Junctional  tachy  Labs: Basic Metabolic Panel:  Lab 09/09/11 3875 September 25, 2011 0244  NA 138 139  K 4.0 5.1  CL 101 105  CO2 20 21  GLUCOSE 134* 107*  BUN 40* 39*  CREATININE 2.18* 2.03*  CALCIUM 9.4 9.4  MG -- --  PHOS -- --    Liver Function Tests:  Lab 09-25-11 0341  AST 37  ALT 26  ALKPHOS 108  BILITOT 1.9*  PROT 6.9  ALBUMIN 3.0*    Lab Sep 25, 2011 0341  LIPASE 36  AMYLASE --    CBC:  Lab 09/10/11 0511 09/09/11 0252 Sep 25, 2011 0244  WBC 7.0 7.9 8.6  NEUTROABS -- -- --  HGB 12.1* 12.7* 12.3*  HCT 37.3* 37.9* 37.5*  MCV 100.5* 99.0 100.8*  PLT 476* 516* 480*    Cardiac Enzymes:  Lab 2011/09/25 1936 09/25/2011 1355 09-25-11 0824  CKTOTAL 65 67 77  CKMB 3.5 3.8 4.2*  CKMBINDEX -- -- --  TROPONINI <0.30 <0.30 <0.30    BNP: BNP (last 3 results)  Basename 09/25/11 0244 08/30/11 0520 08/23/11 0354  PROBNP 19404.0* 7192.0* 18397.0*     Coagulation Studies:  Basename 09/09/11 0252 2011/09/25 1125  LABPROT 15.9* 15.9*  INR 1.24 1.24    Other results:   EKG: Wide QRS 128 ms, LAD, Non specific IVCD  Imaging: CXR on 09-25-2011 *RADIOLOGY REPORT* Clinical Data: Chest pain. PORTABLE CHEST - 1 VIEW Comparison: 08/23/2011. Findings: The heart is enlarged but stable. The mediastinal and hilar contours are stable. No acute pulmonary findings. Stable scattered calcified granulomas. IMPRESSION: Cardiac enlargement but no acute pulmonary findings.   Abd X ray on 2011-09-25 Findings: The right lung base is not well visualized. I suspect there is a small right effusion and possible infiltrate or atelectasis. A chest x-ray may bhelpful for further evaluation. Left renal calculi are noted. The bowel gas pattern is unremarkable. The bony structures are intact. Stable degenerative changes involving the lumbar spine and partial lumbarization of S1.  IMPRESSION:  1. Possible right lower lobe process. Recommend a dedicated chest  x-ray.  2. No plain film findings for an acute abdominal  process. Left  renal calculi are again demonstrated    Medications:     Current Medications:    . amiodarone  200 mg Oral BID  . aspirin  324 mg Oral NOW   Or  . aspirin  300 mg Rectal NOW  . aspirin EC  81 mg Oral Daily  . carvedilol  3.125 mg Oral BID WC  . clopidogrel  75 mg Oral Q breakfast  . furosemide  40 mg Oral Daily  . heparin subcutaneous  5,000 Units Subcutaneous Q8H  . HYDROcodone-acetaminophen  1 tablet Oral QHS  . isosorbide mononitrate  30 mg Oral Daily  . pantoprazole  40 mg Oral Daily  . sodium chloride  3 mL Intravenous Q12H  . DISCONTD: carvedilol  3.125 mg Oral BID WC  . DISCONTD: digoxin  0.0625 mg Oral Daily  . DISCONTD: diltiazem  60 mg Oral BID    Infusions:None   Assessment:  1. A/C biventricular heart failure with low output 2. Dilated NICM with EF 25-30%, severe Russell and TR, pulmonary hypertension. 3. Cardiorenal syndrome 4. A/C renal failure, likely multifactorial including cardiorenal syndrome 5. Junctional tachycardia 6. Hypertension 7. Ongoing Tobacco abuse 8. Remote h/o cocaine and Marijuana abuse   Plan/Discussion:    Patient presented with progressive worsening of SOB, DOE, orthopnea and PND in the setting of NICM dated back to 1997 per his report.  NYHA IIIB-IV symptoms. Recent discharged on 08/31/11 after treatment of A/C CHF with discharge weight 149 lbs. He states that he continued to drop weight after discharge. He is volume overload with signs of biventricular HF on exam even though his weight is 137 lbs on admission which is his "dry weight" per patient report. ProBNP almost tripled at ~19000 comparing to ~7000 one week ago. His renal function is climbing as well over last week, which likely associated with Cardiorenal syndrome.  Plan - strict I+O and daily weight - Low sodium diet and FR 1500 ml - continue ASA, BB, nitrates and add Hydralazine ( SBP 110's now) - increase lasix dosage  - may consider to start Milrinone after  right heart cath - hold ACEI for now due to worsening of renal function.  - lipid panel indicate LDL 86>>Goal < 70, may consider to start low dose Lipitor.  - monitor daily BMP and Mg  For his junctional tachycardia, agree with holding Cardizem and initiation of Amiodarone.     Length of Stay: 2  LI, NA 09/10/2011, 9:05 AM  Patient seen and examined with Dr. Dierdre Searles. We discussed all aspects of the encounter. I agree with the assessment and plan as stated above.   He has severe biventricular HF with low output which seems to be exacerbated by his tachycardia. I suspect tachycardia also likely has contributed to decline in EF. Recommend placing swan today to help optimize his status. Will likely need milrinone support. Will need aggressive control of his rhythm - may be best to switch amio to IV (being mindful of risk of bradycardia). Once more stable - ablation may be best option. Unfortunately not candidate for VAD due to severe RHF. Not sure yet if he would be candidate for transplant in future. Have d/w Drs. Ronalee Red. Will plan RHC today.   Appreciate the consult.   Nile Prisk,MD 12:07 PM

## 2011-09-10 NOTE — Interval H&P Note (Signed)
History and Physical Interval Note:  09/10/2011 4:32 PM  [name]  has presented today for surgery, with the diagnosis of HF.  The various methods of treatment have been discussed with the patient and family. After consideration of risks, benefits and other options for treatment, the patient has consented to  Procedure(s) (LRB): RIGHT HEART CATH (N/A) as a surgical intervention .  The patient's history has been reviewed, patient examined, no change in status, stable for surgery.  I have reviewed the patient's chart and labs.  Questions were answered to the patient's satisfaction.     Daniel Bensimhon

## 2011-09-10 NOTE — Progress Notes (Signed)
ANTICOAGULATION CONSULT NOTE - Initial Consult  Pharmacy Consult for Heparin Indication:  IABP  No Known Allergies  Patient Measurements: Height: 5\' 9"  (175.3 cm) Weight: 140 lb 4.8 oz (63.64 kg) (scale a) IBW/kg (Calculated) : 70.7  Heparin Dosing Weight: 63  Vital Signs: Temp: 97.3 F (36.3 C) (07/25 1559) Temp src: Oral (07/25 1559) BP: 123/94 mmHg (07/25 1559) Pulse Rate: 109  (07/25 1559)  Labs:  [name] 09/10/11 9604 09/10/11 0511 09/09/11 0252 09/08/11 1936 09/08/11 1739 09/08/11 1355 09/08/11 1125 09/08/11 0824 09/08/11 0244  HGB -- 12.1* 12.7* -- -- -- -- -- --  HCT -- 37.3* 37.9* -- -- -- -- -- 37.5*  PLT -- 476* 516* -- -- -- -- -- 480*  APTT -- -- -- -- -- -- 29 -- --  LABPROT -- -- 15.9* -- -- -- 15.9* -- --  INR -- -- 1.24 -- -- -- 1.24 -- --  HEPARINUNFRC -- -- <0.10* -- <0.10* -- -- -- --  CREATININE 1.75* -- 2.18* -- -- -- -- -- 2.03*  CKTOTAL -- -- -- 65 -- 67 -- 77 --  CKMB -- -- -- 3.5 -- 3.8 -- 4.2* --  TROPONINI -- -- -- <0.30 -- <0.30 -- <0.30 --   Estimated Creatinine Clearance: 39.9 ml/min (by C-G formula based on Cr of 1.75).  Medical History: Past Medical History  Diagnosis Date  . Hypertension   . GERD (gastroesophageal reflux disease)   . Hiatal hernia   . CHF (congestive heart failure)   . Cardiomyopathy     nonischemic   . Shortness of breath   . Pneumonia     hx of pna  . Atrial fibrillation      previously listed in DC summary from August 31, 2011, ? atrial tachycardia   . COPD (chronic obstructive pulmonary disease)     previously listed in DC summary from Feb 2012   . Chronic kidney disease (CKD), stage II (mild)     listed in chart although patient denies    Medications:  Prescriptions prior to admission  Medication Sig Dispense Refill  . albuterol (PROVENTIL HFA;VENTOLIN HFA) 108 (90 BASE) MCG/ACT inhaler Inhale 2 puffs into the lungs every 6 (six) hours as needed. For shortness of breath      . aspirin EC 81 MG EC tablet  Take 1 tablet (81 mg total) by mouth daily.      Marland Kitchen diltiazem (CARDIZEM) 30 MG tablet Take 2 tablets (60 mg total) by mouth 2 (two) times daily.  60 tablet  1  . esomeprazole (NEXIUM) 40 MG capsule Take 40 mg by mouth daily before breakfast.      . furosemide (LASIX) 40 MG tablet Take 40 mg by mouth daily.        Marland Kitchen HYDROcodone-acetaminophen (NORCO) 5-325 MG per tablet Take 1 tablet by mouth at bedtime. For pain      . isosorbide-hydrALAZINE (BIDIL) 20-37.5 MG per tablet Take 0.5-1 tablets by mouth 3 (three) times daily.       . Multiple Vitamins-Minerals (MULTIVITAMINS THER. W/MINERALS) TABS Take 0.5 tablets by mouth 2 (two) times daily.       . nebivolol (BYSTOLIC) 10 MG tablet Take 5 mg by mouth 2 (two) times daily.       . potassium chloride SA (K-DUR,KLOR-CON) 20 MEQ tablet Take 1 tablet (20 mEq total) by mouth 2 (two) times daily.  60 tablet  1    Assessment: 61 yo male admitted with shortness of breath and  leg edema.  He has a history of cardiomyopathy and was seen in the cath lab today for further work up.  He now has an IABP and we have been asked to initiate Heparin therapy for him.  His past medical history does not reveal any history of bleeding complications.  His labs show some slight anemia with H/H of 12.1/37.3.  His platelets are 476K.  He received one dose of SQ heparin for DVT prophylaxis this morning.  We will utilize his TBW for dosing and begin therapy at ~ 10 units/kg with a f/u heparin level in 8 hours.  Goal of Therapy:  Heparin level 0.3-0.5 IU/ml Monitor platelets by anticoagulation protocol: Yes   Plan:   Begin IV heparin without a bolus at 600 units/hr  Obtain a heparin level 8 hours after starting therapy  Daily CBC and heparin therapy  Discontinue SQ heparin while on IV infusion.  Nadara Mustard, PharmD., MS Clinical Pharmacist Pager:  [phone]  Thank you for allowing pharmacy to be part of this patients care team. 09/10/2011,5:51 PM

## 2011-09-10 NOTE — Progress Notes (Signed)
Subjective:  No chest pain but shortness of breath.  Objective:  Vital Signs in the last 24 hours: Temp:  [97.3 F (36.3 C)-97.5 F (36.4 C)] 97.3 F (36.3 C) (07/25 0647) Pulse Rate:  [56-116] 116  (07/25 0647) Cardiac Rhythm:  [-] Normal sinus rhythm (07/24 2007) Resp:  [20] 20  (07/25 0647) BP: (93-124)/(69-99) 118/99 mmHg (07/25 0647) SpO2:  [97 %-99 %] 97 % (07/25 0647) Weight:  [63.64 kg (140 lb 4.8 oz)] 63.64 kg (140 lb 4.8 oz) (07/25 0647)  Physical Exam: BP Readings from Last 1 Encounters:  09/10/11 118/99    Wt Readings from Last 1 Encounters:  09/10/11 63.64 kg (140 lb 4.8 oz)    Weight change: 1.24 kg (2 lb 11.7 oz)  HEENT: [place]/AT, Eyes-Brown, PERL, EOMI, Conjunctiva-Pink, Sclera-Non-icteric Neck: + JVD, No bruit, Trachea midline. Lungs:  Clear, Bilateral. Cardiac:  Regular rhythm, normal S1 and S2, no S3. II/VI systolic murmur Abdomen:  Soft, non-tender. Extremities:  No edema present. No cyanosis. No clubbing. CNS: AxOx3, Cranial nerves grossly intact, moves all 4 extremities. Right handed. Skin: Warm and dry.   Intake/Output from previous day: 07/24 0701 - 07/25 0700 In: 823 [P.O.:820; I.V.:3] Out: 1550 [Urine:1550]    Lab Results: BMET    Component Value Date/Time   NA 138 09/09/2011 0252   K 4.0 09/09/2011 0252   CL 101 09/09/2011 0252   CO2 20 09/09/2011 0252   GLUCOSE 134* 09/09/2011 0252   BUN 40* 09/09/2011 0252   CREATININE 2.18* 09/09/2011 0252   CALCIUM 9.4 09/09/2011 0252   GFRNONAA 31* 09/09/2011 0252   GFRAA 36* 09/09/2011 0252   CBC    Component Value Date/Time   WBC 7.0 09/10/2011 0511   RBC 3.71* 09/10/2011 0511   HGB 12.1* 09/10/2011 0511   HCT 37.3* 09/10/2011 0511   PLT 476* 09/10/2011 0511   MCV 100.5* 09/10/2011 0511   MCH 32.6 09/10/2011 0511   MCHC 32.4 09/10/2011 0511   RDW 14.0 09/10/2011 0511   LYMPHSABS 1.2 11/10/2010 1019   MONOABS 0.7 11/10/2010 1019   EOSABS 0.3 11/10/2010 1019   BASOSABS 0.1 11/10/2010 1019   CARDIAC  ENZYMES Lab Results  Component Value Date   CKTOTAL 65 09/08/2011   CKMB 3.5 09/08/2011   TROPONINI <0.30 09/08/2011    Assessment/Plan:  Patient Active Hospital Problem List: Chest pain  MI ruled out with normal cardiac enzymes.  Chronic left heart systolic failure, Stable  Dilated cardiomyopathy,  Appreciate EP consult for ICD or Bi-V ICD  CKD, II  Chronic gastritis  Awaiting second opinion by Dr. Teressa Lower for chronic congestive heart failure with valvular heart disease and pulmonary hypertension.. Check BMET Increase activity   LOS: 2 days    [name]  MD  09/10/2011, 7:44 AM

## 2011-09-10 NOTE — Progress Notes (Signed)
Utilization review completed.  

## 2011-09-10 NOTE — Clinical Documentation Improvement (Signed)
CHEST PAIN DOCUMENTATION CLARIFICATION QUERY  THIS DOCUMENT IS NOT A PERMANENT PART OF THE MEDICAL RECORD           09/10/11  Dr. Algie Coffer and/or Associates  In an effort to better capture your patient's severity of illness, reflect appropriate length of stay and utilization of resources, a review of the patient medical record has revealed the following indicators:  Admitted with diagnosis of "Chest Pain"  MI ruled out by cardiac enzymes.  Documenting "Chronic Gastritis" per Progress  Notes  09/08/11 - proBNP this admission - 19404.0  08/30/11 - proBNP - 7192.0  Known Biventricular Heart Failure  EP Consult this admission  Advanced Heart Failure Consult this admission  Theone Murdoch placement pending     Based on your clinical judgment, please clarify and document, in the progress notes and discharge summary, the Principle Diagnosis requiring this hospital admission:   - Chest Pain due to Chronic Gastritis   - Acute Biventricular Heart Failure   - Acute Systolic heart Failure   - Other Principle Diagnosis   You may use Possible, Probable, or Suspected with inpatient documentation.   Possible, Probable, or Suspected diagnoses MUST ALSO be documented at the time of discharge if they ARE STILL possible, probable, or suspected.   In responding to this query please exercise your independent judgment.   The fact that a query is asked, does not imply that any particular answer is desired or expected.   Reviewed: additional documentation in the medical record  Thank You,  Jerral Ralph  RN BSN CCDS Certified Clinical Documentation Specialist: Cell   [phone]  Health Information Management [place]  TO RESPOND TO THE THIS QUERY, FOLLOW THE INSTRUCTIONS BELOW:  1. If needed, update documentation for the patient's encounter via the notes activity.  2. Access this query again and click edit on the In [employer].  3. After updating, or not, click  F2 to complete all highlighted (required) fields concerning your review. Select "additional documentation in the medical record" OR "no additional documentation provided".  4. Click Sign note button.  5. The deficiency will fall out of your In Basket *Please let us know if you are not able to complete this workflow by phone or e-mail (listed below).

## 2011-09-11 DIAGNOSIS — I471 Supraventricular tachycardia: Secondary | ICD-10-CM

## 2011-09-11 DIAGNOSIS — I2789 Other specified pulmonary heart diseases: Secondary | ICD-10-CM

## 2011-09-11 DIAGNOSIS — I4719 Other supraventricular tachycardia: Secondary | ICD-10-CM

## 2011-09-11 DIAGNOSIS — I272 Pulmonary hypertension, unspecified: Secondary | ICD-10-CM | POA: Diagnosis present

## 2011-09-11 LAB — CARBOXYHEMOGLOBIN
Methemoglobin: 0.8 % (ref 0.0–1.5)
O2 Saturation: 62.3 %
Total hemoglobin: 11 g/dL — ABNORMAL LOW (ref 13.5–18.0)

## 2011-09-11 LAB — CBC
HCT: 35.4 % — ABNORMAL LOW (ref 39.0–52.0)
MCV: 99.4 fL (ref 78.0–100.0)
RDW: 14 % (ref 11.5–15.5)
WBC: 6.4 10*3/uL (ref 4.0–10.5)

## 2011-09-11 LAB — BASIC METABOLIC PANEL
BUN: 30 mg/dL — ABNORMAL HIGH (ref 6–23)
Chloride: 101 mEq/L (ref 96–112)
Creatinine, Ser: 1.57 mg/dL — ABNORMAL HIGH (ref 0.50–1.35)
GFR calc Af Amer: 53 mL/min — ABNORMAL LOW (ref >90)
Glucose, Bld: 97 mg/dL (ref 70–99)

## 2011-09-11 LAB — HEPARIN LEVEL (UNFRACTIONATED)
Heparin Unfractionated: <0.1 IU/mL — ABNORMAL LOW (ref 0.30–0.70)
Heparin Unfractionated: <0.1 IU/mL — ABNORMAL LOW (ref 0.30–0.70)

## 2011-09-11 LAB — POCT I-STAT 3, ART BLOOD GAS (G3+)
Acid-base deficit: 2 mmol/L (ref 0.0–2.0)
O2 Saturation: 91 %
pO2, Arterial: 64 mmHg — ABNORMAL LOW (ref 80.0–100.0)

## 2011-09-11 MED ORDER — OXYCODONE HCL 5 MG PO TABS
5.0000 mg | ORAL_TABLET | Freq: Every day | ORAL | Status: DC
  Administered 2011-09-11 – 2011-09-13 (×3): 5 mg via ORAL
  Filled 2011-09-11 (×3): qty 1

## 2011-09-11 MED ORDER — HYDRALAZINE HCL 25 MG PO TABS
12.5000 mg | ORAL_TABLET | Freq: Three times a day (TID) | ORAL | Status: DC
  Administered 2011-09-11: 12.5 mg via ORAL
  Filled 2011-09-11 (×5): qty 0.5

## 2011-09-11 MED ORDER — "THROMBIN-CMC-CACL 3""X3"" EX PADS": 1.0000 | MEDICATED_PAD | Freq: Once | CUTANEOUS | Status: DC

## 2011-09-11 MED ORDER — HEPARIN (PORCINE) IN NACL 100-0.45 UNIT/ML-% IJ SOLN
1300.0000 [IU]/h | INTRAMUSCULAR | Status: DC
  Administered 2011-09-11: 1050 [IU]/h via INTRAVENOUS
  Administered 2011-09-12 (×2): 1300 [IU]/h via INTRAVENOUS
  Filled 2011-09-11 (×3): qty 250

## 2011-09-11 MED ORDER — HYDROMORPHONE HCL PF 1 MG/ML IJ SOLN
1.0000 mg | Freq: Three times a day (TID) | INTRAMUSCULAR | Status: DC | PRN
  Administered 2011-09-11 – 2011-09-13 (×6): 1 mg via INTRAVENOUS
  Filled 2011-09-11 (×6): qty 1

## 2011-09-11 MED ORDER — OXYCODONE-ACETAMINOPHEN 5-325 MG PO TABS
1.0000 | ORAL_TABLET | ORAL | Status: DC | PRN
  Administered 2011-09-12 – 2011-09-13 (×2): 2 via ORAL
  Filled 2011-09-11 (×4): qty 2

## 2011-09-11 MED ORDER — SPIRONOLACTONE 12.5 MG HALF TABLET
12.5000 mg | ORAL_TABLET | Freq: Every day | ORAL | Status: DC
  Administered 2011-09-11: 12.5 mg via ORAL
  Filled 2011-09-11 (×2): qty 1

## 2011-09-11 MED ORDER — LIDOCAINE-EPINEPHRINE 1 %-1:100000 IJ SOLN
INTRAMUSCULAR | Status: AC
  Filled 2011-09-11: qty 1

## 2011-09-11 MED ORDER — ASPIRIN 81 MG PO CHEW
CHEWABLE_TABLET | ORAL | Status: AC
  Administered 2011-09-11: 09:00:00
  Filled 2011-09-11: qty 1

## 2011-09-11 MED ORDER — POTASSIUM CHLORIDE CRYS ER 20 MEQ PO TBCR
20.0000 meq | EXTENDED_RELEASE_TABLET | Freq: Two times a day (BID) | ORAL | Status: DC
  Administered 2011-09-11 – 2011-09-12 (×3): 20 meq via ORAL
  Filled 2011-09-11 (×5): qty 1

## 2011-09-11 MED ORDER — "THROMBI-PAD 3""X3"" EX PADS"
1.0000 | MEDICATED_PAD | Freq: Once | CUTANEOUS | Status: DC
  Filled 2011-09-11: qty 1

## 2011-09-11 MED ORDER — FUROSEMIDE 10 MG/ML IJ SOLN
80.0000 mg | Freq: Three times a day (TID) | INTRAMUSCULAR | Status: DC
  Administered 2011-09-11 – 2011-09-12 (×2): 80 mg via INTRAVENOUS
  Filled 2011-09-11 (×5): qty 8

## 2011-09-11 MED ORDER — METOLAZONE 2.5 MG PO TABS
2.5000 mg | ORAL_TABLET | Freq: Once | ORAL | Status: AC
  Administered 2011-09-11: 2.5 mg via ORAL
  Filled 2011-09-11: qty 1

## 2011-09-11 MED ORDER — MAGNESIUM SULFATE 40 MG/ML IJ SOLN
2.0000 g | Freq: Once | INTRAMUSCULAR | Status: AC
  Administered 2011-09-11: 2 g via INTRAVENOUS
  Filled 2011-09-11: qty 50

## 2011-09-11 MED ORDER — ISOSORBIDE MONONITRATE ER 30 MG PO TB24
30.0000 mg | ORAL_TABLET | Freq: Every day | ORAL | Status: DC
  Administered 2011-09-11 – 2011-09-22 (×12): 30 mg via ORAL
  Filled 2011-09-11 (×12): qty 1

## 2011-09-11 NOTE — Progress Notes (Signed)
Subjective:  Breathing better. Did not sleep well. Also had right hand numbness and weakness. Afebrile. Additional diuresis. Now on IABP with poor cardiac output and pulmonary hypertension.  Objective:  Vital Signs in the last 24 hours: Temp:  [97 F (36.1 C)-98 F (36.7 C)] 97 F (36.1 C) (07/26 0800) Pulse Rate:  [57-113] 64  (07/26 0800) Cardiac Rhythm:  [-] Normal sinus rhythm (07/26 0800) Resp:  [13-25] 16  (07/26 0800) BP: (115-146)/(65-94) 146/74 mmHg (07/26 0800) SpO2:  [90 %-100 %] 97 % (07/26 0800) Weight:  [62.2 kg (137 lb 2 oz)-63 kg (138 lb 14.2 oz)] 62.2 kg (137 lb 2 oz) (07/26 0500)  Physical Exam: BP Readings from Last 1 Encounters:  09/11/11 146/74    Wt Readings from Last 1 Encounters:  09/11/11 62.2 kg (137 lb 2 oz)    Weight change: -0.64 kg (-1 lb 6.6 oz)  HEENT: [place]/AT, Eyes-Brown, PERL, EOMI, Conjunctiva-Pink, Sclera-Non-icteric Neck: No JVD, No bruit, Trachea midline. Lungs:  Clear, Bilateral. Cardiac:  Regular rhythm, normal S1 and S2, no S3.  Abdomen:  Soft, non-tender. Extremities:  No edema present. No cyanosis. No clubbing. CNS: AxOx3, Cranial nerves grossly intact, moves all 4 extremities. Right handed. Able to raise right arm. Skin: Warm and dry.   Intake/Output from previous day: 07/25 0701 - 07/26 0700 In: 1474.1 [P.O.:440; I.V.:1034.1] Out: 2575 [Urine:2575]    Lab Results: BMET    Component Value Date/Time   NA 138 09/11/2011 0141   K 3.9 09/11/2011 0141   CL 101 09/11/2011 0141   CO2 27 09/11/2011 0141   GLUCOSE 97 09/11/2011 0141   BUN 30* 09/11/2011 0141   CREATININE 1.57* 09/11/2011 0141   CALCIUM 9.1 09/11/2011 0141   GFRNONAA 46* 09/11/2011 0141   GFRAA 53* 09/11/2011 0141   CBC    Component Value Date/Time   WBC 6.4 09/11/2011 0141   RBC 3.56* 09/11/2011 0141   HGB 11.6* 09/11/2011 0141   HCT 35.4* 09/11/2011 0141   PLT 470* 09/11/2011 0141   MCV 99.4 09/11/2011 0141   MCH 32.6 09/11/2011 0141   MCHC 32.8 09/11/2011 0141   RDW  14.0 09/11/2011 0141   LYMPHSABS 1.2 11/10/2010 1019   MONOABS 0.7 11/10/2010 1019   EOSABS 0.3 11/10/2010 1019   BASOSABS 0.1 11/10/2010 1019   CARDIAC ENZYMES Lab Results  Component Value Date   CKTOTAL 65 09/08/2011   CKMB 3.5 09/08/2011   TROPONINI <0.30 09/08/2011    Assessment/Plan:  Patient Active Hospital Problem List: Cardiogenic shock (09/10/2011)   Assessment: Stable   Plan: per HF team. Cardiorenal syndrome (09/10/2011)   Assessment: Stable with improving Creatinine Pulmonary hypertension Paroxysmal atrial tacycardia Brachial paresis (09/10/2011)   Assessment: Stable   Plan: Per neurology. Chest pain probably secondary to GERD MI ruled out Acute on chronic systolic heart failure-POA and Principle diagnosis Dilated and non ischemic cardiomyopathy CKD, stage II GERD Hypertension  COPD  Appreciate HF team and neurology assistance. Discussed future care with patient and wife and agrees to comply with treatment in future. Leaning towards medical treatment as much as possible.   LOS: 3 days    [name]  MD  09/11/2011, 8:21 AM

## 2011-09-11 NOTE — Progress Notes (Addendum)
Advanced Heart Failure Rounding Note  Referring Physician: Dr. Sherryl Manges  Primary Physician: Dr. [name]  Primary Cardiologist: Dr. [name]   Weight range: 140-144 lbs. Discharge weight 149 lbs on 08/31/11  Reason for Consultation: NICM with Biventricular failure with Pro BNP 19404 (7192 one week ago)   Subjective:    Mr Garrido is a 61 year old man with Past medical history significant for Biventricular heart failure, chronic systolic HF, Dilated NICM ( since 1997) with LVEF 25-30%, severe MR, severe TR, pulmonary hypertension, HTN, CKD II, Tobacco/remote cocaine and Marijuana abuse and frequent admissions for CHF exacerbation, who was admitted with chest pain and orthopnea on 09/08/11. He has ruled out MI with normal cardiac enzymes. Last cardiac cath in 01/2011 showed normal coronary arteries. He also reported DOE, orthopnea and PND. He was recently hospitalized in June and July this year for acute decompensated CHF ( a total 6 admission since Feb, 2012). 2 D echo in 08/2011: LVEF 25-30% diffuse hypokinesis, severe MR, severe TR, RV moderately to severely reduced. PA 57.  He has history of medical noncompliance but reports compliance with his medications and low sodium diet currently. He is currently taking Lasix, Bystolic and Bidil.   Patient continues to have NYHA IV symptoms. He underwent RHC yesterday with the following findings: RA = 17  RV = 87/14/18  PA = 87/56 (69)  PCW = 31  Fick cardiac output/index = 2.1/1.2  PVR = 12.6 Woods  FA sat = 98%  PA sat = 28%, 28  Patient was transferred to ICU with support of IABP and Milrinone after his RHC. His diuretic therapy was escalated to IV Lasix 80 mg IV BID. IV amiodarone was started for his tachycradia control. Patient states that he feels "much better". Denies any SOB, PND since yesterday. No chest pain or chest pressure. He report moderate pain and soreness on his neck and right groin area where he has had catheters  insertion, and he only achieve temporary pain relief with Fentanyl.  Nursing staff reports intermittent oozing from his right groin IABP catheter site.   [place] #s today:  RA 15 PA 88/39 (57) PCW 34 CO/CI 3.1/1.7 SVR 1765 PVR 7.5 Woods  Objective:   Weight Range:  Vital Signs:   Temp:  [97 F (36.1 C)-98 F (36.7 C)] 97.2 F (36.2 C) (07/26 0700) Pulse Rate:  [57-113] 59  (07/26 0700) Resp:  [13-25] 17  (07/26 0700) BP: (115-144)/(65-94) 141/69 mmHg (07/26 0700) SpO2:  [90 %-100 %] 98 % (07/26 0700) Weight:  [137 lb 2 oz (62.2 kg)-138 lb 14.2 oz (63 kg)] 137 lb 2 oz (62.2 kg) (07/26 0500) Last BM Date: 09/08/11  Weight change: Filed Weights   09/10/11 0647 09/10/11 2000 09/11/11 0500  Weight: 140 lb 4.8 oz (63.64 kg) 138 lb 14.2 oz (63 kg) 137 lb 2 oz (62.2 kg)    Intake/Output:   Intake/Output Summary (Last 24 hours) at 09/11/11 0743 Last data filed at 09/11/11 0700  Gross per 24 hour  Intake 1474.09 ml  Output   2575 ml  Net -1100.91 ml     Physical Exam:  General: Chronically ill appearing. NAD  Neck: supple. JVP 10. Carotids 2+ bilat; no bruits. No lymphadenopathy or thryomegaly appreciated.  Cor: PMI lateral displaced. Tachy regular 3/6 systolic MR and 2/6 systolic TR. +S3. + hepatojugular reflux  Lungs: Bibasilar lung sounds diminished with a few rales noted.  Abdomen: soft, nontender, mild distended ( less than before). No hepatosplenomegaly.  No bruits or masses. Good bowel sounds.+ pulsatile liver  Extremities: no cyanosis, clubbing, rash, edema  Neuro: alert & orientedx3, cranial nerves grossly intact. moves all 4 extremities w/o difficulty. Affect pleasant  Telemetry:SR  Labs: Basic Metabolic Panel:  Lab 09/11/11 0454 09/10/11 0838 09/09/11 0252 09/08/11 0244  NA 138 138 138 139  K 3.9 3.5 4.0 5.1  CL 101 102 101 105  CO2 27 22 20 21   GLUCOSE 97 127* 134* 107*  BUN 30* 33* 40* 39*  CREATININE 1.57* 1.75* 2.18* 2.03*  CALCIUM 9.1 9.2 9.4 --  MG  1.6 -- -- --  PHOS -- -- -- --    Liver Function Tests:  Lab 09/08/11 0341  AST 37  ALT 26  ALKPHOS 108  BILITOT 1.9*  PROT 6.9  ALBUMIN 3.0*    Lab 09/08/11 0341  LIPASE 36  AMYLASE --    CBC:  Lab 09/11/11 0141 09/10/11 2015 09/10/11 0511 09/09/11 0252 09/08/11 0244  WBC 6.4 6.6 7.0 7.9 8.6  NEUTROABS -- -- -- -- --  HGB 11.6* 12.2* 12.1* 12.7* 12.3*  HCT 35.4* 36.8* 37.3* 37.9* 37.5*  MCV 99.4 100.0 100.5* 99.0 100.8*  PLT 470* 505* 476* 516* 480*    Cardiac Enzymes:  Lab 09/08/11 1936 09/08/11 1355 09/08/11 0824  CKTOTAL 65 67 77  CKMB 3.5 3.8 4.2*  CKMBINDEX -- -- --  TROPONINI <0.30 <0.30 <0.30    BNP: BNP (last 3 results)  Basename 09/08/11 0244 08/30/11 0520 08/23/11 0354  PROBNP 19404.0* 7192.0* 18397.0*     Other results:  EKG:   Imaging:  No results found.   Medications:     Scheduled Medications:    . amiodarone  150 mg Intravenous Once  . aspirin EC  81 mg Oral Daily  . clopidogrel  75 mg Oral Q breakfast  . fentaNYL      . fentaNYL      . furosemide  80 mg Intravenous BID  . heparin      . heparin      . HYDROcodone-acetaminophen  1 tablet Oral QHS  . lidocaine      . midazolam      . pantoprazole  40 mg Oral Daily  . DISCONTD: amiodarone  200 mg Oral BID  . DISCONTD: aspirin  324 mg Oral Pre-Cath  . DISCONTD: carvedilol  3.125 mg Oral BID WC  . DISCONTD: carvedilol  3.125 mg Oral BID WC  . DISCONTD: digoxin  0.0625 mg Oral Daily  . DISCONTD: furosemide  40 mg Oral Daily  . DISCONTD: heparin subcutaneous  5,000 Units Subcutaneous Q8H  . DISCONTD: isosorbide mononitrate  30 mg Oral Daily  . DISCONTD: sodium chloride  3 mL Intravenous Q12H  . DISCONTD: sodium chloride  3 mL Intravenous Q12H  . DISCONTD: sodium chloride  3 mL Intravenous Q12H     Infusions:    . amiodarone (NEXTERONE PREMIX) 360 mg/200 mL dextrose 1 mg/min (09/10/11 2133)   Followed by  . amiodarone (NEXTERONE PREMIX) 360 mg/200 mL dextrose 0.5  mg/min (09/11/11 0535)  . heparin 700 Units/hr (09/11/11 0300)  . milrinone 0.375 mcg/kg/min (09/10/11 2206)     PRN Medications:  sodium chloride, sodium chloride, acetaminophen, acetaminophen, albuterol, calcium carbonate, fentaNYL, HYDROcodone-acetaminophen, nitroGLYCERIN, ondansetron (ZOFRAN) IV, ondansetron (ZOFRAN) IV, DISCONTD: sodium chloride, DISCONTD: fentaNYL, DISCONTD: sodium chloride, DISCONTD: sodium chloride, DISCONTD: sodium chloride   Assessment:  1. Cardiogenic shock with elevated filling pressure 2. A/C biventricular heart failure with low output and cardiogenic shock (  primary diagnosis) 3. 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 severe MR and TR,  5.Severe pulmonary hypertension.  6. Cardiorenal syndrome  7. A/C renal failure, likely multifactorial including cardiorenal syndrome  8. Junctional/retrograde tachycardia  9. Hypertension  10. hypomagnesemia 11. Ongoing Tobacco abuse  12. Remote h/o cocaine and Marijuana abuse   Plan/Discussion:    Length of Stay: 3 LI, NA 09/11/2011, 7:43 AM  Patient seen and examined with Dr. Dierdre Searles, MD. We discussed all aspects of the encounter. I agree with the note as stated above.   Hemodynamics much improved with IABP, milrinone and restoration of sinus rhythm. I think maintaining SR will be critically important to our ability to be able to wean IABP and hopefully also let his LV and RV recover some of their function. Will continue IV amio and current support as is. PCWP still quite high. Will increase IV lasix to tid and add spiro.   R arm strength has recovered. He has had significant oozing from R groin site. I injected epi/lidocaine at the site and held pressure for 10 minutes with good result.   Plan will be to continue IABP, milrinone and IV amio. Will begin to titrate on hydralazine/nitrates as tolerated. Keep IABP in over weekend and hopefully can wean on Monday. Will supp mag and watch H/H.  The patient is critically ill with  multiple organ systems failure and requires high complexity decision making for assessment and support, frequent evaluation and titration of therapies, application of advanced monitoring technologies and extensive interpretation of multiple databases.   Critical Care Time devoted to patient care services described in this note is 50 Minutes.  Truman Hayward 2:53 PM

## 2011-09-11 NOTE — Progress Notes (Signed)
Subjective: No new complaints. No longer experiencing numbness and right upper extremity. Strength is also improved.  Objective: Current vital signs: BP 146/74  Pulse 64  Temp 97 F (36.1 C) (Core (Comment))  Resp 16  Ht 5\' 9"  (1.753 m)  Wt 62.2 kg (137 lb 2 oz)  BMI 20.25 kg/m2  SpO2 97%  Neurologic Exam: Alert and in no acute distress. Mental status was normal. Strength of right upper extremity was normal proximally and distally. Sensory evaluation of right upper extremity was also normal.  Medications:  Scheduled:   . amiodarone  150 mg Intravenous Once  . aspirin      . aspirin EC  81 mg Oral Daily  . clopidogrel  75 mg Oral Q breakfast  . fentaNYL      . fentaNYL      . furosemide  80 mg Intravenous BID  . heparin      . heparin      . lidocaine      . magnesium sulfate 1 - 4 g bolus IVPB  2 g Intravenous Once  . midazolam      . oxyCODONE  5 mg Oral QHS  . pantoprazole  40 mg Oral Daily  . Thrombi-Pad  1 each Topical Once  . DISCONTD: amiodarone  200 mg Oral BID  . DISCONTD: aspirin  324 mg Oral Pre-Cath  . DISCONTD: carvedilol  3.125 mg Oral BID WC  . DISCONTD: furosemide  40 mg Oral Daily  . DISCONTD: heparin subcutaneous  5,000 Units Subcutaneous Q8H  . DISCONTD: HYDROcodone-acetaminophen  1 tablet Oral QHS  . DISCONTD: isosorbide mononitrate  30 mg Oral Daily  . DISCONTD: sodium chloride  3 mL Intravenous Q12H  . DISCONTD: sodium chloride  3 mL Intravenous Q12H  . DISCONTD: sodium chloride  3 mL Intravenous Q12H  . DISCONTD: Thrombin-CMC-CaCl  1 patch Apply externally Once   ZOX:WRUEAV chloride, sodium chloride, acetaminophen, acetaminophen, albuterol, calcium carbonate, HYDROmorphone (DILAUDID) injection, nitroGLYCERIN, ondansetron (ZOFRAN) IV, oxyCODONE-acetaminophen, DISCONTD: sodium chloride, DISCONTD: fentaNYL, DISCONTD: fentaNYL, DISCONTD: HYDROcodone-acetaminophen, DISCONTD: ondansetron (ZOFRAN) IV, DISCONTD: sodium chloride, DISCONTD: sodium  chloride, DISCONTD: sodium chloride  Assessment/Plan: Transient brachial plexus dysfunction secondary to affects of local anesthetic (lidocaine), resolved. No further neurological intervention is indicated. We will sign off on patient's care at this point.  C.R. Roseanne Reno, MD [facility] [phone]  09/11/2011  9:39 AM

## 2011-09-11 NOTE — Progress Notes (Signed)
ANTICOAGULATION CONSULT NOTE - Follow UP  Pharmacy Consult for Heparin Indication:  IABP  Patient Measurements: Height: 5\' 9"  (175.3 cm) Weight: 137 lb 2 oz (62.2 kg) IBW/kg (Calculated) : 70.7  Heparin Dosing Weight: 63  Vital Signs: Temp: 97.3 F (36.3 C) (07/26 1000) Temp src: Core (Comment) (07/26 0700) BP: 142/72 mmHg (07/26 1000) Pulse Rate: 60  (07/26 1000)  Labs:  Alvira Philips 09/11/11 0915 09/11/11 0141 09/10/11 2015 09/10/11 0838 09/10/11 0511 09/09/11 0252 09/08/11 1936 09/08/11 1355 09/08/11 1125  HGB -- 11.6* 12.2* -- -- -- -- -- --  HCT -- 35.4* 36.8* -- 37.3* -- -- -- --  PLT -- 470* 505* -- 476* -- -- -- --  APTT -- -- -- -- -- -- -- -- 29  LABPROT -- -- -- -- -- 15.9* -- -- 15.9*  INR -- -- -- -- -- 1.24 -- -- 1.24  HEPARINUNFRC <0.10* <0.10* 0.38 -- -- -- -- -- --  CREATININE -- 1.57* -- 1.75* -- 2.18* -- -- --  CKTOTAL -- -- -- -- -- -- 65 67 --  CKMB -- -- -- -- -- -- 3.5 3.8 --  TROPONINI -- -- -- -- -- -- <0.30 <0.30 --   Estimated Creatinine Clearance: 43.5 ml/min (by C-G formula based on Cr of 1.57).   Assessment: 61 yo male admitted with shortness of breath and leg edema.  He has a history of cardiomyopathy and was seen in the cath lab for further work up.  He now has an IABP.    Patient continues to have undetectable level on heparin. He also continues to have oozing from line site. MD has seen patient earlier this morning and applied additional pressure and a shot of epinephrine to reduce further oozing. Nursing may place thrombin pad if continues.  Goal of Therapy:  Heparin level 0.2-0.5 IU/ml Monitor platelets by anticoagulation protocol: Yes   Plan:   Continue IV heparin at 850 units/hr  Obtain a heparin level in 6h  Daily CBC and heparin therapy  Sheppard Coil PharmD. Clinical Pharmacist Pager:  [phone]  Thank you for allowing pharmacy to be part of this patients care team. 09/11/2011,10:57 AM

## 2011-09-11 NOTE — Progress Notes (Signed)
ANTICOAGULATION CONSULT NOTE - Follow Up  Pharmacy Consult for Heparin Indication:  IABP  No Known Allergies  Patient Measurements: Height: 5\' 9"  (175.3 cm) Weight: 138 lb 14.2 oz (63 kg) IBW/kg (Calculated) : 70.7  Heparin Dosing Weight: 63  Vital Signs: Temp: 97 F (36.1 C) (07/26 0000) Temp src: Oral (07/26 0000) BP: 139/74 mmHg (07/26 0200) Pulse Rate: 61  (07/26 0200)  Labs:  Alvira Philips 09/11/11 0141 09/10/11 2015 09/10/11 3086 09/10/11 0511 09/09/11 0252 09/08/11 1936 09/08/11 1355 09/08/11 1125 09/08/11 0824 09/08/11 0244  HGB 11.6* 12.2* -- -- -- -- -- -- -- --  HCT 35.4* 36.8* -- 37.3* -- -- -- -- -- --  PLT 470* 505* -- 476* -- -- -- -- -- --  APTT -- -- -- -- -- -- -- 29 -- --  LABPROT -- -- -- -- 15.9* -- -- 15.9* -- --  INR -- -- -- -- 1.24 -- -- 1.24 -- --  HEPARINUNFRC <0.10* 0.38 -- -- <0.10* -- -- -- -- --  CREATININE -- -- 1.75* -- 2.18* -- -- -- -- 2.03*  CKTOTAL -- -- -- -- -- 65 67 -- 77 --  CKMB -- -- -- -- -- 3.5 3.8 -- 4.2* --  TROPONINI -- -- -- -- -- <0.30 <0.30 -- <0.30 --   Estimated Creatinine Clearance: 39.5 ml/min (by C-G formula based on Cr of 1.75).  Medical History: Past Medical History  Diagnosis Date  . Hypertension   . GERD (gastroesophageal reflux disease)   . Hiatal hernia   . CHF (congestive heart failure)   . Cardiomyopathy     nonischemic   . Shortness of breath   . Pneumonia     hx of pna  . Atrial fibrillation      previously listed in DC summary from August 31, 2011, ? atrial tachycardia   . COPD (chronic obstructive pulmonary disease)     previously listed in DC summary from Feb 2012   . Chronic kidney disease (CKD), stage II (mild)     listed in chart although patient denies    Medications:  Prescriptions prior to admission  Medication Sig Dispense Refill  . albuterol (PROVENTIL HFA;VENTOLIN HFA) 108 (90 BASE) MCG/ACT inhaler Inhale 2 puffs into the lungs every 6 (six) hours as needed. For shortness of breath        . aspirin EC 81 MG EC tablet Take 1 tablet (81 mg total) by mouth daily.      Marland Kitchen diltiazem (CARDIZEM) 30 MG tablet Take 2 tablets (60 mg total) by mouth 2 (two) times daily.  60 tablet  1  . esomeprazole (NEXIUM) 40 MG capsule Take 40 mg by mouth daily before breakfast.      . furosemide (LASIX) 40 MG tablet Take 40 mg by mouth daily.        Marland Kitchen HYDROcodone-acetaminophen (NORCO) 5-325 MG per tablet Take 1 tablet by mouth at bedtime. For pain      . isosorbide-hydrALAZINE (BIDIL) 20-37.5 MG per tablet Take 0.5-1 tablets by mouth 3 (three) times daily.       . Multiple Vitamins-Minerals (MULTIVITAMINS THER. W/MINERALS) TABS Take 0.5 tablets by mouth 2 (two) times daily.       . nebivolol (BYSTOLIC) 10 MG tablet Take 5 mg by mouth 2 (two) times daily.       . potassium chloride SA (K-DUR,KLOR-CON) 20 MEQ tablet Take 1 tablet (20 mEq total) by mouth 2 (two) times daily.  60 tablet  1  Assessment: 61 yo male admitted with shortness of breath and leg edema.  He has a history of cardiomyopathy and was seen in the cath lab today for further work up.  He now has an IABP and is receiving IV heparin. Heparin level (<0.1) is below-goal on 600 units/hr. RN reports some oozing from catheter site.   Goal of Therapy:  Heparin level 0.2-0.5 IU/ml Monitor platelets by anticoagulation protocol: Yes   Plan: 1. Increase IV heparin to 700 units/hr.  2. Heparin level in 6 hours.   Lorre Munroe, PharmD 09/11/2011,2:36 AM

## 2011-09-11 NOTE — Progress Notes (Signed)
ANTICOAGULATION CONSULT NOTE - Follow UP  Pharmacy Consult for Heparin Indication:  IABP  Patient Measurements: Height: 5\' 9"  (175.3 cm) Weight: 137 lb 2 oz (62.2 kg) IBW/kg (Calculated) : 70.7  Heparin Dosing Weight: 63  Vital Signs: Temp: 97 F (36.1 C) (07/26 1600) Temp src: Core (Comment) (07/26 0700) BP: 118/81 mmHg (07/26 1500) Pulse Rate: 59  (07/26 1600)  Labs:  Alvira Philips 09/11/11 1715 09/11/11 0915 09/11/11 0141 09/10/11 2015 09/10/11 0838 09/10/11 0511 09/09/11 0252 09/08/11 1936  HGB -- -- 11.6* 12.2* -- -- -- --  HCT -- -- 35.4* 36.8* -- 37.3* -- --  PLT -- -- 470* 505* -- 476* -- --  APTT -- -- -- -- -- -- -- --  LABPROT -- -- -- -- -- -- 15.9* --  INR -- -- -- -- -- -- 1.24 --  HEPARINUNFRC <0.10* <0.10* <0.10* -- -- -- -- --  CREATININE -- -- 1.57* -- 1.75* -- 2.18* --  CKTOTAL -- -- -- -- -- -- -- 65  CKMB -- -- -- -- -- -- -- 3.5  TROPONINI -- -- -- -- -- -- -- <0.30   Estimated Creatinine Clearance: 43.5 ml/min (by C-G formula based on Cr of 1.57).   Assessment: 61 yo male admitted with shortness of breath and leg edema.  He has a history of cardiomyopathy and was seen in the cath lab for further work up.  He now has an IABP.    Patient continues to have undetectable level on heparin. He is no longer having oozing from line site after injection of epi into site this am.   He has had labile heparin levels over the last 4 days on rates as high at 1200 units/hr his heparin level was undetectable, but measurable at 600 units/hr. Per nurse heparin is infusing without problem, and labs are being drawn appropriately.  H/H declining slightly but not outside what is to be expected on an IABP.    Goal of Therapy:  Heparin level 0.2-0.5 IU/ml Monitor platelets by anticoagulation protocol: Yes   Plan:   Increase heparin to 1050 units/hr, no bolus  Obtain a heparin level in 8 h  Daily CBC and heparin therapy   Thank you for allowing pharmacy to be a part of  this patients care team.  Lovenia Kim Pharm.D., BCPS Clinical Pharmacist 09/11/2011 6:09 PM Pager: [phone] Phone: [phone]

## 2011-09-12 ENCOUNTER — Inpatient Hospital Stay (HOSPITAL_COMMUNITY): Payer: Medicare Other

## 2011-09-12 DIAGNOSIS — N182 Chronic kidney disease, stage 2 (mild): Secondary | ICD-10-CM

## 2011-09-12 LAB — BASIC METABOLIC PANEL
CO2: 29 mEq/L (ref 19–32)
Calcium: 9.3 mg/dL (ref 8.4–10.5)
Chloride: 97 mEq/L (ref 96–112)
Glucose, Bld: 114 mg/dL — ABNORMAL HIGH (ref 70–99)
Potassium: 3.8 mEq/L (ref 3.5–5.1)
Sodium: 132 mEq/L — ABNORMAL LOW (ref 135–145)

## 2011-09-12 LAB — CARBOXYHEMOGLOBIN
Carboxyhemoglobin: 1.4 % (ref 0.5–1.5)
Methemoglobin: 1 % (ref 0.0–1.5)
O2 Saturation: 64.3 %
Total hemoglobin: 12.6 g/dL — ABNORMAL LOW (ref 13.5–18.0)

## 2011-09-12 LAB — MAGNESIUM: Magnesium: 1.8 mg/dL (ref 1.5–2.5)

## 2011-09-12 LAB — CBC
Hemoglobin: 13.2 g/dL (ref 13.0–17.0)
Platelets: 449 10*3/uL — ABNORMAL HIGH (ref 150–400)
RBC: 3.93 MIL/uL — ABNORMAL LOW (ref 4.22–5.81)
WBC: 8.2 10*3/uL (ref 4.0–10.5)

## 2011-09-12 LAB — HEPARIN LEVEL (UNFRACTIONATED): Heparin Unfractionated: <0.1 IU/mL — ABNORMAL LOW (ref 0.30–0.70)

## 2011-09-12 MED ORDER — MAGNESIUM SULFATE 40 MG/ML IJ SOLN
2.0000 g | Freq: Once | INTRAMUSCULAR | Status: AC
  Administered 2011-09-12: 2 g via INTRAVENOUS
  Filled 2011-09-12: qty 50

## 2011-09-12 MED ORDER — FUROSEMIDE 80 MG PO TABS
80.0000 mg | ORAL_TABLET | Freq: Two times a day (BID) | ORAL | Status: DC
  Administered 2011-09-12 (×2): 80 mg via ORAL
  Filled 2011-09-12 (×5): qty 1

## 2011-09-12 MED ORDER — HYDRALAZINE HCL 25 MG PO TABS
25.0000 mg | ORAL_TABLET | Freq: Three times a day (TID) | ORAL | Status: DC
  Administered 2011-09-12 – 2011-09-13 (×4): 25 mg via ORAL
  Filled 2011-09-12 (×7): qty 1

## 2011-09-12 MED ORDER — SPIRONOLACTONE 25 MG PO TABS
25.0000 mg | ORAL_TABLET | Freq: Every day | ORAL | Status: DC
  Administered 2011-09-12 – 2011-09-16 (×5): 25 mg via ORAL
  Filled 2011-09-12 (×6): qty 1

## 2011-09-12 MED ORDER — DOCUSATE SODIUM 100 MG PO CAPS
100.0000 mg | ORAL_CAPSULE | Freq: Two times a day (BID) | ORAL | Status: DC | PRN
  Filled 2011-09-12: qty 1

## 2011-09-12 NOTE — Progress Notes (Addendum)
ANTICOAGULATION CONSULT NOTE - Follow UP  Pharmacy Consult for Heparin Indication:  IABP  Patient Measurements: Height: 6' (182.9 cm) Weight: 138 lb 0.1 oz (62.6 kg) IBW/kg (Calculated) : 77.6  Heparin Dosing Weight: 63  Vital Signs: Temp: 97.3 F (36.3 C) (07/27 1000) Temp src: Core (Comment) (07/27 0800) BP: 120/89 mmHg (07/27 1200) Pulse Rate: 100  (07/27 1000)  Labs:  Basename 09/12/11 1130 09/12/11 0144 09/11/11 1715 09/11/11 0141 09/10/11 2015 09/10/11 0838  HGB -- 13.2 -- 11.6* -- --  HCT -- 38.1* -- 35.4* 36.8* --  PLT -- 449* -- 470* 505* --  APTT -- -- -- -- -- --  LABPROT -- -- -- -- -- --  INR -- -- -- -- -- --  HEPARINUNFRC 0.46 <0.10* <0.10* -- -- --  CREATININE -- 1.56* -- 1.57* -- 1.75*  CKTOTAL -- -- -- -- -- --  CKMB -- -- -- -- -- --  TROPONINI -- -- -- -- -- --   Estimated Creatinine Clearance: 44 ml/min (by C-G formula based on Cr of 1.56).   Assessment: 61 yo male admitted with shortness of breath and leg edema.  He has a history of cardiomyopathy and was seen in the cath lab for further work up.  He now has an IABP.    He has had labile heparin levels over the last 4 days on rates as high at 1200 units/hr his heparin level was undetectable, but measurable at 600 units/hr. Per nurse heparin is infusing without problem, and labs are being drawn appropriately.  Previous oozing has now resolved.  H/H actually increased today.  Platelet count elevated, stable.  Heparin level is currently in goal range (0.46) on 1300 units/hr.  Goal of Therapy:  Heparin level 0.2-0.5 IU/ml Monitor platelets by anticoagulation protocol: Yes   Plan:   Continue IV heparin at current rate.  Recheck heparin level in 6 hrs.  Continue daily CBC and heparin level.   Thank you for allowing pharmacy to be a part of this patients care team.  Reece Leader, Pharm D 09/12/2011 12:52 PM

## 2011-09-12 NOTE — Progress Notes (Signed)
Subjective:  No new compalints. Feeling better. 3.6 Litre diuresis with IABP. Somewhat tachycardic.  Objective:  Vital Signs in the last 24 hours: Temp:  [96.8 F (36 C)-97.9 F (36.6 C)] 97 F (36.1 C) (07/27 0800) Pulse Rate:  [57-101] 100  (07/27 0800) Cardiac Rhythm:  [-] Normal sinus rhythm;Sinus tachycardia (07/27 0800) Resp:  [13-19] 18  (07/27 0800) BP: (97-145)/(60-103) 109/68 mmHg (07/27 0800) SpO2:  [96 %-100 %] 98 % (07/27 0800) Weight:  [62.6 kg (138 lb 0.1 oz)] 62.6 kg (138 lb 0.1 oz) (07/27 0500)  Physical Exam: BP Readings from Last 1 Encounters:  09/12/11 109/68    Wt Readings from Last 1 Encounters:  09/12/11 62.6 kg (138 lb 0.1 oz)    Weight change: -0.4 kg (-14.1 oz)  HEENT: [place]/AT, Eyes-Brown, PERL, EOMI, Conjunctiva-Pink, Sclera-Non-icteric Neck: No JVD, No bruit, Trachea midline. Lungs:  Clear, Bilateral. Cardiac:  Regular rhythm, normal S1 and S2, no S3. II/VI systolic murmur Abdomen:  Soft, non-tender. Extremities:  No edema present. No cyanosis. No clubbing. CNS: AxOx3, Cranial nerves grossly intact, moves all 4 extremities. Right handed. Skin: Warm and dry.   Intake/Output from previous day: 07/26 0701 - 07/27 0700 In: 2425.6 [P.O.:580; I.V.:1771.6; IV Piggyback:74] Out: 6250 [Urine:6250]    Lab Results: BMET    Component Value Date/Time   NA 132* 09/12/2011 0144   K 3.8 09/12/2011 0144   CL 97 09/12/2011 0144   CO2 29 09/12/2011 0144   GLUCOSE 114* 09/12/2011 0144   BUN 28* 09/12/2011 0144   CREATININE 1.56* 09/12/2011 0144   CALCIUM 9.3 09/12/2011 0144   GFRNONAA 46* 09/12/2011 0144   GFRAA 54* 09/12/2011 0144   CBC    Component Value Date/Time   WBC 8.2 09/12/2011 0144   RBC 3.93* 09/12/2011 0144   HGB 13.2 09/12/2011 0144   HCT 38.1* 09/12/2011 0144   PLT 449* 09/12/2011 0144   MCV 96.9 09/12/2011 0144   MCH 33.6 09/12/2011 0144   MCHC 34.6 09/12/2011 0144   RDW 13.7 09/12/2011 0144   LYMPHSABS 1.2 11/10/2010 1019   MONOABS 0.7 11/10/2010  1019   EOSABS 0.3 11/10/2010 1019   BASOSABS 0.1 11/10/2010 1019   CARDIAC ENZYMES Lab Results  Component Value Date   CKTOTAL 65 09/08/2011   CKMB 3.5 09/08/2011   TROPONINI <0.30 09/08/2011    Assessment/Plan:  Patient Active Hospital Problem List: Cardiogenic shock (09/10/2011) Assessment: Stable Plan: per HF team. Cardiorenal syndrome (09/10/2011) Assessment: Stable with improving Creatinine  Pulmonary hypertension  Paroxysmal atrial tacycardia Brachial paresis (09/10/2011) Assessment: Improving Chest pain probably secondary to GERD  MI ruled out  Acute on chronic systolic heart failure-POA and Principle diagnosis  Dilated and non ischemic cardiomyopathy  CKD, stage II  GERD  Hypertension  COPD    LOS: 4 days    [name]  MD  09/12/2011, 8:22 AM

## 2011-09-12 NOTE — Progress Notes (Addendum)
Advanced Heart Failure Rounding Note  Referring Physician: Dr. Sherryl Manges  Primary Physician: Dr. [name]  Primary Cardiologist: Dr. [name]   Weight range: 140-144 lbs. 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 He has ruled out MI with normal cardiac enzymes. Last cardiac cath in 01/2011 showed normal coronary arteries. He also reported DOE, orthopnea and PND. He was recently hospitalized in June and July this year for acute decompensated CHF ( a total 6 admission since Feb, 2012). 2 D echo in 08/2011: LVEF 25-30% diffuse hypokinesis, severe Russell, severe TR, RV moderately to severely reduced. PA 57.  He has history of medical noncompliance but reports compliance with his medications and low sodium diet currently. He is currently taking Lasix, Bystolic and Bidil.   Patient continues to have NYHA IV symptoms. He underwent RHC yesterday with the following findings: RA = 17  RV = 87/14/18  PA = 87/56 (69)  PCW = 31  Fick cardiac output/index = 2.1/1.2  PVR = 12.6 Woods  FA sat = 98%  PA sat = 28%, 28  Patient was transferred to ICU with support of IABP and Milrinone after his RHC.   Much better today. Brisk diuresis. Wedge down to 18. Breathing better. Less pain in neck and groin. Groin site no longer oozing after being injected. Very brief run of junctional tachycardia last night. Now sinus 50-60s.  Swan #s today:  RA 8 PA 62/27 (39) PCW 18 CO/CI 3.8/1.8 SVR 1400   Objective:   Weight  Range:  Vital Signs:   Temp:  [96.8 F (36 C)-97.9 F (36.6 C)] 96.8 F (36 C) (07/27 0700) Pulse Rate:  [57-101] 59  (07/27 0700) Resp:  [13-19] 15  (07/27 0700) BP: (97-146)/(60-103) 137/72 mmHg (07/27 0700) SpO2:  [96 %-100 %] 99 % (07/27 0700) Weight:  [62.6 kg (138 lb 0.1 oz)] 62.6 kg (138 lb 0.1 oz) (07/27 0500) Last BM Date: 09/08/11  Weight change: Filed Weights   09/10/11 2000 09/11/11 0500 09/12/11 0500  Weight: 63 kg (138 lb 14.2 oz) 62.2 kg (137 lb 2 oz) 62.6 kg (138 lb 0.1 oz)    Intake/Output:   Intake/Output Summary (Last 24 hours) at 09/12/11 0731 Last data filed at 09/12/11 0721  Gross per 24 hour  Intake 2425.58 ml  Output   6425 ml  Net -3999.42 ml     Physical Exam:  General: Improved  NAD  Neck: supple. Swan in place. Carotids 2+ bilat; no bruits. No lymphadenopathy or thryomegaly appreciated.  Cor: PMI lateral displaced. Regular 3/6 systolic Russell and 2/6 systolic TR. +S3.  Lungs:Clear anteriorly Abdomen: soft, nontender No hepatosplenomegaly. No bruits or masses. Good bowel sounds.+ pulsatile liver  Extremities: no cyanosis, clubbing, rash, edema groin site ok Neuro: alert & orientedx3, cranial nerves grossly intact. moves all 4 extremities w/o difficulty. Affect pleasant  Telemetry:SR very brief run junctional.reciprocating tachycardia.  Labs: Basic Metabolic Panel:  Lab 09/12/11 1610 09/11/11 0141 09/10/11 0838 09/09/11 0252 09/08/11 0244  NA 132* 138 138 138 139  K 3.8  3.9 3.5 4.0 5.1  CL 97 101 102 101 105  CO2 29 27 22 20 21   GLUCOSE 114* 97 127* 134* 107*  BUN 28* 30* 33* 40* 39*  CREATININE 1.56* 1.57* 1.75* 2.18* 2.03*  CALCIUM 9.3 9.1 9.2 -- --  MG 1.8 1.6 -- -- --  PHOS -- -- -- -- --    Liver Function Tests:  Lab 09/08/11 0341  AST 37  ALT 26  ALKPHOS 108  BILITOT 1.9*  PROT 6.9  ALBUMIN 3.0*    Lab 09/08/11 0341  LIPASE 36  AMYLASE --    CBC:  Lab 09/12/11 0144 09/11/11 0141 09/10/11 2015 09/10/11 0511  09/09/11 0252  WBC 8.2 6.4 6.6 7.0 7.9  NEUTROABS -- -- -- -- --  HGB 13.2 11.6* 12.2* 12.1* 12.7*  HCT 38.1* 35.4* 36.8* 37.3* 37.9*  MCV 96.9 99.4 100.0 100.5* 99.0  PLT 449* 470* 505* 476* 516*    Cardiac Enzymes:  Lab 09/08/11 1936 09/08/11 1355 09/08/11 0824  CKTOTAL 65 67 77  CKMB 3.5 3.8 4.2*  CKMBINDEX -- -- --  TROPONINI <0.30 <0.30 <0.30    BNP: BNP (last 3 results)  Basename 09/08/11 0244 08/30/11 0520 08/23/11 0354  PROBNP 19404.0* 7192.0* 18397.0*     Other results:  EKG:   Imaging: No results found.   Medications:     Scheduled Medications:    . aspirin      . aspirin EC  81 mg Oral Daily  . furosemide  80 mg Intravenous Q8H  . hydrALAZINE  12.5 mg Oral TID AC  . isosorbide mononitrate  30 mg Oral Daily  . magnesium sulfate 1 - 4 g bolus IVPB  2 g Intravenous Once  . metolazone  2.5 mg Oral Once  . oxyCODONE  5 mg Oral QHS  . pantoprazole  40 mg Oral Daily  . potassium chloride  20 mEq Oral BID  . spironolactone  12.5 mg Oral Daily  . Thrombi-Pad  1 each Topical Once  . DISCONTD: clopidogrel  75 mg Oral Q breakfast  . DISCONTD: furosemide  80 mg Intravenous BID  . DISCONTD: HYDROcodone-acetaminophen  1 tablet Oral QHS  . DISCONTD: Thrombin-CMC-CaCl  1 patch Apply externally Once    Infusions:    . amiodarone (NEXTERONE PREMIX) 360 mg/200 mL dextrose 0.5 mg/min (09/12/11 0514)  . heparin 1,300 Units/hr (09/12/11 0515)  . milrinone 0.375 mcg/kg/min (09/12/11 0517)  . DISCONTD: heparin 700 Units/hr (09/11/11 0300)    PRN Medications: sodium chloride, sodium chloride, acetaminophen, acetaminophen, albuterol, calcium carbonate, HYDROmorphone (DILAUDID) injection, nitroGLYCERIN, ondansetron (ZOFRAN) IV, oxyCODONE-acetaminophen, DISCONTD: fentaNYL, DISCONTD: HYDROcodone-acetaminophen, DISCONTD: ondansetron (ZOFRAN) IV   Assessment:  1. Cardiogenic shock with elevated filling pressure 2. A/C biventricular heart failure with low output and  cardiogenic shock (primary diagnosis) 3. 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 severe Russell and TR,  5.Severe pulmonary hypertension.  6. Cardiorenal syndrome  7. A/C renal failure, likely multifactorial including cardiorenal syndrome  8. Junctional/retrograde tachycardia  9. Hypertension  10. hypomagnesemia 11. Ongoing Tobacco abuse  12. Remote h/o cocaine and Marijuana abuse   Plan/Discussion:    Hemodynamics much improved with IABP, milrinone and restoration of sinus rhythm. I think maintaining SR will be critically important to our ability to be able to wean IABP and hopefully also let his LV and RV recover some of their function.   Swan numbers much improved. PA pressures remain out of proportion to PCWP. Will change diuretics to po. Continue amio, milrinone and IABP  today. Supp K and mag. Titrate hydral/NTG  Will try to wean IABP tomorrow or Monday.    The patient is critically ill with multiple organ systems failure and requires high complexity decision making for assessment and support, frequent evaluation and titration of therapies, application of advanced monitoring technologies and extensive interpretation of multiple databases.   Critical Care Time devoted to patient care services described in this note is 35 Minutes.  [name] 7:31 AM

## 2011-09-12 NOTE — Progress Notes (Signed)
Pharmacist Heart Failure Core Measure Documentation  Assessment: [name] has an EF documented as 25-30% on 08/24/11 by ECHO.  Rationale: Heart failure patients with left ventricular systolic dysfunction (LVSD) and an EF < 40% should be prescribed an angiotensin converting enzyme inhibitor (ACEI) or angiotensin receptor blocker (ARB) at discharge unless a contraindication is documented in the medical record.  This patient is not currently on an ACEI or ARB for HF.  This note is being placed in the record in order to provide documentation that a contraindication to the use of these agents is present for this encounter.  ACE Inhibitor or Angiotensin Receptor Blocker is contraindicated (specify all that apply)  []   ACEI allergy AND ARB allergy []   Angioedema []   Moderate or severe aortic stenosis []   Hyperkalemia []   Hypotension []   Renal artery stenosis [x]   Worsening renal function, preexisting renal disease or dysfunction   Gardner Candle 09/12/2011 11:18 AM

## 2011-09-12 NOTE — Progress Notes (Signed)
Fick Cardiac output= 3.8  125x1.8/[id number])    x10

## 2011-09-12 NOTE — Progress Notes (Signed)
ANTICOAGULATION CONSULT NOTE - Follow UP  Pharmacy Consult for Heparin Indication:  IABP  Patient Measurements: Height: 5\' 9"  (175.3 cm) Weight: 137 lb 2 oz (62.2 kg) IBW/kg (Calculated) : 70.7  Heparin Dosing Weight: 63  Vital Signs: Temp: 97.9 F (36.6 C) (07/27 0400) BP: 117/82 mmHg (07/27 0400) Pulse Rate: 97  (07/27 0400)  Labs:  Basename 09/12/11 0144 09/11/11 1715 09/11/11 0915 09/11/11 0141 09/10/11 2015 09/10/11 0838  HGB 13.2 -- -- 11.6* -- --  HCT 38.1* -- -- 35.4* 36.8* --  PLT 449* -- -- 470* 505* --  APTT -- -- -- -- -- --  LABPROT -- -- -- -- -- --  INR -- -- -- -- -- --  HEPARINUNFRC <0.10* <0.10* <0.10* -- -- --  CREATININE 1.56* -- -- 1.57* -- 1.75*  CKTOTAL -- -- -- -- -- --  CKMB -- -- -- -- -- --  TROPONINI -- -- -- -- -- --   Estimated Creatinine Clearance: 43.7 ml/min (by C-G formula based on Cr of 1.56).   Assessment: 61 yo with CHF, on IABP, for Heparin  Goal of Therapy:  Heparin level 0.2-0.5 IU/ml Monitor platelets by anticoagulation protocol: Yes   Plan:  Increase Heparin 1300 units/hr Check heparin level in 8 hours.  Geannie Risen, PharmD, BCPS

## 2011-09-12 NOTE — Progress Notes (Signed)
ANTICOAGULATION CONSULT NOTE - Follow UP  Pharmacy Consult for Heparin Indication:  IABP  Patient Measurements: Height: 6' (182.9 cm) Weight: 138 lb 0.1 oz (62.6 kg) IBW/kg (Calculated) : 77.6  Heparin Dosing Weight: 63  Vital Signs: Temp: 97.2 F (36.2 C) (07/27 1900) Temp src: Core (Comment) (07/27 0800) BP: 124/74 mmHg (07/27 1930) Pulse Rate: 96  (07/27 1900)  Labs:  Basename 09/12/11 1800 09/12/11 1130 09/12/11 0144 09/11/11 0141 09/10/11 2015 09/10/11 0838  HGB -- -- 13.2 11.6* -- --  HCT -- -- 38.1* 35.4* 36.8* --  PLT -- -- 449* 470* 505* --  APTT -- -- -- -- -- --  LABPROT -- -- -- -- -- --  INR -- -- -- -- -- --  HEPARINUNFRC 0.46 0.46 <0.10* -- -- --  CREATININE -- -- 1.56* 1.57* -- 1.75*  CKTOTAL -- -- -- -- -- --  CKMB -- -- -- -- -- --  TROPONINI -- -- -- -- -- --   Estimated Creatinine Clearance: 44 ml/min (by C-G formula based on Cr of 1.56).   Assessment: 61 yo male admitted with shortness of breath and leg edema.  He has a history of cardiomyopathy and was seen in the cath lab for further work up.  He now has an IABP.    He has had labile heparin levels over the last 4 days on rates as high at 1200 units/hr his heparin level was undetectable, but measurable at 600 units/hr. Per nurse heparin is infusing without problem, and labs are being drawn appropriately.  Previous oozing has now resolved.  H/H actually increased today.  Platelet count elevated, stable.  Heparin level remains in goal range (0.46) on 1300 units/hr.  Goal of Therapy:  Heparin level 0.2-0.5 IU/ml Monitor platelets by anticoagulation protocol: Yes   Plan:   Continue IV heparin at current rate 1300 uts/hr  Continue daily CBC and heparin level.   Colinda Barth S. Merilynn Finland, PharmD, [facility] Clinical Staff Pharmacist Pager [phone]   09/12/2011 7:38 PM

## 2011-09-13 ENCOUNTER — Inpatient Hospital Stay (HOSPITAL_COMMUNITY): Payer: Medicare Other

## 2011-09-13 LAB — CARBOXYHEMOGLOBIN
Carboxyhemoglobin: 1.7 % — ABNORMAL HIGH (ref 0.5–1.5)
Carboxyhemoglobin: 1.8 % — ABNORMAL HIGH (ref 0.5–1.5)
Methemoglobin: 0.8 % (ref 0.0–1.5)
O2 Saturation: 95.5 %
O2 Saturation: 69.6 %
O2 Saturation: 99.3 %
O2 Saturation: 62.7 %
Total hemoglobin: 13.5 g/dL (ref 13.5–18.0)
Total hemoglobin: 13.7 g/dL (ref 13.5–18.0)

## 2011-09-13 LAB — BASIC METABOLIC PANEL
Calcium: 9.6 mg/dL (ref 8.4–10.5)
Creatinine, Ser: 1.81 mg/dL — ABNORMAL HIGH (ref 0.50–1.35)
GFR calc Af Amer: 45 mL/min — ABNORMAL LOW (ref >90)

## 2011-09-13 LAB — CBC
MCH: 32.4 pg (ref 26.0–34.0)
MCHC: 33.2 g/dL (ref 30.0–36.0)
MCV: 97.6 fL (ref 78.0–100.0)
Platelets: 404 10*3/uL — ABNORMAL HIGH (ref 150–400)
RDW: 13.6 % (ref 11.5–15.5)

## 2011-09-13 LAB — MAGNESIUM: Magnesium: 1.8 mg/dL (ref 1.5–2.5)

## 2011-09-13 LAB — POCT ACTIVATED CLOTTING TIME: Activated Clotting Time: 164 seconds

## 2011-09-13 MED ORDER — HYDRALAZINE HCL 25 MG PO TABS
37.5000 mg | ORAL_TABLET | Freq: Three times a day (TID) | ORAL | Status: DC
  Administered 2011-09-13 – 2011-09-15 (×8): 37.5 mg via ORAL
  Filled 2011-09-13 (×12): qty 1.5

## 2011-09-13 MED ORDER — HEPARIN SODIUM (PORCINE) 5000 UNIT/ML IJ SOLN
5000.0000 [IU] | Freq: Three times a day (TID) | INTRAMUSCULAR | Status: DC
  Administered 2011-09-13 – 2011-09-22 (×25): 5000 [IU] via SUBCUTANEOUS
  Filled 2011-09-13 (×29): qty 1

## 2011-09-13 NOTE — Progress Notes (Signed)
Advanced Heart Failure Rounding Note  Referring Physician: Dr. Sherryl Manges  Primary Physician: Dr. [name]  Primary Cardiologist: Dr. [name]   Weight range: 140-144 lbs. 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 He has ruled out MI with normal cardiac enzymes. Last cardiac cath in 01/2011 showed normal coronary arteries. He was recently hospitalized in June and July this year for acute decompensated CHF ( a total 6 admission since Feb, 2012). 2 D echo in 08/2011: LVEF 25-30% diffuse hypokinesis, severe Russell, severe TR, RV moderately to severely reduced. PA 57.    Patient continues to have NYHA IV symptoms. He underwent RHC yesterday with the following findings: RA = 17  RV = 87/14/18  PA = 87/56 (69)  PCW = 31  Fick cardiac output/index = 2.1/1.2  PVR = 12.6 [place]  FA sat = 98%  PA sat = 28%, 28  Patient was transferred to ICU with support of IABP and Milrinone after his RHC.   Continues to feel better. Brisk diuresis again. Wedge down to 9. Breathing better. Less pain in neck and groin. Co-ox coming back at 99. I repeated and it is 69%.  Reviewed tele: Spent most of the day in junctional tachycardia yesterday with rates 95-100. Now sinus 50-60s.  Swan #s today:  RA 2 PA 61/19 (33) PCW 9 CO/CI 3.4/1.9 SVR 1772 PVR 5.6  Objective:   Weight Range:  Vital Signs:   Temp:  [96.4 F (35.8 C)-97.3 F (36.3 C)] 97 F (36.1 C) (07/28 0700) Pulse Rate:  [57-118] 59  (07/28 0700) Resp:  [8-22]  11  (07/28 0700) BP: (104-139)/(73-101) 134/76 mmHg (07/28 0700) SpO2:  [93 %-100 %] 99 % (07/28 0700) Weight:  [60.4 kg (133 lb 2.5 oz)] 60.4 kg (133 lb 2.5 oz) (07/28 0700) Last BM Date: 09/08/11  Weight change: Filed Weights   09/11/11 0500 09/12/11 0500 09/13/11 0700  Weight: 62.2 kg (137 lb 2 oz) 62.6 kg (138 lb 0.1 oz) 60.4 kg (133 lb 2.5 oz)    Intake/Output:   Intake/Output Summary (Last 24 hours) at 09/13/11 0805 Last data filed at 09/13/11 0700  Gross per 24 hour  Intake 1838.7 ml  Output   3500 ml  Net -1661.3 ml     Physical Exam:  General: Improved  NAD  Neck: supple. Swan in place. Carotids 2+ bilat; no bruits. No lymphadenopathy or thryomegaly appreciated.  Cor: PMI lateral displaced. Regular 3/6 systolic Russell and 2/6 systolic TR. +S3.  Lungs:Clear anteriorly Abdomen: soft, nontender No hepatosplenomegaly. No bruits or masses. Good bowel sounds.+ pulsatile liver  Extremities: no cyanosis, clubbing, rash, edema groin site ok Neuro: alert & orientedx3, cranial nerves grossly intact. moves all 4 extremities w/o difficulty. Affect pleasant  Telemetry:SR very brief run junctional.reciprocating tachycardia.  Labs: Basic Metabolic Panel:  Lab 09/13/11 4696 09/12/11 0144 09/11/11 0141 09/10/11 0838 09/09/11 0252  NA 132* 132* 138 138 138  K 3.8 3.8 3.9 3.5 4.0  CL 91* 97 101 102 101  CO2 29 29 27 22  20  GLUCOSE 157* 114* 97 127* 134*  BUN 32* 28* 30* 33* 40*  CREATININE 1.81* 1.56* 1.57* 1.75* 2.18*  CALCIUM 9.6 9.3 9.1 -- --  MG 1.8 1.8 1.6 -- --  PHOS -- -- -- -- --    Liver Function Tests:  Lab 09/08/11 0341  AST 37  ALT 26  ALKPHOS 108  BILITOT 1.9*  PROT 6.9  ALBUMIN 3.0*    Lab 09/08/11 0341  LIPASE 36  AMYLASE --    CBC:  Lab 09/13/11 0445 09/12/11 0144 09/11/11 0141 09/10/11 2015 09/10/11 0511  WBC 6.5 8.2 6.4 6.6 7.0  NEUTROABS -- -- -- -- --  HGB 13.3 13.2 11.6* 12.2* 12.1*  HCT 40.1 38.1* 35.4* 36.8* 37.3*  MCV 97.6 96.9 99.4  100.0 100.5*  PLT 404* 449* 470* 505* 476*    Cardiac Enzymes:  Lab 09/08/11 1936 09/08/11 1355 09/08/11 0824  CKTOTAL 65 67 77  CKMB 3.5 3.8 4.2*  CKMBINDEX -- -- --  TROPONINI <0.30 <0.30 <0.30    BNP: BNP (last 3 results)  Basename 09/08/11 0244 08/30/11 0520 08/23/11 0354  PROBNP 19404.0* 7192.0* 18397.0*     Other results: Imaging: Dg Chest Port 1 View  09/13/2011  *RADIOLOGY REPORT*  Clinical Data: Cardiogenic shock and cardiorenal syndrome.  Swan- Ganz catheter and intra-aortic balloon pump.  PORTABLE CHEST - 1 VIEW  Comparison: 09/12/2011  Findings: Cardiomegaly is noted. A right IJ Swan-Ganz catheter is noted with tip overlying the right intralobar pulmonary artery. An intraaortic catheter tip is identified in the proximal descending aorta. There is no evidence of pneumothorax, pleural effusion or airspace disease. There has been little interval change since last study.  IMPRESSION: Cardiomegaly with unchanged support apparatus as described.  No evidence of pneumothorax.  Original Report Authenticated By: Rosendo Gros, M.D.   Dg Chest Port 1 View  09/12/2011  *RADIOLOGY REPORT*  Clinical Data: Cardiogenic shock.  Cardiorenal syndrome.  Aortic balloon pump and Swan-Ganz catheter placement.  PORTABLE CHEST - 1 VIEW  Comparison: 09/08/2011  Findings: An intra-aortic balloon pump is now seen with tip in the proximal descending thoracic aorta.  A new right jugular Swan-Ganz catheter is seen with tip overlying the right intralobar pulmonary artery.  No pneumothorax identified.  Moderate cardiomegaly stable.  Both lungs remain clear.  No evidence of pulmonary edema, infiltrate, or definite pleural effusion.  IMPRESSION:  1.  New Swan-Ganz catheter and intra-aortic balloon pump in appropriate position.  No evidence of pneumothorax. 2.  Stable cardiomegaly.  No active lung disease.  Original Report Authenticated By: Danae Orleans, M.D.     Medications:     Scheduled Medications:      . aspirin EC  81 mg Oral Daily  . furosemide  80 mg Oral BID  . hydrALAZINE  25 mg Oral Q8H  . isosorbide mononitrate  30 mg Oral Daily  . magnesium sulfate 1 - 4 g bolus IVPB  2 g Intravenous Once  . oxyCODONE  5 mg Oral QHS  . pantoprazole  40 mg Oral Daily  . potassium chloride  20 mEq Oral BID  . spironolactone  25 mg Oral Daily  . Thrombi-Pad  1 each Topical Once    Infusions:    . amiodarone (NEXTERONE PREMIX) 360 mg/200 mL dextrose 0.501 mg/min (09/12/11 2300)  . heparin 1,300 Units/hr (09/12/11 2300)  . milrinone 0.377 mcg/kg/min (09/12/11 2300)    PRN Medications: sodium chloride, sodium chloride, acetaminophen, acetaminophen, albuterol, calcium carbonate, docusate sodium, HYDROmorphone (DILAUDID) injection,  nitroGLYCERIN, ondansetron (ZOFRAN) IV, oxyCODONE-acetaminophen   Assessment:  1. Cardiogenic shock with elevated filling pressure 2. A/C biventricular heart failure with low output and cardiogenic shock (primary diagnosis) 3. 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 severe Russell and TR,  5.Severe pulmonary hypertension.  6. Cardiorenal syndrome  7. A/C renal failure, likely multifactorial including cardiorenal syndrome  8. Junctional/retrograde tachycardia  9. Hypertension  10. hypomagnesemia 11. Ongoing Tobacco abuse  12. Remote h/o cocaine and Marijuana abuse   Plan/Discussion:    Hemodynamics stable. CVP and wedge low and CR up slightly. Will stop lasix for now. Have repositioned swan and redrew co-ox. Suspect we can pull IABP today. Continue milrinone. Titrate hydral NTG as tolerated.    He was in his tachycardia most of the day yesterday but now back in sinus. I think maintaining SR will be critically important to our ability to be able to wean IABP and hopefully also let his LV and RV recover some of their function. Will continue amiodarone. May need attempt at ablation prior to hospital d/c.  The patient is critically ill with multiple organ systems failure  and requires high complexity decision making for assessment and support, frequent evaluation and titration of therapies, application of advanced monitoring technologies and extensive interpretation of multiple databases.   Critical Care Time devoted to patient care services described in this note is 35 Minutes.  Russell Forgione,MD 8:05 AM

## 2011-09-13 NOTE — Progress Notes (Signed)
Subjective:  Feeling same. Afebrile. Sinus rhythm. Now with prolonged QT to 600 ms.  Objective:  Vital Signs in the last 24 hours: Temp:  [96.4 F (35.8 C)-97.3 F (36.3 C)] 97 F (36.1 C) (07/28 0800) Pulse Rate:  [57-118] 60  (07/28 0800) Cardiac Rhythm:  [-] Normal sinus rhythm (07/28 0800) Resp:  [8-22] 12  (07/28 0800) BP: (104-139)/(73-101) 127/75 mmHg (07/28 0800) SpO2:  [93 %-100 %] 100 % (07/28 0800) Weight:  [60.4 kg (133 lb 2.5 oz)] 60.4 kg (133 lb 2.5 oz) (07/28 0700)  Physical Exam: BP Readings from Last 1 Encounters:  09/13/11 127/75    Wt Readings from Last 1 Encounters:  09/13/11 60.4 kg (133 lb 2.5 oz)    Weight change: -2.2 kg (-4 lb 13.6 oz)  HEENT: [place]/AT, Eyes-Brown, PERL, EOMI, Conjunctiva-Pink, Sclera-Non-icteric Neck: No JVD, No bruit, Trachea midline. Lungs:  Clear, Bilateral. Cardiac:  Regular rhythm, normal S1 and S2, no S3.  Abdomen:  Soft, non-tender. Extremities:  No edema present. No cyanosis. No clubbing. CNS: AxOx3, Cranial nerves grossly intact, moves all 4 extremities. Right handed. Skin: Warm and dry.   Intake/Output from previous day: 07/27 0701 - 07/28 0700 In: 1895.6 [P.O.:480; I.V.:1365.6; IV Piggyback:50] Out: 3875 [Urine:3875]    Lab Results: BMET    Component Value Date/Time   NA 132* 09/13/2011 0445   K 3.8 09/13/2011 0445   CL 91* 09/13/2011 0445   CO2 29 09/13/2011 0445   GLUCOSE 157* 09/13/2011 0445   BUN 32* 09/13/2011 0445   CREATININE 1.81* 09/13/2011 0445   CALCIUM 9.6 09/13/2011 0445   GFRNONAA 39* 09/13/2011 0445   GFRAA 45* 09/13/2011 0445   CBC    Component Value Date/Time   WBC 6.5 09/13/2011 0445   RBC 4.11* 09/13/2011 0445   HGB 13.3 09/13/2011 0445   HCT 40.1 09/13/2011 0445   PLT 404* 09/13/2011 0445   MCV 97.6 09/13/2011 0445   MCH 32.4 09/13/2011 0445   MCHC 33.2 09/13/2011 0445   RDW 13.6 09/13/2011 0445   LYMPHSABS 1.2 11/10/2010 1019   MONOABS 0.7 11/10/2010 1019   EOSABS 0.3 11/10/2010 1019   BASOSABS 0.1  11/10/2010 1019   CARDIAC ENZYMES Lab Results  Component Value Date   CKTOTAL 65 09/08/2011   CKMB 3.5 09/08/2011   TROPONINI <0.30 09/08/2011    Assessment/Plan:  Patient Active Hospital Problem List: Cardiogenic shock (09/10/2011) Assessment: Stable Plan: per HF team. Cardiorenal syndrome (09/10/2011) Early dehydration  Pulmonary hypertension  Paroxysmal atrial tacycardia Brachial paresis (09/10/2011) Assessment: Improving  Chest pain probably secondary to GERD  MI ruled out  Acute on chronic systolic right and left heart failure-POA and Principle diagnosis  Dilated and non ischemic cardiomyopathy  CKD, stage II  GERD  Hypertension  COPD     LOS: 5 days    [name]  MD  09/13/2011, 9:07 AM

## 2011-09-13 NOTE — Progress Notes (Addendum)
ANTICOAGULATION CONSULT NOTE - Follow UP  Pharmacy Consult for Heparin Indication:  IABP  Patient Measurements: Height: 6' (182.9 cm) Weight: 133 lb 2.5 oz (60.4 kg) IBW/kg (Calculated) : 77.6  Heparin Dosing Weight: 63  Vital Signs: Temp: 97 F (36.1 C) (07/28 0700) BP: 134/76 mmHg (07/28 0700) Pulse Rate: 59  (07/28 0700)  Labs:  Basename 09/13/11 0445 09/12/11 1800 09/12/11 1130 09/12/11 0144 09/11/11 0141  HGB 13.3 -- -- 13.2 --  HCT 40.1 -- -- 38.1* 35.4*  PLT 404* -- -- 449* 470*  APTT -- -- -- -- --  LABPROT -- -- -- -- --  INR -- -- -- -- --  HEPARINUNFRC 0.49 0.46 0.46 -- --  CREATININE 1.81* -- -- 1.56* 1.57*  CKTOTAL -- -- -- -- --  CKMB -- -- -- -- --  TROPONINI -- -- -- -- --   Estimated Creatinine Clearance: 36.6 ml/min (by C-G formula based on Cr of 1.81).   Assessment: 61 yo male admitted with shortness of breath and leg edema.  He has a history of cardiomyopathy and was seen in the cath lab for further work up.  He now has an IABP.    Heparin level is currently in goal range (0.46) on 1300 units/hr.  Platelet count elevated but trending down, likely related to IABP.  Hgb/Hct stable.  No bleeding noted currently.  Goal of Therapy:  Heparin level 0.2-0.5 IU/ml Monitor platelets by anticoagulation protocol: Yes   Plan:   Continue IV heparin at current rate.  Continue daily CBC and heparin level.  F/U plans to d/c IABP.   Thank you for allowing pharmacy to be a part of this patients care team.  Reece Leader, Pharm D 09/13/2011 7:49 AM

## 2011-09-13 NOTE — Progress Notes (Signed)
Cardiac output/cardiac index values (2.18/1.12)are markedly decreased from 6 hours ago. Pt's clinical condition rermains unchanged. Vital signs are within range. Will continue to monitor

## 2011-09-13 NOTE — Progress Notes (Signed)
MAP 80 after 30 minutes of 1:3 IABP. IABP removed after confirmation of SVO2 > 60 and ACT < 170.  Balloon intact upon removal; manual pressure held x 30 minutes.  No blood loss, no hematoma, R foot warm, pink, with pedal pulse palpable.  Pt. Tolerated procedure well.

## 2011-09-13 NOTE — Progress Notes (Signed)
There is a marked decrease in patient's CO/CI=2.12/1.18 from six hours ago.Patient is in a junctional rythm but clinically patient has not change. Patient has normal vital signs on a balloon pump and milrinone drip. Pt denies shortness of breath and orthopnea.Will do a hemodynamic check in the am.

## 2011-09-13 NOTE — Progress Notes (Signed)
On call MD, Allena Katz, informed about inconsistencies in patients cardiac output/index and venous oxygen saturation values. Portable xray ordered to check for catheter migration. Patient is sleeping comfortably.

## 2011-09-14 ENCOUNTER — Encounter (HOSPITAL_COMMUNITY)
Admission: AD | Disposition: A | Discharge status: Home / Self Care | Payer: Self-pay | Source: Home / Self Care | Attending: Cardiovascular Disease

## 2011-09-14 DIAGNOSIS — I471 Supraventricular tachycardia: Secondary | ICD-10-CM

## 2011-09-14 HISTORY — PX: SUPRAVENTRICULAR TACHYCARDIA ABLATION: SHX5492

## 2011-09-14 LAB — BASIC METABOLIC PANEL
CO2: 29 mEq/L (ref 19–32)
Calcium: 9.4 mg/dL (ref 8.4–10.5)
Chloride: 92 mEq/L — ABNORMAL LOW (ref 96–112)
Glucose, Bld: 98 mg/dL (ref 70–99)
Potassium: 4.1 mEq/L (ref 3.5–5.1)
Sodium: 129 mEq/L — ABNORMAL LOW (ref 135–145)

## 2011-09-14 LAB — CBC
Hemoglobin: 13.9 g/dL (ref 13.0–17.0)
MCH: 33.7 pg (ref 26.0–34.0)
RBC: 4.12 MIL/uL — ABNORMAL LOW (ref 4.22–5.81)
WBC: 7 10*3/uL (ref 4.0–10.5)

## 2011-09-14 LAB — CARBOXYHEMOGLOBIN
Carboxyhemoglobin: 2.1 % — ABNORMAL HIGH (ref 0.5–1.5)
Methemoglobin: 1 % (ref 0.0–1.5)
O2 Saturation: 95.7 %
Total hemoglobin: 13.2 g/dL — ABNORMAL LOW (ref 13.5–18.0)

## 2011-09-14 SURGERY — SUPRAVENTRICULAR TACHYCARDIA ABLATION
Anesthesia: LOCAL

## 2011-09-14 MED ORDER — FENTANYL CITRATE 0.05 MG/ML IJ SOLN
INTRAMUSCULAR | Status: AC
  Filled 2011-09-14: qty 2

## 2011-09-14 MED ORDER — SODIUM CHLORIDE 0.9 % IJ SOLN
3.0000 mL | Freq: Two times a day (BID) | INTRAMUSCULAR | Status: DC
  Administered 2011-09-14 – 2011-09-22 (×15): 3 mL via INTRAVENOUS

## 2011-09-14 MED ORDER — AMIODARONE HCL IN DEXTROSE 360-4.14 MG/200ML-% IV SOLN
0.5000 mg/min | INTRAVENOUS | Status: DC
  Filled 2011-09-14: qty 200

## 2011-09-14 MED ORDER — OXYCODONE-ACETAMINOPHEN 5-325 MG PO TABS
1.0000 | ORAL_TABLET | Freq: Four times a day (QID) | ORAL | Status: DC | PRN
  Administered 2011-09-14 – 2011-09-15 (×2): 1 via ORAL
  Filled 2011-09-14 (×2): qty 1

## 2011-09-14 MED ORDER — ACETAMINOPHEN 325 MG PO TABS: 650.0000 mg | ORAL_TABLET | ORAL | Status: DC | PRN

## 2011-09-14 MED ORDER — SODIUM CHLORIDE 0.9 % IV SOLN: 250.0000 mL | INTRAVENOUS | Status: DC | PRN

## 2011-09-14 MED ORDER — MAGNESIUM SULFATE 40 MG/ML IJ SOLN
2.0000 g | Freq: Once | INTRAMUSCULAR | Status: AC
  Administered 2011-09-14: 2 g via INTRAVENOUS
  Filled 2011-09-14: qty 50

## 2011-09-14 MED ORDER — ONDANSETRON HCL 4 MG/2ML IJ SOLN: 4.0000 mg | Freq: Four times a day (QID) | INTRAMUSCULAR | Status: DC | PRN

## 2011-09-14 MED ORDER — MIDAZOLAM HCL 5 MG/5ML IJ SOLN
INTRAMUSCULAR | Status: AC
  Filled 2011-09-14: qty 5

## 2011-09-14 MED ORDER — AMIODARONE HCL IN DEXTROSE 360-4.14 MG/200ML-% IV SOLN
1.0000 mg/min | INTRAVENOUS | Status: DC
  Administered 2011-09-14: 1 mg/min via INTRAVENOUS
  Filled 2011-09-14: qty 200

## 2011-09-14 MED ORDER — SODIUM CHLORIDE 0.9 % IJ SOLN: 3.0000 mL | INTRAMUSCULAR | Status: DC | PRN

## 2011-09-14 MED ORDER — BUPIVACAINE HCL (PF) 0.25 % IJ SOLN
INTRAMUSCULAR | Status: AC
  Filled 2011-09-14: qty 60

## 2011-09-14 MED ORDER — AMIODARONE LOAD VIA INFUSION
150.0000 mg | Freq: Once | INTRAVENOUS | Status: AC
  Administered 2011-09-14: 150 mg via INTRAVENOUS
  Filled 2011-09-14: qty 83.34

## 2011-09-14 NOTE — Progress Notes (Signed)
Subjective:  Feeling better. Sat up in chair. Decreasing shortness of breath.  Objective:  Vital Signs in the last 24 hours: Temp:  [97 F (36.1 C)-98.2 F (36.8 C)] 98.2 F (36.8 C) (07/29 0600) Pulse Rate:  [53-111] 111  (07/29 0600) Cardiac Rhythm:  [-] Normal sinus rhythm (07/29 0000) Resp:  [14-22] 21  (07/29 0600) BP: (104-134)/(41-88) 116/84 mmHg (07/29 0600) SpO2:  [95 %-100 %] 98 % (07/29 0600) Weight:  [59.6 kg (131 lb 6.3 oz)] 59.6 kg (131 lb 6.3 oz) (07/29 0600)  Physical Exam: BP Readings from Last 1 Encounters:  09/14/11 116/84    Wt Readings from Last 1 Encounters:  09/14/11 59.6 kg (131 lb 6.3 oz)    Weight change: -0.8 kg (-1 lb 12.2 oz)  HEENT: [place]/AT, Eyes-Brown, PERL, EOMI, Conjunctiva-Pink, Sclera-Non-icteric Neck: No JVD, No bruit, Trachea midline. Lungs:  Clear, Bilateral. Cardiac:  Regular rhythm, normal S1 and S2, no S3.  Abdomen:  Soft, non-tender. Extremities:  No edema present. No cyanosis. No clubbing. CNS: AxOx3, Cranial nerves grossly intact, moves all 4 extremities. Right handed. Skin: Warm and dry.   Intake/Output from previous day: 07/28 0701 - 07/29 0700 In: 515 [I.V.:515] Out: 2800 [Urine:2800]    Lab Results: BMET    Component Value Date/Time   NA 129* 09/14/2011 0257   K 4.1 09/14/2011 0257   CL 92* 09/14/2011 0257   CO2 29 09/14/2011 0257   GLUCOSE 98 09/14/2011 0257   BUN 29* 09/14/2011 0257   CREATININE 1.36* 09/14/2011 0257   CALCIUM 9.4 09/14/2011 0257   GFRNONAA 55* 09/14/2011 0257   GFRAA 63* 09/14/2011 0257   CBC    Component Value Date/Time   WBC 7.0 09/14/2011 0257   RBC 4.12* 09/14/2011 0257   HGB 13.9 09/14/2011 0257   HCT 38.9* 09/14/2011 0257   PLT 339 09/14/2011 0257   MCV 94.4 09/14/2011 0257   MCH 33.7 09/14/2011 0257   MCHC 35.7 09/14/2011 0257   RDW 13.3 09/14/2011 0257   LYMPHSABS 1.2 11/10/2010 1019   MONOABS 0.7 11/10/2010 1019   EOSABS 0.3 11/10/2010 1019   BASOSABS 0.1 11/10/2010 1019   CARDIAC ENZYMES Lab  Results  Component Value Date   CKTOTAL 65 09/08/2011   CKMB 3.5 09/08/2011   TROPONINI <0.30 09/08/2011    Assessment/Plan:  Patient Active Hospital Problem List: Cardiogenic shock (09/10/2011) Assessment: Stable Plan: per HF team. Cardiorenal syndrome (09/10/2011)  Pulmonary hypertension  Paroxysmal atrial/junctional tacycardia Brachial paresis (09/10/2011) Assessment: Improving  Chest pain probably secondary to GERD  MI ruled out  Acute on chronic systolic right and left heart failure-POA and Principle diagnosis  Dilated and non ischemic cardiomyopathy  CKD, stage II  GERD  Hypertension  COPD  Follow with HF team. EP consult   LOS: 6 days    [name]  MD  09/14/2011, 8:54 AM

## 2011-09-14 NOTE — Progress Notes (Signed)
Patient ID: [name], male   DOB: 08-03-1950, 61 y.o.   MRN: [id number] Subjective:  No chest pain or sob. He has had recurrent SVT.   Objective:  Vital Signs in the last 24 hours: Temp:  [97.2 F (36.2 C)-98.4 F (36.9 C)] 97.2 F (36.2 C) (07/29 1400) Pulse Rate:  [53-118] 69  (07/29 1400) Resp:  [14-22] 14  (07/29 1400) BP: (104-134)/(41-92) 116/68 mmHg (07/29 1400) SpO2:  [95 %-99 %] 98 % (07/29 1400) Weight:  [131 lb 6.3 oz (59.6 kg)] 131 lb 6.3 oz (59.6 kg) (07/29 0600)  Intake/Output from previous day: 07/28 0701 - 07/29 0700 In: 515 [I.V.:515] Out: 2800 [Urine:2800] Intake/Output from this shift: Total I/O In: 359.3 [P.O.:240; I.V.:119.3] Out: 700 [Urine:700]  Physical Exam: ill appearing NAD HEENT: Unremarkable Neck:  8 cm JVD, no thyromegally Lungs:  Clear with no wheezes HEART:  Regular rate rhythm, no murmurs, no rubs, no clicks, soft S3 Abd:  Flat, positive bowel sounds, no organomegally, no rebound, no guarding Ext:  2 plus pulses, no edema, no cyanosis, no clubbing Skin:  No rashes no nodules Neuro:  CN II through XII intact, motor grossly intact  Lab Results:  Basename 09/14/11 0257 09/13/11 0445  WBC 7.0 6.5  HGB 13.9 13.3  PLT 339 404*    Basename 09/14/11 0257 09/13/11 0445  NA 129* 132*  K 4.1 3.8  CL 92* 91*  CO2 29 29  GLUCOSE 98 157*  BUN 29* 32*  CREATININE 1.36* 1.81*   No results found for this basename: TROPONINI:2,CK,MB:2 in the last 72 hours Hepatic Function Panel No results found for this basename: PROT,ALBUMIN,AST,ALT,ALKPHOS,BILITOT,BILIDIR,IBILI in the last 72 hours No results found for this basename: CHOL in the last 72 hours No results found for this basename: PROTIME in the last 72 hours  Imaging: Dg Chest Port 1 View  09/13/2011  *RADIOLOGY REPORT*  Clinical Data: Cardiogenic shock and cardiorenal syndrome.  Swan- Ganz catheter and intra-aortic balloon pump.  PORTABLE CHEST - 1 VIEW  Comparison: 09/12/2011  Findings:  Cardiomegaly is noted. A right IJ Swan-Ganz catheter is noted with tip overlying the right intralobar pulmonary artery. An intraaortic catheter tip is identified in the proximal descending aorta. There is no evidence of pneumothorax, pleural effusion or airspace disease. There has been little interval change since last study.  IMPRESSION: Cardiomegaly with unchanged support apparatus as described.  No evidence of pneumothorax.  Original Report Authenticated By: Rosendo Gros, M.D.    Cardiac Studies: Tele - NSR, SVT Assessment/Plan:  1. Incessant SVT - I have discussed the risks/benefits/goals/expectations of the procedure with the patient and his family and he wishes to proceed.  LOS: 6 days    Lewayne Bunting, M.D. 09/14/2011, 2:34 PM

## 2011-09-14 NOTE — Op Note (Signed)
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 [id number].

## 2011-09-14 NOTE — Progress Notes (Signed)
Ward Givens PAc notified of firmness at right groin site. No bruit auscultated. Area tender per pt, no active bleeding. Ward Givens in to assess site. P.Carlee Tesfaye RN

## 2011-09-14 NOTE — H&P (View-Only) (Signed)
Patient ID: [name], male   DOB: 01/04/1951, 61 y.o.   MRN: [id number] Subjective:  No chest pain or sob. He has had recurrent SVT.   Objective:  Vital Signs in the last 24 hours: Temp:  [97.2 F (36.2 C)-98.4 F (36.9 C)] 97.2 F (36.2 C) (07/29 1400) Pulse Rate:  [53-118] 69  (07/29 1400) Resp:  [14-22] 14  (07/29 1400) BP: (104-134)/(41-92) 116/68 mmHg (07/29 1400) SpO2:  [95 %-99 %] 98 % (07/29 1400) Weight:  [131 lb 6.3 oz (59.6 kg)] 131 lb 6.3 oz (59.6 kg) (07/29 0600)  Intake/Output from previous day: 07/28 0701 - 07/29 0700 In: 515 [I.V.:515] Out: 2800 [Urine:2800] Intake/Output from this shift: Total I/O In: 359.3 [P.O.:240; I.V.:119.3] Out: 700 [Urine:700]  Physical Exam: ill appearing NAD HEENT: Unremarkable Neck:  8 cm JVD, no thyromegally Lungs:  Clear with no wheezes HEART:  Regular rate rhythm, no murmurs, no rubs, no clicks, soft S3 Abd:  Flat, positive bowel sounds, no organomegally, no rebound, no guarding Ext:  2 plus pulses, no edema, no cyanosis, no clubbing Skin:  No rashes no nodules Neuro:  CN II through XII intact, motor grossly intact  Lab Results:  Basename 09/14/11 0257 09/13/11 0445  WBC 7.0 6.5  HGB 13.9 13.3  PLT 339 404*    Basename 09/14/11 0257 09/13/11 0445  NA 129* 132*  K 4.1 3.8  CL 92* 91*  CO2 29 29  GLUCOSE 98 157*  BUN 29* 32*  CREATININE 1.36* 1.81*   No results found for this basename: TROPONINI:2,CK,MB:2 in the last 72 hours Hepatic Function Panel No results found for this basename: PROT,ALBUMIN,AST,ALT,ALKPHOS,BILITOT,BILIDIR,IBILI in the last 72 hours No results found for this basename: CHOL in the last 72 hours No results found for this basename: PROTIME in the last 72 hours  Imaging: Dg Chest Port 1 View  09/13/2011  *RADIOLOGY REPORT*  Clinical Data: Cardiogenic shock and cardiorenal syndrome.  Swan- Ganz catheter and intra-aortic balloon pump.  PORTABLE CHEST - 1 VIEW  Comparison: 09/12/2011  Findings:  Cardiomegaly is noted. A right IJ Swan-Ganz catheter is noted with tip overlying the right intralobar pulmonary artery. An intraaortic catheter tip is identified in the proximal descending aorta. There is no evidence of pneumothorax, pleural effusion or airspace disease. There has been little interval change since last study.  IMPRESSION: Cardiomegaly with unchanged support apparatus as described.  No evidence of pneumothorax.  Original Report Authenticated By: JEFFREY T. HU, M.D.    Cardiac Studies: Tele - NSR, SVT Assessment/Plan:  1. Incessant SVT - I have discussed the risks/benefits/goals/expectations of the procedure with the patient and his family and he wishes to proceed.  LOS: 6 days    Leiyah Maultsby, M.D. 09/14/2011, 2:34 PM     

## 2011-09-14 NOTE — Progress Notes (Addendum)
Advanced Heart Failure Rounding Note  Referring Physician: Dr. Sherryl Manges  Primary Physician: Dr. [name]  Primary Cardiologist: Dr. [name]   Weight range: 140-144 lbs. 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 He has ruled out MI with normal cardiac enzymes. Last cardiac cath in 01/2011 showed normal coronary arteries. He was recently hospitalized in June and July this year for acute decompensated CHF ( a total 6 admission since Feb, 2012). 2 D echo in 08/2011: LVEF 25-30% diffuse hypokinesis, severe MR, severe TR, RV moderately to severely reduced. PA 57.    Patient continues to have NYHA IV symptoms. He underwent RHC yesterday with the following findings: RA = 17  RV = 87/14/18  PA = 87/56 (69)  PCW = 31  Fick cardiac output/index = 2.1/1.2  PVR = 12.6 Woods  FA sat = 98%  PA sat = 28%, 28  Patient was transferred to ICU with support of IABP and Milrinone after his RHC.   Yesterday IABP removed.  Remains on Milrinone 0.375. 24 hour I/O= 2.2 liters. Weight down 9 pounds since admit.  Co-ox  56%. Amiodarone stopped due to long QT.  Reviewed tele: Episode of accelerated [place] last night . This am back in his tachycardia with HR 120  Feels better. Breathing well. No orthopnea or PND.  Swan #s today: CVP 3 PA 50/26 34 PCW 5 CO/CI 3.2/1.8  Objective:   Weight Range:  Vital Signs:   Temp:  [97 F (36.1 C)-98.2 F (36.8 C)] 98.2 F (36.8 C) (07/29 0600) Pulse Rate:  [53-111] 111   (07/29 0600) Resp:  [11-22] 21  (07/29 0600) BP: (104-134)/(41-88) 116/84 mmHg (07/29 0600) SpO2:  [95 %-100 %] 98 % (07/29 0600) Weight:  [59.6 kg (131 lb 6.3 oz)] 59.6 kg (131 lb 6.3 oz) (07/29 0600) Last BM Date: 09/13/11  Weight change: Filed Weights   09/12/11 0500 09/13/11 0700 09/14/11 0600  Weight: 62.6 kg (138 lb 0.1 oz) 60.4 kg (133 lb 2.5 oz) 59.6 kg (131 lb 6.3 oz)    Intake/Output:   Intake/Output Summary (Last 24 hours) at 09/14/11 0729 Last data filed at 09/14/11 0600  Gross per 24 hour  Intake    515 ml  Output   2800 ml  Net  -2285 ml     Physical Exam:  General: Improved  NAD  Neck: supple. Swan in place. Carotids 2+ bilat; no bruits. No lymphadenopathy or thryomegaly appreciated.  Cor: PMI lateral displaced. Regular tachy  3/6 systolic MR and 2/6 systolic TR. +S3.  Lungs:Clear anteriorly Abdomen: soft, nontender No hepatosplenomegaly. No bruits or masses. Good bowel sounds. Extremities: no cyanosis, clubbing, rash, edema groin site ok (small hematoma) Neuro: alert & orientedx3, cranial nerves grossly intact. moves all 4 extremities w/o difficulty. Affect pleasant  Telemetry: Junctional Tachycardia in 120s  Labs: Basic Metabolic Panel:  Lab 09/14/11 0981 09/13/11 0445 09/12/11 0144 09/11/11 0141 09/10/11 0838  NA 129* 132* 132* 138 138  K 4.1 3.8 3.8 3.9 3.5  CL 92* 91* 97 101  102  CO2 29 29 29 27 22   GLUCOSE 98 157* 114* 97 127*  BUN 29* 32* 28* 30* 33*  CREATININE 1.36* 1.81* 1.56* 1.57* 1.75*  CALCIUM 9.4 9.6 9.3 -- --  MG 1.7 1.8 1.8 1.6 --  PHOS -- -- -- -- --    Liver Function Tests:  Lab 09/08/11 0341  AST 37  ALT 26  ALKPHOS 108  BILITOT 1.9*  PROT 6.9  ALBUMIN 3.0*    Lab 09/08/11 0341  LIPASE 36  AMYLASE --    CBC:  Lab 09/14/11 0257 09/13/11 0445 09/12/11 0144 09/11/11 0141 09/10/11 2015  WBC 7.0 6.5 8.2 6.4 6.6  NEUTROABS -- -- -- -- --  HGB 13.9 13.3 13.2 11.6* 12.2*  HCT 38.9* 40.1 38.1* 35.4* 36.8*  MCV 94.4  97.6 96.9 99.4 100.0  PLT 339 404* 449* 470* 505*    Cardiac Enzymes:  Lab 09/08/11 1936 09/08/11 1355 09/08/11 0824  CKTOTAL 65 67 77  CKMB 3.5 3.8 4.2*  CKMBINDEX -- -- --  TROPONINI <0.30 <0.30 <0.30    BNP: BNP (last 3 results)  Basename 09/08/11 0244 08/30/11 0520 08/23/11 0354  PROBNP 19404.0* 7192.0* 18397.0*     Other results: Imaging: Dg Chest Port 1 View  09/13/2011  *RADIOLOGY REPORT*  Clinical Data: Cardiogenic shock and cardiorenal syndrome.  Swan- Ganz catheter and intra-aortic balloon pump.  PORTABLE CHEST - 1 VIEW  Comparison: 09/12/2011  Findings: Cardiomegaly is noted. A right IJ Swan-Ganz catheter is noted with tip overlying the right intralobar pulmonary artery. An intraaortic catheter tip is identified in the proximal descending aorta. There is no evidence of pneumothorax, pleural effusion or airspace disease. There has been little interval change since last study.  IMPRESSION: Cardiomegaly with unchanged support apparatus as described.  No evidence of pneumothorax.  Original Report Authenticated By: Rosendo Gros, M.D.   Dg Chest Port 1 View  09/12/2011  *RADIOLOGY REPORT*  Clinical Data: Cardiogenic shock.  Cardiorenal syndrome.  Aortic balloon pump and Swan-Ganz catheter placement.  PORTABLE CHEST - 1 VIEW  Comparison: 09/08/2011  Findings: An intra-aortic balloon pump is now seen with tip in the proximal descending thoracic aorta.  A new right jugular Swan-Ganz catheter is seen with tip overlying the right intralobar pulmonary artery.  No pneumothorax identified.  Moderate cardiomegaly stable.  Both lungs remain clear.  No evidence of pulmonary edema, infiltrate, or definite pleural effusion.  IMPRESSION:  1.  New Swan-Ganz catheter and intra-aortic balloon pump in appropriate position.  No evidence of pneumothorax. 2.  Stable cardiomegaly.  No active lung disease.  Original Report Authenticated By: Danae Orleans, M.D.     Medications:     Scheduled  Medications:    . aspirin EC  81 mg Oral Daily  . heparin  5,000 Units Subcutaneous Q8H  . hydrALAZINE  37.5 mg Oral Q8H  . isosorbide mononitrate  30 mg Oral Daily  . oxyCODONE  5 mg Oral QHS  . pantoprazole  40 mg Oral Daily  . spironolactone  25 mg Oral Daily  . Thrombi-Pad  1 each Topical Once  . DISCONTD: furosemide  80 mg Oral BID  . DISCONTD: hydrALAZINE  25 mg Oral Q8H  . DISCONTD: potassium chloride  20 mEq Oral BID    Infusions:    . milrinone 0.375 mcg/kg/min (09/13/11 0916)  . DISCONTD: amiodarone (NEXTERONE PREMIX) 360 mg/200 mL dextrose 0.501 mg/min (09/12/11 2300)  . DISCONTD: heparin 1,300 Units/hr (09/12/11 2300)    PRN Medications:  sodium chloride, sodium chloride, acetaminophen, acetaminophen, albuterol, calcium carbonate, docusate sodium, HYDROmorphone (DILAUDID) injection, nitroGLYCERIN, ondansetron (ZOFRAN) IV, oxyCODONE-acetaminophen   Assessment:  1. Cardiogenic shock with elevated filling pressure 2. A/C biventricular heart failure with low output and cardiogenic shock (primary diagnosis) 3. 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 severe MR and TR,  5.Severe pulmonary hypertension.  6. Cardiorenal syndrome  7. A/C renal failure, likely multifactorial including cardiorenal syndrome  8. Junctional/retrograde tachycardia  9. Hypertension  10. Hypomagnesemia/hyponatremia 11. Ongoing Tobacco abuse  12. Remote h/o cocaine and Marijuana abuse   Plan/Discussion:   Volume status improving.   Restart amiodarone drip. Give Magnesium bolus.   OOB to chair today.   CLEGG,AMY,NP-C 7:29 AM  Patient seen and examined with Tonye Becket, NP. We discussed all aspects of the encounter. I agree with the assessment and plan as stated above.   Improved but remains tenuous. Volume status low. Will hold diuretics. It is critical for him to maintain SR to permit LV/RV recovery. Would resume amiodarone (despite long QT) and we can follow closely in ICU. Will d/w EP regarding  possibility of attempted ablation later this week.   Keep swan in for now. OOB to chair. Supp mag. Would not start b-blocker yet. Continue to titrate hydral/NTG as tolerated.  The patient is critically ill with multiple organ systems failure and requires high complexity decision making for assessment and support, frequent evaluation and titration of therapies, application of advanced monitoring technologies and extensive interpretation of multiple databases.   Critical Care Time devoted to patient care services described in this note is 35 Minutes.  Zahli Vetsch,MD 7:58 AM

## 2011-09-14 NOTE — Interval H&P Note (Signed)
History and Physical Interval Note:  09/14/2011 2:47 PM  [name]  has presented today for surgery, with the diagnosis of svt  The various methods of treatment have been discussed with the patient and family. After consideration of risks, benefits and other options for treatment, the patient has consented to  Procedure(s) (LRB): SUPRAVENTRICULAR TACHYCARDIA ABLATION (N/A) as a surgical intervention .  The patient's history has been reviewed, patient examined, no change in status, stable for surgery.  I have reviewed the patient's chart and labs.  Questions were answered to the patient's satisfaction.     Lewayne Bunting

## 2011-09-15 LAB — CARBOXYHEMOGLOBIN
Carboxyhemoglobin: 1.8 % — ABNORMAL HIGH (ref 0.5–1.5)
O2 Saturation: 66.8 %

## 2011-09-15 LAB — BASIC METABOLIC PANEL
CO2: 26 mEq/L (ref 19–32)
GFR calc non Af Amer: 52 mL/min — ABNORMAL LOW (ref >90)
Glucose, Bld: 118 mg/dL — ABNORMAL HIGH (ref 70–99)
Potassium: 4.7 mEq/L (ref 3.5–5.1)
Sodium: 130 mEq/L — ABNORMAL LOW (ref 135–145)

## 2011-09-15 MED ORDER — CALCIUM CARBONATE ANTACID 500 MG PO CHEW
2.0000 | CHEWABLE_TABLET | Freq: Four times a day (QID) | ORAL | Status: DC | PRN
  Filled 2011-09-15: qty 2

## 2011-09-15 MED ORDER — NITROGLYCERIN 0.4 MG SL SUBL: 0.4000 mg | SUBLINGUAL_TABLET | SUBLINGUAL | Status: DC | PRN

## 2011-09-15 MED ORDER — CARVEDILOL 3.125 MG PO TABS
3.1250 mg | ORAL_TABLET | Freq: Two times a day (BID) | ORAL | Status: DC
  Administered 2011-09-15 – 2011-09-17 (×4): 3.125 mg via ORAL
  Filled 2011-09-15 (×6): qty 1

## 2011-09-15 MED ORDER — DIGOXIN 125 MCG PO TABS
0.1250 mg | ORAL_TABLET | Freq: Every day | ORAL | Status: DC
  Administered 2011-09-15 – 2011-09-22 (×8): 0.125 mg via ORAL
  Filled 2011-09-15 (×8): qty 1

## 2011-09-15 MED ORDER — OXYCODONE-ACETAMINOPHEN 5-325 MG PO TABS
2.0000 | ORAL_TABLET | Freq: Four times a day (QID) | ORAL | Status: DC | PRN
  Administered 2011-09-15 – 2011-09-21 (×10): 2 via ORAL
  Filled 2011-09-15 (×11): qty 2

## 2011-09-15 MED ORDER — PANTOPRAZOLE SODIUM 40 MG PO TBEC
40.0000 mg | DELAYED_RELEASE_TABLET | Freq: Every day | ORAL | Status: DC
  Administered 2011-09-15 – 2011-09-22 (×8): 40 mg via ORAL
  Filled 2011-09-15 (×6): qty 1

## 2011-09-15 NOTE — Op Note (Signed)
[name], [name]                 ACCOUNT NO.:  [id number]  MEDICAL RECORD NO.:  [id number]  LOCATION:  2902                         FACILITY:  [facility]  PHYSICIAN:  Doylene Canning. Ladona Ridgel, MD    DATE OF BIRTH:  1950-07-08  DATE OF PROCEDURE:  09/14/2011 DATE OF DISCHARGE:                              OPERATIVE REPORT   PROCEDURE PERFORMED:  Electrophysiologic study and radiofrequency catheter ablation of AV node reentrant tachycardia.  INTRODUCTION:  The patient is a 61 year old man with incessant SVT and severe left ventricular dysfunction.  It is thought that his SVT is contributing to his left ventricular dysfunction, though not certain that is it the sole cause.  He has had recurrent SVT, which has been incessant despite medical therapy with an amiodarone.  He is now referred for catheter ablation.  PROCEDURE:  After informed consent was obtained, the patient was taken to the Diagnostic EP Lab in the fasting state.  After usual preparation and draping, intravenous fentanyl and midazolam was given for sedation. A 6-French quadripolar catheter was inserted percutaneously on the left femoral vein and advanced to the right ventricle.  A 6-French quadripolar catheter was inserted percutaneously in the left femoral vein and advanced to the His bundle region.  A 6-French octapolar catheter was inserted percutaneously into the left femoral vein and advanced to the coronary sinus.  Catheter insertion resulted in the development of the SVT.  Mapping was carried out demonstrating a short RP tachycardia, though it was a little longer than expected.  The activation sequence appeared to be earliest between the CS56 and His atrial electrogram.  The patient underwent ventricular pacing at a cycle length of 600 milliseconds and this demonstrated initiation of tachycardia with straight ventricular pacing at a cycle length of 560 milliseconds.  During tachycardia, PVCs were replaced at the time of  His bundle refractoriness, which did not pre-excite the atrium.  In addition, ventricular pacing was carried out during tachycardia resulting in a VA-AV conduction sequence and persistence of the tachycardia.  This maneuver was carried out on multiple occasions. Mapping during tachycardia demonstrated the earliest atrial activation again between the CS56 and the His bundle region.  At this point, with no pre-excitation clearly demonstrated, a V-A-V conduction sequence during ventricular pacing, a diagnosis of AV node reentrant tachycardia was made the caveat being that the [place] interval was somewhat longer than would be expected.  A 7-French quadripolar ablation catheter was then maneuvered by way of the right femoral vein into the right atrium and mapping of Koch's triangle was carried out.  This was performed during tachycardia.  This demonstrated the earliest atrial activation to be in the location of Koch's triangle between sites 4 and sites 6.  The tachycardia was terminated with rapid atrial pacing at a cycle length of 320 milliseconds.  At this point, a total of 3 RF energy applications were subsequently delivered to sites 6 through 8 in Koch's triangle. During RF energy application, there was accelerated junctional rhythm. Occasionally, there was retrograde block in the atrium and RF was discontinued and the catheter quickly re-maneuvered.  After the RF energy had been applied, attempts to reinduced the tachycardia were  carried out.  Rapid atrial and ventricular pacing as well as programmed atrial and ventricular stimulation were carried out over the next 45 minutes.  There was no inducible SVT, no inducible VT, and no evidence of residual slow pathway conduction.  The PR interval was now less than the RR interval.  At this point, the catheters were removed, hemostasis was assured, and the patient was returned to his room in satisfactory condition.  COMPLICATIONS:  There were no  immediate procedure complications.  RESULTS:  A.  Baseline ECG.  Baseline ECG demonstrates sinus rhythm with normal axis and intervals. B.  Baseline intervals:  The sinus node cycle length was 853 milliseconds, the QRS duration 120 milliseconds, HV interval was 46 milliseconds, AH interval was 90 milliseconds. C.  Rapid ventricular pacing:  Following ablation, rapid ventricular pacing demonstrated VA Wenckebach at 580 milliseconds.  Prior to ablation, rapid ventricular pacing resulted in the induction of SVT. D.  Programmed ventricular stimulation:  Programmed ventricular stimulation was carried out from the right ventricle at a base drive cycle length of 161 milliseconds.  This demonstrated VA dissociation. E.  Rapid atrial pacing:  Rapid atrial pacing was carried out from the right atrium in the coronary sinus at a base drive cycle length of 096 milliseconds and was stepwise decreased down to 400 milliseconds where AV Wenckebach was observed.  During rapid atrial pacing following ablation, the PR interval was less than the RR interval and there was no inducible SVT. F.  Programmed atrial stimulation:  Programmed atrial stimulation was carried out from the right atrium at a base drive [id number] length of 811 milliseconds.  The S1-S2 interval was stepwise decreased down to 270 milliseconds where atrial refractoriness was observed.  During programmed atrial stimulation, there were no AH jumps, no echo beats following catheter ablation. E.  Arrhythmias observed: 1. AV node reentrant tachycardia initiation was with rapid ventricular     pacing as well as spontaneous.  The duration was sustained.  The     cycle length was 550 milliseconds.     a.     Mapping:  Mapping of Koch's triangle demonstrated normal      size and orientation.     b.     RF energy application:  Total of 3 RF energy applications      were delivered to sites 6 through 8 in Koch's triangle resulted in      accelerated  junctional rhythm and rendering of the tachycardia      noninducible.  CONCLUSION:  This study demonstrates successful electrophysiologic study and RF catheter ablation of AV node reentrant tachycardia with a total of 3 RF energy applications delivered to sites 6 through 8 in Koch's triangle rendering the patient's incessant SVT noninducible.     Doylene Canning. Ladona Ridgel, MD     GWT/MEDQ  D:  09/14/2011  T:  09/15/2011  Job:  [id number]  cc:   Ricki Rodriguez, M.D. Bevelyn Buckles. Bensimhon, MD

## 2011-09-15 NOTE — Progress Notes (Signed)
Advanced Heart Failure Rounding Note  Referring Physician: Dr. Sherryl Manges  Primary Physician: Dr. [name]  Primary Cardiologist: Dr. [name]   Weight range: 140-144 lbs. 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 He has ruled out MI with normal cardiac enzymes. Last cardiac cath in 01/2011 showed normal coronary arteries. He was recently hospitalized in June and July this year for acute decompensated CHF ( a total 6 admission since Feb, 2012). 2 D echo in 08/2011: LVEF 25-30% diffuse hypokinesis, severe Russell, severe TR, RV moderately to severely reduced. PA 57.    Patient continues to have NYHA IV symptoms. He underwent RHC yesterday with the following findings: RA = 17  RV = 87/14/18  PA = 87/56 (69)  PCW = 31  Fick cardiac output/index = 2.1/1.2  PVR = 12.6 Woods  FA sat = 98%  PA sat = 28%, 28  Patient was transferred to ICU with support of IABP and Milrinone after his RHC.   09/13/11. IABP removed 09/14/11  SVT ablation 09/14/11.   Doing well s/p ablation. Feels much better. No orthopnea or PND. Swan #s improved. Remains on Milrinone 0.375. 24 hour I/O= 1.2 liters. Weight down 10 pounds since admit.  Co-ox  66%.  OOB to chair yesterday.   No SVT on tele. Sinus HR 80-90s  Swan #s today: CVP 1 PA 37/17 PCW unable to wedge CO/CI 3.18/1.77  Objective:   Weight Range:  Vital Signs:   Temp:  [97.2 F (36.2 C)-98.6 F (37 C)] 98.2 F (36.8 C) (07/30 0615) Pulse Rate:  [69-96] 87  (07/30  0615) Resp:  [14-39] 20  (07/30 0615) BP: (104-125)/(59-86) 105/70 mmHg (07/30 0600) SpO2:  [91 %-99 %] 98 % (07/30 0615) Weight:  [59.4 kg (130 lb 15.3 oz)] 59.4 kg (130 lb 15.3 oz) (07/30 0600) Last BM Date: 09/13/11  Weight change: Filed Weights   09/13/11 0700 09/14/11 0600 09/15/11 0600  Weight: 60.4 kg (133 lb 2.5 oz) 59.6 kg (131 lb 6.3 oz) 59.4 kg (130 lb 15.3 oz)    Intake/Output:   Intake/Output Summary (Last 24 hours) at 09/15/11 0724 Last data filed at 09/15/11 0600  Gross per 24 hour  Intake  950.1 ml  Output   2175 ml  Net -1224.9 ml     Physical Exam:  General: Improved  NAD  Neck: supple. Swan in place. Carotids 2+ bilat; no bruits. No lymphadenopathy or thryomegaly appreciated.  Cor: PMI lateral displaced. Regula 2/6 Russell no s3 Lungs:Clear anteriorly Abdomen: soft, nontender No hepatosplenomegaly. No bruits or masses. Good bowel sounds. Extremities: no cyanosis, clubbing, rash, Neuro: alert & orientedx3, cranial nerves grossly intact. moves all 4 extremities w/o difficulty. Affect pleasant  Telemetry: SR 80s-90s  Labs: Basic Metabolic Panel:  Lab 09/15/11 4540 09/14/11 0257 09/13/11 0445 09/12/11 0144 09/11/11 0141  NA 130* 129* 132* 132* 138  K 4.7 4.1 3.8 3.8 3.9  CL 94* 92* 91* 97 101  CO2 26 29 29 29 27   GLUCOSE 118* 98 157* 114* 97  BUN 32*  29* 32* 28* 30*  CREATININE 1.41* 1.36* 1.81* 1.56* 1.57*  CALCIUM 9.5 9.4 9.6 -- --  MG -- 1.7 1.8 1.8 1.6  PHOS -- -- -- -- --    Liver Function Tests: No results found for this basename: AST:5,ALT:5,ALKPHOS:5,BILITOT:5,PROT:5,ALBUMIN:5 in the last 168 hours No results found for this basename: LIPASE:5,AMYLASE:5 in the last 168 hours  CBC:  Lab 09/14/11 0257 09/13/11 0445 09/12/11 0144 09/11/11 0141 09/10/11 2015  WBC 7.0 6.5 8.2 6.4 6.6  NEUTROABS -- -- -- -- --  HGB 13.9 13.3 13.2 11.6* 12.2*  HCT 38.9* 40.1 38.1* 35.4* 36.8*  MCV 94.4 97.6 96.9 99.4 100.0  PLT 339 404* 449* 470* 505*     Cardiac Enzymes:  Lab 09/08/11 1936 09/08/11 1355 09/08/11 0824  CKTOTAL 65 67 77  CKMB 3.5 3.8 4.2*  CKMBINDEX -- -- --  TROPONINI <0.30 <0.30 <0.30    BNP: BNP (last 3 results)  Basename 09/08/11 0244 08/30/11 0520 08/23/11 0354  PROBNP 19404.0* 7192.0* 18397.0*     Other results: Imaging: No results found.   Medications:     Scheduled Medications:    . amiodarone  150 mg Intravenous Once  . aspirin EC  81 mg Oral Daily  . bupivacaine      . fentaNYL      . heparin  5,000 Units Subcutaneous Q8H  . hydrALAZINE  37.5 mg Oral Q8H  . isosorbide mononitrate  30 mg Oral Daily  . magnesium sulfate 1 - 4 g bolus IVPB  2 g Intravenous Once  . midazolam      . sodium chloride  3 mL Intravenous Q12H  . spironolactone  25 mg Oral Daily  . DISCONTD: oxyCODONE  5 mg Oral QHS  . DISCONTD: pantoprazole  40 mg Oral Daily  . DISCONTD: Thrombi-Pad  1 each Topical Once    Infusions:    . milrinone 0.375 mcg/kg/min (09/14/11 2055)  . DISCONTD: amiodarone (NEXTERONE PREMIX) 360 mg/200 mL dextrose 1 mg/min (09/14/11 0810)  . DISCONTD: amiodarone (NEXTERONE PREMIX) 360 mg/200 mL dextrose      PRN Medications: sodium chloride, sodium chloride, acetaminophen, acetaminophen, albuterol, docusate sodium, ondansetron (ZOFRAN) IV, oxyCODONE-acetaminophen, sodium chloride, DISCONTD: sodium chloride, DISCONTD: acetaminophen, DISCONTD: calcium carbonate, DISCONTD:  HYDROmorphone (DILAUDID) injection, DISCONTD: nitroGLYCERIN, DISCONTD: ondansetron (ZOFRAN) IV, DISCONTD: oxyCODONE-acetaminophen   Assessment:  1. Cardiogenic shock with elevated filling pressure 2. A/C biventricular heart failure with low output and cardiogenic shock (primary diagnosis) 3. 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 severe Russell and TR,  5.Severe pulmonary hypertension.  6. Cardiorenal syndrome  7. A/C renal failure, likely multifactorial including cardiorenal syndrome  8. Junctional/retrograde tachycardia  9.  Hypertension  10. Hypomagnesemia/hyponatremia 11. Ongoing Tobacco abuse  12. Remote h/o cocaine and Marijuana abuse 13. S/P SVT ablation   Plan/Discussion:   Volume status low. Continue to hold diuretics. SBP <110. Hold beta blocker. Will not titrate hydralazine. Cardiac index remains low may required home inotropes if unable to wean. Renal function ok.   S/P SVT ablation. Maintaining SR.   Continue to mobilize.  CLEGG,AMY,NP-C 7:24 AM  Patient seen and examined with Tonye Becket, NP. We discussed all aspects of the encounter. I agree with the assessment and plan as stated above.   Much improved s/p ablation and with inotropic support. We d/c'd [name] today. Will keep IJ sheath in to monitor co-oxs during inotrope wean (please do not d/c). Volume status on low end. Would not restart diuretics. May need a little fluid back. Encourage po  intake today. Pt to see to mobilized. Add low dose b-blocker and dig today. Will likely begin milrinone wean in am. Can transfer to stepdown.    The patient is critically ill with multiple organ systems failure and requires high complexity decision making for assessment and support, frequent evaluation and titration of therapies, application of advanced monitoring technologies and extensive interpretation of multiple databases.   Critical Care Time devoted to patient care services described in this note is 35 Minutes.   Russell Bensimhon,MD 10:49 AM

## 2011-09-15 NOTE — Progress Notes (Signed)
Subjective:  Right sided chest pain on palpation. Afebrile. No cough. Good urine output off diuretic. On milrinone drip x 6th day.  Objective:  Vital Signs in the last 24 hours: Temp:  [97.2 F (36.2 C)-98.6 F (37 C)] 98.2 F (36.8 C) (07/30 0615) Pulse Rate:  [69-96] 87  (07/30 0615) Cardiac Rhythm:  [-] Normal sinus rhythm (07/29 2100) Resp:  [14-39] 20  (07/30 0615) BP: (104-125)/(59-86) 105/70 mmHg (07/30 0600) SpO2:  [91 %-99 %] 98 % (07/30 0615) Weight:  [59.4 kg (130 lb 15.3 oz)] 59.4 kg (130 lb 15.3 oz) (07/30 0600)  Physical Exam: BP Readings from Last 1 Encounters:  09/15/11 105/70    Wt Readings from Last 1 Encounters:  09/15/11 59.4 kg (130 lb 15.3 oz)    Weight change: -0.2 kg (-7.1 oz)  HEENT: [place]/AT, Eyes-Brown, PERL, EOMI, Conjunctiva-Pink, Sclera-Non-icteric Neck: No JVD, No bruit, Trachea midline. Lungs:  Clear, Bilateral. Cardiac:  Regular rhythm, normal S1 and S2, no S3.  Abdomen:  Soft, non-tender. Extremities:  No edema present. No cyanosis. No clubbing. CNS: AxOx3, Cranial nerves grossly intact, moves all 4 extremities. Right handed. Skin: Warm and dry.   Intake/Output from previous day: 07/29 0701 - 07/30 0700 In: 950.1 [P.O.:700; I.V.:250.1] Out: 2175 [Urine:2175]    Lab Results: BMET    Component Value Date/Time   NA 130* 09/15/2011 0436   K 4.7 09/15/2011 0436   CL 94* 09/15/2011 0436   CO2 26 09/15/2011 0436   GLUCOSE 118* 09/15/2011 0436   BUN 32* 09/15/2011 0436   CREATININE 1.41* 09/15/2011 0436   CALCIUM 9.5 09/15/2011 0436   GFRNONAA 52* 09/15/2011 0436   GFRAA 61* 09/15/2011 0436   CBC    Component Value Date/Time   WBC 7.0 09/14/2011 0257   RBC 4.12* 09/14/2011 0257   HGB 13.9 09/14/2011 0257   HCT 38.9* 09/14/2011 0257   PLT 339 09/14/2011 0257   MCV 94.4 09/14/2011 0257   MCH 33.7 09/14/2011 0257   MCHC 35.7 09/14/2011 0257   RDW 13.3 09/14/2011 0257   LYMPHSABS 1.2 11/10/2010 1019   MONOABS 0.7 11/10/2010 1019   EOSABS 0.3 11/10/2010  1019   BASOSABS 0.1 11/10/2010 1019   CARDIAC ENZYMES Lab Results  Component Value Date   CKTOTAL 65 09/08/2011   CKMB 3.5 09/08/2011   TROPONINI <0.30 09/08/2011    Assessment/Plan:  Patient Active Hospital Problem List:  Cardiogenic shock (09/10/2011) Assessment: Stable Plan: per HF team. 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

## 2011-09-16 DIAGNOSIS — I471 Supraventricular tachycardia: Secondary | ICD-10-CM

## 2011-09-16 DIAGNOSIS — I498 Other specified cardiac arrhythmias: Secondary | ICD-10-CM

## 2011-09-16 LAB — BASIC METABOLIC PANEL
BUN: 26 mg/dL — ABNORMAL HIGH (ref 6–23)
Chloride: 97 mEq/L (ref 96–112)
GFR calc Af Amer: 63 mL/min — ABNORMAL LOW (ref >90)
GFR calc non Af Amer: 55 mL/min — ABNORMAL LOW (ref >90)
Potassium: 4.7 mEq/L (ref 3.5–5.1)
Sodium: 132 mEq/L — ABNORMAL LOW (ref 135–145)

## 2011-09-16 LAB — CARBOXYHEMOGLOBIN
Methemoglobin: 1.1 % (ref 0.0–1.5)
O2 Saturation: 63.4 %
Total hemoglobin: 13.3 g/dL — ABNORMAL LOW (ref 13.5–18.0)

## 2011-09-16 MED ORDER — HYDRALAZINE HCL 50 MG PO TABS
50.0000 mg | ORAL_TABLET | Freq: Three times a day (TID) | ORAL | Status: DC
  Administered 2011-09-16 – 2011-09-22 (×17): 50 mg via ORAL
  Filled 2011-09-16 (×21): qty 1

## 2011-09-16 MED ORDER — TRAMADOL HCL 50 MG PO TABS
50.0000 mg | ORAL_TABLET | Freq: Four times a day (QID) | ORAL | Status: DC | PRN
  Administered 2011-09-16 (×2): 50 mg via ORAL
  Filled 2011-09-16 (×3): qty 1

## 2011-09-16 NOTE — Progress Notes (Signed)
Advanced Heart Failure Rounding Note  Referring Physician: Dr. Sherryl Manges  Primary Physician: Dr. [name]  Primary Cardiologist: Dr. [name]   Weight range: 140-144 lbs. 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 He has ruled out MI with normal cardiac enzymes. Last cardiac cath in 01/2011 showed normal coronary arteries. He was recently hospitalized in June and July this year for acute decompensated CHF ( a total 6 admission since Feb, 2012). 2 D echo in 08/2011: LVEF 25-30% diffuse hypokinesis, severe Russell, severe TR, RV moderately to severely reduced. PA 57.    Patient continues to have NYHA IV symptoms. He underwent RHC yesterday with the following findings: RA = 17  RV = 87/14/18  PA = 87/56 (69)  PCW = 31  Fick cardiac output/index = 2.1/1.2  PVR = 12.6 Woods  FA sat = 98%  PA sat = 28%, 28  Patient was transferred to ICU with support of IABP and Milrinone after his RHC.   09/13/11. IABP removed 09/14/11  SVT ablation 09/14/11 09/15/11 Swan removed  Transferred to step down. Yesterday, carvedilol and digoxin added. Remains on Milrinone 0.375 (day 6). Volume remained low diuretics held. -1.3 liters. Ambulating in room.    No SVT on tele. Sinus HR 80-90s  Feeling much better. Denies SOB/PND/Orthopnea.     Objective:   Weight Range:  Vital Signs:   Temp:  [97.2 F (36.2 C)-98.6 F (37 C)] 98.5 F (36.9 C) (07/31 0400) Pulse Rate:  [60-92] 79  (07/31 0400) Resp:  [14-24] 14  (07/31  0400) BP: (101-131)/(63-80) 117/72 mmHg (07/31 0400) SpO2:  [87 %-98 %] 95 % (07/31 0400) Weight:  [59.5 kg (131 lb 2.8 oz)] 59.5 kg (131 lb 2.8 oz) (07/31 0630) Last BM Date: 09/14/11  Weight change: Filed Weights   09/14/11 0600 09/15/11 0600 09/16/11 0630  Weight: 59.6 kg (131 lb 6.3 oz) 59.4 kg (130 lb 15.3 oz) 59.5 kg (131 lb 2.8 oz)    Intake/Output:   Intake/Output Summary (Last 24 hours) at 09/16/11 0705 Last data filed at 09/16/11 0600  Gross per 24 hour  Intake  858.4 ml  Output   2250 ml  Net -1391.6 ml     Physical Exam:  General: Improved  NAD  Neck: supple. Swan sleeve in place. Carotids 2+ bilat; no bruits. No lymphadenopathy or thryomegaly appreciated.  Cor: PMI lateral displaced. Regula 2/6 Russell no s3 JVP 5-6 Lungs:Clear anteriorly Abdomen: soft, nontender No hepatosplenomegaly. No bruits or masses. Good bowel sounds. Extremities: no cyanosis, clubbing, rash, Neuro: alert & orientedx3, cranial nerves grossly intact. moves all 4 extremities w/o difficulty. Affect pleasant  Telemetry: SR 80s-90s  Labs: Basic Metabolic Panel:  Lab 09/15/11 5784 09/14/11 0257 09/13/11 0445 09/12/11 0144 09/11/11 0141  NA 130* 129* 132* 132* 138  K 4.7 4.1 3.8 3.8 3.9  CL 94* 92* 91* 97 101  CO2 26 29 29 29 27   GLUCOSE 118* 98 157* 114* 97  BUN 32* 29* 32* 28* 30*  CREATININE 1.41* 1.36* 1.81* 1.56* 1.57*  CALCIUM 9.5 9.4 9.6 -- --  MG -- 1.7 1.8 1.8 1.6  PHOS -- -- -- -- --    Liver Function Tests: No results found for this basename: AST:5,ALT:5,ALKPHOS:5,BILITOT:5,PROT:5,ALBUMIN:5 in the last 168 hours No results found for this basename: LIPASE:5,AMYLASE:5 in the last 168 hours  CBC:  Lab 09/14/11 0257 09/13/11 0445 09/12/11 0144 09/11/11 0141 09/10/11 2015  WBC 7.0 6.5 8.2 6.4 6.6  NEUTROABS -- -- -- -- --  HGB 13.9 13.3 13.2 11.6* 12.2*  HCT 38.9* 40.1 38.1* 35.4* 36.8*  MCV 94.4 97.6 96.9 99.4 100.0  PLT 339 404* 449* 470* 505*    Cardiac Enzymes: No  results found for this basename: CKTOTAL:5,CKMB:5,CKMBINDEX:5,TROPONINI:5 in the last 168 hours  BNP: BNP (last 3 results)  Basename 09/08/11 0244 08/30/11 0520 08/23/11 0354  PROBNP 19404.0* 7192.0* 18397.0*     Other results: Imaging: No results found.   Medications:     Scheduled Medications:    . aspirin EC  81 mg Oral Daily  . carvedilol  3.125 mg Oral BID WC  . digoxin  0.125 mg Oral Daily  . heparin  5,000 Units Subcutaneous Q8H  . hydrALAZINE  37.5 mg Oral Q8H  . isosorbide mononitrate  30 mg Oral Daily  . pantoprazole  40 mg Oral Daily  . sodium chloride  3 mL Intravenous Q12H  . spironolactone  25 mg Oral Daily    Infusions:    . milrinone 0.375 mcg/kg/min (09/15/11 2359)    PRN Medications: sodium chloride, sodium chloride, acetaminophen, acetaminophen, albuterol, calcium carbonate, docusate sodium, nitroGLYCERIN, ondansetron (ZOFRAN) IV, oxyCODONE-acetaminophen, sodium chloride, DISCONTD: oxyCODONE-acetaminophen   Assessment:  1. Cardiogenic shock with elevated filling pressure 2. A/C biventricular heart failure with low output and cardiogenic shock (primary diagnosis) 3. 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 severe Russell and TR,  5.Severe pulmonary hypertension.  6. Cardiorenal syndrome  7. A/C renal failure, likely multifactorial including cardiorenal syndrome  8. Junctional/retrograde tachycardia  9. Hypertension  10. Hypomagnesemia/hyponatremia 11. Ongoing Tobacco abuse  12. Remote h/o cocaine and Marijuana abuse 13. S/P SVT ablation   Plan/Discussion:   Volume status stable.  Continue to hold diuretics. Titrate hydralazine. No ace due renal insufficiency.  BMET /CO-OX pending. If CO-OX ok will start to wean Milrinone.   S/P SVT ablation. Maintaining SR.   Continue to mobilize.  CLEGG,AMY,NP-C 7:05 AM  Patient seen and examined with Tonye Becket, NP. We discussed all aspects of the encounter. I agree with the assessment and plan as stated above.    Continues to improve steadily. Swan d/c'd yesterday. Ambulating around unit. Needs PT eval. SVT quiescent after ablation. Await labs. If co-ox and renal function doing ok would start milrinone wean. Consider repeat echo as milrinone is weaned.   Jeramie Scogin,MD 7:42 AM

## 2011-09-16 NOTE — Evaluation (Signed)
Physical Therapy Evaluation Patient Details Name: [name] MRN: [id number] DOB: [birth year] Today's Date: 09/16/2011 Time: 4098-1191 PT Time Calculation (min): 18 min  PT Assessment / Plan / Recommendation Clinical Impression  PAtient s/p admit for cardiogenic shock.  Right and left sided heart failure.  Has little functional deficits.  Needs to be more mobile.  Will benefit from PT in hospital to incr mobility for d/c home.    PT Assessment  Patient needs continued PT services    Follow Up Recommendations  No PT follow up    Barriers to Discharge        Equipment Recommendations  None recommended by PT    Recommendations for Other Services     Frequency Min 3X/week    Precautions / Restrictions Precautions Precautions: Fall Restrictions Weight Bearing Restrictions: No   Pertinent Vitals/Pain VSS, No pain      Mobility  Bed Mobility Bed Mobility: Rolling Left;Left Sidelying to Sit Rolling Left: 7: Independent Left Sidelying to Sit: 7: Independent Transfers Transfers: Sit to Stand;Stand to Sit Sit to Stand: 7: Independent Stand to Sit: 7: Independent Ambulation/Gait Ambulation/Gait Assistance: 4: Min guard Ambulation Distance (Feet): 20 Feet Assistive device: None Ambulation/Gait Assistance Details: PAtient ambulated around the bed with steady gait without challenges given.  Limited distance secondary to no portable monitor and patient states he would rather walk in hall later.   Gait Pattern: Within Functional Limits Stairs: No Wheelchair Mobility Wheelchair Mobility: No    Exercises General Exercises - Upper Extremity Shoulder Flexion: AROM;Both;10 reps;Seated Elbow Flexion: AROM;Both;10 reps;Seated Elbow Extension: AROM;Right;10 reps;Seated General Exercises - Lower Extremity Ankle Circles/Pumps: AROM;Both;10 reps;Supine Long Arc Quad: AROM;Both;10 reps;Supine Heel Slides: AROM;Both;10 reps;Supine   PT Diagnosis: Generalized weakness  PT Problem  List: Decreased activity tolerance;Decreased balance;Decreased mobility PT Treatment Interventions: Gait training;Functional mobility training;Therapeutic activities;Patient/family education   PT Goals Acute Rehab PT Goals PT Goal Formulation: With patient Time For Goal Achievement: 09/23/11 Potential to Achieve Goals: Good Pt will Ambulate: >150 feet;with modified independence;with least restrictive assistive device PT Goal: Ambulate - Progress: Goal set today Pt will Go Up / Down Stairs: 3-5 stairs;with modified independence;with least restrictive assistive device PT Goal: Up/Down Stairs - Progress: Goal set today Additional Goals Additional Goal #1: PAtient will score >51/56 on Berg.   PT Goal: Additional Goal #1 - Progress: Goal set today  Visit Information  Last PT Received On: 09/16/11 Assistance Needed: +1    Subjective Data  Subjective: "I have been having shoulder pain" Patient Stated Goal: To go home   Prior Functioning  Home Living Lives With: Spouse Available Help at Discharge: Family;Available PRN/intermittently Type of Home: House Home Access: Stairs to enter [employer] of Steps: 4 Entrance Stairs-Rails: None Home Layout: One level Bathroom Shower/Tub: Forensic psychologist: None Prior Function Level of Independence: Independent Able to Take Stairs?: Yes Driving: Yes Vocation: On disability Communication Communication: No difficulties Dominant Hand: Right    Cognition  Overall Cognitive Status: Appears within functional limits for tasks assessed/performed Arousal/Alertness: Awake/alert Orientation Level: Appears intact for tasks assessed Behavior During Session: [facility] for tasks performed    Extremity/Trunk Assessment Right Upper Extremity Assessment RUE ROM/Strength/Tone: WFL for tasks assessed RUE Sensation: WFL - Light Touch RUE Coordination: WFL - gross/fine motor Left Upper Extremity  Assessment LUE ROM/Strength/Tone: WFL for tasks assessed LUE Sensation: WFL - Light Touch LUE Coordination: WFL - gross/fine motor Right Lower Extremity Assessment RLE ROM/Strength/Tone: WFL for tasks assessed RLE Sensation: WFL -  Light Touch RLE Coordination: WFL - gross/fine motor Left Lower Extremity Assessment LLE ROM/Strength/Tone: WFL for tasks assessed LLE Sensation: WFL - Light Touch LLE Coordination: WFL - gross/fine motor Trunk Assessment Trunk Assessment: Normal   Balance Static Standing Balance Static Standing - Balance Support: No upper extremity supported;During functional activity Static Standing - Level of Assistance: 5: Stand by assistance Static Standing - Comment/# of Minutes: 2  End of Session PT - End of Session Equipment Utilized During Treatment: Gait belt Activity Tolerance: Patient tolerated treatment well Patient left: in chair;with call bell/phone within reach Nurse Communication: Mobility status       INGOLD,Phung Kotas 09/16/2011, 10:25 AM  [facility] [phone] (pager)

## 2011-09-16 NOTE — Progress Notes (Signed)
Subjective:  Right sided chest pain and shoulder pain continues. Asking for more pain medications but weak.  Objective:  Vital Signs in the last 24 hours: Temp:  [97.2 F (36.2 C)-98.6 F (37 C)] 98.2 F (36.8 C) (07/31 0730) Pulse Rate:  [60-92] 82  (07/31 0730) Cardiac Rhythm:  [-] Normal sinus rhythm (07/30 2044) Resp:  [14-24] 16  (07/31 0730) BP: (101-131)/(63-85) 122/85 mmHg (07/31 0730) SpO2:  [87 %-98 %] 94 % (07/31 0730) Weight:  [59.5 kg (131 lb 2.8 oz)] 59.5 kg (131 lb 2.8 oz) (07/31 0630)  Physical Exam: BP Readings from Last 1 Encounters:  09/16/11 122/85    Wt Readings from Last 1 Encounters:  09/16/11 59.5 kg (131 lb 2.8 oz)    Weight change: 0.1 kg (3.5 oz)  HEENT: [place]/AT, Eyes-Brown, PERL, EOMI, Conjunctiva-Pink, Sclera-Non-icteric Neck: No JVD, No bruit, Trachea midline. Lungs:  Clear, Bilateral. Cardiac:  Regular rhythm, normal S1 and S2, no S3.  Abdomen:  Soft, non-tender. Extremities:  No edema present. No cyanosis. No clubbing. CNS: AxOx3, Cranial nerves grossly intact, moves all 4 extremities. Right handed. Skin: Warm and dry.   Intake/Output from previous day: 07/30 0701 - 07/31 0700 In: 858.4 [P.O.:480; I.V.:378.4] Out: 2250 [Urine:2250]    Lab Results: BMET    Component Value Date/Time   NA 130* 09/15/2011 0436   K 4.7 09/15/2011 0436   CL 94* 09/15/2011 0436   CO2 26 09/15/2011 0436   GLUCOSE 118* 09/15/2011 0436   BUN 32* 09/15/2011 0436   CREATININE 1.41* 09/15/2011 0436   CALCIUM 9.5 09/15/2011 0436   GFRNONAA 52* 09/15/2011 0436   GFRAA 61* 09/15/2011 0436   CBC    Component Value Date/Time   WBC 7.0 09/14/2011 0257   RBC 4.12* 09/14/2011 0257   HGB 13.9 09/14/2011 0257   HCT 38.9* 09/14/2011 0257   PLT 339 09/14/2011 0257   MCV 94.4 09/14/2011 0257   MCH 33.7 09/14/2011 0257   MCHC 35.7 09/14/2011 0257   RDW 13.3 09/14/2011 0257   LYMPHSABS 1.2 11/10/2010 1019   MONOABS 0.7 11/10/2010 1019   EOSABS 0.3 11/10/2010 1019   BASOSABS 0.1  11/10/2010 1019   CARDIAC ENZYMES Lab Results  Component Value Date   CKTOTAL 65 09/08/2011   CKMB 3.5 09/08/2011   TROPONINI <0.30 09/08/2011    Assessment/Plan:  Patient Active Hospital Problem List: Pulmonary hypertension (09/11/2011) Stable Cardiogenic shock (09/10/2011) Stable Cardiorenal syndrome (09/10/2011) Stable Brachial paresis (09/10/2011)  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 Follow with HF team   LOS: 8 days    [name]  MD  09/16/2011, 8:24 AM

## 2011-09-17 LAB — BASIC METABOLIC PANEL
Calcium: 9.7 mg/dL (ref 8.4–10.5)
Creatinine, Ser: 1.3 mg/dL (ref 0.50–1.35)
GFR calc non Af Amer: 58 mL/min — ABNORMAL LOW (ref >90)
Glucose, Bld: 98 mg/dL (ref 70–99)
Sodium: 129 mEq/L — ABNORMAL LOW (ref 135–145)

## 2011-09-17 LAB — CARBOXYHEMOGLOBIN
Methemoglobin: 0.7 % (ref 0.0–1.5)
O2 Saturation: 61.5 %
O2 Saturation: 91.3 %
O2 Saturation: 55.4 %
Total hemoglobin: 12.7 g/dL — ABNORMAL LOW (ref 13.5–18.0)
Total hemoglobin: 13.7 g/dL (ref 13.5–18.0)
Total hemoglobin: 13.7 g/dL (ref 13.5–18.0)

## 2011-09-17 MED ORDER — ADULT MULTIVITAMIN W/MINERALS CH
1.0000 | ORAL_TABLET | Freq: Every day | ORAL | Status: DC
  Administered 2011-09-17 – 2011-09-22 (×6): 1 via ORAL
  Filled 2011-09-17 (×6): qty 1

## 2011-09-17 MED ORDER — CARVEDILOL 6.25 MG PO TABS
6.2500 mg | ORAL_TABLET | Freq: Two times a day (BID) | ORAL | Status: DC
  Administered 2011-09-17 – 2011-09-22 (×9): 6.25 mg via ORAL
  Filled 2011-09-17 (×12): qty 1

## 2011-09-17 MED ORDER — SPIRONOLACTONE 12.5 MG HALF TABLET
12.5000 mg | ORAL_TABLET | Freq: Every day | ORAL | Status: DC
  Administered 2011-09-17: 12.5 mg via ORAL
  Filled 2011-09-17 (×2): qty 1

## 2011-09-17 MED ORDER — METOPROLOL TARTRATE 1 MG/ML IV SOLN
5.0000 mg | Freq: Once | INTRAVENOUS | Status: AC
  Administered 2011-09-17: 5 mg via INTRAVENOUS
  Filled 2011-09-17: qty 5

## 2011-09-17 NOTE — Progress Notes (Addendum)
Advanced Heart Failure Rounding Note  Referring Physician: Dr. Sherryl Manges  Primary Physician: Dr. [name]  Primary Cardiologist: Dr. [name]   Weight range: 140-144 lbs. 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 He has ruled out MI with normal cardiac enzymes. Last cardiac cath in 01/2011 showed normal coronary arteries. He was recently hospitalized in June and July this year for acute decompensated CHF ( a total 6 admission since Feb, 2012). 2 D echo in 08/2011: LVEF 25-30% diffuse hypokinesis, severe Russell, severe TR, RV moderately to severely reduced. PA 57.    Patient continues to have NYHA IV symptoms. He underwent RHC yesterday with the following findings: RA = 17  RV = 87/14/18  PA = 87/56 (69)  PCW = 31  Fick cardiac output/index = 2.1/1.2  PVR = 12.6 Woods  FA sat = 98%  PA sat = 28%, 28  Patient was transferred to ICU with support of IABP and Milrinone after his RHC.   09/13/11. IABP removed 09/14/11  SVT ablation 09/14/11 09/15/11 Swan removed  Transferred to step down. Yesterday, Milrinone weaned to 0.25 mcg and hydralazine titrated up. Diuretics held. I/O not accurate due to BM in bathroom.  Ambulating without difficulty. No role for ongoing PT. BM 09/16/11   No SVT on tele. Sinus HR 60-70s   Denies SOB/PND/Orthopnea.     Objective:   Weight Range:  Vital Signs:   Temp:  [97.5 F (36.4 C)-98.2 F (36.8 C)] 97.5 F (36.4 C) (08/01 0406) Pulse Rate:  [70-87] 73  (07/31 1700) Resp:   [14-22] 18  (08/01 0406) BP: (119-131)/(73-85) 131/79 mmHg (08/01 0350) SpO2:  [94 %-100 %] 96 % (08/01 0406) Weight:  [59.4 kg (130 lb 15.3 oz)] 59.4 kg (130 lb 15.3 oz) (08/01 0500) Last BM Date: 09/15/11  Weight change: Filed Weights   09/15/11 0600 09/16/11 0630 09/17/11 0500  Weight: 59.4 kg (130 lb 15.3 oz) 59.5 kg (131 lb 2.8 oz) 59.4 kg (130 lb 15.3 oz)    Intake/Output:   Intake/Output Summary (Last 24 hours) at 09/17/11 0656 Last data filed at 09/17/11 0500  Gross per 24 hour  Intake  829.2 ml  Output   1550 ml  Net -720.8 ml     Physical Exam:  General: Improved  NAD  Neck: supple. Swan sleeve in place. Carotids 2+ bilat; no bruits. No lymphadenopathy or thryomegaly appreciated.  Cor: PMI lateral displaced. Regula 2/6 Russell no s3 JVP 5-6 Lungs:Clear anteriorly Abdomen: soft, nontender No hepatosplenomegaly. No bruits or masses. Good bowel sounds. Extremities: no cyanosis, clubbing, rash, Neuro: alert & orientedx3, cranial nerves grossly intact. moves all 4 extremities w/o difficulty. Affect pleasant  Telemetry: SR 60-70s  Labs: Basic Metabolic Panel:  Lab 09/17/11 1610 09/16/11 0500 09/15/11 0436 09/14/11 0257 09/13/11 0445 09/12/11 0144 09/11/11 0141  NA 129* 132* 130* 129* 132* -- --  K 4.9 4.7 4.7 4.1 3.8 -- --  CL 94* 97 94* 92* 91* -- --  CO2 27 25 26 29 29  -- --  GLUCOSE 98  105* 118* 98 157* -- --  BUN 29* 26* 32* 29* 32* -- --  CREATININE 1.30 1.36* 1.41* 1.36* 1.81* -- --  CALCIUM 9.7 9.6 9.5 -- -- -- --  MG -- -- -- 1.7 1.8 1.8 1.6  PHOS -- -- -- -- -- -- --    Liver Function Tests: No results found for this basename: AST:5,ALT:5,ALKPHOS:5,BILITOT:5,PROT:5,ALBUMIN:5 in the last 168 hours No results found for this basename: LIPASE:5,AMYLASE:5 in the last 168 hours  CBC:  Lab 09/14/11 0257 09/13/11 0445 09/12/11 0144 09/11/11 0141 09/10/11 2015  WBC 7.0 6.5 8.2 6.4 6.6  NEUTROABS -- -- -- -- --  HGB 13.9 13.3 13.2 11.6* 12.2*  HCT 38.9* 40.1  38.1* 35.4* 36.8*  MCV 94.4 97.6 96.9 99.4 100.0  PLT 339 404* 449* 470* 505*    Cardiac Enzymes: No results found for this basename: CKTOTAL:5,CKMB:5,CKMBINDEX:5,TROPONINI:5 in the last 168 hours  BNP: BNP (last 3 results)  Basename 09/08/11 0244 08/30/11 0520 08/23/11 0354  PROBNP 19404.0* 7192.0* 18397.0*     Other results: Imaging: No results found.   Medications:     Scheduled Medications:    . aspirin EC  81 mg Oral Daily  . carvedilol  3.125 mg Oral BID WC  . digoxin  0.125 mg Oral Daily  . heparin  5,000 Units Subcutaneous Q8H  . hydrALAZINE  50 mg Oral Q8H  . isosorbide mononitrate  30 mg Oral Daily  . pantoprazole  40 mg Oral Daily  . sodium chloride  3 mL Intravenous Q12H  . spironolactone  25 mg Oral Daily  . DISCONTD: hydrALAZINE  37.5 mg Oral Q8H    Infusions:    . milrinone 0.25 mcg/kg/min (09/16/11 2040)    PRN Medications: sodium chloride, sodium chloride, acetaminophen, acetaminophen, albuterol, calcium carbonate, docusate sodium, nitroGLYCERIN, ondansetron (ZOFRAN) IV, oxyCODONE-acetaminophen, sodium chloride, traMADol   Assessment:  1. Cardiogenic shock with elevated filling pressure 2. A/C biventricular heart failure with low output and cardiogenic shock (primary diagnosis) 3. 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 severe Russell and TR,  5.Severe pulmonary hypertension.  6. Cardiorenal syndrome  7. A/C renal failure, likely multifactorial including cardiorenal syndrome  8. Junctional/retrograde tachycardia  9. Hypertension  10. Hypomagnesemia/hyponatremia 11. Ongoing Tobacco abuse  12. Remote h/o cocaine and Marijuana abuse 13. S/P SVT ablation   Plan/Discussion:   Volume status stable.  Continue to hold diuretics. Continue to wean Milrinone. Decrease to 0.125 mcg.  Check CO-OX at 12:00 if ok will stop Milrinone this afternoon. No ace due renal insufficiency.  Potassium trending up. Reduce spironolactone 12.5 mg daily. Consider removing swan  sleeve today and transfer to telemetry.   S/P SVT ablation. Maintaining SR.   Continue to mobilize.  [name],[name],[name] 6:56 AM  Patient seen and examined with Russell Becket, [name]. We discussed all aspects of the encounter. I agree with the assessment and plan as stated above.   Continues to do well. Co-ox looks good. Wean milrinone to off today. Repeat echo tomorrow. Possibly home Saturday. No further SVT on tele.   Russell Sarchet,[name] 10:51 AM  Addendum:   Now back in SVT vs AF. Will get 12-lead and d/w EP. Will likely need amio back.  Russell Czaplicki,[name] 11:06 AM

## 2011-09-17 NOTE — Care Management Note (Signed)
    Page 1 of 1   09/17/2011     12:50:47 PM   CARE MANAGEMENT NOTE 09/17/2011  Patient:  [name], [name]   Account Number:  [id number]  Date Initiated:  09/14/2011  Documentation initiated by:  Alvira Philips Assessment:   61 yr-old male adm with dx of STEMI; lives with spouse, independent PTA.     Action/Plan:   Anticipated DC Date:  09/18/2011   Anticipated DC Plan:  HOME W HOME HEALTH SERVICES      DC Planning Services  CM consult      Choice offered to / List presented to:          [facility] arranged  HH - 11 Patient Refused      Status of service:   Medicare Important Message given?   (If response is "NO", the following Medicare IM given date fields will be blank) Date Medicare IM given:   Date Additional Medicare IM given:    Discharge Disposition:    Per UR Regulation:  Reviewed for med. necessity/level of care/duration of stay  If discussed at Long Length of Stay Meetings, dates discussed:   09/15/2011  09/17/2011    Comments:  PCP:  Dr. Eleonore Chiquito  8/1 12:48p debbie Zina Pitzer rn,bsn [phone] spoke w pt and gave pt hhc agency list. pt does not feel he needs hhc even though dr benshimon had rec hhrn. left list and my card and asked him to speak w wife. if he changes his mind and wants hhc i will be glad to arrange.

## 2011-09-17 NOTE — Progress Notes (Signed)
Nutrition Brief Note  Body mass index is 27.97 kg/(m^2). Pt meets criteria for overweight based on current BMI.   Current diet order is heart healthy, patient is consuming approximately 100% of meals at this time. Labs and medications reviewed.   Pt requests a daily multivitamin as he takes on at home. Pt reports good knowledge of low sodium diet, weighs daily. No needs for education at this time.   RD will add vitamin daily, no additional nutrition interventions warranted at this time. If nutrition issues arise, please re-consult RD.   Clarene Duke RD, LDN Pager [phone] After Hours pager [phone]

## 2011-09-17 NOTE — Progress Notes (Signed)
Physical Therapy Discharge Patient Details Name: [name] MRN: [id number] DOB: August 30, 1950 Today's Date: 09/17/2011 Time: 8469-6295 PT Time Calculation (min): 18 min  Patient discharged from PT services secondary to goals met and no further PT needs identified.  Please see latest therapy progress note for current level of functioning and progress toward goals.    Progress and discharge plan discussed with patient and/or caregiver: Patient/Caregiver agrees with plan       Verdell Face, PTA [phone] 09/17/2011

## 2011-09-17 NOTE — Progress Notes (Signed)
Subjective:  Right lower quadrant abdominal pain resolves post BM this AM. No shortness of breath.  Objective:  Vital Signs in the last 24 hours: Temp:  [97.5 F (36.4 C)-98.2 F (36.8 C)] 98 F (36.7 C) (08/01 0730) Pulse Rate:  [70-87] 75  (08/01 0730) Cardiac Rhythm:  [-]  Resp:  [14-22] 17  (08/01 0730) BP: (119-131)/(73-85) 125/82 mmHg (08/01 0730) SpO2:  [94 %-100 %] 97 % (08/01 0730) Weight:  [59.4 kg (130 lb 15.3 oz)] 59.4 kg (130 lb 15.3 oz) (08/01 0500)  Physical Exam: BP Readings from Last 1 Encounters:  09/17/11 125/82    Wt Readings from Last 1 Encounters:  09/17/11 59.4 kg (130 lb 15.3 oz)    Weight change: -0.1 kg (-3.5 oz)  HEENT: [place]/AT, Eyes-Brown, PERL, EOMI, Conjunctiva-Pink, Sclera-Non-icteric Neck: No JVD, No bruit, Trachea midline. Lungs:  Clear, Bilateral. Cardiac:  Regular rhythm, normal S1 and S2, no S3.  Abdomen:  Soft, non-tender. Extremities:  No edema present. No cyanosis. No clubbing. CNS: AxOx3, Cranial nerves grossly intact, moves all 4 extremities. Right handed. Skin: Warm and dry.   Intake/Output from previous day: 07/31 0701 - 08/01 0700 In: 812 [P.O.:480; I.V.:332] Out: 1550 [Urine:1550]    Lab Results: BMET    Component Value Date/Time   NA 129* 09/17/2011 0450   K 4.9 09/17/2011 0450   CL 94* 09/17/2011 0450   CO2 27 09/17/2011 0450   GLUCOSE 98 09/17/2011 0450   BUN 29* 09/17/2011 0450   CREATININE 1.30 09/17/2011 0450   CALCIUM 9.7 09/17/2011 0450   GFRNONAA 58* 09/17/2011 0450   GFRAA 67* 09/17/2011 0450   CBC    Component Value Date/Time   WBC 7.0 09/14/2011 0257   RBC 4.12* 09/14/2011 0257   HGB 13.9 09/14/2011 0257   HCT 38.9* 09/14/2011 0257   PLT 339 09/14/2011 0257   MCV 94.4 09/14/2011 0257   MCH 33.7 09/14/2011 0257   MCHC 35.7 09/14/2011 0257   RDW 13.3 09/14/2011 0257   LYMPHSABS 1.2 11/10/2010 1019   MONOABS 0.7 11/10/2010 1019   EOSABS 0.3 11/10/2010 1019   BASOSABS 0.1 11/10/2010 1019   CARDIAC ENZYMES Lab Results    Component Value Date   CKTOTAL 65 09/08/2011   CKMB 3.5 09/08/2011   TROPONINI <0.30 09/08/2011    Assessment/Plan:  Patient Active Hospital Problem List: Pulmonary hypertension (09/11/2011) Stable  Cardiogenic shock (09/10/2011) Stable Cardiorenal syndrome (09/10/2011) 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 Follow with HF team.   LOS: 9 days    [name]  MD  09/17/2011, 7:36 AM

## 2011-09-17 NOTE — Progress Notes (Signed)
Physical Therapy Treatment Patient Details Name: [name] MRN: [id number] DOB: [birth year] Today's Date: 09/17/2011 Time: 8413-2440 PT Time Calculation (min): 18 min  PT Assessment / Plan / Recommendation Comments on Treatment Session  Pt mobilizing well.  Pt performed Berg Balance Assessment.  Scored 56.  Pt at independent level with mobility, therefore PT services will now sign off.  Pt reports he has been ambulating around halls by himself.      Follow Up Recommendations  No PT follow up    Barriers to Discharge        Equipment Recommendations  None recommended by PT    Recommendations for Other Services    Frequency     Plan All goals met and education completed, patient dischaged from PT services    Precautions / Restrictions Precautions Precautions: Fall Restrictions Weight Bearing Restrictions: No       Mobility  Bed Mobility Bed Mobility: Supine to Sit;Sitting - Scoot to Edge of Bed Supine to Sit: 7: Independent;HOB flat Sitting - Scoot to Edge of Bed: 7: Independent Transfers Transfers: Sit to Stand;Stand to Sit Sit to Stand: 7: Independent Stand to Sit: 7: Independent Ambulation/Gait Ambulation/Gait Assistance: 6: Modified independent (Device/Increase time) Ambulation Distance (Feet): 500 Feet Assistive device: None Ambulation/Gait Assistance Details: No balance deficiencies noted.   Gait Pattern: Within Functional Limits Stairs: No Wheelchair Mobility Wheelchair Mobility: No      PT Goals Acute Rehab PT Goals Potential to Achieve Goals: Good PT Goal: Ambulate - Progress: Met Additional Goals PT Goal: Additional Goal #1 - Progress: Met  Visit Information  Last PT Received On: 09/17/11 Assistance Needed: +1    Subjective Data      Cognition  Overall Cognitive Status: Appears within functional limits for tasks assessed/performed Arousal/Alertness: Awake/alert Orientation Level: Appears intact for tasks assessed Behavior During Session:  [facility] for tasks performed    Balance  Balance Balance Assessed: Yes Standardized Balance Assessment Standardized Balance Assessment: Berg Balance Test Berg Balance Test Sit to Stand: Able to stand without using hands and stabilize independently Standing Unsupported: Able to stand safely 2 minutes Sitting with Back Unsupported but Feet Supported on Floor or Stool: Able to sit safely and securely 2 minutes Stand to Sit: Sits safely with minimal use of hands Transfers: Able to transfer safely, minor use of hands Standing Unsupported with Eyes Closed: Able to stand 10 seconds safely Standing Ubsupported with Feet Together: Able to place feet together independently and stand 1 minute safely From Standing, Reach Forward with Outstretched Arm: Can reach confidently >25 cm (10") From Standing Position, Pick up Object from Floor: Able to pick up shoe safely and easily From Standing Position, Turn to Look Behind Over each Shoulder: Looks behind from both sides and weight shifts well Turn 360 Degrees: Able to turn 360 degrees safely in 4 seconds or less Standing Unsupported, Alternately Place Feet on Step/Stool: Able to stand independently and safely and complete 8 steps in 20 seconds Standing Unsupported, One Foot in Front: Able to place foot tandem independently and hold 30 seconds Standing on One Leg: Able to lift leg independently and hold > 10 seconds Total Score: 56    End of Session PT - End of Session Equipment Utilized During Treatment: Gait belt Activity Tolerance: Patient tolerated treatment well Patient left: in chair;with call bell/phone within reach Nurse Communication: Mobility status    Verdell Face, [place] [phone] 09/17/2011

## 2011-09-18 LAB — BASIC METABOLIC PANEL
CO2: 27 mEq/L (ref 19–32)
Calcium: 9.7 mg/dL (ref 8.4–10.5)
Creatinine, Ser: 1.33 mg/dL (ref 0.50–1.35)
GFR calc non Af Amer: 56 mL/min — ABNORMAL LOW (ref >90)
Glucose, Bld: 100 mg/dL — ABNORMAL HIGH (ref 70–99)
Sodium: 134 mEq/L — ABNORMAL LOW (ref 135–145)

## 2011-09-18 LAB — CARBOXYHEMOGLOBIN
Carboxyhemoglobin: 1.7 % — ABNORMAL HIGH (ref 0.5–1.5)
Methemoglobin: 1.1 % (ref 0.0–1.5)
Methemoglobin: 0.9 % (ref 0.0–1.5)
O2 Saturation: 72.2 %
O2 Saturation: 97.7 %
Total hemoglobin: 12.7 g/dL — ABNORMAL LOW (ref 13.5–18.0)
Total hemoglobin: 14.1 g/dL (ref 13.5–18.0)

## 2011-09-18 LAB — DIGOXIN LEVEL: Digoxin Level: 0.7 ng/mL — ABNORMAL LOW (ref 0.8–2.0)

## 2011-09-18 NOTE — Progress Notes (Signed)
*  PRELIMINARY RESULTS* Echocardiogram 2D Echocardiogram has been performed.  [name] 09/18/2011, 3:20 PM

## 2011-09-18 NOTE — Progress Notes (Signed)
Pt declined to ambulate on this shift.  Currently wants to sleep.  States "Ill get up & walk later."  Amanda Pea, Charity fundraiser.

## 2011-09-18 NOTE — Progress Notes (Signed)
Subjective:  Feeling better. Ready to go home. Afebrile.  Objective:  Vital Signs in the last 24 hours: Temp:  [97.7 F (36.5 C)-98.4 F (36.9 C)] 98.3 F (36.8 C) (08/02 0338) Pulse Rate:  [75-88] 77  (08/01 1600) Cardiac Rhythm:  [-] Heart block;Bundle branch block (08/02 0339) Resp:  [16-18] 18  (08/02 0339) BP: (106-126)/(56-82) 126/75 mmHg (08/02 0657) SpO2:  [96 %-98 %] 98 % (08/02 0338)  Physical Exam: BP Readings from Last 1 Encounters:  09/18/11 126/75    Wt Readings from Last 1 Encounters:  09/17/11 59.4 kg (130 lb 15.3 oz)    Weight change:   HEENT: [place]/AT, Eyes-Brown, PERL, EOMI, Conjunctiva-Pink, Sclera-Non-icteric Neck: No JVD, No bruit, Trachea midline. Lungs:  Clear, Bilateral. Cardiac:  Regular rhythm, normal S1 and S2, no S3.  Abdomen:  Soft, non-tender. Extremities:  No edema present. No cyanosis. No clubbing. CNS: AxOx3, Cranial nerves grossly intact, moves all 4 extremities. Right handed. Skin: Warm and dry.   Intake/Output from previous day: 08/01 0701 - 08/02 0700 In: 1244 [P.O.:840; I.V.:404] Out: 3475 [Urine:3475]    Lab Results: BMET    Component Value Date/Time   NA 134* 09/18/2011 0500   K 5.0 09/18/2011 0500   CL 100 09/18/2011 0500   CO2 27 09/18/2011 0500   GLUCOSE 100* 09/18/2011 0500   BUN 28* 09/18/2011 0500   CREATININE 1.33 09/18/2011 0500   CALCIUM 9.7 09/18/2011 0500   GFRNONAA 56* 09/18/2011 0500   GFRAA 65* 09/18/2011 0500   CBC    Component Value Date/Time   WBC 7.0 09/14/2011 0257   RBC 4.12* 09/14/2011 0257   HGB 13.9 09/14/2011 0257   HCT 38.9* 09/14/2011 0257   PLT 339 09/14/2011 0257   MCV 94.4 09/14/2011 0257   MCH 33.7 09/14/2011 0257   MCHC 35.7 09/14/2011 0257   RDW 13.3 09/14/2011 0257   LYMPHSABS 1.2 11/10/2010 1019   MONOABS 0.7 11/10/2010 1019   EOSABS 0.3 11/10/2010 1019   BASOSABS 0.1 11/10/2010 1019   CARDIAC ENZYMES Lab Results  Component Value Date   CKTOTAL 65 09/08/2011   CKMB 3.5 09/08/2011   TROPONINI <0.30  09/08/2011    Assessment/Plan:  Patient Active Hospital Problem List:  Pulmonary hypertension (09/11/2011)  Stable  Cardiogenic shock (09/10/2011)  Stable Cardiorenal syndrome (09/10/2011)  Stable Brachial paresis (09/10/2011)  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   LOS: 10 days    [name]  MD  09/18/2011, 7:21 AM

## 2011-09-18 NOTE — Progress Notes (Signed)
Advanced Heart Failure Rounding Note  Referring Physician: Dr. Sherryl Manges  Primary Physician: Dr. [name]  Primary Cardiologist: Dr. [name]   Weight range: 140-144 lbs. 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 He has ruled out MI with normal cardiac enzymes. Last cardiac cath in 01/2011 showed normal coronary arteries. He was recently hospitalized in June and July this year for acute decompensated CHF ( a total 6 admission since Feb, 2012). 2 D echo in 08/2011: LVEF 25-30% diffuse hypokinesis, severe Russell, severe TR, RV moderately to severely reduced. PA 57.    Patient continues to have NYHA IV symptoms. He underwent RHC yesterday with the following findings: RA = 17  RV = 87/14/18  PA = 87/56 (69)  PCW = 31  Fick cardiac output/index = 2.1/1.2  PVR = 12.6 [place]  FA sat = 98%  PA sat = 28%, 28  Patient was transferred to ICU with support of IABP and Milrinone after his RHC.   09/13/11. IABP removed 09/14/11  SVT ablation 09/14/11 09/15/11 Swan removed 09/17/11 Milrionone stopped  Yesterday Milrionone stopped and Spironolactone reduced to 12.5 mg due to elevated potassium. 4 hour I/O= -2.2 liters. Sinus HR 60-70s   Feels good. Wants to go home. Denies SOB/PND/Orthopnea.   Telemetry: Mostly sinus. Multiple brief runs of SVT.    Objective:   Weight Range:  Vital Signs:   Temp:  [97.7 F (36.5 C)-98.4 F (36.9 C)] 98.3 F (36.8 C) (08/02 0338) Pulse Rate:  [77-88] 77  (08/01 1600) Resp:   [16-18] 18  (08/02 0339) BP: (106-126)/(56-76) 126/75 mmHg (08/02 0657) SpO2:  [96 %-98 %] 98 % (08/02 0338) Last BM Date: 09/17/11  Weight change: Filed Weights   09/15/11 0600 09/16/11 0630 09/17/11 0500  Weight: 59.4 kg (130 lb 15.3 oz) 59.5 kg (131 lb 2.8 oz) 59.4 kg (130 lb 15.3 oz)    Intake/Output:   Intake/Output Summary (Last 24 hours) at 09/18/11 0746 Last data filed at 09/18/11 0700  Gross per 24 hour  Intake 1241.6 ml  Output   2775 ml  Net -1533.4 ml     Physical Exam:  General: Improved  NAD  Neck: supple. Swan sleeve in place. Carotids 2+ bilat; no bruits. No lymphadenopathy or thryomegaly appreciated.  Cor: PMI lateral displaced. Regula 2/6 Russell no s3 JVP 5-6 Lungs:Clear anteriorly Abdomen: soft, nontender No hepatosplenomegaly. No bruits or masses. Good bowel sounds. Extremities: no cyanosis, clubbing, rash, Neuro: alert & orientedx3, cranial nerves grossly intact. moves all 4 extremities w/o difficulty. Affect pleasant  Telemetry: SR 60-70s  Labs: Basic Metabolic Panel:  Lab 09/18/11 1610 09/17/11 0450 09/16/11 0500 09/15/11 0436 09/14/11 0257 09/13/11 0445 09/12/11 0144  NA 134* 129* 132* 130* 129* -- --  K 5.0 4.9 4.7 4.7 4.1 -- --  CL 100 94* 97 94* 92* -- --  CO2 27 27 25 26 29  -- --  GLUCOSE 100* 98 105* 118* 98 -- --  BUN 28* 29* 26* 32* 29* -- --  CREATININE 1.33 1.30 1.36* 1.41* 1.36* -- --  CALCIUM 9.7 9.7 9.6 -- -- -- --  MG -- -- -- -- 1.7 1.8 1.8  PHOS -- -- -- -- -- -- --    Liver Function Tests: No results found for this basename: AST:5,ALT:5,ALKPHOS:5,BILITOT:5,PROT:5,ALBUMIN:5 in the last 168 hours No results found for this basename: LIPASE:5,AMYLASE:5 in the last 168 hours  CBC:  Lab 09/14/11 0257 09/13/11 0445 09/12/11 0144  WBC 7.0 6.5 8.2  NEUTROABS -- -- --  HGB 13.9 13.3 13.2  HCT 38.9* 40.1 38.1*  MCV 94.4 97.6 96.9  PLT 339 404* 449*    Cardiac Enzymes: No results found for this basename:  CKTOTAL:5,CKMB:5,CKMBINDEX:5,TROPONINI:5 in the last 168 hours  BNP: BNP (last 3 results)  Basename 09/08/11 0244 08/30/11 0520 08/23/11 0354  PROBNP 19404.0* 7192.0* 18397.0*     Other results: Imaging: No results found.   Medications:     Scheduled Medications:    . aspirin EC  81 mg Oral Daily  . carvedilol  6.25 mg Oral BID WC  . digoxin  0.125 mg Oral Daily  . heparin  5,000 Units Subcutaneous Q8H  . hydrALAZINE  50 mg Oral Q8H  . isosorbide mononitrate  30 mg Oral Daily  . metoprolol  5 mg Intravenous Once  . multivitamin with minerals  1 tablet Oral Daily  . pantoprazole  40 mg Oral Daily  . sodium chloride  3 mL Intravenous Q12H  . spironolactone  12.5 mg Oral Daily  . DISCONTD: carvedilol  3.125 mg Oral BID WC    Infusions:    . DISCONTD: milrinone 0.125 mcg/kg/min (09/17/11 0703)    PRN Medications: sodium chloride, sodium chloride, acetaminophen, acetaminophen, albuterol, calcium carbonate, docusate sodium, nitroGLYCERIN, ondansetron (ZOFRAN) IV, oxyCODONE-acetaminophen, sodium chloride, traMADol   Assessment:  1. Cardiogenic shock with elevated filling pressure 2. A/C biventricular heart failure with low output and cardiogenic shock (primary diagnosis) 3. 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 severe Russell and TR,  5.Severe pulmonary hypertension.  6. Cardiorenal syndrome  7. A/C renal failure, likely multifactorial including cardiorenal syndrome  8. SVT/AVNRT    -- S/P SVT ablation 9. Hypertension  10. Hypomagnesemia/hyponatremia 11. Ongoing Tobacco abuse  12. Remote h/o cocaine and Marijuana abuse   Plan/Discussion:   Volume status remains stable. CO-OX 72.6. Will repeat. If CO-OX > 60 will d/c swan sleeve. Stop spironolactone. Potassium continues to trend up. Repeat ECHO pending. Discussed daily weights, low salt diet, and limiting fluid intake to < 2 liters per day. Would recommend Lasix 20 mg daily at discharge for weight >135 pounds.   S/P SVT  ablation. No further tachycardia over night. Maintaining SR. Dr Gala Romney discussed with Dr Ladona Ridgel and he recommends BI-VI if tachycardic reoccurs today. Would need BI-VI prior to discharge.   Continue to mobilize.  Refused Home Health.  CLEGG,AMY,NP-C 7:46 AM  Patient seen and examined with Tonye Becket, NP. We discussed all aspects of the encounter. I agree with the assessment and plan as stated above.   Looks very good. Tele reviewed still with multiple episodes of recurrent SVT - most short. Peak HR ~100. Will likely need attempt at "touch-up" ablation though this will come with risk of needing a pacer and would have to place CRT-D however given pre-existing LV dysfunction this is not unreasonable.   I discussed this with him and his wife. He is willing to proceed. Will likely be done Monday. Will transfer to tele. Plan tentative repeat ablation Monday. Titrate HF meds as tolerated. Check f/u echo.  [name] 2:11  PM

## 2011-09-19 LAB — BASIC METABOLIC PANEL
BUN: 30 mg/dL — ABNORMAL HIGH (ref 6–23)
CO2: 24 mEq/L (ref 19–32)
Calcium: 9.7 mg/dL (ref 8.4–10.5)
Chloride: 98 mEq/L (ref 96–112)
Creatinine, Ser: 1.33 mg/dL (ref 0.50–1.35)
Glucose, Bld: 88 mg/dL (ref 70–99)

## 2011-09-19 MED ORDER — LISINOPRIL 5 MG PO TABS
5.0000 mg | ORAL_TABLET | Freq: Every day | ORAL | Status: DC
  Administered 2011-09-19 – 2011-09-22 (×4): 5 mg via ORAL
  Filled 2011-09-19 (×4): qty 1

## 2011-09-19 NOTE — Significant Event (Signed)
Patient walked the hall twice on way back to his room 4715 he experienced c/p lasting a few minutes . vss 129/78 hr 79, EKG showed no change patient states feeling well now. Dr Sharyn Lull call and informed of patient status.

## 2011-09-19 NOTE — Progress Notes (Signed)
Subjective:  Patient denies any chest pain or shortness of breath. Complains of occasional palpitations now. Schedule for SVT ablation on Monday  Objective:  Vital Signs in the last 24 hours: Temp:  [97.3 F (36.3 C)-98.5 F (36.9 C)] 98.2 F (36.8 C) (08/03 0518) Pulse Rate:  [69-79] 75  (08/03 0518) Resp:  [18-20] 18  (08/03 0518) BP: (100-124)/(58-84) 124/75 mmHg (08/03 0518) SpO2:  [96 %-100 %] 96 % (08/03 0518) Weight:  [55.021 kg (121 lb 4.8 oz)-59.6 kg (131 lb 6.3 oz)] 55.021 kg (121 lb 4.8 oz) (08/03 0518)  Intake/Output from previous day: 08/02 0701 - 08/03 0700 In: 340 [P.O.:240; I.V.:100] Out: 2225 [Urine:2225] Intake/Output from this shift: Total I/O In: 120 [P.O.:120] Out: 775 [Urine:775]  Physical Exam: Neck: no adenopathy, no carotid bruit, no JVD and supple, symmetrical, trachea midline Lungs: Decreased breath sound at bases Heart: regular rate and rhythm, S1, S2 normal and 2/6 systolic murmur noted Abdomen: soft, non-tender; bowel sounds normal; no masses,  no organomegaly Extremities: extremities normal, atraumatic, no cyanosis or edema  Lab Results: No results found for this basename: WBC:2,HGB:2,PLT:2 in the last 72 hours  Basename 09/19/11 0603 09/18/11 0500  NA 131* 134*  K 4.1 5.0  CL 98 100  CO2 24 27  GLUCOSE 88 100*  BUN 30* 28*  CREATININE 1.33 1.33   No results found for this basename: TROPONINI:2,CK,MB:2 in the last 72 hours Hepatic Function Panel No results found for this basename: PROT,ALBUMIN,AST,ALT,ALKPHOS,BILITOT,BILIDIR,IBILI in the last 72 hours No results found for this basename: CHOL in the last 72 hours No results found for this basename: PROTIME in the last 72 hours  Imaging: Imaging results have been reviewed and No results found.  Cardiac Studies:  Assessment/Plan:  Compensated biventricular systolic heart failure Nonischemic dilated cardiomyopathy Valvular heart disease Status post recurrent SVT status post SVT  ablation Pulmonary hypertension Chronic kidney disease stage II Tobacco abuse History of cocaine and marijuana abuse in the past Plan Continue present management scheduled for SVT ablation on Monday May need by V. ICD if ejection fraction remains below 35%  LOS: 11 days    Diyari Cherne N 09/19/2011, 11:55 AM

## 2011-09-19 NOTE — Progress Notes (Signed)
Advanced Heart Failure Rounding Note  Referring Physician: Dr. Sherryl Manges  Primary Physician: Dr. [name]  Primary Cardiologist: Dr. [name]   Weight range: 140-144 lbs. Discharge weight 149 lbs on 08/31/11  Reason for Consultation: NICM with Biventricular failure with Pro BNP 19404 (7192 one week ago)   Subjective:    [name] is a 61 year old man with NICM admitted with biventricular HF  (NICM diagnosed in 1997 with EF 25-30%) and shock in the setting of SVT  2 D echo in 08/2011: LVEF 25-30% diffuse hypokinesis, severe Russell, severe TR, RV moderately to severely reduced. PA 57.    Underwent RHC last week with shock physiology. IABP placed and started on milrinone. Initial cath numbers below. Subsequently underwent AVNRT ablation with Dr. Ladona Ridgel. Now IABP out and off inotropes. RA = 17  RV = 87/14/18  PA = 87/56 (69)  PCW = 31  Fick cardiac output/index = 2.1/1.2  PVR = 12.6 Woods  FA sat = 98%  PA sat = 28%, 28   Feels good. Ambulating without problem. Denies SOB/PND/Orthopnea.   Telemetry: Sinus with frequent runs SVT.    Objective:   Weight Range:  Vital Signs:   Temp:  [97.3 F (36.3 C)-98.5 F (36.9 C)] 98.2 F (36.8 C) (08/03 0518) Pulse Rate:  [69-79] 75  (08/03 0518) Resp:  [17-20] 18  (08/03 0518) BP: (100-124)/(58-84) 124/75 mmHg (08/03 0518) SpO2:  [96 %-100 %] 96 % (08/03 0518) Weight:  [55.021 kg (121 lb 4.8 oz)-59.6 kg (131 lb 6.3 oz)] 55.021 kg (121 lb 4.8 oz) (08/03 0518) Last BM Date: 09/17/11  Weight change: Filed Weights   09/18/11 1654 09/19/11 0512 09/19/11 0518  Weight: 59.6 kg (131 lb 6.3 oz) 59.512 kg (131 lb 3.2 oz) 55.021 kg (121 lb 4.8 oz)    Intake/Output:   Intake/Output Summary (Last 24 hours) at 09/19/11 0753 Last data filed at 09/19/11 0500  Gross per 24 hour  Intake    340 ml  Output   2225 ml  Net  -1885 ml     Physical Exam:  General: Improved  NAD  Neck: supple. Swan sleeve in place. Carotids 2+ bilat; no  bruits. No lymphadenopathy or thryomegaly appreciated.  Cor: PMI lateral displaced. Regula 2/6 Russell no s3 JVP 5-6 Lungs:Clear anteriorly Abdomen: soft, nontender No hepatosplenomegaly. No bruits or masses. Good bowel sounds. Extremities: no cyanosis, clubbing, rash, Neuro: alert & orientedx3, cranial nerves grossly intact. moves all 4 extremities w/o difficulty. Affect pleasant  Telemetry: SR 60-70s  Labs: Basic Metabolic Panel:  Lab 09/19/11 2130 09/18/11 0500 09/17/11 0450 09/16/11 0500 09/15/11 0436 09/14/11 0257 09/13/11 0445  NA 131* 134* 129* 132* 130* -- --  K 4.1 5.0 4.9 4.7 4.7 -- --  CL 98 100 94* 97 94* -- --  CO2 24 27 27 25 26  -- --  GLUCOSE 88 100* 98 105* 118* -- --  BUN 30* 28* 29* 26* 32* -- --  CREATININE 1.33 1.33 1.30 1.36* 1.41* -- --  CALCIUM 9.7 9.7 9.7 -- -- -- --  MG -- -- -- -- -- 1.7 1.8  PHOS -- -- -- -- -- -- --    Liver Function Tests: No results found for this basename: AST:5,ALT:5,ALKPHOS:5,BILITOT:5,PROT:5,ALBUMIN:5 in the last 168 hours No results found for this basename: LIPASE:5,AMYLASE:5 in the last 168 hours  CBC:  Lab 09/14/11 0257 09/13/11 0445  WBC 7.0 6.5  NEUTROABS -- --  HGB 13.9 13.3  HCT 38.9* 40.1  MCV 94.4 97.6  PLT 339 404*    Cardiac Enzymes: No results found for this basename: CKTOTAL:5,CKMB:5,CKMBINDEX:5,TROPONINI:5 in the last 168 hours  BNP: BNP (last 3 results)  Basename 09/08/11 0244 08/30/11 0520 08/23/11 0354  PROBNP 19404.0* 7192.0* 18397.0*     Other results: Imaging: No results found.   Medications:     Scheduled Medications:    . aspirin EC  81 mg Oral Daily  . carvedilol  6.25 mg Oral BID WC  . digoxin  0.125 mg Oral Daily  . heparin  5,000 Units Subcutaneous Q8H  . hydrALAZINE  50 mg Oral Q8H  . isosorbide mononitrate  30 mg Oral Daily  . multivitamin with minerals  1 tablet Oral Daily  . pantoprazole  40 mg Oral Daily  . sodium chloride  3 mL Intravenous Q12H  . DISCONTD:  spironolactone  12.5 mg Oral Daily    Infusions:    PRN Medications: sodium chloride, sodium chloride, acetaminophen, acetaminophen, albuterol, calcium carbonate, docusate sodium, nitroGLYCERIN, ondansetron (ZOFRAN) IV, oxyCODONE-acetaminophen, sodium chloride, traMADol   Assessment:  1. Cardiogenic shock with elevated filling pressure 2. A/C biventricular heart failure with low output and cardiogenic shock (primary diagnosis) 3. 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 severe Russell and TR,  5.Severe pulmonary hypertension.  6. Cardiorenal syndrome  7. A/C renal failure, likely multifactorial including cardiorenal syndrome  8. SVT/AVNRT    -- S/P SVT ablation 9. Hypertension  10. Hypomagnesemia/hyponatremia 11. Ongoing Tobacco abuse  12. Remote h/o cocaine and Marijuana abuse   Plan/Discussion:     Looks very good. Weight and electrolytes stable. Tele reviewed still with multiple episodes of recurrent SVT. Peak HR ~100. On schedule for SVT "touch-up" ablation on Monday. He is aware that this will come with risk of needing a pacer and would have to place CRT-D however given pre-existing LV dysfunction this is not unreasonable.   Repeat echo pending. Will add low-dose ACE-I.  Will give him a lab holiday tomorrow.   Ziyan Schoon,MD 7:53 AM

## 2011-09-20 NOTE — Progress Notes (Signed)
   EP: Dr. Graciela Husbands Primary cardiologist: Dr. Algie Coffer  Subjective: Resting. Breathing nonlabored. No chest pain.   Objective: Blood pressure 135/55, pulse 76, temperature 98.1 F (36.7 C), temperature source Oral, resp. rate 21, height 5\' 9"  (1.753 m), weight 132 lb 9.6 oz (60.147 kg), SpO2 99.00%.  Intake/Output Summary (Last 24 hours) at 09/20/11 0918 Last data filed at 09/20/11 0847  Gross per 24 hour  Intake   1300 ml  Output   1652 ml  Net   -352 ml   Telemetry -  No recurrent SVT last 24 hours.  Exam -   General - NAD.  Lungs - Clear, nonlabored.  Cardiac - RRR with 2/6 systolic murmur.  Extremities -  No pitting.   Lab Results -  Basic Metabolic Panel:  Lab 09/19/11 9604 09/18/11 0500 09/17/11 0450 09/14/11 0257  NA 131* 134* 129* --  K 4.1 5.0 4.9 --  CL 98 100 94* --  CO2 24 27 27  --  GLUCOSE 88 100* 98 --  BUN 30* 28* 29* --  CREATININE 1.33 1.33 1.30 --  CALCIUM 9.7 9.7 9.7 --  MG -- -- -- 1.7    CBC:  Lab 09/14/11 0257  WBC 7.0  HGB 13.9  HCT 38.9*  MCV 94.4  PLT 339    Current Medications    . aspirin EC  81 mg Oral Daily  . carvedilol  6.25 mg Oral BID WC  . digoxin  0.125 mg Oral Daily  . heparin  5,000 Units Subcutaneous Q8H  . hydrALAZINE  50 mg Oral Q8H  . isosorbide mononitrate  30 mg Oral Daily  . lisinopril  5 mg Oral Daily  . multivitamin with minerals  1 tablet Oral Daily  . pantoprazole  40 mg Oral Daily  . sodium chloride  3 mL Intravenous Q12H    Assessment:  1. A/C biventricular heart failure with low output and cardiogenic shock (primary diagnosis)  Improved.  2. Dilated NICM with EF 25-30%   3. Severe MR and TR.   4.Severe pulmonary hypertension.   5. Cardiorenal syndrome   6. SVT/AVNRT  -- S/P SVT ablation   Plan:  Patient for repeat SVT ablation first of the week. He is aware that this will come with risk of needing a pacer and would have to place CRT-D however given pre-existing LV dysfunction this  is not unreasonable. Continue medical therapy. CHF team to followup tomorrow.   Jonelle Sidle, M.D., F.A.C.C.

## 2011-09-20 NOTE — Progress Notes (Signed)
Subjective:  Doing well denies any chest pain shortness of breath or further palpitations  Objective:  Vital Signs in the last 24 hours: Temp:  [98.1 F (36.7 C)-98.7 F (37.1 C)] 98.1 F (36.7 C) (08/04 0509) Pulse Rate:  [74-76] 76  (08/04 0509) Resp:  [20-21] 21  (08/04 0509) BP: (106-135)/(55-76) 135/55 mmHg (08/04 0509) SpO2:  [98 %-99 %] 99 % (08/04 0509) Weight:  [60.147 kg (132 lb 9.6 oz)] 60.147 kg (132 lb 9.6 oz) (08/04 0509)  Intake/Output from previous day: 08/03 0701 - 08/04 0700 In: 1060 [P.O.:1060] Out: 1577 [Urine:1575; Stool:2] Intake/Output from this shift: Total I/O In: 360 [P.O.:360] Out: 400 [Urine:400]  Physical Exam: Neck: no adenopathy, no carotid bruit, no JVD and supple, symmetrical, trachea midline Lungs: clear to auscultation bilaterally Heart: regular rate and rhythm, S1, S2 normal and 2/6 systolic murmur noted Abdomen: soft, non-tender; bowel sounds normal; no masses,  no organomegaly Extremities: extremities normal, atraumatic, no cyanosis or edema  Lab Results: No results found for this basename: WBC:2,HGB:2,PLT:2 in the last 72 hours  Basename 09/19/11 0603 09/18/11 0500  NA 131* 134*  K 4.1 5.0  CL 98 100  CO2 24 27  GLUCOSE 88 100*  BUN 30* 28*  CREATININE 1.33 1.33   No results found for this basename: TROPONINI:2,CK,MB:2 in the last 72 hours Hepatic Function Panel No results found for this basename: PROT,ALBUMIN,AST,ALT,ALKPHOS,BILITOT,BILIDIR,IBILI in the last 72 hours No results found for this basename: CHOL in the last 72 hours No results found for this basename: PROTIME in the last 72 hours  Imaging: Imaging results have been reviewed and No results found.  Cardiac Studies:  Assessment/Plan:  Compensated biventricular systolic heart failure  Nonischemic dilated cardiomyopathy  Valvular heart disease  Status post recurrent SVT status post SVT ablation  Pulmonary hypertension  Chronic kidney disease stage II  Tobacco  abuse  History of cocaine and marijuana abuse in the past  Plan  Continue present management scheduled for SVT ablation on Monday May need by V. ICD if ejection fraction remains below 35%   LOS: 12 days    Sabel Hornbeck N 09/20/2011, 9:50 AM

## 2011-09-21 ENCOUNTER — Encounter (HOSPITAL_COMMUNITY)
Admission: AD | Disposition: A | Discharge status: Home / Self Care | Payer: Self-pay | Source: Home / Self Care | Attending: Cardiovascular Disease

## 2011-09-21 ENCOUNTER — Ambulatory Visit (HOSPITAL_COMMUNITY): Admission: RE | Admit: 2011-09-21 | Payer: Medicare Other | Source: Ambulatory Visit | Admitting: Internal Medicine

## 2011-09-21 DIAGNOSIS — I471 Supraventricular tachycardia: Secondary | ICD-10-CM

## 2011-09-21 HISTORY — PX: SUPRAVENTRICULAR TACHYCARDIA ABLATION: SHX5492

## 2011-09-21 LAB — BASIC METABOLIC PANEL
BUN: 26 mg/dL — ABNORMAL HIGH (ref 6–23)
CO2: 22 mEq/L (ref 19–32)
Calcium: 9.2 mg/dL (ref 8.4–10.5)
Creatinine, Ser: 1.34 mg/dL (ref 0.50–1.35)
GFR calc non Af Amer: 56 mL/min — ABNORMAL LOW (ref >90)
Glucose, Bld: 80 mg/dL (ref 70–99)
Sodium: 134 mEq/L — ABNORMAL LOW (ref 135–145)

## 2011-09-21 SURGERY — SUPRAVENTRICULAR TACHYCARDIA ABLATION
Anesthesia: LOCAL

## 2011-09-21 MED ORDER — ONDANSETRON HCL 4 MG/2ML IJ SOLN: 4.0000 mg | Freq: Four times a day (QID) | INTRAMUSCULAR | Status: DC | PRN

## 2011-09-21 MED ORDER — MIDAZOLAM HCL 5 MG/5ML IJ SOLN
INTRAMUSCULAR | Status: AC
  Filled 2011-09-21: qty 5

## 2011-09-21 MED ORDER — HYDROXYUREA 500 MG PO CAPS
ORAL_CAPSULE | ORAL | Status: AC
  Filled 2011-09-21: qty 1

## 2011-09-21 MED ORDER — HEPARIN (PORCINE) IN NACL 2-0.9 UNIT/ML-% IJ SOLN
INTRAMUSCULAR | Status: AC
  Filled 2011-09-21: qty 1000

## 2011-09-21 MED ORDER — SODIUM CHLORIDE 0.9 % IV SOLN: 250.0000 mL | INTRAVENOUS | Status: DC | PRN

## 2011-09-21 MED ORDER — SODIUM CHLORIDE 0.9 % IJ SOLN
3.0000 mL | Freq: Two times a day (BID) | INTRAMUSCULAR | Status: DC
  Administered 2011-09-22: 3 mL via INTRAVENOUS

## 2011-09-21 MED ORDER — FUROSEMIDE 40 MG PO TABS
40.0000 mg | ORAL_TABLET | Freq: Every day | ORAL | Status: DC
Start: 2011-09-21 — End: 2011-09-22
  Administered 2011-09-21 – 2011-09-22 (×2): 40 mg via ORAL
  Filled 2011-09-21 (×2): qty 1

## 2011-09-21 MED ORDER — ACETAMINOPHEN 325 MG PO TABS: 650.0000 mg | ORAL_TABLET | ORAL | Status: DC | PRN

## 2011-09-21 MED ORDER — SODIUM CHLORIDE 0.9 % IJ SOLN: 3.0000 mL | INTRAMUSCULAR | Status: DC | PRN

## 2011-09-21 MED ORDER — FENTANYL CITRATE 0.05 MG/ML IJ SOLN
INTRAMUSCULAR | Status: AC
  Filled 2011-09-21: qty 2

## 2011-09-21 MED ORDER — BUPIVACAINE HCL (PF) 0.25 % IJ SOLN
INTRAMUSCULAR | Status: AC
  Filled 2011-09-21: qty 60

## 2011-09-21 NOTE — Progress Notes (Signed)
Called MD regarding NPO with meds, orders given to hold meds for patient going for Cardiac ablation. Will continue to monitor.

## 2011-09-21 NOTE — Op Note (Signed)
EPS/RFS of atrial tachycardia performed without immediate complication. J#[id number].

## 2011-09-21 NOTE — Progress Notes (Signed)
Patient transferred via hospital bed to cath lab for EP SVT ablation per MD order. Consent form signed. Will be alert for status of patient upon return to floor.

## 2011-09-21 NOTE — Progress Notes (Signed)
Advanced Heart Failure Rounding Note  Referring Physician: Dr. Sherryl Manges  Primary Physician: Dr. [name]  Primary Cardiologist: Dr. [name]   Weight range: 140-144 lbs. Discharge weight 149 lbs on 08/31/11  Reason for Consultation: NICM with Biventricular failure with Pro BNP 19404 (7192 one week ago)   Subjective:    [name] is a 61 year old man with NICM admitted with biventricular HF  (NICM diagnosed in 1997 with EF 25-30%) and shock in the setting of SVT  2 D echo in 08/2011: LVEF 25-30% diffuse hypokinesis, severe MR, severe TR, RV moderately to severely reduced. PA 57.    Underwent RHC on admit with shock physiology. IABP placed and started on milrinone. Initial cath numbers below. Subsequently underwent AVNRT ablation with Dr. Ladona Ridgel. Now IABP out and off inotropes. RA = 17  RV = 87/14/18  PA = 87/56 (69)  PCW = 31  Fick cardiac output/index = 2.1/1.2  PVR = 12.6 Woods  FA sat = 98%  PA sat = 28%, 28   Feels good. Ambulating without problem. Denies SOB/PND/Orthopnea. EP looking at for touch up ablation.    Telemetry: Sinus. No ectopy noted   Objective:   Weight Range:  Vital Signs:   Temp:  [98 F (36.7 C)-98.6 F (37 C)] 98.6 F (37 C) (08/05 0459) Pulse Rate:  [65-75] 65  (08/05 0459) Resp:  [18-20] 18  (08/05 0459) BP: (96-112)/(57-78) 108/78 mmHg (08/05 0459) SpO2:  [98 %] 98 % (08/05 0459) Weight:  [61.4 kg (135 lb 5.8 oz)] 61.4 kg (135 lb 5.8 oz) (08/05 0459) Last BM Date: 09/20/11  Weight change: Filed Weights   09/19/11 0512 09/20/11 0509 09/21/11 0459  Weight: 59.512 kg (131 lb 3.2 oz) 60.147 kg (132 lb 9.6 oz) 61.4 kg (135 lb 5.8 oz)    Intake/Output:   Intake/Output Summary (Last 24 hours) at 09/21/11 0844 Last data filed at 09/21/11 0830  Gross per 24 hour  Intake   1080 ml  Output   1750 ml  Net   -670 ml     Physical Exam:  General: Improved  NAD  Neck: supple. Carotids 2+ bilat; no bruits. No lymphadenopathy or  thryomegaly appreciated.  Cor: PMI lateral displaced. Regula 2/6 MR no s3 JVP 5-6 Lungs:Clear anteriorly Abdomen: soft, nontender No hepatosplenomegaly. No bruits or masses. Good bowel sounds. Extremities: no cyanosis, clubbing, rash, Neuro: alert & orientedx3, cranial nerves grossly intact. moves all 4 extremities w/o difficulty. Affect pleasant  Telemetry: SR 60-90s no ectopy  Labs: Basic Metabolic Panel:  Lab 09/21/11 4696 09/19/11 0603 09/18/11 0500 09/17/11 0450 09/16/11 0500  NA 134* 131* 134* 129* 132*  K 4.5 4.1 5.0 4.9 4.7  CL 102 98 100 94* 97  CO2 22 24 27 27 25   GLUCOSE 80 88 100* 98 105*  BUN 26* 30* 28* 29* 26*  CREATININE 1.34 1.33 1.33 1.30 1.36*  CALCIUM 9.2 9.7 9.7 -- --  MG -- -- -- -- --  PHOS -- -- -- -- --    Liver Function Tests: No results found for this basename: AST:5,ALT:5,ALKPHOS:5,BILITOT:5,PROT:5,ALBUMIN:5 in the last 168 hours No results found for this basename: LIPASE:5,AMYLASE:5 in the last 168 hours  CBC: No results found for this basename: WBC:5,NEUTROABS:5,HGB:5,HCT:5,MCV:5,PLT:5 in the last 168 hours  Cardiac Enzymes: No results found for this basename: CKTOTAL:5,CKMB:5,CKMBINDEX:5,TROPONINI:5 in the last 168 hours  BNP: BNP (last 3 results)  Basename 09/08/11 0244 08/30/11 0520 08/23/11 0354  PROBNP 19404.0* 7192.0* 18397.0*  Other results: Imaging: No results found.   Medications:     Scheduled Medications:    . aspirin EC  81 mg Oral Daily  . carvedilol  6.25 mg Oral BID WC  . digoxin  0.125 mg Oral Daily  . heparin  5,000 Units Subcutaneous Q8H  . hydrALAZINE  50 mg Oral Q8H  . isosorbide mononitrate  30 mg Oral Daily  . lisinopril  5 mg Oral Daily  . multivitamin with minerals  1 tablet Oral Daily  . pantoprazole  40 mg Oral Daily  . sodium chloride  3 mL Intravenous Q12H    Infusions:    PRN Medications: sodium chloride, sodium chloride, acetaminophen, acetaminophen, albuterol, calcium carbonate,  docusate sodium, nitroGLYCERIN, ondansetron (ZOFRAN) IV, oxyCODONE-acetaminophen, sodium chloride, traMADol   Assessment:  1. Cardiogenic shock with elevated filling pressure 2. A/C biventricular heart failure with low output and cardiogenic shock (primary diagnosis) 3. 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 severe MR and TR,  5.Severe pulmonary hypertension.  6. Cardiorenal syndrome  7. A/C renal failure, likely multifactorial including cardiorenal syndrome  8. SVT/AVNRT    -- S/P SVT ablation 9. Hypertension  10. Hypomagnesemia/hyponatremia 11. Ongoing Tobacco abuse  12. Remote h/o cocaine and Marijuana abuse   Plan/Discussion:     Looks very good. Electrolytes stable. Weight increasing slowly of lasix. Will add lasix 40 daily.  No ectopy seen today on tele was having a high burden several days ago. EP to see later.  Await their impression. I favor relook if they feel they can help him as I think his tachycardia was a big part of his HF presentation. If no repeat ablation likely home today or tomorrow am. Had repeat echo over weekend. I will look at.   Reuel Boom Bensimhon,MD 8:44 AM

## 2011-09-21 NOTE — Interval H&P Note (Signed)
History and Physical Interval Note:Since his prior ablation, he has had recurrent tachypalpitations and what appears to be a 1:2 phenomena along with NSSVT. I suspect he has some residual slow pathway conduction and because he gets very sick with SVT in conjunction with his underlying cardiomyopathy, I have recommended repeat EPS/RFA. He is at risk for developing high grade heart block which would result in the need for BiV ICD with his underlying chronic systolic heart failure and chronic severe LV dysfunction.   09/21/2011 10:54 AM  [name]  has presented today for surgery, with the diagnosis of SVT  The various methods of treatment have been discussed with the patient and family. After consideration of risks, benefits and other options for treatment, the patient has consented to  Procedure(s) (LRB): SUPRAVENTRICULAR TACHYCARDIA ABLATION (N/A) as a surgical intervention .  The patient's history has been reviewed, patient examined, no change in status, stable for surgery.  I have reviewed the patient's chart and labs.  Questions were answered to the patient's satisfaction.     Buel Ream.D.

## 2011-09-21 NOTE — Progress Notes (Signed)
Patient transferred to back to 4715 from cath lab. Oriented patient back to room. VSS and patient currently complains of no pain or discomfort, only to reposition patient and to eat. Right groin level zero and Intrajugular site level zero also. Heart healthy diet order was put in for patient is ready to eat. Will continue to monitor.

## 2011-09-22 LAB — BASIC METABOLIC PANEL
BUN: 24 mg/dL — ABNORMAL HIGH (ref 6–23)
Chloride: 102 mEq/L (ref 96–112)
GFR calc Af Amer: 71 mL/min — ABNORMAL LOW (ref >90)
Glucose, Bld: 83 mg/dL (ref 70–99)
Potassium: 4.5 mEq/L (ref 3.5–5.1)
Sodium: 134 mEq/L — ABNORMAL LOW (ref 135–145)

## 2011-09-22 MED ORDER — CARVEDILOL 6.25 MG PO TABS: 6.2500 mg | ORAL_TABLET | Freq: Two times a day (BID) | ORAL | Status: DC

## 2011-09-22 MED ORDER — LISINOPRIL 5 MG PO TABS: 5.0000 mg | ORAL_TABLET | Freq: Every day | ORAL | Status: DC

## 2011-09-22 NOTE — Progress Notes (Signed)
Discharge instructions and prescriptions given to pt .Verbalized understanding. To lobby by Nt  witj spouse in attendance

## 2011-09-22 NOTE — Op Note (Signed)
[name], [name]                 ACCOUNT NO.:  [id number]  MEDICAL RECORD NO.:  [id number]  LOCATION:  4715                         FACILITY:  [facility]  PHYSICIAN:  Doylene Canning. Ladona Ridgel, MD    DATE OF BIRTH:  06/19/1950  DATE OF PROCEDURE:  09/21/2011 DATE OF DISCHARGE:                              OPERATIVE REPORT   PROCEDURE PERFORMED:  Electrophysiologic study and radiofrequency catheter ablation of atrial tachycardia.  INTRODUCTION:  The patient is a very pleasant 61 year old man with a longstanding nonischemic cardiomyopathy.  He developed incessant SVT and underwent electrophysiologic study and catheter ablation of AV node reentrant tachycardia approximately 1 week ago.  He did well for several days, but following ablation, the patient had recurrent tachycardia. This was not her regular tachycardia with the relationship that the P- waves and QRS were quite unusual and it appeared there is 1 P-wave for 2 QRS complexes with heart rates in the 105-110 beats per minute range. The concern was that he had residual slow pathway conduction resulting in a 1/2 phenomena and was referred now for redo catheter ablation.  PROCEDURE:  After informed consent was obtained, the patient was taken to the diagnostic EP lab in a fasting state.  After usual preparation and draping, intravenous fentanyl and midazolam was given for sedation. A 6-French hexapolar catheter was inserted percutaneously into the right jugular vein and advanced to the coronary sinus.  A 6-French quadripolar catheter was inserted percutaneously in the right femoral vein and advanced to the right ventricle.  A 6-French quadripolar catheter was inserted percutaneously in the right femoral vein and advanced to the His bundle region.  After measurement of the basic intervals, rapid ventricular pacing was carried out from the right ventricle demonstrating VA dissociation at 700 milliseconds.  Programmed ventricular stimulation was  carried out also demonstrating VA dissociation at 700 milliseconds.  Next, programmed atrial stimulation was carried out from the atrium at a base drive cycle length of 409 msec.  The S1-S2 interval stepwise decreased down to 330 msec where the AV node ERP was observed.  During programmed atrial stimulation, there were no AH jumps, no echo beats, and no inducible SVT.  Next, rapid atrial pacing was carried out from the atrium at a base drive cycle length of 811 msec with stepwise decrease to 470 msec where AV Wenckebach was observed.  During rapid atrial pacing, the PR interval was equal to the RR interval and there was no inducible SVT.  Next, isoproterenol was infused at rates between 1-4 mcg per minute. Additional rapid atrial pacing was carried out, and during programmed atrial stimulation, the PR interval was greater than the RR interval. Because of the concern of residual slow pathway conduction, a 7-French quadripolar ablation catheter was maneuvered by the way of the right femoral vein into Koch's triangle.  Mapping was carried out.  RF energy application was delivered progressively higher in Koch's triangle.  When junctional rhythm developed with retrograde block, RF was immediately discontinued.  At this point, there were still persistent AV conduction, however, there was also persistence of the PR interval being greater than the RR interval.  Additional mapping was carried out, and RF  energy was applied in a more posterior location low in Koch's triangle just above the coronary sinus ostium.  Accelerated junctional rhythm was demonstrated, which persisted over a long period of time.  At this point, isoproterenol was re-initiated and additional rapid atrial pacing was carried out resulting in the initiation of a second tachycardia. This was atrial tachycardia based on the finding of AV block during tachycardia.  Mapping was carried out demonstrating the earliest  atrial activation of his atrial tachycardia to be along the tricuspid annulus at a location approximately 5 o'clock in the LAO projection.  2 RF energy applications were delivered to this area resulting in termination of the tachycardia.  Following ablation, additional rapid atrial pacing was carried out with isoproterenol, and there was no inducible SVT.  At this point, the patient was observed for approximately 20 minutes and had no recurrent evidence of residual slow pathway conduction with a PR interval being less than the RR interval, and no inducible SVT.  The catheters were then removed.  Hemostasis was assured, and the patient was returned to his room in satisfactory condition.  COMPLICATIONS:  There were no immediate procedure complications.  RESULTS:  A.  Baseline ECG.  Baseline ECG demonstrates normal sinus rhythm with intraventricular conduction delay.  QRS duration was 140 msec. B.  Baseline intervals.  QRS duration was 140 msec.  HV interval was 44 msec.  AH interval was 104 msec. C.  Rapid ventricular pacing.  Rapid ventricular pacing was carried out from the right ventricle demonstrating VA dissociation. D.  Programmed ventricular stimulation.  Programmed ventricular stimulation was carried out from the right ventricle demonstrating VA dissociation. E.  Rapid atrial pacing.  Rapid atrial pacing was carried out from the atrium at a pacing cycle length of 600 msec and stepwise decrease down to 470 msec where AV Wenckebach was demonstrated.  On isoproterenol, rapid atrial pacing resulted in the initiation of SVT. F.  Programmed atrial stimulation.  Programmed atrial stimulation was carried out from the coronary sinus demonstrating minimal residual dual AV nodal physiology with PR interval being greater than the RR interval, and rare echo beats.  Following ablation, there were no residual slow pathway conduction.  The PR interval was less than the RR interval. G.   Arrhythmias observed: 1. Atrial tachycardia.  Initiation was with rapid atrial pacing on     isoproterenol.  Duration was sustained, cycle length was 370     milliseconds.  Method of termination was with catheter ablation.     a.     Mapping.  Mapping of the patient's atrial tachycardia      demonstrated initiation low in the right atrium at 5 o'clock on      the tricuspid valve annulus just between the inferior vena cava      and the annulus.     b.     RF energy application.  A total of 14 RF energy applications      were delivered both to the slow pathway region as well as to the      region around the tricuspid valve annulus.  Following catheter      ablation, there were no inducible arrhythmias.  CONCLUSIONS:  This study demonstrates successful electrophysiology study and catheter ablation of residual slow pathway as well as successful ablation of atrial tachycardia originating from the right atrium just posterior to the tricuspid valve annulus.  Successful ablation was accomplished in both locations rendering the tachycardia noninducible and no residual slow pathway  conduction.     Doylene Canning. Ladona Ridgel, MD     GWT/MEDQ  D:  09/21/2011  T:  09/22/2011  Job:  [id number]  cc:   Bevelyn Buckles. Bensimhon, MD

## 2011-09-22 NOTE — Progress Notes (Signed)
Subjective:  No complaints. Afebrile.  Objective:  Vital Signs in the last 24 hours: Temp:  [98.4 F (36.9 C)-98.7 F (37.1 C)] 98.7 F (37.1 C) (08/06 0634) Pulse Rate:  [65-81] 68  (08/06 0634) Cardiac Rhythm:  [-] Normal sinus rhythm (08/05 2115) BP: (101-146)/(59-98) 111/71 mmHg (08/06 0634) SpO2:  [97 %-100 %] 98 % (08/06 0634) Weight:  [60.102 kg (132 lb 8 oz)] 60.102 kg (132 lb 8 oz) (08/06 1610)  Physical Exam: BP Readings from Last 1 Encounters:  09/22/11 111/71    Wt Readings from Last 1 Encounters:  09/22/11 60.102 kg (132 lb 8 oz)    Weight change: -1.298 kg (-2 lb 13.8 oz)  HEENT: [place]/AT, Eyes-Brown, PERL, EOMI, Conjunctiva-Pink, Sclera-Non-icteric Neck: No JVD, No bruit, Trachea midline. Lungs:  Clear, Bilateral. Cardiac:  Regular rhythm, normal S1 and S2, no S3.  Abdomen:  Soft, non-tender. Extremities:  No edema present. No cyanosis. No clubbing. CNS: AxOx3, Cranial nerves grossly intact, moves all 4 extremities. Right handed. Skin: Warm and dry.   Intake/Output from previous day: 08/05 0701 - 08/06 0700 In: 580 [P.O.:580] Out: 3326 [Urine:3325; Stool:1]    Lab Results: BMET    Component Value Date/Time   NA 134* 09/22/2011 0540   K 4.5 09/22/2011 0540   CL 102 09/22/2011 0540   CO2 24 09/22/2011 0540   GLUCOSE 83 09/22/2011 0540   BUN 24* 09/22/2011 0540   CREATININE 1.24 09/22/2011 0540   CALCIUM 9.3 09/22/2011 0540   GFRNONAA 61* 09/22/2011 0540   GFRAA 71* 09/22/2011 0540   CBC    Component Value Date/Time   WBC 7.0 09/14/2011 0257   RBC 4.12* 09/14/2011 0257   HGB 13.9 09/14/2011 0257   HCT 38.9* 09/14/2011 0257   PLT 339 09/14/2011 0257   MCV 94.4 09/14/2011 0257   MCH 33.7 09/14/2011 0257   MCHC 35.7 09/14/2011 0257   RDW 13.3 09/14/2011 0257   LYMPHSABS 1.2 11/10/2010 1019   MONOABS 0.7 11/10/2010 1019   EOSABS 0.3 11/10/2010 1019   BASOSABS 0.1 11/10/2010 1019   CARDIAC ENZYMES Lab Results  Component Value Date   CKTOTAL 65 09/08/2011   CKMB 3.5  09/08/2011   TROPONINI <0.30 09/08/2011    Assessment/Plan:  Patient Active Hospital Problem List: 1. Cardiogenic shock with elevated filling pressure  2. A/C biventricular heart failure with low output and cardiogenic shock (primary diagnosis)  3. 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 Severe MR and TR,  5. Severe pulmonary hypertension.  6. Cardiorenal syndrome  7. A/C renal failure, likely multifactorial including cardiorenal syndrome  8. SVT/AVNRT       -- S/P SVT ablation x 2  9. Hypertension  10. Hypomagnesemia/hyponatremia  11. Ongoing Tobacco abuse  12. Remote h/o cocaine and Marijuana abuse  Home soon. Increase activity. Bi-V ICD in near future.   LOS: 14 days    [name]  MD  09/22/2011, 8:31 AM

## 2011-09-22 NOTE — Progress Notes (Signed)
Advanced Heart Failure Rounding Note  Referring Physician: Dr. Sherryl Manges  Primary Physician: Dr. [name]  Primary Cardiologist: Dr. [name]   Weight range: 140-144 lbs. Discharge weight 149 lbs on 08/31/11  Reason for Consultation: NICM with Biventricular failure with Pro BNP 19404 (7192 one week ago)   Subjective:    [name] is a 61 year old man with NICM admitted with biventricular HF  (NICM diagnosed in 1997 with EF 25-30%) and shock in the setting of SVT  2 D echo in 08/2011: LVEF 25-30% diffuse hypokinesis, severe Russell, severe TR, RV moderately to severely reduced. PA 57.    Underwent RHC on admit with shock physiology. IABP placed and started on milrinone. Initial cath numbers below. Subsequently underwent AVNRT ablation with Dr. Ladona Ridgel. Now IABP out and off inotropes. RA = 17  RV = 87/14/18  PA = 87/56 (69)  PCW = 31  Fick cardiac output/index = 2.1/1.2  PVR = 12.6 Woods  FA sat = 98%  PA sat = 28%, 28  Underwent successful RFA of atrial tachycardia yesterday by Dr. Ladona Ridgel.    Feels good, ready to go home. Ambulating without problem. Denies SOB/PND/Orthopnea.   Telemetry: Sinus.   Objective:   Weight Range:  Vital Signs:   Temp:  [98.4 F (36.9 C)-98.7 F (37.1 C)] 98.7 F (37.1 C) (08/06 0634) Pulse Rate:  [65-81] 68  (08/06 0634) BP: (101-146)/(59-98) 111/71 mmHg (08/06 0634) SpO2:  [97 %-100 %] 98 % (08/06 0634) Weight:  [60.102 kg (132 lb 8 oz)] 60.102 kg (132 lb 8 oz) (08/06 0634) Last BM Date: 09/21/11  Weight change: Filed Weights   09/20/11 0509 09/21/11 0459 09/22/11 0634  Weight: 60.147 kg (132 lb 9.6 oz) 61.4 kg (135 lb 5.8 oz) 60.102 kg (132 lb 8 oz)    Intake/Output:   Intake/Output Summary (Last 24 hours) at 09/22/11 0847 Last data filed at 09/22/11 0814  Gross per 24 hour  Intake    820 ml  Output   3176 ml  Net  -2356 ml     Physical Exam:  General: Improved  NAD  Neck: supple. Carotids 2+ bilat; no bruits. No  lymphadenopathy or thryomegaly appreciated.  Cor: PMI lateral displaced. Regula 2/6 Russell no s3 JVP 5-6 Lungs:Clear anteriorly Abdomen: soft, nontender No hepatosplenomegaly. No bruits or masses. Good bowel sounds. Extremities: no cyanosis, clubbing, rash, Neuro: alert & orientedx3, cranial nerves grossly intact. moves all 4 extremities w/o difficulty. Affect pleasant  Telemetry: SR 60-90s no ectopy  Labs: Basic Metabolic Panel:  Lab 09/22/11 5409 09/21/11 0624 09/19/11 0603 09/18/11 0500 09/17/11 0450  NA 134* 134* 131* 134* 129*  K 4.5 4.5 4.1 5.0 4.9  CL 102 102 98 100 94*  CO2 24 22 24 27 27   GLUCOSE 83 80 88 100* 98  BUN 24* 26* 30* 28* 29*  CREATININE 1.24 1.34 1.33 1.33 1.30  CALCIUM 9.3 9.2 9.7 -- --  MG -- -- -- -- --  PHOS -- -- -- -- --    Liver Function Tests: No results found for this basename: AST:5,ALT:5,ALKPHOS:5,BILITOT:5,PROT:5,ALBUMIN:5 in the last 168 hours No results found for this basename: LIPASE:5,AMYLASE:5 in the last 168 hours  CBC: No results found for this basename: WBC:5,NEUTROABS:5,HGB:5,HCT:5,MCV:5,PLT:5 in the last 168 hours  Cardiac Enzymes: No results found for this basename: CKTOTAL:5,CKMB:5,CKMBINDEX:5,TROPONINI:5 in the last 168 hours  BNP: BNP (last 3 results)  Basename 09/08/11 0244 08/30/11 0520 08/23/11 0354  PROBNP 19404.0* 7192.0* 18397.0*  Other results: Imaging: No results found.   Medications:     Scheduled Medications:    . aspirin EC  81 mg Oral Daily  . bupivacaine      . carvedilol  6.25 mg Oral BID WC  . digoxin  0.125 mg Oral Daily  . fentaNYL      . fentaNYL      . furosemide  40 mg Oral Daily  . heparin      . heparin      . heparin  5,000 Units Subcutaneous Q8H  . hydrALAZINE  50 mg Oral Q8H  . hydroxyurea      . isosorbide mononitrate  30 mg Oral Daily  . lisinopril  5 mg Oral Daily  . midazolam      . midazolam      . multivitamin with minerals  1 tablet Oral Daily  . pantoprazole  40 mg  Oral Daily  . sodium chloride  3 mL Intravenous Q12H  . sodium chloride  3 mL Intravenous Q12H    Infusions:    PRN Medications: sodium chloride, sodium chloride, sodium chloride, acetaminophen, albuterol, calcium carbonate, docusate sodium, nitroGLYCERIN, ondansetron (ZOFRAN) IV, oxyCODONE-acetaminophen, sodium chloride, sodium chloride, traMADol, DISCONTD: acetaminophen, DISCONTD: acetaminophen, DISCONTD: ondansetron (ZOFRAN) IV   Assessment:  1. Cardiogenic shock with elevated filling pressure 2. A/C biventricular heart failure with low output and cardiogenic shock (primary diagnosis) 3. 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 severe Russell and TR,  5.Severe pulmonary hypertension.  6. Cardiorenal syndrome  7. A/C renal failure, likely multifactorial including cardiorenal syndrome  8. SVT/AVNRT    -- S/P SVT ablation 9. Hypertension  10. Hypomagnesemia/hyponatremia 11. Ongoing Tobacco abuse  12. Remote h/o cocaine and Marijuana abuse   Plan/Discussion:    Volume status looks good today.  Cont current medications.  No spiro due to K trending up.  Will have him follow up in the HF clinic next week.  Ok for discharge today.   No ectopy on tele.  Will follow up with Dr. Ladona Ridgel in 6 weeks.   [name], [facility] 8:47 AM  Patient seen and examined with Russell Blossom, PA-C. We discussed all aspects of the encounter. I agree with the assessment and plan as stated above.    Looks good. Agree with d/c today on current meds. I spoke with Dr. Ladona Ridgel this am and he recommended being discharged with 2 week event monitor. We will arrange this. We will see him back in HF clinic very soon. Agree with Dr. Algie Coffer that he needs ICD in the near future and we will continue to work with EP on this.   Russell Forcier,MD 10:58 AM

## 2011-09-22 NOTE — Progress Notes (Signed)
Subjective:  Feeling better. NPO for possible redo-ablation for recurrent atrial tachycardia. Repeat echo with improvement in pulmonary hypertension, MR and TR.  Objective:  Vital Signs in the last 24 hours: Temp: [98 F (36.7 C)-98.6 F (37 C)] 98.6 F (37 C) (08/05 0459)  Pulse Rate: [65-75] 65 (08/05 0459)  Resp: [18-20] 18 (08/05 0459)  BP: (96-112)/(57-78) 108/78 mmHg (08/05 0459)  SpO2: [98 %] 98 % (08/05 0459)  Weight: [61.4 kg (135 lb 5.8 oz)] 61.4 kg (135 lb 5.8 oz) (08/05 0459)   Physical Exam:    HEENT: [place]/AT, Eyes-Brown, PERL, EOMI, Conjunctiva-Pink, Sclera-Non-icteric Neck: No JVD, No bruit, Trachea midline. Lungs:  Clear, Bilateral. Cardiac:  Regular rhythm, normal S1 and S2, no S3. III/Vi systolic murmur. Abdomen:  Soft, non-tender. Extremities:  No edema present. No cyanosis. No clubbing. CNS: AxOx3, Cranial nerves grossly intact, moves all 4 extremities. Right handed. Skin: Warm and dry.   Intake/Output from previous day: 08/05 0701 - 08/06 0700 In: 580 [P.O.:580] Out: 3326 [Urine:3325; Stool:1]    Lab Results: BMET    Component Value Date/Time   NA 134* 09/21/2011 0540   K 4.5 09/21/2011 0540   CL 102 09/21/2011 0540   CO2 22 09/21/2011 0540   GLUCOSE 80 09/21/2011 0540   BUN 26* 09/21/2011 0540   CREATININE 1.34 09/21/2011 0540   CALCIUM 9.2 09/21/2011 0540   CBC    Component Value Date/Time   WBC 7.0 09/14/2011 0257   RBC 4.12* 09/14/2011 0257   HGB 13.9 09/14/2011 0257   HCT 38.9* 09/14/2011 0257   PLT 339 09/14/2011 0257   MCV 94.4 09/14/2011 0257   MCH 33.7 09/14/2011 0257   MCHC 35.7 09/14/2011 0257   RDW 13.3 09/14/2011 0257   LYMPHSABS 1.2 11/10/2010 1019   MONOABS 0.7 11/10/2010 1019   EOSABS 0.3 11/10/2010 1019   BASOSABS 0.1 11/10/2010 1019   CARDIAC ENZYMES Lab Results  Component Value Date   CKTOTAL 65 09/08/2011   CKMB 3.5 09/08/2011   TROPONINI <0.30 09/08/2011    Assessment/Plan:  Patient Active Hospital Problem List: 1.  Cardiogenic shock  with elevated filling pressure  2.  A/C biventricular heart failure with low output and cardiogenic shock (primary diagnosis)  3.  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  Severe MR and TR,  5.  Severe pulmonary hypertension.  6.  Cardiorenal syndrome  7.  A/C renal failure, likely multifactorial including cardiorenal syndrome  8.  SVT/AVNRT      -- S/P SVT ablation  9.   Hypertension  10. Hypomagnesemia/hyponatremia  11. Ongoing Tobacco abuse  12. Remote h/o cocaine and Marijuana abuse  Redo Ablation   LOS: 13 days    [name]  MD  09/22/2011, 8:21 AM

## 2011-09-23 ENCOUNTER — Encounter (INDEPENDENT_AMBULATORY_CARE_PROVIDER_SITE_OTHER): Payer: Medicare Other

## 2011-09-23 DIAGNOSIS — I4891 Unspecified atrial fibrillation: Secondary | ICD-10-CM

## 2011-09-23 NOTE — Discharge Summary (Signed)
Physician Discharge Summary  Patient ID: [name] MRN: [id number] DOB/AGE: [birth year] 61 y.o.  Admit date: 09/08/2011 Discharge date: 09/23/2011  Admission Diagnoses: 1. Cardiogenic shock with elevated filling pressure  2. A/C biventricular heart failure with low output and cardiogenic shock (primary diagnosis)  3. 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 Severe MR and TR,  5. Hypertension  7. Hypomagnesemia/hyponatremia  8. Ongoing Tobacco abuse  9. Remote h/o cocaine and Marijuana abuse  Discharge Diagnoses:  Active Problems: 1. Cardiogenic shock with elevated filling pressure  2. A/C biventricular heart failure with low output and cardiogenic shock (principal diagnosis)  3. 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 Severe MR and TR,  5. Severe pulmonary hypertension.  6. Cardiorenal syndrome  7. A/C renal failure, likely multifactorial including cardiorenal syndrome  8. SVT/AVNRT  -- S/P SVT ablation x 2  9. Hypertension  10. Hypomagnesemia/hyponatremia  11. Ongoing Tobacco abuse  12. 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 He was diuresed with lasix but needed HF team consult and inotropic support. He had SVT ablation x 2. He was discharged home in stable condition with f/u by me and Dr. Gala Romney in 1 week and Dr. Rosette Reveal in 2-4 weeks with event monitor x 2 weeks.  Consults: cardiology  Significant Diagnostic Studies: labs: Stable electrolytes and BUN/Cr at the time of discharge and echocardiogram showing 25-30 % EF and mild to moderate MR, TR and mild to moderate pulmonary hypertension.  Treatments: cardiac meds: lisinopril (Prinivil), carvedilol and furosemide and procedures: EP study with ablation.  Discharge Exam: Blood pressure 97/55, pulse 77, temperature 98.7 F (37.1 C),  temperature source Oral, resp. rate 18, height 5\' 9"  (1.753 m), weight 60.102 kg (132 lb 8 oz), SpO2 8.00%. HEENT: [place]/AT, Eyes-Brown, PERL, EOMI, Conjunctiva-Pink, Sclera-Non-icteric  Neck: No JVD, No bruit, Trachea midline.  Lungs: Clear, Bilateral.  Cardiac: Regular rhythm, normal S1 and S2, no S3. III/VI systolic murmur. Abdomen: Soft, non-tender.  Extremities: No edema present. No cyanosis. No clubbing.  CNS: AxOx3, Cranial nerves grossly intact, moves all 4 extremities. Right handed.  Skin: Warm and dry.   Disposition: 01-Home or Self Care  Discharge Orders    Future Appointments: Provider: Department: Dept Phone: Center:   09/23/2011 11:00 AM [facility] [place] [phone] [facility]   09/28/2011 12:00 PM [facility] [phone] None   11/03/2011 9:00 AM Marinus Maw, MD [facility] [phone] [facility]     Medication List  As of 09/23/2011  9:14 AM   STOP taking these medications         nebivolol 10 MG tablet      potassium chloride SA 20 MEQ tablet         TAKE these medications         albuterol 108 (90 BASE) MCG/ACT inhaler   Commonly known as: PROVENTIL HFA;VENTOLIN HFA   Inhale 2 puffs into the lungs every 6 (six) hours as needed. For shortness of breath      aspirin 81 MG EC tablet   Take 1 tablet (81 mg total) by mouth daily.      carvedilol 6.25 MG tablet   Commonly known as: COREG   Take 1 tablet (6.25 mg total) by mouth 2 (two) times daily with a meal.      diltiazem  30 MG tablet   Commonly known as: CARDIZEM   Take 2 tablets (60 mg total) by mouth 2 (two) times daily.      esomeprazole 40 MG capsule   Commonly known as: NEXIUM   Take 40 mg by mouth daily before breakfast.      furosemide 40 MG tablet   Commonly known as: LASIX   Take 40 mg by mouth daily.      HYDROcodone-acetaminophen 5-325 MG per tablet   Commonly known as: NORCO/VICODIN   Take 1 tablet by mouth at bedtime. For pain       isosorbide-hydrALAZINE 20-37.5 MG per tablet   Commonly known as: BIDIL   Take 0.5-1 tablets by mouth 3 (three) times daily.      lisinopril 5 MG tablet   Commonly known as: PRINIVIL,ZESTRIL   Take 1 tablet (5 mg total) by mouth daily.      multivitamins ther. w/minerals Tabs   Take 0.5 tablets by mouth 2 (two) times daily.           Follow-up Information    Follow up with Arvilla Meres, MD on 09/28/2011. (at 12p       [employer]  0008)    Contact information:   [address] [place] [address] [phone]       Follow up with Lewayne Bunting, MD on 11/03/2011. (at 9am)    Contact information:   [address] [place] [address] [phone]       Follow up with Lewayne Bunting, MD on 09/23/2011. (Pick up Holter Monitor at 11a)    Contact information:   [address] [place] [address] [phone]       Follow up with Ricki Rodriguez, MD. Schedule an appointment as soon as possible for a visit in 1 week.   Contact information:   [address] [place] [address] [phone]          Signed: Ricki Rodriguez 09/23/2011, 9:14 AM

## 2011-09-25 ENCOUNTER — Encounter (HOSPITAL_COMMUNITY): Payer: Medicare Other

## 2011-09-28 ENCOUNTER — Ambulatory Visit (HOSPITAL_COMMUNITY): Payer: Medicare Other | Attending: Internal Medicine

## 2011-09-28 NOTE — Progress Notes (Signed)
[name],PT Acute Rehabilitation [phone] (pager)  

## 2011-10-02 ENCOUNTER — Encounter (HOSPITAL_COMMUNITY): Payer: Self-pay

## 2011-10-02 ENCOUNTER — Ambulatory Visit (HOSPITAL_COMMUNITY)
Admission: RE | Admit: 2011-10-02 | Discharge: 2011-10-02 | Disposition: A | Discharge status: Home / Self Care | Payer: Medicare Other | Source: Ambulatory Visit | Attending: Internal Medicine | Admitting: Internal Medicine

## 2011-10-02 ENCOUNTER — Telehealth (HOSPITAL_COMMUNITY): Payer: Self-pay | Admitting: Cardiology

## 2011-10-02 VITALS — BP 106/68 | HR 58 | Ht 69.0 in | Wt 137.1 lb

## 2011-10-02 DIAGNOSIS — I2789 Other specified pulmonary heart diseases: Secondary | ICD-10-CM

## 2011-10-02 DIAGNOSIS — I471 Supraventricular tachycardia: Secondary | ICD-10-CM

## 2011-10-02 DIAGNOSIS — I5022 Chronic systolic (congestive) heart failure: Secondary | ICD-10-CM | POA: Insufficient documentation

## 2011-10-02 DIAGNOSIS — I498 Other specified cardiac arrhythmias: Secondary | ICD-10-CM | POA: Insufficient documentation

## 2011-10-02 DIAGNOSIS — I272 Pulmonary hypertension, unspecified: Secondary | ICD-10-CM

## 2011-10-02 LAB — BASIC METABOLIC PANEL
BUN: 34 mg/dL — ABNORMAL HIGH (ref 6–23)
CO2: 27 mEq/L (ref 19–32)
Calcium: 9.9 mg/dL (ref 8.4–10.5)
Chloride: 105 mEq/L (ref 96–112)
Creatinine, Ser: 1.79 mg/dL — ABNORMAL HIGH (ref 0.50–1.35)
Glucose, Bld: 108 mg/dL — ABNORMAL HIGH (ref 70–99)

## 2011-10-02 MED ORDER — ISOSORB DINITRATE-HYDRALAZINE 20-37.5 MG PO TABS: 1.0000 | ORAL_TABLET | Freq: Three times a day (TID) | ORAL | Status: DC

## 2011-10-02 NOTE — Assessment & Plan Note (Signed)
In SR after ablation.

## 2011-10-02 NOTE — Telephone Encounter (Signed)
PT WAS NOT AT HOME, LEFT MESSAGE WITH MALE FOR PT TO RETURN CALL

## 2011-10-02 NOTE — Patient Instructions (Addendum)
Stop taking diltiazeam (Cardizem).  Take Coreg 6.25mg  twice a day.  Take Bidil 1 tablet Three times a day  Take Lisinopril 5 mg daily.  Take Lasix 40 mg daily.  Follow up 2 weeks.  Your physician has requested that you have an echocardiogram. Echocardiography is a painless test that uses sound waves to create images of your heart. It provides your doctor with information about the size and shape of your heart and how well your heart's chambers and valves are working. This procedure takes approximately one hour. There are no restrictions for this procedure.  In 2 weeks

## 2011-10-02 NOTE — Assessment & Plan Note (Signed)
He is much improved. NYHA II. Volume status looks great. He has significant confusion over his meds. Reinforced need for daily weights and reviewed use of sliding scale diuretics. Will arrange Home Health Nurse visit to assess meds and help him set up pill boxes. Although I think he had a component of tachy-induced cardiomyopathy, I suspect EF will still be depressed after ablation as LV dysfunction has been long-standing. Will bring back in 2 weeks for echo. If EF <= 35% will refer back to Dr. Ladona Ridgel for CRT-D. Would eventually like to titrate meds once we get his current regimen clarified.

## 2011-10-02 NOTE — Assessment & Plan Note (Signed)
Unclear etiology. Will reassess with echo in 2 weeks. At some point will need repeat cath to re-evaluate and see if he needs targeted therapy with PDE-5 inhibition. At present level this would preclude work-up for transplant or VAD if he would need.

## 2011-10-02 NOTE — Progress Notes (Signed)
Primary Cardiologist: Dr. [name]   Weight Range   Baseline proBNP     HPI: [name] is a 61 year old man with longstanding NICM (NICM diagnosed in 1997 with EF 25-30%) and PAH. Followed by Dr. Algie Coffer.  Recently admitted with biventricular HF  and shock in the setting of SVT  2 D echo in 08/2011: LVEF 25-30% diffuse hypokinesis, severe MR, severe TR, RV moderately to severely reduced. PA 57.  Underwent RHC on admit with shock physiology. IABP placed and started on milrinone. Initial cath numbers below. Subsequently underwent AVNRT ablation with Dr. Ladona Ridgel.  RA = 17  RV = 87/14/18  PA = 87/56 (69)  PCW = 31  Fick cardiac output/index = 2.1/1.2  PVR = 12.6 Woods  FA sat = 98%  PA sat = 28%, 28   After tune-up Swam numbers with persistent PAH  CVP 3  PA 50/26 34  PCW 5  CO/CI 3.2/1.8 PVR = 9.0 Woods   Here for post hospital follow up.  He feels well, "like he could play in the world series"  No swelling. No orthopnea or PND. Can do all his activities without too much problem but gets tired if he really pushes it.  Weighing every day but he is a bit unclear about his meds. No palpitations.    ROS: All systems negative except as listed in HPI, PMH and Problem List.  Past Medical History  Diagnosis Date  . Hypertension   . GERD (gastroesophageal reflux disease)   . Hiatal hernia   . CHF (congestive heart failure)   . Cardiomyopathy     nonischemic   . Shortness of breath   . Pneumonia     hx of pna  . Atrial fibrillation      previously listed in DC summary from August 31, 2011, ? atrial tachycardia   . COPD (chronic obstructive pulmonary disease)     previously listed in DC summary from Feb 2012   . Chronic kidney disease (CKD), stage II (mild)     listed in chart although patient denies     Current Outpatient Prescriptions  Medication Sig Dispense Refill  . albuterol (PROVENTIL HFA;VENTOLIN HFA) 108 (90 BASE) MCG/ACT inhaler Inhale 2 puffs into the lungs  every 6 (six) hours as needed. For shortness of breath      . aspirin EC 81 MG EC tablet Take 1 tablet (81 mg total) by mouth daily.      . carvedilol (COREG) 6.25 MG tablet Take 1 tablet (6.25 mg total) by mouth 2 (two) times daily with a meal.  60 tablet  1  . diltiazem (CARDIZEM) 30 MG tablet Take 2 tablets (60 mg total) by mouth 2 (two) times daily.  60 tablet  1  . esomeprazole (NEXIUM) 40 MG capsule Take 40 mg by mouth daily before breakfast.      . furosemide (LASIX) 40 MG tablet Take 40 mg by mouth daily.        Marland Kitchen HYDROcodone-acetaminophen (NORCO) 5-325 MG per tablet Take 1 tablet by mouth at bedtime. For pain      . isosorbide-hydrALAZINE (BIDIL) 20-37.5 MG per tablet Take 0.5-1 tablets by mouth 3 (three) times daily.       Marland Kitchen lisinopril (PRINIVIL,ZESTRIL) 5 MG tablet Take 1 tablet (5 mg total) by mouth daily.  30 tablet  1  . Multiple Vitamins-Minerals (MULTIVITAMINS THER. W/MINERALS) TABS Take 0.5 tablets by mouth 2 (two) times daily.  PHYSICAL EXAM: Filed Vitals:   10/02/11 0844  BP: 106/68  Pulse: 58  Height: 5\' 9"  (1.753 m)  Weight: 137 lb 1.9 oz (62.197 kg)  SpO2: 99%    General: Improved NAD  Neck: supple. Carotids 2+ bilat; no bruits. No lymphadenopathy or thryomegaly appreciated.  Cor: PMI lateral displaced. Regular 2/6 MR no s3 JVP 5-6  Lungs:Clear anteriorly  Abdomen: soft, nontender No hepatosplenomegaly. No bruits or masses. Good bowel sounds.  Extremities: no cyanosis, clubbing, rash,  Neuro: alert & orientedx3, cranial nerves grossly intact. moves all 4 extremities w/o difficulty. Affect pleasant    ECG: SB 54 LVH/LAFB QRS  ASSESSMENT & PLAN:

## 2011-10-05 NOTE — Telephone Encounter (Signed)
CLARIFICATION PT NEEDS LAB RESULTS FORM 10/02/11. LEFT MESSAGE WITH MALE FOR PT TO RETURN CALL

## 2011-10-05 NOTE — Telephone Encounter (Signed)
Attempting to contact pt about upcoming appt and echo... LM with male

## 2011-10-14 ENCOUNTER — Telehealth (HOSPITAL_COMMUNITY): Payer: Self-pay | Admitting: *Deleted

## 2011-10-14 NOTE — Telephone Encounter (Signed)
[facility] called to let us know that they had d/c'd pt b/c he is no longer hombound

## 2011-10-21 NOTE — Addendum Note (Signed)
Encounter addended by: Theresia Bough, CMA on: 10/21/2011  5:03 PM<BR>     Documentation filed: Orders

## 2011-10-22 ENCOUNTER — Ambulatory Visit (HOSPITAL_COMMUNITY): Payer: Medicare Other | Attending: Internal Medicine

## 2011-10-22 ENCOUNTER — Ambulatory Visit (HOSPITAL_COMMUNITY): Payer: Medicare Other

## 2011-10-28 DIAGNOSIS — I509 Heart failure, unspecified: Secondary | ICD-10-CM

## 2011-11-02 ENCOUNTER — Encounter (HOSPITAL_COMMUNITY): Payer: Self-pay | Admitting: *Deleted

## 2011-11-03 ENCOUNTER — Encounter: Payer: Self-pay | Admitting: Internal Medicine

## 2011-11-03 ENCOUNTER — Ambulatory Visit (INDEPENDENT_AMBULATORY_CARE_PROVIDER_SITE_OTHER): Payer: Medicare Other | Admitting: Internal Medicine

## 2011-11-03 VITALS — BP 114/76 | HR 55 | Ht 69.0 in | Wt 141.0 lb

## 2011-11-03 DIAGNOSIS — I5023 Acute on chronic systolic (congestive) heart failure: Secondary | ICD-10-CM

## 2011-11-03 DIAGNOSIS — I471 Supraventricular tachycardia: Secondary | ICD-10-CM

## 2011-11-03 DIAGNOSIS — I498 Other specified cardiac arrhythmias: Secondary | ICD-10-CM

## 2011-11-03 NOTE — Assessment & Plan Note (Signed)
His congestive heart failure symptoms appear to be much improved. He is at least class II. His ejection fraction has also improved slightly now 30%. I will defer the decision about recommendation for ICD implantation to his primary cardiologist, Dr. Teressa Lower. At this point the question is whether his ejection fraction is likely to continue to improve. I will see the patient back on an as-needed basis. If ICD implantation is thought to be indicated based on a lack of further improvement in his ejection fraction, and we would be happy to see him to discuss ICD implantation.

## 2011-11-03 NOTE — Patient Instructions (Addendum)
Your physician recommends that you schedule a follow-up appointment as needed   Will let Dr Gala Romney decide about getting ICD

## 2011-11-03 NOTE — Progress Notes (Signed)
HPI Mr. [name] returns today for followup. He is a very pleasant 61 year old man with a nonischemic cardiomyopathy and incessant SVT. He was hospitalized several weeks ago with this combination of problems and worsening heart failure. 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 Since then he has done well from an arrhythmia perspective. He has rare palpitations. No sustained arrhythmias. No syncope. His heart failure symptoms are improved. Repeat echocardiogram demonstrated an ejection fraction of 30%. No Known Allergies   Current Outpatient Prescriptions  Medication Sig Dispense Refill  . albuterol (PROVENTIL HFA;VENTOLIN HFA) 108 (90 BASE) MCG/ACT inhaler Inhale 2 puffs into the lungs every 6 (six) hours as needed. For shortness of breath      . aspirin EC 81 MG EC tablet Take 1 tablet (81 mg total) by mouth daily.      . carvedilol (COREG) 6.25 MG tablet Take 1 tablet (6.25 mg total) by mouth 2 (two) times daily with a meal.  60 tablet  1  . esomeprazole (NEXIUM) 40 MG capsule Take 40 mg by mouth daily before breakfast.      . furosemide (LASIX) 40 MG tablet Take 40 mg by mouth daily.        Marland Kitchen HYDROcodone-acetaminophen (NORCO) 5-325 MG per tablet Take 1 tablet by mouth at bedtime. For pain      . isosorbide-hydrALAZINE (BIDIL) 20-37.5 MG per tablet Take 1 tablet by mouth 3 (three) times daily.      Marland Kitchen lisinopril (PRINIVIL,ZESTRIL) 5 MG tablet Take 1 tablet (5 mg total) by mouth daily.  30 tablet  1  . Multiple Vitamins-Minerals (MULTIVITAMINS THER. W/MINERALS) TABS Take 0.5 tablets by mouth 2 (two) times daily.          Past Medical History  Diagnosis Date  . Hypertension   . GERD (gastroesophageal reflux disease)   . Hiatal hernia   . CHF (congestive heart failure)   . Cardiomyopathy     nonischemic   . Shortness of breath   . Pneumonia     hx of pna  . Atrial fibrillation      previously listed in DC summary from August 31, 2011, ? atrial tachycardia   . COPD (chronic obstructive pulmonary disease)     previously listed in DC summary from Feb 2012   . Chronic kidney disease (CKD), stage II (mild)     listed in chart although patient denies     ROS:   All systems reviewed and negative except as noted in the HPI.   Past Surgical History  Procedure Date  . Cardiac catheterization      Family History  Problem Relation Age of Onset  . Lung cancer Mother     passed away from lung cancer  . Pneumonia Father     passed sway from PNA.  . Stroke Sister   . Diabetes Sister   . Seizures Brother   . Seizures Brother      History   Social History  . Marital Status: Single    Spouse Name: N/A    Number of Children: N/A  . Years of Education: N/A   Occupational History  . Not on file.   Social History Main Topics  . Smoking status: Former Smoker -- 0.2 packs/day for 15 years    Types: Cigarettes    Quit date: 08/18/2011  . Smokeless tobacco: Never Used  . Alcohol Use: Yes     social drinking 1-2 beers /month  . Drug  Use: Yes     Cocaine and Marijuana in 1990's and stopped all drug abuse in 2003  . Sexually Active: Yes   Other Topics Concern  . Not on file   Social History Narrative  . No narrative on file     BP 114/76  Pulse 55  Ht 5\' 9"  (1.753 m)  Wt 141 lb (63.957 kg)  BMI 20.82 kg/m2  SpO2 97%  Physical Exam:  Well appearing middle-aged man, NAD HEENT: Unremarkable Neck:  No JVD, no thyromegally Lungs:  Clear with no wheezes, rales, or rhonchi. HEART:  Regular rate rhythm, no murmurs, no rubs, no clicks Abd:  soft, positive bowel sounds, no organomegally, no rebound, no guarding Ext:  2 plus pulses, no edema, no cyanosis, no clubbing Skin:  No rashes no nodules Neuro:  CN II through XII intact, motor grossly intact  EKG Normal sinus rhythm  Assess/Plan:

## 2011-11-03 NOTE — Assessment & Plan Note (Signed)
He has had no recurrent symptoms of SVT. He will undergo watchful waiting.

## 2012-05-10 ENCOUNTER — Inpatient Hospital Stay (HOSPITAL_COMMUNITY)
Admission: AD | Admit: 2012-05-10 | Discharge: 2012-05-13 | DRG: 309 | Disposition: A | Discharge status: Home / Self Care | Payer: Medicare Other | Source: Ambulatory Visit | Attending: Cardiovascular Disease | Admitting: Cardiovascular Disease

## 2012-05-10 ENCOUNTER — Inpatient Hospital Stay (HOSPITAL_COMMUNITY): Payer: Medicare Other

## 2012-05-10 ENCOUNTER — Encounter (HOSPITAL_COMMUNITY): Payer: Self-pay | Admitting: *Deleted

## 2012-05-10 DIAGNOSIS — I4891 Unspecified atrial fibrillation: Secondary | ICD-10-CM | POA: Diagnosis present

## 2012-05-10 DIAGNOSIS — I428 Other cardiomyopathies: Secondary | ICD-10-CM | POA: Diagnosis present

## 2012-05-10 DIAGNOSIS — I5022 Chronic systolic (congestive) heart failure: Secondary | ICD-10-CM | POA: Diagnosis present

## 2012-05-10 DIAGNOSIS — I4892 Unspecified atrial flutter: Principal | ICD-10-CM | POA: Diagnosis present

## 2012-05-10 DIAGNOSIS — K449 Diaphragmatic hernia without obstruction or gangrene: Secondary | ICD-10-CM | POA: Diagnosis present

## 2012-05-10 DIAGNOSIS — Z7982 Long term (current) use of aspirin: Secondary | ICD-10-CM

## 2012-05-10 DIAGNOSIS — Z87891 Personal history of nicotine dependence: Secondary | ICD-10-CM

## 2012-05-10 DIAGNOSIS — J449 Chronic obstructive pulmonary disease, unspecified: Secondary | ICD-10-CM | POA: Diagnosis present

## 2012-05-10 DIAGNOSIS — J4489 Other specified chronic obstructive pulmonary disease: Secondary | ICD-10-CM | POA: Diagnosis present

## 2012-05-10 DIAGNOSIS — I129 Hypertensive chronic kidney disease with stage 1 through stage 4 chronic kidney disease, or unspecified chronic kidney disease: Secondary | ICD-10-CM | POA: Diagnosis present

## 2012-05-10 DIAGNOSIS — N182 Chronic kidney disease, stage 2 (mild): Secondary | ICD-10-CM | POA: Diagnosis present

## 2012-05-10 DIAGNOSIS — K219 Gastro-esophageal reflux disease without esophagitis: Secondary | ICD-10-CM | POA: Diagnosis present

## 2012-05-10 DIAGNOSIS — Z79899 Other long term (current) drug therapy: Secondary | ICD-10-CM

## 2012-05-10 DIAGNOSIS — I509 Heart failure, unspecified: Secondary | ICD-10-CM | POA: Diagnosis present

## 2012-05-10 DIAGNOSIS — I059 Rheumatic mitral valve disease, unspecified: Secondary | ICD-10-CM | POA: Diagnosis present

## 2012-05-10 LAB — COMPREHENSIVE METABOLIC PANEL
ALT: 31 U/L (ref 0–53)
AST: 40 U/L — ABNORMAL HIGH (ref 0–37)
Albumin: 3.5 g/dL (ref 3.5–5.2)
CO2: 23 mEq/L (ref 19–32)
Calcium: 9.7 mg/dL (ref 8.4–10.5)
Creatinine, Ser: 1.71 mg/dL — ABNORMAL HIGH (ref 0.50–1.35)
Sodium: 139 mEq/L (ref 135–145)

## 2012-05-10 LAB — CBC WITH DIFFERENTIAL/PLATELET
Basophils Relative: 1 % (ref 0–1)
Eosinophils Absolute: 0.3 10*3/uL (ref 0.0–0.7)
Eosinophils Relative: 5 % (ref 0–5)
HCT: 41.4 % (ref 39.0–52.0)
Hemoglobin: 14.5 g/dL (ref 13.0–17.0)
Lymphs Abs: 1.6 10*3/uL (ref 0.7–4.0)
MCH: 34 pg (ref 26.0–34.0)
MCHC: 35 g/dL (ref 30.0–36.0)
MCV: 97.2 fL (ref 78.0–100.0)
Monocytes Absolute: 0.6 10*3/uL (ref 0.1–1.0)
Monocytes Relative: 13 % — ABNORMAL HIGH (ref 3–12)
RBC: 4.26 MIL/uL (ref 4.22–5.81)

## 2012-05-10 LAB — TROPONIN I
Troponin I: <0.3 ng/mL (ref <0.30)
Troponin I: <0.3 ng/mL (ref <0.30)

## 2012-05-10 LAB — HEPARIN LEVEL (UNFRACTIONATED): Heparin Unfractionated: 0.44 IU/mL (ref 0.30–0.70)

## 2012-05-10 LAB — PROTIME-INR: Prothrombin Time: 13.1 seconds (ref 11.6–15.2)

## 2012-05-10 MED ORDER — SODIUM CHLORIDE 0.9 % IJ SOLN
3.0000 mL | Freq: Two times a day (BID) | INTRAMUSCULAR | Status: DC
  Administered 2012-05-10 – 2012-05-13 (×5): 3 mL via INTRAVENOUS

## 2012-05-10 MED ORDER — HEPARIN SODIUM (PORCINE) 5000 UNIT/ML IJ SOLN
5000.0000 [IU] | Freq: Three times a day (TID) | INTRAMUSCULAR | Status: DC
  Filled 2012-05-10 (×3): qty 1

## 2012-05-10 MED ORDER — LISINOPRIL 5 MG PO TABS
5.0000 mg | ORAL_TABLET | Freq: Every day | ORAL | Status: DC
  Administered 2012-05-12 – 2012-05-13 (×2): 5 mg via ORAL
  Filled 2012-05-10 (×4): qty 1

## 2012-05-10 MED ORDER — OXYCODONE HCL 5 MG PO TABS
5.0000 mg | ORAL_TABLET | Freq: Two times a day (BID) | ORAL | Status: DC | PRN
  Administered 2012-05-10 – 2012-05-12 (×3): 5 mg via ORAL
  Filled 2012-05-10 (×2): qty 1

## 2012-05-10 MED ORDER — CARVEDILOL 6.25 MG PO TABS
6.2500 mg | ORAL_TABLET | Freq: Two times a day (BID) | ORAL | Status: DC
Start: 2012-05-10 — End: 2012-05-13
  Administered 2012-05-10 – 2012-05-13 (×5): 6.25 mg via ORAL
  Filled 2012-05-10 (×8): qty 1

## 2012-05-10 MED ORDER — ADULT MULTIVITAMIN W/MINERALS CH
1.0000 | ORAL_TABLET | Freq: Every day | ORAL | Status: DC
  Administered 2012-05-12 – 2012-05-13 (×2): 1 via ORAL
  Filled 2012-05-10 (×4): qty 1

## 2012-05-10 MED ORDER — DIGOXIN 0.25 MG/ML IJ SOLN
0.2500 mg | Freq: Once | INTRAMUSCULAR | Status: AC
  Administered 2012-05-10: 0.25 mg via INTRAVENOUS
  Filled 2012-05-10: qty 1

## 2012-05-10 MED ORDER — HEPARIN BOLUS VIA INFUSION
4000.0000 [IU] | Freq: Once | INTRAVENOUS | Status: AC
  Administered 2012-05-10: 4000 [IU] via INTRAVENOUS
  Filled 2012-05-10: qty 4000

## 2012-05-10 MED ORDER — SODIUM CHLORIDE 0.9 % IV SOLN
250.0000 mL | INTRAVENOUS | Status: DC | PRN
  Administered 2012-05-11: 15:00:00 via INTRAVENOUS

## 2012-05-10 MED ORDER — THERA M PLUS PO TABS: 1.0000 | ORAL_TABLET | Freq: Every day | ORAL | Status: DC

## 2012-05-10 MED ORDER — FUROSEMIDE 40 MG PO TABS
40.0000 mg | ORAL_TABLET | Freq: Every day | ORAL | Status: DC
  Administered 2012-05-12 – 2012-05-13 (×2): 40 mg via ORAL
  Filled 2012-05-10 (×4): qty 1

## 2012-05-10 MED ORDER — HYDROCODONE-ACETAMINOPHEN 5-325 MG PO TABS
1.0000 | ORAL_TABLET | Freq: Every day | ORAL | Status: DC
  Administered 2012-05-10 – 2012-05-12 (×3): 1 via ORAL
  Filled 2012-05-10 (×3): qty 1

## 2012-05-10 MED ORDER — ISOSORB DINITRATE-HYDRALAZINE 20-37.5 MG PO TABS
1.0000 | ORAL_TABLET | Freq: Three times a day (TID) | ORAL | Status: DC
  Filled 2012-05-10 (×2): qty 1

## 2012-05-10 MED ORDER — SODIUM CHLORIDE 0.9 % IJ SOLN: 3.0000 mL | INTRAMUSCULAR | Status: DC | PRN

## 2012-05-10 MED ORDER — METOPROLOL TARTRATE 1 MG/ML IV SOLN
5.0000 mg | Freq: Once | INTRAVENOUS | Status: AC
  Administered 2012-05-10: 5 mg via INTRAVENOUS

## 2012-05-10 MED ORDER — PANTOPRAZOLE SODIUM 40 MG PO TBEC
40.0000 mg | DELAYED_RELEASE_TABLET | Freq: Every day | ORAL | Status: DC
  Administered 2012-05-10 – 2012-05-13 (×3): 40 mg via ORAL
  Filled 2012-05-10 (×2): qty 1

## 2012-05-10 MED ORDER — SODIUM CHLORIDE 0.9 % IJ SOLN
3.0000 mL | Freq: Two times a day (BID) | INTRAMUSCULAR | Status: DC
  Administered 2012-05-10 – 2012-05-13 (×4): 3 mL via INTRAVENOUS

## 2012-05-10 MED ORDER — METOPROLOL TARTRATE 1 MG/ML IV SOLN
INTRAVENOUS | Status: AC
  Filled 2012-05-10: qty 5

## 2012-05-10 MED ORDER — ASPIRIN EC 81 MG PO TBEC
81.0000 mg | DELAYED_RELEASE_TABLET | Freq: Every day | ORAL | Status: DC
  Administered 2012-05-12 – 2012-05-13 (×2): 81 mg via ORAL
  Filled 2012-05-10 (×4): qty 1

## 2012-05-10 MED ORDER — DILTIAZEM HCL 60 MG PO TABS
60.0000 mg | ORAL_TABLET | Freq: Four times a day (QID) | ORAL | Status: DC
  Administered 2012-05-10 – 2012-05-13 (×12): 60 mg via ORAL
  Filled 2012-05-10 (×16): qty 1

## 2012-05-10 MED ORDER — HEPARIN (PORCINE) IN NACL 100-0.45 UNIT/ML-% IJ SOLN
1100.0000 [IU]/h | INTRAMUSCULAR | Status: DC
  Administered 2012-05-10: 900 [IU]/h via INTRAVENOUS
  Administered 2012-05-11: 1100 [IU]/h via INTRAVENOUS
  Filled 2012-05-10 (×3): qty 250

## 2012-05-10 NOTE — Progress Notes (Signed)
Spoke to Dr. Algie Coffer regarding medications for patient. Patient states he dose not Bidil at home - per Dr. Algie Coffer okay to discontinued. Also discussed patient would like something for headache pain - per Dr.Kadakia enter order for Oxycodone 5mg  BID PRN for pain. Will continue to monitor patient

## 2012-05-10 NOTE — Progress Notes (Signed)
Dr. Algie Coffer at bedside. Order received to administer 5mg  IV Lopressor for increased heart rate. MD monitoring at bedside. Will continue to monitor patient.

## 2012-05-10 NOTE — H&P (Signed)
[name] is an 62 y.o. male.   Chief Complaint: Palpitation HPI: 62 years old male with dilated cardiomyopathy has 3 week history of palpitation and exertional dyspnea. He had SVT ablation by Dr. Ladona Ridgel x 2 in last 6 months. Today in office he has atrial flutter with moderate to rapid ventricular response.   Past Medical History  Diagnosis Date  . Hypertension   . GERD (gastroesophageal reflux disease)   . Hiatal hernia   . CHF (congestive heart failure)   . Cardiomyopathy     nonischemic   . Shortness of breath   . Pneumonia     hx of pna  . Atrial fibrillation      previously listed in DC summary from August 31, 2011, ? atrial tachycardia   . COPD (chronic obstructive pulmonary disease)     previously listed in DC summary from Feb 2012   . Chronic kidney disease (CKD), stage II (mild)     listed in chart although patient denies       Past Surgical History  Procedure Laterality Date  . Cardiac catheterization      Family History  Problem Relation Age of Onset  . Lung cancer Mother     passed away from lung cancer  . Pneumonia Father     passed sway from PNA.  . Stroke Sister   . Diabetes Sister   . Seizures Brother   . Seizures Brother    Social History:  reports that he quit smoking about 8 months ago. His smoking use included Cigarettes. He has a 3.75 pack-year smoking history. He has never used smokeless tobacco. He reports that  drinks alcohol. He reports that he uses illicit drugs.  Allergies: No Known Allergies  Medications Prior to Admission  Medication Sig Dispense Refill  . aspirin EC 81 MG EC tablet Take 1 tablet (81 mg total) by mouth daily.      . carvedilol (COREG) 6.25 MG tablet Take 1 tablet (6.25 mg total) by mouth 2 (two) times daily with a meal.  60 tablet  1  . diltiazem (CARDIZEM) 30 MG tablet Take 60 mg by mouth 2 (two) times daily.      . furosemide (LASIX) 40 MG tablet Take 40 mg by mouth daily.        Marland Kitchen lisinopril (PRINIVIL,ZESTRIL) 5 MG  tablet Take 1 tablet (5 mg total) by mouth daily.  30 tablet  1  . Multiple Vitamins-Minerals (MULTIVITAMINS THER. W/MINERALS) TABS Take 0.5 tablets by mouth 2 (two) times daily.       Marland Kitchen albuterol (PROVENTIL HFA;VENTOLIN HFA) 108 (90 BASE) MCG/ACT inhaler Inhale 2 puffs into the lungs every 6 (six) hours as needed. For shortness of breath      . esomeprazole (NEXIUM) 40 MG capsule Take 40 mg by mouth daily before breakfast.      . HYDROcodone-acetaminophen (NORCO) 5-325 MG per tablet Take 1 tablet by mouth at bedtime. For pain      . isosorbide-hydrALAZINE (BIDIL) 20-37.5 MG per tablet Take 1 tablet by mouth 3 (three) times daily.        No results found for this or any previous visit (from the past 48 hour(s)). No results found.  @ROS @ Constitutional: Negative for fever and chills.  HENT: Negative for neck pain and neck stiffness.  Eyes: Negative for pain.  Respiratory: Positive for shortness of breath.  Cardiovascular: Positive for chest pain.  Gastrointestinal: Negative for abdominal pain.  Genitourinary: Negative for dysuria.  Musculoskeletal:  Positive for back pain. No leg edema  Skin: Negative for rash.  Neurological: Positive for headaches.  Blood pressure 105/78, pulse 67, temperature 97.5 F (36.4 C), temperature source Oral, resp. rate 18, height 5\' 9"  (1.753 m), weight 67.405 kg (148 lb 9.6 oz), SpO2 100.00%. HENT: Head: Normocephalic and atraumatic. Eyes: Brown eyes, Conjunctivae and EOM are normal. Pupils are equal, round, and reactive to light.  Neck: Normal range of motion. Neck supple. Normal carotid pulses and full JVD present. Cardiovascular: S1 normal, S2 normal and irregular pulses. II/VI systolic murmur at left sternal border. Pulmonary/Chest: Lungs clear to auscultation.  Abdominal: Bowel sounds are normal. Soft and non-tender. Musculoskeletal: Normal range of motion. No edema. Neurological: He is alert and oriented to person, place, and time.  Skin: Skin is warm,  dry and intact. No rash noted. No cyanosis. + clubbing.  Psychiatric: He has a normal mood and affect.    Assessment/Plan Atrial flutter with rapid ventricular response Dilated cardiomyopathy. S/P SVT ablation x 2 Chronic left heart systolic failure.  Home medications + diltiazem + TEE with cardioversion  Aleeha Boline S 05/10/2012, 2:52 PM

## 2012-05-10 NOTE — Progress Notes (Addendum)
ANTICOAGULATION CONSULT NOTE  Pharmacy Consult for heparin Indication: aflutter  No Known Allergies  Patient Measurements: Height: 5\' 9"  (175.3 cm) Weight: 148 lb 9.6 oz (67.405 kg) IBW/kg (Calculated) : 70.7 Heparin Dosing Weight: 66kg  Vital Signs: Temp: 98.1 F (36.7 C) (03/25 2000) BP: 108/74 mmHg (03/26 0128) Pulse Rate: 77 (03/25 2000)  Labs:  Recent Labs  05/10/12 1522 05/10/12 2124 05/10/12 2125 05/11/12 0330  HGB 14.5  --   --  13.8  HCT 41.4  --   --  39.6  PLT 205  --   --  192  APTT 32  --   --   --   LABPROT 13.1  --   --   --   INR 1.00  --   --   --   HEPARINUNFRC  --  0.44  --  0.18*  CREATININE 1.71*  --   --   --   TROPONINI <0.30  --  <0.30  --     Estimated Creatinine Clearance: 43.2 ml/min (by C-G formula based on Cr of 1.71).  Assessment: 61 year old male with aflutter for heparin.   Goal of Therapy:  Heparin level 0.3-0.7 units/ml Monitor platelets by anticoagulation protocol: Yes   Plan:  Continue Heparin at current rate Follow-up am labs.  Geannie Risen, PharmD, BCPS   Addendum: Level this am subtherapeutic.  Heparin 2000 units IV bolus, then increase heparin 1100 units/hr Check heparin level in 8 hours.  Geannie Risen, PharmD, BCPS 05/11/2012 4:29 AM

## 2012-05-10 NOTE — Progress Notes (Signed)
ANTICOAGULATION CONSULT NOTE - Initial Consult  Pharmacy Consult for heparin Indication: aflutter  No Known Allergies  Patient Measurements: Height: 5\' 9"  (175.3 cm) Weight: 148 lb 9.6 oz (67.405 kg) IBW/kg (Calculated) : 70.7 Heparin Dosing Weight: 66kg  Vital Signs: Temp: 97.5 F (36.4 C) (03/25 1336) Temp src: Oral (03/25 1336) BP: 105/78 mmHg (03/25 1336) Pulse Rate: 67 (03/25 1336)  Labs: No results found for this basename: HGB, HCT, PLT, APTT, LABPROT, INR, HEPARINUNFRC, CREATININE, CKTOTAL, CKMB, TROPONINI,  in the last 72 hours  Estimated Creatinine Clearance: 41.3 ml/min (by C-G formula based on Cr of 1.79).   Medical History: Past Medical History  Diagnosis Date  . Hypertension   . GERD (gastroesophageal reflux disease)   . Hiatal hernia   . CHF (congestive heart failure)   . Cardiomyopathy     nonischemic   . Shortness of breath   . Pneumonia     hx of pna  . Atrial fibrillation      previously listed in DC summary from August 31, 2011, ? atrial tachycardia   . COPD (chronic obstructive pulmonary disease)     previously listed in DC summary from Feb 2012   . Chronic kidney disease (CKD), stage II (mild)     listed in chart although patient denies    Assessment: 62 year old male transferred to md office to [facility] for aflutter with RVR. Recent history of SVT ablation. Orders to initiate heparin. Baseline cbc/coags ordered.  Goal of Therapy:  Heparin level 0.3-0.7 units/ml Monitor platelets by anticoagulation protocol: Yes   Plan:  Give 4000 units bolus x 1 Start heparin infusion at 900 units/hr Check anti-Xa level in 6 hours and daily while on heparin Continue to monitor H&H and platelets  Sheppard Coil PharmD., BCPS Clinical Pharmacist Pager [phone] 05/10/2012 3:07 PM

## 2012-05-11 ENCOUNTER — Encounter (HOSPITAL_COMMUNITY): Payer: Self-pay | Admitting: Certified Registered"

## 2012-05-11 ENCOUNTER — Encounter (HOSPITAL_COMMUNITY): Payer: Self-pay | Admitting: Anesthesiology

## 2012-05-11 ENCOUNTER — Encounter (HOSPITAL_COMMUNITY)
Admission: AD | Disposition: A | Discharge status: Home / Self Care | Payer: Self-pay | Source: Ambulatory Visit | Attending: Cardiovascular Disease

## 2012-05-11 ENCOUNTER — Inpatient Hospital Stay (HOSPITAL_COMMUNITY): Payer: Medicare Other | Admitting: Certified Registered"

## 2012-05-11 ENCOUNTER — Encounter (HOSPITAL_COMMUNITY): Payer: Self-pay | Admitting: Gastroenterology

## 2012-05-11 HISTORY — PX: TEE WITHOUT CARDIOVERSION: SHX5443

## 2012-05-11 HISTORY — PX: CARDIOVERSION: SHX1299

## 2012-05-11 LAB — CBC
Platelets: 192 10*3/uL (ref 150–400)
RBC: 4.1 MIL/uL — ABNORMAL LOW (ref 4.22–5.81)
RDW: 12.2 % (ref 11.5–15.5)
WBC: 4.8 10*3/uL (ref 4.0–10.5)

## 2012-05-11 LAB — HEPARIN LEVEL (UNFRACTIONATED)
Heparin Unfractionated: 0.18 IU/mL — ABNORMAL LOW (ref 0.30–0.70)
Heparin Unfractionated: 0.8 IU/mL — ABNORMAL HIGH (ref 0.30–0.70)
Heparin Unfractionated: 0.97 IU/mL — ABNORMAL HIGH (ref 0.30–0.70)

## 2012-05-11 LAB — BASIC METABOLIC PANEL
CO2: 19 mEq/L (ref 19–32)
Calcium: 9.4 mg/dL (ref 8.4–10.5)
GFR calc non Af Amer: 39 mL/min — ABNORMAL LOW (ref >90)
Potassium: 4.1 mEq/L (ref 3.5–5.1)
Sodium: 135 mEq/L (ref 135–145)

## 2012-05-11 LAB — TROPONIN I: Troponin I: <0.3 ng/mL (ref <0.30)

## 2012-05-11 SURGERY — CARDIOVERSION
Anesthesia: Monitor Anesthesia Care | Wound class: Clean

## 2012-05-11 SURGERY — ECHOCARDIOGRAM, TRANSESOPHAGEAL
Anesthesia: Moderate Sedation

## 2012-05-11 MED ORDER — EPHEDRINE SULFATE 50 MG/ML IJ SOLN
INTRAMUSCULAR | Status: DC | PRN
  Administered 2012-05-11: 10 mg via INTRAVENOUS

## 2012-05-11 MED ORDER — DIPHENHYDRAMINE HCL 50 MG/ML IJ SOLN
INTRAMUSCULAR | Status: DC | PRN
  Administered 2012-05-11: 12.5 mg via INTRAVENOUS

## 2012-05-11 MED ORDER — HEPARIN (PORCINE) IN NACL 100-0.45 UNIT/ML-% IJ SOLN
800.0000 [IU]/h | INTRAMUSCULAR | Status: DC
  Filled 2012-05-11 (×2): qty 250

## 2012-05-11 MED ORDER — PROPOFOL 10 MG/ML IV BOLUS
INTRAVENOUS | Status: DC | PRN
  Administered 2012-05-11: 100 mg via INTRAVENOUS

## 2012-05-11 MED ORDER — LIDOCAINE HCL (CARDIAC) 20 MG/ML IV SOLN
INTRAVENOUS | Status: DC | PRN
  Administered 2012-05-11: 60 mg via INTRAVENOUS

## 2012-05-11 MED ORDER — BUTAMBEN-TETRACAINE-BENZOCAINE 2-2-14 % EX AERO
INHALATION_SPRAY | CUTANEOUS | Status: DC | PRN
  Administered 2012-05-11: 2 via TOPICAL

## 2012-05-11 MED ORDER — HEPARIN (PORCINE) IN NACL 100-0.45 UNIT/ML-% IJ SOLN
950.0000 [IU]/h | INTRAMUSCULAR | Status: DC
  Administered 2012-05-11: 950 [IU]/h via INTRAVENOUS
  Filled 2012-05-11: qty 250

## 2012-05-11 MED ORDER — HEPARIN BOLUS VIA INFUSION
2000.0000 [IU] | Freq: Once | INTRAVENOUS | Status: AC
  Administered 2012-05-11: 2000 [IU] via INTRAVENOUS
  Filled 2012-05-11: qty 2000

## 2012-05-11 MED ORDER — MIDAZOLAM HCL 10 MG/2ML IJ SOLN
INTRAMUSCULAR | Status: DC | PRN
  Administered 2012-05-11 (×3): 2 mg via INTRAVENOUS

## 2012-05-11 MED ORDER — SODIUM CHLORIDE 0.9 % IV SOLN: INTRAVENOUS | Status: DC

## 2012-05-11 MED ORDER — FENTANYL CITRATE 0.05 MG/ML IJ SOLN
INTRAMUSCULAR | Status: DC | PRN
  Administered 2012-05-11 (×3): 25 ug via INTRAVENOUS

## 2012-05-11 NOTE — Progress Notes (Signed)
Subjective:  Heart rate improving. Here for TEE with cardioversion.  Objective:  Vital Signs in the last 24 hours: Temp:  [97.5 F (36.4 C)-98.1 F (36.7 C)] 97.5 F (36.4 C) (03/26 0641) Pulse Rate:  [67-104] 72 (03/26 0641) Cardiac Rhythm:  [-] Atrial flutter (03/25 2028) Resp:  [18] 18 (03/26 0641) BP: (105-127)/(71-89) 112/71 mmHg (03/26 0641) SpO2:  [97 %-100 %] 97 % (03/26 0641) Weight:  [67.405 kg (148 lb 9.6 oz)] 67.405 kg (148 lb 9.6 oz) (03/25 1336)  Physical Exam: BP Readings from Last 1 Encounters:  05/11/12 112/71     Wt Readings from Last 1 Encounters:  05/10/12 67.405 kg (148 lb 9.6 oz)    Weight change:   HEENT: [place]/AT, Eyes-Brown, PERL, EOMI, Conjunctiva-Pink, Sclera-Non-icteric Neck: No JVD, No bruit, Trachea midline. Lungs:  Clear, Bilateral. Cardiac:  Regular rhythm, normal S1 and S2, no S3.  Abdomen:  Soft, non-tender. Extremities:  No edema present. No cyanosis. No clubbing. CNS: AxOx3, Cranial nerves grossly intact, moves all 4 extremities. Right handed. Skin: Warm and dry.   Intake/Output from previous day: 03/25 0701 - 03/26 0700 In: 3 [I.V.:3] Out: -     Lab Results: BMET    Component Value Date/Time   NA 135 05/11/2012 0330   K 4.1 05/11/2012 0330   CL 104 05/11/2012 0330   CO2 19 05/11/2012 0330   GLUCOSE 111* 05/11/2012 0330   BUN 34* 05/11/2012 0330   CREATININE 1.78* 05/11/2012 0330   CALCIUM 9.4 05/11/2012 0330   GFRNONAA 39* 05/11/2012 0330   GFRAA 46* 05/11/2012 0330   CBC    Component Value Date/Time   WBC 4.8 05/11/2012 0330   RBC 4.10* 05/11/2012 0330   HGB 13.8 05/11/2012 0330   HCT 39.6 05/11/2012 0330   PLT 192 05/11/2012 0330   MCV 96.6 05/11/2012 0330   MCH 33.7 05/11/2012 0330   MCHC 34.8 05/11/2012 0330   RDW 12.2 05/11/2012 0330   LYMPHSABS 1.6 05/10/2012 1522   MONOABS 0.6 05/10/2012 1522   EOSABS 0.3 05/10/2012 1522   BASOSABS 0.1 05/10/2012 1522   CARDIAC ENZYMES Lab Results  Component Value Date   CKTOTAL 65 09/08/2011    CKMB 3.5 09/08/2011   TROPONINI <0.30 05/11/2012    Scheduled Meds: . aspirin EC  81 mg Oral Daily  . carvedilol  6.25 mg Oral BID WC  . diltiazem  60 mg Oral Q6H  . furosemide  40 mg Oral Daily  . HYDROcodone-acetaminophen  1 tablet Oral QHS  . lisinopril  5 mg Oral Daily  . multivitamin with minerals  1 tablet Oral Daily  . pantoprazole  40 mg Oral Daily  . sodium chloride  3 mL Intravenous Q12H  . sodium chloride  3 mL Intravenous Q12H   Continuous Infusions: . sodium chloride 10 mL/hr at 05/11/12 0500  . heparin 1,100 Units/hr (05/11/12 0500)   PRN Meds:.sodium chloride, oxyCODONE, sodium chloride  Assessment/Plan: Atrial flutter with controlled ventricular response  Dilated cardiomyopathy.  S/P SVT ablation x 2  Chronic left heart systolic failure.  TEE with cardioversion today.   LOS: 1 day    [name]  MD  05/11/2012, 8:03 AM

## 2012-05-11 NOTE — Progress Notes (Signed)
  Echocardiogram Echocardiogram Transesophageal has been performed.  [name] 05/11/2012, 9:35 AM

## 2012-05-11 NOTE — CV Procedure (Signed)
INDICATIONS:   The patient is 62 years old with new onset atrial flutter.  PROCEDURE:  Informed consent was discussed including risks, benefits and alternatives for the procedure.  Risks include, but are not limited to, cough, sore throat, vomiting, nausea, somnolence, esophageal and stomach trauma or perforation, bleeding, low blood pressure, aspiration, pneumonia, infection, trauma to the teeth and death.    Patient was given sedation.  The oropharynx was anesthetized with topical lidocaine.  The transesophageal probe was inserted in the esophagus and stomach and multiple views were obtained.  Agitated saline was used after the transesophageal probe was removed from the body.  The patient was kept under observation until the patient left the procedure room.  The patient left the procedure room in stable condition.   COMPLICATIONS:  There were no immediate complications.  FINDINGS:  1. LEFT VENTRICLE: The left ventricle is normal in structure and function.  Wall motion showed mild generalized hypokinesia.  No thrombus or masses seen in the left ventricle.  2. RIGHT VENTRICLE:  The right ventricle is normal in structure and had mild hypokinesia and without any thrombus or masses.    3. LEFT ATRIUM:  The left atrium is mildly dilated without any thrombus or masses.  4. LEFT ATRIAL APPENDAGE:  The left atrial appendage is free of any thrombus or masses.  5. RIGHT ATRIUM:  The right atrium is free of any thrombus or masses.    6. ATRIAL SEPTUM:  The atrial septum is normal without any ASD or PFO on sonicated saline injection.  7. MITRAL VALVE:  The mitral valve is normal in structure and function with moderate to severe regurgitation. No masses, stenosis or vegetations.  8. TRICUSPID VALVE:  The tricuspid valve is normal in structure and function with mild regurgitation, masses, stenosis or vegetations.  9. AORTIC VALVE:  The aortic valve is normal in structure and function without  regurgitation, masses, stenosis or vegetations.   10. PULMONIC VALVE:  The pulmonic valve is normal in structure and function without regurgitation, masses, stenosis or vegetations.  11. AORTIC ARCH, ASCENDING AND DESCENDING AORTA:  The aorta had mild diffuse atherosclerosis in the ascending or descending aorta.  The aortic arch was normal.  IMPRESSION:   1. Mild LV systolic dysfunction. 2. No clot in LA appendage. 3. Moderate to severe MR and mild TR  RECOMMENDATIONS:    Cardioversion when anaethesia available. Continue IV heparin.

## 2012-05-11 NOTE — Progress Notes (Signed)
ANTICOAGULATION CONSULT NOTE  Pharmacy Consult for heparin Indication: aflutter  No Known Allergies  Patient Measurements: Height: 5\' 9"  (175.3 cm) Weight: 148 lb 9.6 oz (67.405 kg) IBW/kg (Calculated) : 70.7 Heparin Dosing Weight: 66kg  Vital Signs: Temp: 97.6 F (36.4 C) (03/26 1400) Temp src: Oral (03/26 1400) BP: 117/79 mmHg (03/26 1400) Pulse Rate: 60 (03/26 1400)  Labs:  Recent Labs  05/10/12 1522 05/10/12 2124 05/10/12 2125 05/11/12 0330 05/11/12 1150  HGB 14.5  --   --  13.8  --   HCT 41.4  --   --  39.6  --   PLT 205  --   --  192  --   APTT 32  --   --   --   --   LABPROT 13.1  --   --   --   --   INR 1.00  --   --   --   --   HEPARINUNFRC  --  0.44  --  0.18* 0.80*  CREATININE 1.71*  --   --  1.78*  --   TROPONINI <0.30  --  <0.30 <0.30  --     Estimated Creatinine Clearance: 41.5 ml/min (by C-G formula based on Cr of 1.78).  Assessment: 62 year old male with aflutter for heparin. Level this afternoon above goal. No bleeding or IV issues  Noted. Patient now s/p TEE/DCCV  Goal of Therapy:  Heparin level 0.3-0.7 units/ml Monitor platelets by anticoagulation protocol: Yes   Plan:  Decrease heparin to 950 units/hr Recheck level in 6 hours then daily  Sheppard Coil PharmD., BCPS Clinical Pharmacist Pager [phone] 05/11/2012 3:46 PM

## 2012-05-11 NOTE — Anesthesia Preprocedure Evaluation (Addendum)
Anesthesia Evaluation  Patient identified by MRN, date of birth, ID band Patient awake    Reviewed: Allergy & Precautions, H&P , NPO status , Patient's Chart, lab work & pertinent test results, reviewed documented beta blocker date and time   Airway Mallampati: II TM Distance: >3 FB     Dental  (+) Partial Lower   Pulmonary shortness of breath and with exertion, COPD breath sounds clear to auscultation        Cardiovascular hypertension, Pt. on medications +CHF + dysrhythmias Atrial Fibrillation Rhythm:Irregular     Neuro/Psych    GI/Hepatic GERD-  Medicated,  Endo/Other    Renal/GU Renal InsufficiencyRenal disease     Musculoskeletal   Abdominal   Peds  Hematology   Anesthesia Other Findings   Reproductive/Obstetrics                          Anesthesia Physical Anesthesia Plan  ASA: III  Anesthesia Plan: General   Post-op Pain Management:    Induction:   Airway Management Planned:   Additional Equipment:   Intra-op Plan:   Post-operative Plan:   Informed Consent:   Plan Discussed with:   Anesthesia Plan Comments:         Anesthesia Quick Evaluation

## 2012-05-11 NOTE — Anesthesia Postprocedure Evaluation (Signed)
  Anesthesia Post-op Note  Patient: [name]  Procedure(s) Performed: Procedure(s): CARDIOVERSION AT THE BEDSIDE (N/A)  Patient Location: Nursing Unit  Anesthesia Type:General  Level of Consciousness: patient cooperative and responds to stimulation  Airway and Oxygen Therapy: Patient Spontanous Breathing and Patient connected to nasal cannula oxygen  Post-op Pain: none  Post-op Assessment: Post-op Vital signs reviewed, Patient's Cardiovascular Status Stable, Respiratory Function Stable and Patent Airway  Post-op Vital Signs: Reviewed and stable  Complications: No apparent anesthesia complications

## 2012-05-11 NOTE — Progress Notes (Signed)
Pts BP 106/70 HR 60, MD called to see if coreg needed to be held. Pt needed bolus after cardioversion for sys BP 70-80's. Will hold for tonight's dose. Will continue to monitor patient.

## 2012-05-11 NOTE — CV Procedure (Signed)
PRE-OP DIAGNOSIS:  Atrial flutter.Marland Kitchen  POST-OP DIAGNOSIS:  Sinus rhythm..  OPERATOROrpah Cobb, MD.     ANESTHESIA:  General anesthesia.  COMPLICATIONS:  None.   OPERATIVE TERM:  DC cardioversion.  The nature of the procedure, risks and alternatives were discussed with the patient who gave informed consent.  OPERATIVE TECHNIQUE:  The patient was sedated with 100 mg. Propofol IV push.  When the patient was no longer responsive to quiet voice, DC cardioversion was performed with 120 J biphasically and synchronously.  Patient converted to sinus rhythm.  The patient was then monitored until fully alert and left the procedure area in stable condition.  IMPRESSION:  Successful DC cardioversion.

## 2012-05-11 NOTE — Progress Notes (Signed)
ANTICOAGULATION CONSULT NOTE  Pharmacy Consult for heparin Indication: aflutter  No Known Allergies  Patient Measurements: Height: 5\' 9"  (175.3 cm) Weight: 148 lb 9.6 oz (67.405 kg) IBW/kg (Calculated) : 70.7 Heparin Dosing Weight: 66kg  Labs:  Recent Labs  05/10/12 1522  05/10/12 2125 05/11/12 0330 05/11/12 1150 05/11/12 2203  HGB 14.5  --   --  13.8  --   --   HCT 41.4  --   --  39.6  --   --   PLT 205  --   --  192  --   --   APTT 32  --   --   --   --   --   LABPROT 13.1  --   --   --   --   --   INR 1.00  --   --   --   --   --   HEPARINUNFRC  --   < >  --  0.18* 0.80* 0.97*  CREATININE 1.71*  --   --  1.78*  --   --   TROPONINI <0.30  --  <0.30 <0.30  --   --   < > = values in this interval not displayed.  Estimated Creatinine Clearance: 41.5 ml/min (by C-G formula based on Cr of 1.78).  Assessment: 62 year old male with aflutter continues on IV heparin s/p TEE/DCCV. Level remains above goal at 0.97 despite rate decreased this afternoon. No bleeding or IV issues.  Goal of Therapy:  Heparin level 0.3-0.7 units/ml Monitor platelets by anticoagulation protocol: Yes   Plan:  - Hold heparin infusion for one hour, at midnight, resume heparin infusion at 800 units/hr. - Recheck level at 0600 along with CBC  Thanks, Jeramie Scogin K. Allena Katz, PharmD, BCPS.  Clinical Pharmacist Pager [phone]. 05/11/2012 10:51 PM

## 2012-05-11 NOTE — Preoperative (Signed)
Beta Blockers   Reason not to administer Beta Blockers:Not Applicable 

## 2012-05-11 NOTE — Transfer of Care (Signed)
Immediate Anesthesia Transfer of Care Note  Patient: [name]  Procedure(s) Performed: Procedure(s): CARDIOVERSION AT THE BEDSIDE (N/A)  Patient Location: Nursing Unit  Anesthesia Type:General  Level of Consciousness: lethargic and responds to stimulation  Airway & Oxygen Therapy: Patient Spontanous Breathing and Patient connected to nasal cannula oxygen  Post-op Assessment: Report given to PACU RN, Post -op Vital signs reviewed and stable and Patient moving all extremities  Post vital signs: Reviewed and stable  Complications: No apparent anesthesia complications

## 2012-05-12 ENCOUNTER — Encounter (HOSPITAL_COMMUNITY): Payer: Self-pay | Admitting: Cardiovascular Disease

## 2012-05-12 LAB — BASIC METABOLIC PANEL
GFR calc Af Amer: 41 mL/min — ABNORMAL LOW (ref >90)
GFR calc non Af Amer: 35 mL/min — ABNORMAL LOW (ref >90)
Glucose, Bld: 84 mg/dL (ref 70–99)
Potassium: 4 mEq/L (ref 3.5–5.1)
Sodium: 137 mEq/L (ref 135–145)

## 2012-05-12 LAB — CBC
Hemoglobin: 13.5 g/dL (ref 13.0–17.0)
RBC: 4 MIL/uL — ABNORMAL LOW (ref 4.22–5.81)
WBC: 4.8 10*3/uL (ref 4.0–10.5)

## 2012-05-12 LAB — HEPARIN LEVEL (UNFRACTIONATED): Heparin Unfractionated: 0.32 IU/mL (ref 0.30–0.70)

## 2012-05-12 MED ORDER — DABIGATRAN ETEXILATE MESYLATE 75 MG PO CAPS
75.0000 mg | ORAL_CAPSULE | Freq: Two times a day (BID) | ORAL | Status: DC
  Administered 2012-05-12 – 2012-05-13 (×3): 75 mg via ORAL
  Filled 2012-05-12 (×4): qty 1

## 2012-05-12 NOTE — Progress Notes (Signed)
Subjective:  Bit tongue post procedure yesterday. Sinus rhythm continues.  Objective:  Vital Signs in the last 24 hours: Temp:  [97.7 F (36.5 C)-98 F (36.7 C)] 98 F (36.7 C) (03/27 1338) Pulse Rate:  [62-70] 70 (03/27 1338) Cardiac Rhythm:  [-] Normal sinus rhythm (03/27 0900) Resp:  [18-19] 18 (03/27 1338) BP: (101-114)/(63-77) 114/77 mmHg (03/27 1338) SpO2:  [97 %-98 %] 97 % (03/27 1338) Weight:  [72.576 kg (160 lb)] 72.576 kg (160 lb) (03/27 4098)  Physical Exam: BP Readings from Last 1 Encounters:  05/12/12 114/77     Wt Readings from Last 1 Encounters:  05/12/12 72.576 kg (160 lb)    Weight change: 5.171 kg (11 lb 6.4 oz)  HEENT: [place]/AT, Eyes-Brown, PERL, EOMI, Conjunctiva-Pink, Sclera-Non-icteric Tip of tongue blue-black Neck: No JVD, No bruit, Trachea midline. Lungs:  Clear, Bilateral. Cardiac:  Regular rhythm, normal S1 and S2, no S3.  Abdomen:  Soft, non-tender. Extremities:  No edema present. No cyanosis. No clubbing. CNS: AxOx3, Cranial nerves grossly intact, moves all 4 extremities. Right handed. Skin: Warm and dry.   Intake/Output from previous day: 03/26 0701 - 03/27 0700 In: 770 [P.O.:620; I.V.:150] Out: 900 [Urine:900]    Lab Results: BMET    Component Value Date/Time   NA 137 05/12/2012 0950   K 4.0 05/12/2012 0950   CL 102 05/12/2012 0950   CO2 23 05/12/2012 0950   GLUCOSE 84 05/12/2012 0950   BUN 26* 05/12/2012 0950   CREATININE 1.95* 05/12/2012 0950   CALCIUM 9.6 05/12/2012 0950   GFRNONAA 35* 05/12/2012 0950   GFRAA 41* 05/12/2012 0950   CBC    Component Value Date/Time   WBC 4.8 05/12/2012 0950   RBC 4.00* 05/12/2012 0950   HGB 13.5 05/12/2012 0950   HCT 39.6 05/12/2012 0950   PLT 168 05/12/2012 0950   MCV 99.0 05/12/2012 0950   MCH 33.8 05/12/2012 0950   MCHC 34.1 05/12/2012 0950   RDW 12.2 05/12/2012 0950   LYMPHSABS 1.6 05/10/2012 1522   MONOABS 0.6 05/10/2012 1522   EOSABS 0.3 05/10/2012 1522   BASOSABS 0.1 05/10/2012 1522   CARDIAC  ENZYMES Lab Results  Component Value Date   CKTOTAL 65 09/08/2011   CKMB 3.5 09/08/2011   TROPONINI <0.30 05/11/2012    Scheduled Meds: . aspirin EC  81 mg Oral Daily  . carvedilol  6.25 mg Oral BID WC  . dabigatran  75 mg Oral Q12H  . diltiazem  60 mg Oral Q6H  . furosemide  40 mg Oral Daily  . HYDROcodone-acetaminophen  1 tablet Oral QHS  . lisinopril  5 mg Oral Daily  . multivitamin with minerals  1 tablet Oral Daily  . pantoprazole  40 mg Oral Daily  . sodium chloride  3 mL Intravenous Q12H  . sodium chloride  3 mL Intravenous Q12H   Continuous Infusions:  PRN Meds:.sodium chloride, oxyCODONE, sodium chloride  Assessment/Plan: Atrial flutter with controlled ventricular response now sinus rhythm Dilated cardiomyopathy.  S/P SVT ablation x 2  Chronic left heart systolic failure. Tongue bruize  Start low dose Pradaxa Home in AM. DC heparin in 2-3 hours.   LOS: 2 days    [name]  MD  05/12/2012, 2:21 PM

## 2012-05-12 NOTE — Progress Notes (Signed)
ANTICOAGULATION CONSULT NOTE - Follow Up Consult  Pharmacy Consult for Heparin Indication: atrial fibrillation/flutter  No Known Allergies  Patient Measurements: Height: 5\' 9"  (175.3 cm) Weight: 160 lb (72.576 kg) IBW/kg (Calculated) : 70.7 Heparin Dosing Weight:   Vital Signs: Temp: 97.7 F (36.5 C) (03/27 0620) Temp src: Oral (03/27 0620) BP: 106/69 mmHg (03/27 0700) Pulse Rate: 63 (03/27 0700)  Labs:  Recent Labs  05/10/12 1522  05/10/12 2125 05/11/12 0330 05/11/12 1150 05/11/12 2203 05/12/12 0950  HGB 14.5  --   --  13.8  --   --  13.5  HCT 41.4  --   --  39.6  --   --  39.6  PLT 205  --   --  192  --   --  168  APTT 32  --   --   --   --   --   --   LABPROT 13.1  --   --   --   --   --   --   INR 1.00  --   --   --   --   --   --   HEPARINUNFRC  --   < >  --  0.18* 0.80* 0.97* 0.32  CREATININE 1.71*  --   --  1.78*  --   --  1.95*  TROPONINI <0.30  --  <0.30 <0.30  --   --   --   < > = values in this interval not displayed.  Estimated Creatinine Clearance: 39.8 ml/min (by C-G formula based on Cr of 1.95).   Assessment::seen in Dr.Office found to have aflutter with rvr, trx to [facility]  Anticoagulation: aflutter -HL 0.32 now in goal range. Hgb stable.  Cardiovascular:aflutter with hx svt ablation x2 in last 72m,  s/p bedside CV 05/11/2012, TEE shows no lv clot. Chronic systolic hf with ef 25-30%. VSS. Meds: ASA 81mg , Coreg, Diltiazem, po Lasix, lisinopril,   Endocrinology: Glucose 84  Nephrology: Scr up to 1.95  Hematology / Oncology:cbc stable  PTA Medication Issues:home meds continued  Best Practices:heparin/ptx  Goal of Therapy:  Heparin level 0.3-0.7 units/ml Monitor platelets by anticoagulation protocol: Yes   Plan:  Continue IV heparin at 800 units/hr. F/u plan   Lovinia Snare S. Merilynn Finland, PharmD, BCPS Clinical Staff Pharmacist Pager [phone] Misty Stanley Stillinger 05/12/2012,12:33 PM

## 2012-05-13 MED ORDER — DABIGATRAN ETEXILATE MESYLATE 75 MG PO CAPS: 75.0000 mg | ORAL_CAPSULE | Freq: Two times a day (BID) | ORAL | Status: DC

## 2012-05-13 MED ORDER — PANTOPRAZOLE SODIUM 40 MG PO TBEC: 40.0000 mg | DELAYED_RELEASE_TABLET | Freq: Every day | ORAL | Status: DC

## 2012-05-13 MED ORDER — HYDROCODONE-ACETAMINOPHEN 5-325 MG PO TABS: 1.0000 | ORAL_TABLET | Freq: Every day | ORAL | Status: DC

## 2012-05-13 MED ORDER — DILTIAZEM HCL 120 MG PO TABS: 120.0000 mg | ORAL_TABLET | Freq: Two times a day (BID) | ORAL | Status: DC

## 2012-05-13 NOTE — Discharge Summary (Signed)
Physician Discharge Summary  Patient ID: [name] MRN: [id number] DOB/AGE: Jan 01, 1951 62 y.o.  Admit date: 05/10/2012 Discharge date: 05/13/2012  Admission Diagnoses: Atrial flutter with rapid ventricular response   Dilated cardiomyopathy.  S/P SVT ablation x 2  Chronic left heart systolic failure.    Discharge Diagnoses:  Principle Problem:  * Atrial flutter-new onset *  Dilated cardiomyopathy.  Severe Mitral regurgitation S/P SVT ablation x 2  Chronic left heart systolic failure.  Tongue bruize Headache CKD, II  Discharged Condition: good  Hospital Course: 62 years old male with dilated cardiomyopathy had 3 week history of palpitation and exertional dyspnea. He had SVT ablation by Dr. Ladona Ridgel x 2 in last 6 months. In office he had atrial flutter with moderate to rapid ventricular response. His heart rate was controlled with diltiazem, metoprolol and lanoxin use. He underwent TEE with cardioversion successfully and converted to sinus rhythm. He was discharged home with low dose dabigatran use.  Consults: None  Significant Diagnostic Studies: labs: Near normal CBC and BMET execpt BUN-34 and Creatinine-1.78 - 1.95  Echocardiogram: TEE : Mild LV systolic dysfunction with with no clot or vegetation. Moderate to severe MR.  Treatments: cardiac meds: lisinopril (Prinivil), carvedilol, diltiazem and digoxin, anticoagulation: heparin and dabigatran and procedures: TEE with cardioversion  Discharge Exam: Blood pressure 110/70, pulse 61, temperature 98 F (36.7 C), temperature source Oral, resp. rate 18, height 5\' 9"  (1.753 m), weight 74.481 kg (164 lb 3.2 oz), SpO2 96.00%.  HEENT: [place]/AT, Eyes-Brown, PERL, EOMI, Conjunctiva-Pink, Sclera-Non-icteric Tip of tongue blue-black  Neck: No JVD, No bruit, Trachea midline.  Lungs: Clear, Bilateral.  Cardiac: Regular rhythm, normal S1 and S2, no S3.  Abdomen: Soft, non-tender.  Extremities: No edema present. No cyanosis. No clubbing.   CNS: AxOx3, Cranial nerves grossly intact, moves all 4 extremities. Right handed.  Skin: Warm and dry.  Disposition: 01-Home or Self Care     Medication List    STOP taking these medications       aspirin 81 MG EC tablet     esomeprazole 40 MG capsule  Commonly known as:  NEXIUM  Replaced by:  pantoprazole 40 MG tablet      TAKE these medications       albuterol 108 (90 BASE) MCG/ACT inhaler  Commonly known as:  PROVENTIL HFA;VENTOLIN HFA  Inhale 2 puffs into the lungs every 6 (six) hours as needed. For shortness of breath     carvedilol 6.25 MG tablet  Commonly known as:  COREG  Take 1 tablet (6.25 mg total) by mouth 2 (two) times daily with a meal.     dabigatran 75 MG Caps  Commonly known as:  PRADAXA  Take 1 capsule (75 mg total) by mouth every 12 (twelve) hours.     diltiazem 120 MG tablet  Commonly known as:  CARDIZEM  Take 1 tablet (120 mg total) by mouth 2 (two) times daily.     furosemide 40 MG tablet  Commonly known as:  LASIX  Take 40 mg by mouth daily.     HYDROcodone-acetaminophen 5-325 MG per tablet  Commonly known as:  NORCO/VICODIN  Take 1 tablet by mouth at bedtime.     lisinopril 5 MG tablet  Commonly known as:  PRINIVIL,ZESTRIL  Take 1 tablet (5 mg total) by mouth daily.     multivitamins ther. w/minerals Tabs  Take 0.5 tablets by mouth 2 (two) times daily.     pantoprazole 40 MG tablet  Commonly known as:  PROTONIX  Take 1 tablet (40 mg total) by mouth daily.           Follow-up Information   Follow up with [facility] S, MD. Schedule an appointment as soon as possible for a visit in 1 week.   Contact information:   [address] [place] [address] [phone]       Signed: Ricki Rodriguez 05/13/2012, 10:12 AM

## 2012-05-16 ENCOUNTER — Encounter (HOSPITAL_COMMUNITY): Payer: Self-pay | Admitting: Cardiovascular Disease

## 2012-12-22 ENCOUNTER — Observation Stay (HOSPITAL_COMMUNITY)
Admission: EM | Admit: 2012-12-22 | Discharge: 2012-12-25 | Disposition: A | Discharge status: Home / Self Care | Payer: Medicare Other | Attending: Cardiovascular Disease | Admitting: Cardiovascular Disease

## 2012-12-22 ENCOUNTER — Encounter (HOSPITAL_COMMUNITY): Payer: Self-pay | Admitting: Emergency Medicine

## 2012-12-22 ENCOUNTER — Emergency Department (HOSPITAL_COMMUNITY): Payer: Medicare Other

## 2012-12-22 DIAGNOSIS — I959 Hypotension, unspecified: Secondary | ICD-10-CM

## 2012-12-22 DIAGNOSIS — R112 Nausea with vomiting, unspecified: Secondary | ICD-10-CM | POA: Insufficient documentation

## 2012-12-22 DIAGNOSIS — I428 Other cardiomyopathies: Secondary | ICD-10-CM | POA: Insufficient documentation

## 2012-12-22 DIAGNOSIS — Z79899 Other long term (current) drug therapy: Secondary | ICD-10-CM | POA: Insufficient documentation

## 2012-12-22 DIAGNOSIS — K5900 Constipation, unspecified: Secondary | ICD-10-CM | POA: Insufficient documentation

## 2012-12-22 DIAGNOSIS — I509 Heart failure, unspecified: Secondary | ICD-10-CM | POA: Insufficient documentation

## 2012-12-22 DIAGNOSIS — I5022 Chronic systolic (congestive) heart failure: Secondary | ICD-10-CM | POA: Insufficient documentation

## 2012-12-22 DIAGNOSIS — N182 Chronic kidney disease, stage 2 (mild): Secondary | ICD-10-CM | POA: Insufficient documentation

## 2012-12-22 DIAGNOSIS — I4892 Unspecified atrial flutter: Secondary | ICD-10-CM | POA: Insufficient documentation

## 2012-12-22 DIAGNOSIS — I2789 Other specified pulmonary heart diseases: Secondary | ICD-10-CM | POA: Insufficient documentation

## 2012-12-22 DIAGNOSIS — R0789 Other chest pain: Principal | ICD-10-CM | POA: Insufficient documentation

## 2012-12-22 HISTORY — DX: Headache: R51

## 2012-12-22 HISTORY — DX: Major depressive disorder, single episode, unspecified: F32.9

## 2012-12-22 HISTORY — DX: Cardiac murmur, unspecified: R01.1

## 2012-12-22 HISTORY — DX: Depression, unspecified: F32.A

## 2012-12-22 HISTORY — DX: Supraventricular tachycardia: I47.1

## 2012-12-22 HISTORY — DX: Supraventricular tachycardia, unspecified: I47.10

## 2012-12-22 HISTORY — DX: Unspecified osteoarthritis, unspecified site: M19.90

## 2012-12-22 HISTORY — DX: Gout, unspecified: M10.9

## 2012-12-22 LAB — PRO B NATRIURETIC PEPTIDE: Pro B Natriuretic peptide (BNP): 3419 pg/mL — ABNORMAL HIGH (ref 0–125)

## 2012-12-22 LAB — COMPREHENSIVE METABOLIC PANEL
ALT: 24 U/L (ref 0–53)
Alkaline Phosphatase: 94 U/L (ref 39–117)
BUN: 25 mg/dL — ABNORMAL HIGH (ref 6–23)
Chloride: 105 mEq/L (ref 96–112)
GFR calc Af Amer: 39 mL/min — ABNORMAL LOW (ref >90)
Glucose, Bld: 136 mg/dL — ABNORMAL HIGH (ref 70–99)
Potassium: 4.4 mEq/L (ref 3.5–5.1)
Sodium: 142 mEq/L (ref 135–145)
Total Bilirubin: 1.3 mg/dL — ABNORMAL HIGH (ref 0.3–1.2)

## 2012-12-22 LAB — CBC
HCT: 41.8 % (ref 39.0–52.0)
Hemoglobin: 14 g/dL (ref 13.0–17.0)
RBC: 4.22 MIL/uL (ref 4.22–5.81)
WBC: 6.1 10*3/uL (ref 4.0–10.5)

## 2012-12-22 LAB — POCT I-STAT TROPONIN I

## 2012-12-22 MED ORDER — ALBUTEROL SULFATE HFA 108 (90 BASE) MCG/ACT IN AERS: 2.0000 | INHALATION_SPRAY | Freq: Four times a day (QID) | RESPIRATORY_TRACT | Status: DC | PRN

## 2012-12-22 MED ORDER — DEXTROSE-NACL 5-0.45 % IV SOLN
INTRAVENOUS | Status: DC
  Administered 2012-12-22: 18:00:00 50 mL/h via INTRAVENOUS

## 2012-12-22 MED ORDER — OXYCODONE HCL 5 MG PO TABS
5.0000 mg | ORAL_TABLET | ORAL | Status: DC | PRN
  Administered 2012-12-22 – 2012-12-25 (×3): 5 mg via ORAL
  Filled 2012-12-22 (×3): qty 1

## 2012-12-22 MED ORDER — ONDANSETRON HCL 4 MG/2ML IJ SOLN
4.0000 mg | Freq: Once | INTRAMUSCULAR | Status: AC
  Administered 2012-12-22: 4 mg via INTRAVENOUS
  Filled 2012-12-22: qty 2

## 2012-12-22 MED ORDER — SODIUM CHLORIDE 0.9 % IV SOLN
INTRAVENOUS | Status: DC
  Administered 2012-12-22 (×2): via INTRAVENOUS

## 2012-12-22 MED ORDER — HEPARIN SODIUM (PORCINE) 5000 UNIT/ML IJ SOLN: 5000.0000 [IU] | Freq: Three times a day (TID) | INTRAMUSCULAR | Status: DC

## 2012-12-22 MED ORDER — DABIGATRAN ETEXILATE MESYLATE 75 MG PO CAPS
75.0000 mg | ORAL_CAPSULE | Freq: Two times a day (BID) | ORAL | Status: DC
  Administered 2012-12-22 – 2012-12-25 (×6): 75 mg via ORAL
  Filled 2012-12-22 (×7): qty 1

## 2012-12-22 MED ORDER — SODIUM CHLORIDE 0.9 % IJ SOLN
3.0000 mL | Freq: Two times a day (BID) | INTRAMUSCULAR | Status: DC
  Administered 2012-12-22 – 2012-12-25 (×5): 3 mL via INTRAVENOUS

## 2012-12-22 MED ORDER — ALUM & MAG HYDROXIDE-SIMETH 200-200-20 MG/5ML PO SUSP: 30.0000 mL | Freq: Four times a day (QID) | ORAL | Status: DC | PRN

## 2012-12-22 MED ORDER — HYDROMORPHONE HCL PF 1 MG/ML IJ SOLN
1.0000 mg | INTRAMUSCULAR | Status: DC | PRN
  Administered 2012-12-23: 1 mg via INTRAVENOUS
  Filled 2012-12-22: qty 1

## 2012-12-22 MED ORDER — PANTOPRAZOLE SODIUM 40 MG PO TBEC
40.0000 mg | DELAYED_RELEASE_TABLET | Freq: Every day | ORAL | Status: DC
  Administered 2012-12-22 – 2012-12-25 (×4): 40 mg via ORAL
  Filled 2012-12-22 (×4): qty 1

## 2012-12-22 MED ORDER — ONDANSETRON HCL 4 MG/2ML IJ SOLN: 4.0000 mg | Freq: Four times a day (QID) | INTRAMUSCULAR | Status: DC | PRN

## 2012-12-22 MED ORDER — FENTANYL CITRATE 0.05 MG/ML IJ SOLN
50.0000 ug | Freq: Once | INTRAMUSCULAR | Status: AC
  Administered 2012-12-22: 50 ug via INTRAVENOUS
  Filled 2012-12-22: qty 2

## 2012-12-22 MED ORDER — DOCUSATE SODIUM 100 MG PO CAPS
100.0000 mg | ORAL_CAPSULE | Freq: Two times a day (BID) | ORAL | Status: DC
  Administered 2012-12-22 – 2012-12-25 (×6): 100 mg via ORAL
  Filled 2012-12-22 (×7): qty 1

## 2012-12-22 MED ORDER — ONDANSETRON HCL 4 MG PO TABS: 4.0000 mg | ORAL_TABLET | Freq: Four times a day (QID) | ORAL | Status: DC | PRN

## 2012-12-22 MED ORDER — DILTIAZEM HCL 60 MG PO TABS: 120.0000 mg | ORAL_TABLET | Freq: Two times a day (BID) | ORAL | Status: DC

## 2012-12-22 NOTE — H&P (Signed)
[name] is an 62 y.o. male.   Chief Complaint: Right sided chest pain  HPI: 62 years old male with dilated cardiomyopathy has 4 days history of shortness of breath and one day history of weakness, hypotensive post lifting heavy object. Also has nausea post [employer] intake.   Past Medical History  Diagnosis Date  . Hypertension   . GERD (gastroesophageal reflux disease)   . Hiatal hernia   . CHF (congestive heart failure)   . Cardiomyopathy     nonischemic   . Shortness of breath   . Pneumonia     hx of pna  . Atrial fibrillation      previously listed in DC summary from August 31, 2011, ? atrial tachycardia   . COPD (chronic obstructive pulmonary disease)     previously listed in DC summary from Feb 2012   . Chronic kidney disease (CKD), stage II (mild)     listed in chart although patient denies       Past Surgical History  Procedure Laterality Date  . Cardiac catheterization    . Tee without cardioversion N/A 05/11/2012    Procedure: TRANSESOPHAGEAL ECHOCARDIOGRAM (TEE);  Surgeon: Ricki Rodriguez, MD;  Location: [facility] ENDOSCOPY;  Service: Cardiovascular;  Laterality: N/A;  . Cardioversion N/A 05/11/2012    Procedure: Electrocardioversion, external;  Surgeon: Ricki Rodriguez, MD;  Location: [facility] ENDOSCOPY;  Service: Cardiovascular;  Laterality: N/A;  . Cardioversion N/A 05/11/2012    Procedure: CARDIOVERSION AT THE BEDSIDE;  Surgeon: Ricki Rodriguez, MD;  Location: [facility] OR;  Service: Cardiovascular;  Laterality: N/A;    Family History  Problem Relation Age of Onset  . Lung cancer Mother     passed away from lung cancer  . Pneumonia Father     passed sway from PNA.  . Stroke Sister   . Diabetes Sister   . Seizures Brother   . Seizures Brother    Social History:  reports that he quit smoking about 17 months ago. His smoking use included Cigarettes. He has a 3.75 pack-year smoking history. He has never used smokeless tobacco. He reports that he drinks about 4.2 ounces of  alcohol per week. He reports that he uses illicit drugs.  Allergies: No Known Allergies   (Not in a hospital admission)  Results for orders placed during the hospital encounter of 12/22/12 (from the past 48 hour(s))  CBC     Status: None   Collection Time    12/22/12  3:23 PM      Result Value Range   WBC 6.1  4.0 - 10.5 K/uL   RBC 4.22  4.22 - 5.81 MIL/uL   Hemoglobin 14.0  13.0 - 17.0 g/dL   HCT 40.9  81.1 - 91.4 %   MCV 99.1  78.0 - 100.0 fL   MCH 33.2  26.0 - 34.0 pg   MCHC 33.5  30.0 - 36.0 g/dL   RDW 78.2  95.6 - 21.3 %   Platelets 258  150 - 400 K/uL  COMPREHENSIVE METABOLIC PANEL     Status: Abnormal   Collection Time    12/22/12  3:23 PM      Result Value Range   Sodium 142  135 - 145 mEq/L   Potassium 4.4  3.5 - 5.1 mEq/L   Chloride 105  96 - 112 mEq/L   CO2 21  19 - 32 mEq/L   Glucose, Bld 136 (*) 70 - 99 mg/dL   BUN 25 (*) 6 -  23 mg/dL   Creatinine, Ser 7.37 (*) 0.50 - 1.35 mg/dL   Calcium 9.7  8.4 - 10.6 mg/dL   Total Protein 7.1  6.0 - 8.3 g/dL   Albumin 3.5  3.5 - 5.2 g/dL   AST 37  0 - 37 U/L   ALT 24  0 - 53 U/L   Alkaline Phosphatase 94  39 - 117 U/L   Total Bilirubin 1.3 (*) 0.3 - 1.2 mg/dL   GFR calc non Af Amer 33 (*) >90 mL/min   GFR calc Af Amer 39 (*) >90 mL/min   Comment: (NOTE)     The eGFR has been calculated using the CKD EPI equation.     This calculation has not been validated in all clinical situations.     eGFR's persistently <90 mL/min signify possible Chronic Kidney     Disease.  PRO B NATRIURETIC PEPTIDE     Status: Abnormal   Collection Time    12/22/12  3:23 PM      Result Value Range   Pro B Natriuretic peptide (BNP) 3419.0 (*) 0 - 125 pg/mL  POCT I-STAT TROPONIN I     Status: None   Collection Time    12/22/12  4:01 PM      Result Value Range   Troponin i, poc 0.03  0.00 - 0.08 ng/mL   Comment 3            Comment: Due to the release kinetics of cTnI,     a negative result within the first hours     of the onset of  symptoms does not rule out     myocardial infarction with certainty.     If myocardial infarction is still suspected,     repeat the test at appropriate intervals.   Dg Chest Port 1 View  12/22/2012   CLINICAL DATA:  Chest pain  EXAM: PORTABLE CHEST - 1 VIEW  COMPARISON:  05/10/2012  FINDINGS: The cardiac silhouette is enlarged. There is no evidence of focal infiltrates effusions nor edema. Multiple calcified granulomas once again noted throughout the right and left hemithoraces. The visualized osseous structures are unremarkable.  IMPRESSION: 1. Cardiomegaly 2. No evidence of acute cardiopulmonary disease.   Electronically Signed   By: Salome Holmes M.D.   On: 12/22/2012 15:34    ROS Constitutional: Negative for fever and chills.  HENT: Negative for neck pain and neck stiffness.  Eyes: Negative for pain.  Respiratory: Positive for shortness of breath.  Cardiovascular: Positive for chest pain.  Gastrointestinal: Negative for abdominal pain.  Genitourinary: Negative for dysuria.  Musculoskeletal: Positive for back pain. No leg edema  Skin: Negative for rash.  Neurological: Positive for headaches.  Blood pressure 93/62, pulse 49, resp. rate 12, SpO2 100.00%. Height 5\' 9"   Physical exam: Gen: Averagely built and nourished. Some distress from right sid HENT: Head: Normocephalic and atraumatic. Eyes: Brown eyes, Conjunctivae and EOM are normal. Pupils are equal, round, and reactive to light.  Neck: Normal range of motion. Neck supple. Normal carotid pulses and full JVD present. Cardiovascular: S1 normal, S2 normal and irregular pulses. II/VI systolic murmur at left sternal border. Pulmonary/Chest: Lungs clear to auscultation. No tenderness on right sided ribs palpation Abdominal: Bowel sounds are normal. Soft and epi-gastric-tenderness. Musculoskeletal: Normal range of motion. No edema. Neurological: He is alert and oriented to person, place, and time.  Skin: Skin is warm, dry and intact.  No rash noted. No cyanosis. + clubbing.  Psychiatric: Some anxiety  Assessment/Plan Chest pain musculoskeletal Atrial flutter with slow ventricular response Chronic left heart systolic failure  Dilated cardiomyopathy.  S/P SVT ablation x 2   Analgesic/IV fluids/Home medications/Lipase  Desean Heemstra S 12/22/2012, 4:46 PM

## 2012-12-22 NOTE — ED Notes (Signed)
Portable CXR at bedside.

## 2012-12-22 NOTE — ED Notes (Signed)
Pt here from Dr Algie Coffer office c/o right sided CP after lifting heavy metal today; pt appears pale and diaphoretic in PCP office; pt noted to be hypotensive

## 2012-12-22 NOTE — ED Notes (Signed)
Patient presents with c/o right side pain and lower back pain. Onset after lifting heavy metal. Endorses some SOB. No dizziness or headache. Patient has extensive cardiac history. Patient is hypotensive. All other vitals WNL.

## 2012-12-22 NOTE — ED Notes (Signed)
Attempted to phone floor report x 1

## 2012-12-22 NOTE — ED Provider Notes (Signed)
CSN: [id number]     Arrival date & time 12/22/12  1502 History   First MD Initiated Contact with Patient 12/22/12 1512     Chief Complaint  Patient presents with  . Chest Pain   HPI  Patient presents with chest pain.  Pain began after lifting heavy object approximately 2 hours prior to arrival.  Patient notes that prior to that he was having mild dyspnea, mild nausea, generalized fatigue for several days. Since onset of pain the patient has been increasingly uncomfortable.  Patient took his physician's, where he was found to be pale, diaphoretic hypotensive.  No relief with anything.  He was referred here for further evaluation and management.  Past Medical History  Diagnosis Date  . Hypertension   . GERD (gastroesophageal reflux disease)   . Hiatal hernia   . CHF (congestive heart failure)   . Cardiomyopathy     nonischemic   . Shortness of breath   . Pneumonia     hx of pna  . Atrial fibrillation      previously listed in DC summary from August 31, 2011, ? atrial tachycardia   . COPD (chronic obstructive pulmonary disease)     previously listed in DC summary from Feb 2012   . Chronic kidney disease (CKD), stage II (mild)     listed in chart although patient denies    Past Surgical History  Procedure Laterality Date  . Cardiac catheterization    . Tee without cardioversion N/A 05/11/2012    Procedure: TRANSESOPHAGEAL ECHOCARDIOGRAM (TEE);  Surgeon: Ricki Rodriguez, MD;  Location: [facility] ENDOSCOPY;  Service: Cardiovascular;  Laterality: N/A;  . Cardioversion N/A 05/11/2012    Procedure: Electrocardioversion, external;  Surgeon: Ricki Rodriguez, MD;  Location: [facility] ENDOSCOPY;  Service: Cardiovascular;  Laterality: N/A;  . Cardioversion N/A 05/11/2012    Procedure: CARDIOVERSION AT THE BEDSIDE;  Surgeon: Ricki Rodriguez, MD;  Location: [facility] OR;  Service: Cardiovascular;  Laterality: N/A;   Family History  Problem Relation Age of Onset  . Lung cancer Mother     passed away from lung cancer  .  Pneumonia Father     passed sway from PNA.  . Stroke Sister   . Diabetes Sister   . Seizures Brother   . Seizures Brother    History  Substance Use Topics  . Smoking status: Former Smoker -- 0.25 packs/day for 15 years    Types: Cigarettes    Quit date: 07/17/2011  . Smokeless tobacco: Never Used  . Alcohol Use: 4.2 oz/week    7 Cans of beer per week     Comment: social drinking 1-2 beers /month    Review of Systems  Constitutional:       Per HPI, otherwise negative  HENT:       Per HPI, otherwise negative  Respiratory:       Per HPI, otherwise negative  Cardiovascular:       Per HPI, otherwise negative  Gastrointestinal: Negative for vomiting.  Endocrine:       Negative aside from HPI  Genitourinary:       Neg aside from HPI   Musculoskeletal:       Per HPI, otherwise negative  Skin: Negative.   Neurological: Negative for syncope.    Allergies  Review of patient's allergies indicates no known allergies.  Home Medications   Current Outpatient Rx  Name  Route  Sig  Dispense  Refill  . albuterol (PROVENTIL HFA;VENTOLIN HFA) 108 (90 BASE) MCG/ACT  inhaler   Inhalation   Inhale 2 puffs into the lungs every 6 (six) hours as needed. For shortness of breath         . EXPIRED: carvedilol (COREG) 6.25 MG tablet   Oral   Take 1 tablet (6.25 mg total) by mouth 2 (two) times daily with a meal.   60 tablet   1   . dabigatran (PRADAXA) 75 MG CAPS   Oral   Take 1 capsule (75 mg total) by mouth every 12 (twelve) hours.   60 capsule   1   . diltiazem (CARDIZEM) 120 MG tablet   Oral   Take 1 tablet (120 mg total) by mouth 2 (two) times daily.   60 tablet   1   . furosemide (LASIX) 40 MG tablet   Oral   Take 40 mg by mouth daily.           Marland Kitchen HYDROcodone-acetaminophen (NORCO/VICODIN) 5-325 MG per tablet   Oral   Take 1 tablet by mouth at bedtime.   30 tablet   0   . EXPIRED: lisinopril (PRINIVIL,ZESTRIL) 5 MG tablet   Oral   Take 1 tablet (5 mg total) by  mouth daily.   30 tablet   1   . Multiple Vitamins-Minerals (MULTIVITAMINS THER. W/MINERALS) TABS   Oral   Take 0.5 tablets by mouth 2 (two) times daily.          . pantoprazole (PROTONIX) 40 MG tablet   Oral   Take 1 tablet (40 mg total) by mouth daily.   30 tablet   1    BP 86/57  Pulse 55  Resp 26  SpO2 100% Physical Exam  Nursing note and vitals reviewed. Constitutional: He is oriented to person, place, and time. He appears well-developed. No distress.  HENT:  Head: Normocephalic and atraumatic.  Eyes: Conjunctivae and EOM are normal.  Cardiovascular: Normal rate.  An irregular rhythm present.  Pulmonary/Chest: Effort normal. No stridor. No respiratory distress.  Abdominal: He exhibits no distension.  Musculoskeletal: He exhibits no edema.  Neurological: He is alert and oriented to person, place, and time. He displays no atrophy and no tremor. No cranial nerve deficit or sensory deficit. He exhibits normal muscle tone. He displays no seizure activity.  Skin: Skin is warm. He is diaphoretic.  Psychiatric: He has a normal mood and affect.    ED Course  Procedures (including critical care time) Labs Review Labs Reviewed  CBC  COMPREHENSIVE METABOLIC PANEL  PRO B NATRIURETIC PEPTIDE   Imaging Review No results found.  EKG Interpretation     Ventricular Rate:  51 PR Interval:  126 QRS Duration: 128 QT Interval:  512 QTC Calculation: 471 R Axis:   -64 Text Interpretation:  variable rhythm - flutter vs SR Left axis deviation Non-specific intra-ventricular conduction block Cannot rule out Septal infarct , age undetermined T wave abnormality, consider lateral ischemia Abnormal ECG           cardiac monitor 70 irregular abnormal Pulse oximetry 97% room air normal After an initial value of hypotension from the patient's blood pressure improved following initial fluid resuscitation  During the initial eval we discussed the patient's case with his physician  - Dr. Algie Coffer  3:56 PM BP MAP 65 - pain decreased  MDM  No diagnosis found. This patient presents with diaphoresis, pallor, generalized discomfort, and no chest pain.  On exam the patient is awake alert, but appears in a poor condition.  Patient remained hypotensive,  with elevated BNP, and progressive renal dysfunction on labs.  With this completed medical picture, he required admission for further evaluation and management.  After a lengthy discussion with his physician, the patient was admitted.    Gerhard Munch, MD 12/23/12 [phone]

## 2012-12-23 LAB — BASIC METABOLIC PANEL
BUN: 27 mg/dL — ABNORMAL HIGH (ref 6–23)
CO2: 22 mEq/L (ref 19–32)
Calcium: 9.2 mg/dL (ref 8.4–10.5)
Chloride: 105 mEq/L (ref 96–112)
Creatinine, Ser: 1.97 mg/dL — ABNORMAL HIGH (ref 0.50–1.35)
GFR calc Af Amer: 40 mL/min — ABNORMAL LOW (ref >90)
GFR calc non Af Amer: 35 mL/min — ABNORMAL LOW (ref >90)
Sodium: 138 mEq/L (ref 135–145)

## 2012-12-23 MED ORDER — CARVEDILOL 6.25 MG PO TABS
6.2500 mg | ORAL_TABLET | Freq: Two times a day (BID) | ORAL | Status: DC
  Administered 2012-12-23 – 2012-12-25 (×5): 6.25 mg via ORAL
  Filled 2012-12-23 (×7): qty 1

## 2012-12-23 MED ORDER — DILTIAZEM HCL 30 MG PO TABS
30.0000 mg | ORAL_TABLET | Freq: Four times a day (QID) | ORAL | Status: DC
  Administered 2012-12-23 – 2012-12-25 (×10): 30 mg via ORAL
  Filled 2012-12-23 (×13): qty 1

## 2012-12-23 MED ORDER — LISINOPRIL 5 MG PO TABS
5.0000 mg | ORAL_TABLET | Freq: Every day | ORAL | Status: DC
  Administered 2012-12-23 – 2012-12-25 (×2): 5 mg via ORAL
  Filled 2012-12-23 (×3): qty 1

## 2012-12-23 MED ORDER — DILTIAZEM HCL 30 MG PO TABS
30.0000 mg | ORAL_TABLET | Freq: Once | ORAL | Status: AC
  Administered 2012-12-23: 30 mg via ORAL
  Filled 2012-12-23: qty 1

## 2012-12-23 MED ORDER — METOPROLOL TARTRATE 25 MG PO TABS
25.0000 mg | ORAL_TABLET | Freq: Once | ORAL | Status: AC
  Administered 2012-12-23: 18:00:00 25 mg via ORAL
  Filled 2012-12-23: qty 1

## 2012-12-23 NOTE — Progress Notes (Signed)
UR COMPLETED  

## 2012-12-23 NOTE — Progress Notes (Signed)
  Echocardiogram 2D Echocardiogram has been performed.  Arvil Chaco 12/23/2012, 1:45 PM

## 2012-12-23 NOTE — Progress Notes (Signed)
Pt a/o, pt c.o pain at 10 am, PRN 5 mg oxycodone given as ordered, pt put on full liquid diet and tolerated well, diet now advanced to heart healthy diet, pt c/o back pain PRN 1 mg dilaudid given as ordered, will continue to monitor

## 2012-12-23 NOTE — Progress Notes (Signed)
Subjective:  Feeling little better. Hungry. Right sided mid back pain continues. Lipase normal.  Objective:  Vital Signs in the last 24 hours: Temp:  [97.7 F (36.5 C)-98.3 F (36.8 C)] 97.7 F (36.5 C) (11/07 0615) Pulse Rate:  [49-103] 103 (11/07 0615) Cardiac Rhythm:  [-] Atrial fibrillation;Atrial flutter (11/06 1945) Resp:  [12-26] 18 (11/07 0615) BP: (78-122)/(57-80) 120/70 mmHg (11/07 0615) SpO2:  [95 %-100 %] 100 % (11/07 0615) Weight:  [66.452 kg (146 lb 8 oz)] 66.452 kg (146 lb 8 oz) (11/07 0615)  Physical Exam: BP Readings from Last 1 Encounters:  12/23/12 120/70     Wt Readings from Last 1 Encounters:  12/23/12 66.452 kg (146 lb 8 oz)    Weight change:   HEENT: [place]/AT, Eyes-Brown, PERL, EOMI, Conjunctiva-Pink, Sclera-Non-icteric Neck: No JVD, No bruit, Trachea midline. Lungs:  Clear, Bilateral. No tenderness on palpation. Cardiac:  Regular rhythm, normal S1 and S2, no S3. II/VI systolic murmur. Abdomen:  Soft, non-tender. Extremities:  No edema present. No cyanosis. + clubbing. CNS: AxOx3, Cranial nerves grossly intact, moves all 4 extremities. Right handed. Skin: Warm and dry.   Intake/Output from previous day: 11/06 0701 - 11/07 0700 In: -  Out: 325 [Urine:325]    Lab Results: BMET    Component Value Date/Time   NA 138 12/23/2012 0540   K 3.8 12/23/2012 0540   CL 105 12/23/2012 0540   CO2 22 12/23/2012 0540   GLUCOSE 99 12/23/2012 0540   BUN 27* 12/23/2012 0540   CREATININE 1.97* 12/23/2012 0540   CALCIUM 9.2 12/23/2012 0540   GFRNONAA 35* 12/23/2012 0540   GFRAA 40* 12/23/2012 0540   CBC    Component Value Date/Time   WBC 6.1 12/22/2012 1523   RBC 4.22 12/22/2012 1523   HGB 14.0 12/22/2012 1523   HCT 41.8 12/22/2012 1523   PLT 258 12/22/2012 1523   MCV 99.1 12/22/2012 1523   MCH 33.2 12/22/2012 1523   MCHC 33.5 12/22/2012 1523   RDW 13.1 12/22/2012 1523   LYMPHSABS 1.6 05/10/2012 1522   MONOABS 0.6 05/10/2012 1522   EOSABS 0.3 05/10/2012 1522   BASOSABS  0.1 05/10/2012 1522   CARDIAC ENZYMES Lab Results  Component Value Date   CKTOTAL 65 09/08/2011   CKMB 3.5 09/08/2011   TROPONINI <0.30 05/11/2012    Scheduled Meds: . carvedilol  6.25 mg Oral BID WC  . dabigatran  75 mg Oral BID  . diltiazem  30 mg Oral QID  . docusate sodium  100 mg Oral BID  . lisinopril  5 mg Oral Daily  . pantoprazole  40 mg Oral Daily  . sodium chloride  3 mL Intravenous Q12H   Continuous Infusions: . sodium chloride 20 mL/hr at 12/22/12 1718  . dextrose 5 % and 0.45% NaCl 50 mL/hr (12/22/12 1813)   PRN Meds:.albuterol, alum & mag hydroxide-simeth, HYDROmorphone (DILAUDID) injection, ondansetron (ZOFRAN) IV, ondansetron, oxyCODONE  Assessment/Plan:  Chest pain musculoskeletal  Nausea, vomiting -resolving Atrial flutter with slow ventricular response  Chronic left heart systolic failure  Dilated cardiomyopathy.  S/P SVT ablation x 2  CKD, II  Start full liquid diet   LOS: 1 day    [name]  MD  12/23/2012, 9:45 AM

## 2012-12-23 NOTE — Progress Notes (Signed)
pts HR was sustaining in 140's, pt asymptomatic BP 121/78, Dr. Algie Coffer notified and ordered 1 x 25mg  metoprolol and 1 x 30 mg cardizem, pt stable, HR now in low 100's, will continue to monitor

## 2012-12-24 LAB — BASIC METABOLIC PANEL
BUN: 26 mg/dL — ABNORMAL HIGH (ref 6–23)
CO2: 19 mEq/L (ref 19–32)
Chloride: 101 mEq/L (ref 96–112)
GFR calc Af Amer: 45 mL/min — ABNORMAL LOW (ref >90)
GFR calc non Af Amer: 39 mL/min — ABNORMAL LOW (ref >90)
Potassium: 4.6 mEq/L (ref 3.5–5.1)

## 2012-12-24 LAB — CBC
HCT: 39 % (ref 39.0–52.0)
MCHC: 33.3 g/dL (ref 30.0–36.0)
Platelets: 233 10*3/uL (ref 150–400)
RBC: 3.91 MIL/uL — ABNORMAL LOW (ref 4.22–5.81)
RDW: 12.7 % (ref 11.5–15.5)

## 2012-12-24 MED ORDER — ACETAMINOPHEN 325 MG PO TABS
650.0000 mg | ORAL_TABLET | Freq: Four times a day (QID) | ORAL | Status: DC | PRN
  Administered 2012-12-24: 650 mg via ORAL
  Filled 2012-12-24: qty 2

## 2012-12-24 MED ORDER — FUROSEMIDE 10 MG/ML IJ SOLN
40.0000 mg | Freq: Once | INTRAMUSCULAR | Status: AC
  Administered 2012-12-24: 40 mg via INTRAVENOUS
  Filled 2012-12-24: qty 4

## 2012-12-24 NOTE — Progress Notes (Signed)
Pt a/o, c/o shoulder pain, PRN tylenol given, pt got oob and moved around room, pt now resting in bed, 1 x 40 mg lasix given, will continue to monitor

## 2012-12-24 NOTE — Progress Notes (Signed)
Pt ambulated in the hallway x 2, pt states he feels better, tolerated walk well

## 2012-12-24 NOTE — Progress Notes (Signed)
Subjective:  RUQ, epigastric and right shoulder pain. No fever. Decreasing nausea  Objective:  Vital Signs in the last 24 hours: Temp:  [97.3 F (36.3 C)-98.2 F (36.8 C)] 97.4 F (36.3 C) (11/08 0619) Pulse Rate:  [98-140] 102 (11/08 1047) Cardiac Rhythm:  [-] Sinus tachycardia (11/08 0844) Resp:  [16-20] 20 (11/08 0619) BP: (100-121)/(66-92) 100/66 mmHg (11/08 1047) SpO2:  [90 %-98 %] 95 % (11/08 0619) Weight:  [67.405 kg (148 lb 9.6 oz)] 67.405 kg (148 lb 9.6 oz) (11/08 4098)  Physical Exam: BP Readings from Last 1 Encounters:  12/24/12 100/66     Wt Readings from Last 1 Encounters:  12/24/12 67.405 kg (148 lb 9.6 oz)    Weight change: 0.953 kg (2 lb 1.6 oz)  HEENT: [place]/AT, Eyes-Brown, PERL, EOMI, Conjunctiva-Pink, Sclera-Non-icteric Neck: No JVD, No bruit, Trachea midline. Lungs:  Clear, Bilateral. Cardiac:  Regular rhythm, normal S1 and S2, no S3. II/VI systolic murmur Abdomen:  Soft, mild epigastric-tenderness. Extremities:  No edema present. No cyanosis. Positive clubbing. CNS: AxOx3, Cranial nerves grossly intact, moves all 4 extremities. Right handed. Skin: Warm and dry.   Intake/Output from previous day: 11/07 0701 - 11/08 0700 In: 960 [P.O.:960] Out: 851 [Urine:851]    Lab Results: BMET    Component Value Date/Time   NA 133* 12/24/2012 0421   K 4.6 12/24/2012 0421   CL 101 12/24/2012 0421   CO2 19 12/24/2012 0421   GLUCOSE 87 12/24/2012 0421   BUN 26* 12/24/2012 0421   CREATININE 1.80* 12/24/2012 0421   CALCIUM 9.3 12/24/2012 0421   GFRNONAA 39* 12/24/2012 0421   GFRAA 45* 12/24/2012 0421   CBC    Component Value Date/Time   WBC 6.3 12/24/2012 0421   RBC 3.91* 12/24/2012 0421   HGB 13.0 12/24/2012 0421   HCT 39.0 12/24/2012 0421   PLT 233 12/24/2012 0421   MCV 99.7 12/24/2012 0421   MCH 33.2 12/24/2012 0421   MCHC 33.3 12/24/2012 0421   RDW 12.7 12/24/2012 0421   LYMPHSABS 1.6 05/10/2012 1522   MONOABS 0.6 05/10/2012 1522   EOSABS 0.3 05/10/2012 1522   BASOSABS 0.1 05/10/2012 1522   CARDIAC ENZYMES Lab Results  Component Value Date   CKTOTAL 65 09/08/2011   CKMB 3.5 09/08/2011   TROPONINI <0.30 05/11/2012    Scheduled Meds: . carvedilol  6.25 mg Oral BID WC  . dabigatran  75 mg Oral BID  . diltiazem  30 mg Oral QID  . docusate sodium  100 mg Oral BID  . furosemide  40 mg Intravenous Once  . lisinopril  5 mg Oral Daily  . pantoprazole  40 mg Oral Daily  . sodium chloride  3 mL Intravenous Q12H   Continuous Infusions: . sodium chloride 20 mL/hr at 12/22/12 1718  . dextrose 5 % and 0.45% NaCl 20 mL/hr at 12/23/12 1009   PRN Meds:.acetaminophen, albuterol, alum & mag hydroxide-simeth, HYDROmorphone (DILAUDID) injection, ondansetron (ZOFRAN) IV, ondansetron, oxyCODONE  Assessment/Plan: Chest pain musculoskeletal  Abdominal pain Nausea, vomiting -resolving  Atrial flutter with slow ventricular response  Chronic left heart systolic failure  Dilated cardiomyopathy.  S/P SVT ablation x 2  CKD, II  Ultrasound of abdomen. Advance diet as tolerated. One dose IV lasix.   LOS: 2 days    [name]  MD  12/24/2012, 11:55 AM

## 2012-12-25 ENCOUNTER — Observation Stay (HOSPITAL_COMMUNITY): Payer: Medicare Other

## 2012-12-25 LAB — BASIC METABOLIC PANEL
BUN: 24 mg/dL — ABNORMAL HIGH (ref 6–23)
CO2: 22 mEq/L (ref 19–32)
Calcium: 9 mg/dL (ref 8.4–10.5)
Chloride: 103 mEq/L (ref 96–112)
Creatinine, Ser: 1.72 mg/dL — ABNORMAL HIGH (ref 0.50–1.35)
GFR calc Af Amer: 47 mL/min — ABNORMAL LOW (ref >90)

## 2012-12-25 MED ORDER — FUROSEMIDE 10 MG/ML IJ SOLN
40.0000 mg | Freq: Once | INTRAMUSCULAR | Status: AC
  Administered 2012-12-25: 40 mg via INTRAVENOUS
  Filled 2012-12-25: qty 4

## 2012-12-25 MED ORDER — OXYCODONE HCL 5 MG PO TABS: 5.0000 mg | ORAL_TABLET | Freq: Every day | ORAL | Status: DC

## 2012-12-25 NOTE — Discharge Summary (Signed)
Physician Discharge Summary  Patient ID: [name] MRN: [id number] DOB/AGE: Dec 17, 1950 62 y.o.  Admit date: 12/22/2012 Discharge date: 12/25/2012  Admission Diagnoses: Chest pain musculoskeletal  Abdominal pain  Nausea, vomiting Atrial flutter with slow ventricular response  Chronic left heart systolic failure  Dilated cardiomyopathy.  S/P SVT ablation x 2  CKD, II  Discharge Diagnoses:  Principle Problem: * Chest pain musculoskeletal * Abdominal pain due to constipation Nausea, vomiting   Atrial flutter with slow ventricular response  Chronic left heart systolic failure  Dilated cardiomyopathy.  S/P SVT ablation x 2  CKD, II Moderate pulmonary systolic hypertension  Discharged Condition: fair  Hospital Course: 62 years old male with dilated cardiomyopathy has 4 days history of shortness of breath and one day history of weakness, hypotensive post lifting heavy object. Also has nausea post [employer] intake. He responded to gentle hydration, bowel rest and pain medications. He was discharged home in stable condition with In future he will need oxygen therapy and ICD.  Consults: cardiology  Significant Diagnostic Studies: labs: Normal CBC and electrolytes. Elevated BUN/Cr improved with mild hydration. Chronic elevation of Pro BNP.   Chest X-ray: 1. Cardiomegaly 2. No evidence of acute cardiopulmonary disease.  Ultrasound of abdomen: Increased cortical echogenicity of both kidneys is likely consistent with chronic kidney disease. No hydronephrosis is identified. Nonobstructing calculi are present in the left kidney. No GB stones or dosease.  2-D echocardiogram:- Left ventricle: The cavity size was normal. There was mild concentric hypertrophy. Systolic function was moderately reduced. The estimated ejection fraction was in the range of 35% to 40%. Moderate hypokinesis of the entire inferoseptal myocardium. There is moderate hypokinesis of the inferior myocardium. -  Mitral valve: Severe regurgitation directed eccentrically and toward the free wall. - Left atrium: The atrium was moderately dilated. - Right ventricle: The cavity size was mildly dilated. Wall thickness was normal. Systolic function was mildly reduced. - Right atrium: The atrium was mildly dilated. - Pulmonary arteries: Systolic pressure was moderately to severely increased. PA peak pressure: 61mm Hg (S).   Treatments: IV hydration, analgesia: Dilaudid and Oxycodone and cardiac meds: lisinopril (generic), carvedilol, diltiazem, furosemide and Pradaxa.  Discharge Exam: Blood pressure 117/83, pulse 101, temperature 98 F (36.7 C), temperature source Oral, resp. rate 20, height 5\' 9"  (1.753 m), weight 66.5 kg (146 lb 9.7 oz), SpO2 95.00%. HEENT: [place]/AT, Eyes-Brown, PERL, EOMI, Conjunctiva-Pink, Sclera-Non-icteric  Neck: No JVD, No bruit, Trachea midline.  Lungs: Clear, Bilateral.  Cardiac: Regular rhythm, normal S1 and S2, no S3. II/VI systolic murmur  Abdomen: Soft, mild epigastric-tenderness.  Extremities: No edema present. No cyanosis. Positive clubbing.  CNS: AxOx3, Cranial nerves grossly intact, moves all 4 extremities. Right handed.  Skin: Warm and dry.  Disposition: 01-Home or Self Care     Medication List         albuterol 108 (90 BASE) MCG/ACT inhaler  Commonly known as:  PROVENTIL HFA;VENTOLIN HFA  Inhale 2 puffs into the lungs every 6 (six) hours as needed. For shortness of breath     carvedilol 6.25 MG tablet  Commonly known as:  COREG  Take 6.25 mg by mouth 2 (two) times daily with a meal.     dabigatran 75 MG Caps capsule  Commonly known as:  PRADAXA  Take 75 mg by mouth 2 (two) times daily.     diltiazem 120 MG tablet  Commonly known as:  CARDIZEM  Take 120 mg by mouth 2 (two) times daily.  furosemide 40 MG tablet  Commonly known as:  LASIX  Take 40 mg by mouth daily.     lisinopril 5 MG tablet  Commonly known as:  PRINIVIL,ZESTRIL  Take 5 mg by mouth  daily.     multivitamins ther. w/minerals Tabs tablet  Take 0.5 tablets by mouth 2 (two) times daily.     oxyCODONE 5 MG immediate release tablet  Commonly known as:  Oxy IR/ROXICODONE  Take 1 tablet (5 mg total) by mouth at bedtime.     pantoprazole 40 MG tablet  Commonly known as:  PROTONIX  Take 40 mg by mouth daily.         SignedOrpah Cobb S 12/25/2012, 2:50 PM

## 2012-12-25 NOTE — Progress Notes (Signed)
Patient's IV and telemetry has been discontinued, patient verbalizes understanding of discharge instructions and is being discharged home.  D. Larri Brewton RN 

## 2012-12-25 NOTE — Progress Notes (Signed)
Utilization Review completed.  

## 2013-03-23 ENCOUNTER — Inpatient Hospital Stay (HOSPITAL_COMMUNITY)
Admission: EM | Admit: 2013-03-23 | Discharge: 2013-03-29 | DRG: 292 | Disposition: A | Discharge status: Home / Self Care | Payer: Medicare HMO | Attending: Cardiovascular Disease | Admitting: Cardiovascular Disease

## 2013-03-23 ENCOUNTER — Emergency Department (HOSPITAL_COMMUNITY): Payer: Medicare HMO

## 2013-03-23 ENCOUNTER — Encounter (HOSPITAL_COMMUNITY): Payer: Self-pay | Admitting: Emergency Medicine

## 2013-03-23 ENCOUNTER — Other Ambulatory Visit: Payer: Self-pay

## 2013-03-23 DIAGNOSIS — K449 Diaphragmatic hernia without obstruction or gangrene: Secondary | ICD-10-CM | POA: Diagnosis present

## 2013-03-23 DIAGNOSIS — Z823 Family history of stroke: Secondary | ICD-10-CM

## 2013-03-23 DIAGNOSIS — I129 Hypertensive chronic kidney disease with stage 1 through stage 4 chronic kidney disease, or unspecified chronic kidney disease: Secondary | ICD-10-CM | POA: Diagnosis present

## 2013-03-23 DIAGNOSIS — M109 Gout, unspecified: Secondary | ICD-10-CM | POA: Diagnosis present

## 2013-03-23 DIAGNOSIS — Z801 Family history of malignant neoplasm of trachea, bronchus and lung: Secondary | ICD-10-CM

## 2013-03-23 DIAGNOSIS — Z87891 Personal history of nicotine dependence: Secondary | ICD-10-CM

## 2013-03-23 DIAGNOSIS — F329 Major depressive disorder, single episode, unspecified: Secondary | ICD-10-CM | POA: Diagnosis present

## 2013-03-23 DIAGNOSIS — I509 Heart failure, unspecified: Secondary | ICD-10-CM | POA: Diagnosis present

## 2013-03-23 DIAGNOSIS — Z833 Family history of diabetes mellitus: Secondary | ICD-10-CM

## 2013-03-23 DIAGNOSIS — I428 Other cardiomyopathies: Secondary | ICD-10-CM | POA: Diagnosis present

## 2013-03-23 DIAGNOSIS — I4891 Unspecified atrial fibrillation: Secondary | ICD-10-CM | POA: Diagnosis present

## 2013-03-23 DIAGNOSIS — I5023 Acute on chronic systolic (congestive) heart failure: Principal | ICD-10-CM

## 2013-03-23 DIAGNOSIS — K219 Gastro-esophageal reflux disease without esophagitis: Secondary | ICD-10-CM | POA: Diagnosis present

## 2013-03-23 DIAGNOSIS — Z79899 Other long term (current) drug therapy: Secondary | ICD-10-CM

## 2013-03-23 DIAGNOSIS — J4489 Other specified chronic obstructive pulmonary disease: Secondary | ICD-10-CM | POA: Diagnosis present

## 2013-03-23 DIAGNOSIS — N184 Chronic kidney disease, stage 4 (severe): Secondary | ICD-10-CM | POA: Diagnosis present

## 2013-03-23 DIAGNOSIS — I2789 Other specified pulmonary heart diseases: Secondary | ICD-10-CM | POA: Diagnosis present

## 2013-03-23 DIAGNOSIS — J449 Chronic obstructive pulmonary disease, unspecified: Secondary | ICD-10-CM | POA: Diagnosis present

## 2013-03-23 DIAGNOSIS — I4892 Unspecified atrial flutter: Secondary | ICD-10-CM | POA: Diagnosis present

## 2013-03-23 DIAGNOSIS — F3289 Other specified depressive episodes: Secondary | ICD-10-CM | POA: Diagnosis present

## 2013-03-23 LAB — CBC
HCT: 42.8 % (ref 39.0–52.0)
HEMOGLOBIN: 14.6 g/dL (ref 13.0–17.0)
MCH: 33.7 pg (ref 26.0–34.0)
MCHC: 34.1 g/dL (ref 30.0–36.0)
MCV: 98.8 fL (ref 78.0–100.0)
Platelets: 208 10*3/uL (ref 150–400)
RBC: 4.33 MIL/uL (ref 4.22–5.81)
RDW: 13.6 % (ref 11.5–15.5)
WBC: 5.5 10*3/uL (ref 4.0–10.5)

## 2013-03-23 LAB — BASIC METABOLIC PANEL
BUN: 26 mg/dL — ABNORMAL HIGH (ref 6–23)
CO2: 19 meq/L (ref 19–32)
Calcium: 9.8 mg/dL (ref 8.4–10.5)
Chloride: 103 mEq/L (ref 96–112)
Creatinine, Ser: 1.7 mg/dL — ABNORMAL HIGH (ref 0.50–1.35)
GFR calc Af Amer: 48 mL/min — ABNORMAL LOW (ref >90)
GFR, EST NON AFRICAN AMERICAN: 41 mL/min — AB (ref >90)
Glucose, Bld: 73 mg/dL (ref 70–99)
Potassium: 4.6 mEq/L (ref 3.7–5.3)
SODIUM: 141 meq/L (ref 137–147)

## 2013-03-23 LAB — MRSA PCR SCREENING: MRSA by PCR: NEGATIVE

## 2013-03-23 LAB — PRO B NATRIURETIC PEPTIDE: Pro B Natriuretic peptide (BNP): 7217 pg/mL — ABNORMAL HIGH (ref 0–125)

## 2013-03-23 LAB — POCT I-STAT TROPONIN I: Troponin i, poc: 0.05 ng/mL (ref 0.00–0.08)

## 2013-03-23 LAB — TROPONIN I: Troponin I: <0.3 ng/mL (ref <0.30)

## 2013-03-23 LAB — PROTIME-INR
INR: 1.4 (ref 0.00–1.49)
PROTHROMBIN TIME: 16.8 s — AB (ref 11.6–15.2)

## 2013-03-23 MED ORDER — LISINOPRIL 20 MG PO TABS
20.0000 mg | ORAL_TABLET | Freq: Every day | ORAL | Status: DC
  Administered 2013-03-24: 20 mg via ORAL
  Filled 2013-03-23 (×2): qty 1

## 2013-03-23 MED ORDER — ONDANSETRON HCL 4 MG/2ML IJ SOLN: 4.0000 mg | Freq: Four times a day (QID) | INTRAMUSCULAR | Status: DC | PRN

## 2013-03-23 MED ORDER — ALBUTEROL SULFATE HFA 108 (90 BASE) MCG/ACT IN AERS: 2.0000 | INHALATION_SPRAY | Freq: Four times a day (QID) | RESPIRATORY_TRACT | Status: DC | PRN

## 2013-03-23 MED ORDER — OXYCODONE HCL 5 MG PO TABS
5.0000 mg | ORAL_TABLET | Freq: Four times a day (QID) | ORAL | Status: DC | PRN
  Administered 2013-03-23 – 2013-03-28 (×9): 5 mg via ORAL
  Filled 2013-03-23 (×11): qty 1

## 2013-03-23 MED ORDER — SODIUM CHLORIDE 0.9 % IJ SOLN
3.0000 mL | Freq: Two times a day (BID) | INTRAMUSCULAR | Status: DC
  Administered 2013-03-23 – 2013-03-24 (×2): 3 mL via INTRAVENOUS
  Administered 2013-03-25: 10 mL via INTRAVENOUS
  Administered 2013-03-25: 3 mL via INTRAVENOUS
  Administered 2013-03-26: 09:00:00 via INTRAVENOUS
  Administered 2013-03-27 – 2013-03-28 (×4): 3 mL via INTRAVENOUS

## 2013-03-23 MED ORDER — COLCHICINE 0.6 MG PO TABS
0.6000 mg | ORAL_TABLET | Freq: Two times a day (BID) | ORAL | Status: DC | PRN
  Administered 2013-03-28: 0.6 mg via ORAL
  Filled 2013-03-23 (×3): qty 1

## 2013-03-23 MED ORDER — NITROGLYCERIN IN D5W 200-5 MCG/ML-% IV SOLN
2.0000 ug/min | INTRAVENOUS | Status: DC
  Administered 2013-03-23: 10 ug/min via INTRAVENOUS
  Filled 2013-03-23: qty 250

## 2013-03-23 MED ORDER — CARVEDILOL 6.25 MG PO TABS
6.2500 mg | ORAL_TABLET | Freq: Two times a day (BID) | ORAL | Status: DC
  Administered 2013-03-23 – 2013-03-24 (×2): 6.25 mg via ORAL
  Filled 2013-03-23 (×6): qty 1

## 2013-03-23 MED ORDER — PANTOPRAZOLE SODIUM 40 MG PO TBEC
40.0000 mg | DELAYED_RELEASE_TABLET | Freq: Every day | ORAL | Status: DC
  Administered 2013-03-24 – 2013-03-29 (×6): 40 mg via ORAL
  Filled 2013-03-23 (×7): qty 1

## 2013-03-23 MED ORDER — SODIUM CHLORIDE 0.9 % IJ SOLN: 3.0000 mL | INTRAMUSCULAR | Status: DC | PRN

## 2013-03-23 MED ORDER — FUROSEMIDE 40 MG PO TABS
40.0000 mg | ORAL_TABLET | Freq: Every day | ORAL | Status: DC
  Filled 2013-03-23 (×2): qty 1

## 2013-03-23 MED ORDER — ATORVASTATIN CALCIUM 10 MG PO TABS
10.0000 mg | ORAL_TABLET | ORAL | Status: DC
  Administered 2013-03-24 – 2013-03-27 (×2): 10 mg via ORAL
  Filled 2013-03-23 (×3): qty 1

## 2013-03-23 MED ORDER — MORPHINE SULFATE 2 MG/ML IJ SOLN
2.0000 mg | INTRAMUSCULAR | Status: DC | PRN
  Administered 2013-03-23 – 2013-03-29 (×9): 2 mg via INTRAVENOUS
  Filled 2013-03-23 (×9): qty 1

## 2013-03-23 MED ORDER — SODIUM CHLORIDE 0.9 % IV SOLN: 250.0000 mL | INTRAVENOUS | Status: DC | PRN

## 2013-03-23 MED ORDER — DABIGATRAN ETEXILATE MESYLATE 75 MG PO CAPS
75.0000 mg | ORAL_CAPSULE | Freq: Two times a day (BID) | ORAL | Status: DC
  Administered 2013-03-23 – 2013-03-29 (×12): 75 mg via ORAL
  Filled 2013-03-23 (×14): qty 1

## 2013-03-23 MED ORDER — ALBUTEROL SULFATE (2.5 MG/3ML) 0.083% IN NEBU: 2.5000 mg | INHALATION_SOLUTION | Freq: Four times a day (QID) | RESPIRATORY_TRACT | Status: DC | PRN

## 2013-03-23 MED ORDER — NON FORMULARY: 5.0000 mg | Freq: Every day | Status: DC

## 2013-03-23 MED ORDER — FUROSEMIDE 10 MG/ML IJ SOLN
40.0000 mg | INTRAMUSCULAR | Status: AC
  Administered 2013-03-23: 40 mg via INTRAVENOUS
  Filled 2013-03-23: qty 4

## 2013-03-23 MED ORDER — DILTIAZEM HCL 60 MG PO TABS
120.0000 mg | ORAL_TABLET | Freq: Two times a day (BID) | ORAL | Status: DC
  Administered 2013-03-23 – 2013-03-29 (×12): 120 mg via ORAL
  Filled 2013-03-23 (×14): qty 2

## 2013-03-23 MED ORDER — ACETAMINOPHEN 325 MG PO TABS
650.0000 mg | ORAL_TABLET | ORAL | Status: DC | PRN
  Administered 2013-03-24: 650 mg via ORAL
  Filled 2013-03-23: qty 2

## 2013-03-23 NOTE — ED Notes (Signed)
Pt reports having chest pains, sob, headache, dizziness, lightheaded, chills. Denies cough or n/v/d. ekg done at triage.

## 2013-03-23 NOTE — ED Provider Notes (Signed)
Patient reports he started getting chest pain today indicates 2 areas one on each side of his chest and shortness of breath that started last night. He denies any swelling in his extremities. He has a history of atrial fib and SVT.  Patient is alert cooperative, he appears to have some tachypnea.  Medical screening examination/treatment/procedure(s) were conducted as a shared visit with non-physician practitioner(s) and myself.  I personally evaluated the patient during the encounter.  EKG Interpretation    Date/Time:  Thursday March 23 2013 13:57:47 EST Ventricular Rate:  96 PR Interval:    QRS Duration: 134 QT Interval:  444 QTC Calculation: 560 R Axis:   -68 Text Interpretation:   Critical Test Result: Long QTc Atrial flutter with 2:1 A-V conduction Left axis deviation Non-specific intra-ventricular conduction block Cannot rule out Septal infarct , age undetermined Since last tracing 22 Dec 2012 T wave abnormality, consider lateral ischemia Confirmed by [place]  MD-I, Mario Voong ([id number]) on 03/23/2013 4:34:08 PM             Rolland Porter, MD, Alanson Aly, MD 03/23/13 [phone]

## 2013-03-23 NOTE — ED Provider Notes (Signed)
CSN: [id number]     Arrival date & time 03/23/13  1354 History   First MD Initiated Contact with Patient 03/23/13 1408     Chief Complaint  Patient presents with  . Shortness of Breath  . Chest Pain  . Influenza   (Consider location/radiation/quality/duration/timing/severity/associated sxs/prior Treatment) HPI Comments: Patient presents to the emergency department with chief complaints of chest pain, shortness of breath, and generalized body aches. He states that the symptoms have been ongoing for the past 3 days. He states that he has not taken anything to alleviate her symptoms. He has a history of A. fib, SVT. He has had 2 ablations. He is followed by [name]. He states that his pain is 5/10. It does not radiate. He states that he becomes more short of breath when he lies back. He also has a history of CHF. He denies fevers, chills, nausea, vomiting, diarrhea, or constipation.  The history is provided by the patient. No language interpreter was used.    Past Medical History  Diagnosis Date  . Hypertension   . GERD (gastroesophageal reflux disease)   . Hiatal hernia   . CHF (congestive heart failure)   . Cardiomyopathy     nonischemic   . COPD (chronic obstructive pulmonary disease)     previously listed in DC summary from Feb 2012   . Chronic kidney disease (CKD), stage II (mild)     listed in chart although patient denies   . Atrial fibrillation      previously listed in DC summary from August 31, 2011, ? atrial tachycardia   . SVT (supraventricular tachycardia)   . Heart murmur     "dx'd in middle school" (12/22/2012)  . Pneumonia     "once when I was a baby; probably 3-4 times since then" (12/22/2012)  . Shortness of breath     "all the time last couple months" (12/22/2012)  . [id number])     "weekly" (01/01/2013)  . Arthritis     "neck" (12/22/2012)  . Gout   . Depression    Past Surgical History  Procedure Laterality Date  . Tee without cardioversion N/A  05/11/2012    Procedure: TRANSESOPHAGEAL ECHOCARDIOGRAM (TEE);  Surgeon: Birdie Riddle, MD;  Location: [facility] ENDOSCOPY;  Service: Cardiovascular;  Laterality: N/A;  . Cardioversion N/A 05/11/2012    Procedure: Electrocardioversion, external;  Surgeon: Birdie Riddle, MD;  Location: [place];  Service: Cardiovascular;  Laterality: N/A;  . Cardioversion N/A 05/11/2012    Procedure: CARDIOVERSION AT THE BEDSIDE;  Surgeon: Birdie Riddle, MD;  Location: [place];  Service: Cardiovascular;  Laterality: N/A;  . Supraventricular tachycardia ablation  08/2011; 09/2011  . Cardiac catheterization  08/2011   Family History  Problem Relation Age of Onset  . Lung cancer Mother     passed away from lung cancer  . Pneumonia Father     passed sway from PNA.  . Stroke Sister   . Diabetes Sister   . Seizures Brother   . Seizures Brother    History  Substance Use Topics  . Smoking status: Former Smoker -- 0.25 packs/day for 15 years    Types: Cigarettes    Quit date: 07/17/2011  . Smokeless tobacco: Never Used  . Alcohol Use: 3.6 oz/week    6 Cans of beer per week     Comment: 12/22/2012 1, ~ 24oz beer q other day at most"    Review of Systems  All other systems reviewed and are negative.  Allergies  Review of patient's allergies indicates no known allergies.  Home Medications   Current Outpatient Rx  Name  Route  Sig  Dispense  Refill  . albuterol (PROVENTIL HFA;VENTOLIN HFA) 108 (90 BASE) MCG/ACT inhaler   Inhalation   Inhale 2 puffs into the lungs every 6 (six) hours as needed. For shortness of breath         . carvedilol (COREG) 6.25 MG tablet   Oral   Take 6.25 mg by mouth 2 (two) times daily with a meal.         . dabigatran (PRADAXA) 75 MG CAPS capsule   Oral   Take 75 mg by mouth 2 (two) times daily.         Marland Kitchen diltiazem (CARDIZEM) 120 MG tablet   Oral   Take 120 mg by mouth 2 (two) times daily.         . furosemide (LASIX) 40 MG tablet   Oral   Take 40 mg by mouth  daily.           Marland Kitchen lisinopril (PRINIVIL,ZESTRIL) 5 MG tablet   Oral   Take 5 mg by mouth daily.         . Multiple Vitamins-Minerals (MULTIVITAMINS THER. W/MINERALS) TABS   Oral   Take 0.5 tablets by mouth 2 (two) times daily.          Marland Kitchen oxyCODONE (OXY IR/ROXICODONE) 5 MG immediate release tablet   Oral   Take 1 tablet (5 mg total) by mouth at bedtime.   30 tablet   0   . pantoprazole (PROTONIX) 40 MG tablet   Oral   Take 40 mg by mouth daily.          BP 109/74  Pulse 98  Temp(Src) 97.4 F (36.3 C) (Axillary)  Resp 26  SpO2 99% Physical Exam  Nursing note and vitals reviewed. Constitutional: He is oriented to person, place, and time. He appears well-developed and well-nourished.  HENT:  Head: Normocephalic and atraumatic.  Eyes: Conjunctivae and EOM are normal. Pupils are equal, round, and reactive to light. Right eye exhibits no discharge. Left eye exhibits no discharge. No scleral icterus.  Neck: Normal range of motion. Neck supple. No JVD present.  Cardiovascular: Normal rate, regular rhythm, normal heart sounds and intact distal pulses.  Exam reveals no gallop and no friction rub.   No murmur heard. Pulmonary/Chest: Effort normal and breath sounds normal. No respiratory distress. He has no wheezes. He has no rales. He exhibits no tenderness.  Lungs are clear bilaterally  Abdominal: Soft. He exhibits no distension and no mass. There is no tenderness. There is no rebound and no guarding.  No focal abdominal tenderness  Musculoskeletal: Normal range of motion. He exhibits no edema and no tenderness.  No peripheral edema  Neurological: He is alert and oriented to person, place, and time.  Skin: Skin is warm and dry.  Psychiatric: He has a normal mood and affect. His behavior is normal. Judgment and thought content normal.    ED Course  Procedures (including critical care time) Results for orders placed during the hospital encounter of 03/23/13  CBC       Result Value Range   WBC 5.5  4.0 - 10.5 K/uL   RBC 4.33  4.22 - 5.81 MIL/uL   Hemoglobin 14.6  13.0 - 17.0 g/dL   HCT 42.8  39.0 - 52.0 %   MCV 98.8  78.0 - 100.0 fL   MCH 33.7  26.0 - 34.0 pg   MCHC 34.1  30.0 - 36.0 g/dL   RDW 13.6  11.5 - 15.5 %   Platelets 208  150 - 400 K/uL   Dg Chest 2 View  03/23/2013   CLINICAL DATA:  Shortness of breath and chest pain  EXAM: CHEST  2 VIEW  COMPARISON:  December 22, 2012  FINDINGS: There is no edema or consolidation. No effusions. There is stable mild scarring in the left mid lung.  There is cardiomegaly with pulmonary venous hypertension. No adenopathy. There is atherosclerotic change in the aorta. No bone lesions.  IMPRESSION: Evidence of volume overload with pulmonary venous hypertension and cardiomegaly. No edema or consolidation.   Electronically Signed   By: Lowella Grip M.D.   On: 03/23/2013 14:28      EKG Interpretation   None       MDM  No diagnosis found.   Patient with CHF and SOB.  CXR shows fluid overload.  Anticipate admission.  Patient discussed with [name].  3:58 PM Patient signed out to Eloise Harman, who will continue care.    Plan:  Follow-up on labs, re-examine, and discuss with [name].  Anticipate that [name].    Montine Circle, PA-C 03/23/13 1600

## 2013-03-23 NOTE — H&P (Signed)
[name] is an 63 y.o. male.   Chief Complaint: Chest pain  HPI: Patient presents to the emergency department with chief complaints of chest pain, shortness of breath, and generalized body aches. He states that the symptoms have been ongoing for the past 3 days. He states that he has not taken anything to alleviate her symptoms. He has a history of A. fib, SVT. He has had 2 ablations. He states that his pain is 5/10. It does not radiate. He states that he is short of breath and cooler extremities.  Past Medical History  Diagnosis Date  . Hypertension   . GERD (gastroesophageal reflux disease)   . Hiatal hernia   . CHF (congestive heart failure)   . Cardiomyopathy     nonischemic   . COPD (chronic obstructive pulmonary disease)     previously listed in DC summary from Feb 2012   . Chronic kidney disease (CKD), stage II (mild)     listed in chart although patient denies   . Atrial fibrillation      previously listed in DC summary from August 31, 2011, ? atrial tachycardia   . SVT (supraventricular tachycardia)   . Heart murmur     "dx'd in middle school" (12/22/2012)  . Pneumonia     "once when I was a baby; probably 3-4 times since then" (12/22/2012)  . Shortness of breath     "all the time last couple months" (12/22/2012)  . TWKMQKMM(381.7)     "weekly" (01/01/2013)  . Arthritis     "neck" (12/22/2012)  . Gout   . Depression       Past Surgical History  Procedure Laterality Date  . Tee without cardioversion N/A 05/11/2012    Procedure: TRANSESOPHAGEAL ECHOCARDIOGRAM (TEE);  Surgeon: Birdie Riddle, MD;  Location: [facility] ENDOSCOPY;  Service: Cardiovascular;  Laterality: N/A;  . Cardioversion N/A 05/11/2012    Procedure: Electrocardioversion, external;  Surgeon: Birdie Riddle, MD;  Location: [place];  Service: Cardiovascular;  Laterality: N/A;  . Cardioversion N/A 05/11/2012    Procedure: CARDIOVERSION AT THE BEDSIDE;  Surgeon: Birdie Riddle, MD;  Location: [place];  Service:  Cardiovascular;  Laterality: N/A;  . Supraventricular tachycardia ablation  08/2011; 09/2011  . Cardiac catheterization  08/2011    Family History  Problem Relation Age of Onset  . Lung cancer Mother     passed away from lung cancer  . Pneumonia Father     passed sway from PNA.  . Stroke Sister   . Diabetes Sister   . Seizures Brother   . Seizures Brother    Social History:  reports that he quit smoking about 20 months ago. His smoking use included Cigarettes. He has a 3.75 pack-year smoking history. He has never used smokeless tobacco. He reports that he drinks about 3.6 ounces of alcohol per week. He reports that he uses illicit drugs (Cocaine and Marijuana).  Allergies: No Known Allergies   (Not in a hospital admission)  Results for orders placed during the hospital encounter of 03/23/13 (from the past 48 hour(s))  CBC     Status: None   Collection Time    03/23/13  2:01 PM      Result Value Range   WBC 5.5  4.0 - 10.5 K/uL   RBC 4.33  4.22 - 5.81 MIL/uL   Hemoglobin 14.6  13.0 - 17.0 g/dL   HCT 42.8  39.0 - 52.0 %   MCV 98.8  78.0 - 100.0 fL  MCH 33.7  26.0 - 34.0 pg   MCHC 34.1  30.0 - 36.0 g/dL   RDW 13.6  11.5 - 15.5 %   Platelets 208  150 - 400 K/uL  BASIC METABOLIC PANEL     Status: Abnormal   Collection Time    03/23/13  2:01 PM      Result Value Range   Sodium 141  137 - 147 mEq/L   Potassium 4.6  3.7 - 5.3 mEq/L   Chloride 103  96 - 112 mEq/L   CO2 19  19 - 32 mEq/L   Glucose, Bld 73  70 - 99 mg/dL   BUN 26 (*) 6 - 23 mg/dL   Creatinine, Ser 1.70 (*) 0.50 - 1.35 mg/dL   Calcium 9.8  8.4 - 10.5 mg/dL   GFR calc non Af Amer 41 (*) >90 mL/min   GFR calc Af Amer 48 (*) >90 mL/min   Comment: (NOTE)     The eGFR has been calculated using the CKD EPI equation.     This calculation has not been validated in all clinical situations.     eGFR's persistently <90 mL/min signify possible Chronic Kidney     Disease.  PROTIME-INR     Status: Abnormal   Collection  Time    03/23/13  2:26 PM      Result Value Range   Prothrombin Time 16.8 (*) 11.6 - 15.2 seconds   INR 1.40  0.00 - 1.49  POCT I-STAT TROPONIN I     Status: None   Collection Time    03/23/13  3:49 PM      Result Value Range   Troponin i, poc 0.05  0.00 - 0.08 ng/mL   Comment 3            Comment: Due to the release kinetics of cTnI,     a negative result within the first hours     of the onset of symptoms does not rule out     myocardial infarction with certainty.     If myocardial infarction is still suspected,     repeat the test at appropriate intervals.   Dg Chest 2 View  03/23/2013   CLINICAL DATA:  Shortness of breath and chest pain  EXAM: CHEST  2 VIEW  COMPARISON:  December 22, 2012  FINDINGS: There is no edema or consolidation. No effusions. There is stable mild scarring in the left mid lung.  There is cardiomegaly with pulmonary venous hypertension. No adenopathy. There is atherosclerotic change in the aorta. No bone lesions.  IMPRESSION: Evidence of volume overload with pulmonary venous hypertension and cardiomegaly. No edema or consolidation.   Electronically Signed   By: Lowella Grip M.D.   On: 03/23/2013 14:28    ROS Constitutional: Negative for fever and chills.  HENT: Negative for neck pain and neck stiffness.  Eyes: Negative for pain.  Respiratory: Positive for shortness of breath.  Cardiovascular: Positive for chest pain.  Gastrointestinal: Negative for abdominal pain.  Genitourinary: Negative for dysuria.  Musculoskeletal: Positive for back pain. No leg edema  Skin: Negative for rash.  Neurological: Positive for headaches.  Blood pressure 119/88, pulse 97, temperature 97.4 F (36.3 C), temperature source Axillary, resp. rate 23, SpO2 98.00%.  Gen: Averagely built and nourished. Some distress from breathing  HENT: Head: Normocephalic and atraumatic. Eyes: Brown eyes, Conjunctivae and EOM are normal. Pupils are equal, round, and reactive to light.  Neck:  Normal range of motion. Neck supple. Normal  carotid pulses and full JVD present. Cardiovascular: S1 normal, S2 normal and irregular pulses. II/VI systolic murmur at left sternal border. Pulmonary/Chest: Lungs- basal fine crackles, bilateral to auscultation. No tenderness on right sided ribs palpation  Abdominal: Bowel sounds are normal. Soft and epi-gastric-tenderness. Musculoskeletal: Normal range of motion. No edema. Neurological: He is alert and oriented to person, place, and time.  Skin: Skin is warm, dry and intact. No rash noted. No cyanosis. + clubbing.  Psychiatric: Some anxiety   Assessment/Plan Chest pain r/o MI Atrial flutter with slow ventricular response  Acute on chronic left heart systolic failure  Dilated cardiomyopathy.  S/P SVT ablation x 2   Admit, IV lasix, IV NTG, Oxygen.  Virna Livengood S 03/23/2013, 4:20 PM

## 2013-03-24 LAB — TROPONIN I
Troponin I: <0.3 ng/mL (ref <0.30)
Troponin I: <0.3 ng/mL (ref <0.30)

## 2013-03-24 LAB — BASIC METABOLIC PANEL
BUN: 28 mg/dL — AB (ref 6–23)
CALCIUM: 9.3 mg/dL (ref 8.4–10.5)
CO2: 21 meq/L (ref 19–32)
CREATININE: 1.94 mg/dL — AB (ref 0.50–1.35)
Chloride: 103 mEq/L (ref 96–112)
GFR calc Af Amer: 41 mL/min — ABNORMAL LOW (ref >90)
GFR, EST NON AFRICAN AMERICAN: 35 mL/min — AB (ref >90)
GLUCOSE: 113 mg/dL — AB (ref 70–99)
Potassium: 4.2 mEq/L (ref 3.7–5.3)
Sodium: 140 mEq/L (ref 137–147)

## 2013-03-24 MED ORDER — FUROSEMIDE 10 MG/ML IJ SOLN
40.0000 mg | Freq: Two times a day (BID) | INTRAMUSCULAR | Status: DC
  Administered 2013-03-24: 40 mg via INTRAVENOUS
  Filled 2013-03-24: qty 4

## 2013-03-24 MED ORDER — METOLAZONE 2.5 MG PO TABS
2.5000 mg | ORAL_TABLET | Freq: Once | ORAL | Status: AC
  Administered 2013-03-24: 2.5 mg via ORAL
  Filled 2013-03-24: qty 1

## 2013-03-24 MED ORDER — FUROSEMIDE 10 MG/ML IJ SOLN: 80.0000 mg | Freq: Two times a day (BID) | INTRAMUSCULAR | Status: DC

## 2013-03-24 MED ORDER — DIGOXIN 250 MCG PO TABS
0.2500 mg | ORAL_TABLET | Freq: Every day | ORAL | Status: AC
  Administered 2013-03-24 – 2013-03-27 (×4): 0.25 mg via ORAL
  Filled 2013-03-24 (×4): qty 1

## 2013-03-24 MED ORDER — ALUM & MAG HYDROXIDE-SIMETH 200-200-20 MG/5ML PO SUSP: 30.0000 mL | Freq: Four times a day (QID) | ORAL | Status: DC | PRN

## 2013-03-24 MED ORDER — DOBUTAMINE IN D5W 4-5 MG/ML-% IV SOLN
5.0000 ug/kg/min | INTRAVENOUS | Status: DC
  Administered 2013-03-24: 2.5 ug/kg/min via INTRAVENOUS
  Administered 2013-03-24 – 2013-03-26 (×2): 5 ug/kg/min via INTRAVENOUS
  Filled 2013-03-24 (×2): qty 250

## 2013-03-24 MED ORDER — ALBUTEROL SULFATE (2.5 MG/3ML) 0.083% IN NEBU
2.5000 mg | INHALATION_SOLUTION | Freq: Four times a day (QID) | RESPIRATORY_TRACT | Status: DC
  Administered 2013-03-24 – 2013-03-25 (×6): 2.5 mg via RESPIRATORY_TRACT
  Filled 2013-03-24 (×6): qty 3

## 2013-03-24 NOTE — Progress Notes (Signed)
[name] notified of rhythm change - atrial fib/flutter and HR change to 50-60's and pt's C/O indigestion. EKG obtained and orders received

## 2013-03-24 NOTE — Progress Notes (Signed)
  Echocardiogram 2D Echocardiogram has been performed.  [name], [place] 03/24/2013, 9:18 AM

## 2013-03-24 NOTE — Progress Notes (Signed)
[name] in to see pt. Updated on pt's C/O right sided chest and side discomfort ,EKG results and VS status.

## 2013-03-24 NOTE — Progress Notes (Signed)
Subjective:  Feeling better post morphine and diuresis with dobutamine use. Monitor shows atrial flutter with mostly 3:1 block.  Severe LA and moderate RA dilatation and poor LV systolic function as before. Afebrile.  Objective:  Vital Signs in the last 24 hours: Temp:  [97.2 F (36.2 C)-98.3 F (36.8 C)] 97.8 F (36.6 C) (02/06 1603) Pulse Rate:  [33-100] 95 (02/06 1800) Cardiac Rhythm:  [-] Atrial fibrillation;Atrial flutter (02/06 1433) Resp:  [0-36] 29 (02/06 1800) BP: (85-139)/(58-99) 139/99 mmHg (02/06 1800) SpO2:  [80 %-99 %] 94 % (02/06 1800) Weight:  [67.4 kg (148 lb 9.4 oz)] 67.4 kg (148 lb 9.4 oz) (02/06 0400)  Physical Exam: BP Readings from Last 1 Encounters:  03/24/13 139/99     Wt Readings from Last 1 Encounters:  03/24/13 67.4 kg (148 lb 9.4 oz)    Weight change:   HEENT: [place]/AT, Eyes-Brown, PERL, EOMI, Conjunctiva-Pink, Sclera-Non-icteric Neck: No JVD, No bruit, Trachea midline. Lungs:  Clear, Bilateral. Cardiac:  Regular rhythm, normal S1 and S2, no S3. II/VI systolic murmur. Abdomen:  Soft, non-tender. Extremities:  No edema present. No cyanosis. + clubbing. CNS: AxOx3, Cranial nerves grossly intact, moves all 4 extremities. Right handed. Skin: Warm and dry.   Intake/Output from previous day: 02/05 0701 - 02/06 0700 In: 506.5 [P.O.:360; I.V.:146.5] Out: 600 [Urine:600]    Lab Results: BMET    Component Value Date/Time   NA 140 03/24/2013 0141   K 4.2 03/24/2013 0141   CL 103 03/24/2013 0141   CO2 21 03/24/2013 0141   GLUCOSE 113* 03/24/2013 0141   BUN 28* 03/24/2013 0141   CREATININE 1.94* 03/24/2013 0141   CALCIUM 9.3 03/24/2013 0141   GFRNONAA 35* 03/24/2013 0141   GFRAA 41* 03/24/2013 0141   CBC    Component Value Date/Time   WBC 5.5 03/23/2013 1401   RBC 4.33 03/23/2013 1401   HGB 14.6 03/23/2013 1401   HCT 42.8 03/23/2013 1401   PLT 208 03/23/2013 1401   MCV 98.8 03/23/2013 1401   MCH 33.7 03/23/2013 1401   MCHC 34.1 03/23/2013 1401   RDW 13.6 03/23/2013 1401   LYMPHSABS 1.6 05/10/2012 1522   MONOABS 0.6 05/10/2012 1522   EOSABS 0.3 05/10/2012 1522   BASOSABS 0.1 05/10/2012 1522   CARDIAC ENZYMES Lab Results  Component Value Date   CKTOTAL 65 09/08/2011   CKMB 3.5 09/08/2011   TROPONINI <0.30 03/24/2013    Scheduled Meds: . albuterol  2.5 mg Nebulization Q6H  . atorvastatin  10 mg Oral Q M,W,F-1800  . dabigatran  75 mg Oral BID  . digoxin  0.25 mg Oral Daily  . diltiazem  120 mg Oral Q12H  . pantoprazole  40 mg Oral Daily  . sodium chloride  3 mL Intravenous Q12H   Continuous Infusions: . DOBUTamine 5 mcg/kg/min (03/24/13 1430)  . nitroGLYCERIN 10 mcg/min (03/23/13 1750)   PRN Meds:.sodium chloride, acetaminophen, alum & mag hydroxide-simeth, colchicine, morphine injection, ondansetron (ZOFRAN) IV, oxyCODONE, sodium chloride  Assessment/Plan: Chest pain r/o MI  Atrial flutter with slow ventricular response  Acute on chronic left heart systolic failure  Dilated cardiomyopathy.  S/P SVT ablation x 2   Add Lanoxin to augment cardiac output. Analgesic as needed for right sided probably muscular pain. Mylanta and Protonix for stomach pain.   LOS: 1 day    Dixie Dials  MD  03/24/2013, 6:49 PM

## 2013-03-25 LAB — BASIC METABOLIC PANEL
BUN: 33 mg/dL — ABNORMAL HIGH (ref 6–23)
CO2: 20 mEq/L (ref 19–32)
Calcium: 9.3 mg/dL (ref 8.4–10.5)
Chloride: 101 mEq/L (ref 96–112)
Creatinine, Ser: 1.78 mg/dL — ABNORMAL HIGH (ref 0.50–1.35)
GFR calc non Af Amer: 39 mL/min — ABNORMAL LOW (ref >90)
GFR, EST AFRICAN AMERICAN: 45 mL/min — AB (ref >90)
Glucose, Bld: 84 mg/dL (ref 70–99)
POTASSIUM: 3.4 meq/L — AB (ref 3.7–5.3)
Sodium: 138 mEq/L (ref 137–147)

## 2013-03-25 MED ORDER — CARVEDILOL 3.125 MG PO TABS
3.1250 mg | ORAL_TABLET | Freq: Two times a day (BID) | ORAL | Status: DC
  Administered 2013-03-25 – 2013-03-28 (×7): 3.125 mg via ORAL
  Filled 2013-03-25 (×9): qty 1

## 2013-03-25 MED ORDER — ALBUTEROL SULFATE (2.5 MG/3ML) 0.083% IN NEBU
2.5000 mg | INHALATION_SOLUTION | Freq: Two times a day (BID) | RESPIRATORY_TRACT | Status: DC
  Administered 2013-03-26 – 2013-03-27 (×3): 2.5 mg via RESPIRATORY_TRACT
  Filled 2013-03-25 (×4): qty 3

## 2013-03-25 MED ORDER — POTASSIUM CHLORIDE CRYS ER 20 MEQ PO TBCR
40.0000 meq | EXTENDED_RELEASE_TABLET | Freq: Once | ORAL | Status: AC
  Administered 2013-03-25: 40 meq via ORAL
  Filled 2013-03-25: qty 2

## 2013-03-25 NOTE — Progress Notes (Signed)
Pt converted to A flutter 4:1 conduction with rates 50's. Pt is asymptomatic, BP 99/70. 12 Lead EKG to confirm. Dr Terrence Dupont paged to notify.

## 2013-03-25 NOTE — Progress Notes (Signed)
Subjective:  Patient denies any chest pains states breathing has improved denies any palpitations. Tolerating IV dobutamine  Objective:  Vital Signs in the last 24 hours: Temp:  [97.4 F (36.3 C)-98.4 F (36.9 C)] 98.4 F (36.9 C) (02/07 0700) Pulse Rate:  [27-108] 108 (02/07 0700) Resp:  [0-33] 21 (02/07 0800) BP: (85-139)/(46-102) 119/85 mmHg (02/07 0800) SpO2:  [91 %-100 %] 96 % (02/07 0810) Weight:  [66 kg (145 lb 8.1 oz)] 66 kg (145 lb 8.1 oz) (02/07 0500)  Intake/Output from previous day: 02/06 0701 - 02/07 0700 In: 33 [P.O.:480; I.V.:340] Out: 1400 [Urine:1400] Intake/Output from this shift: Total I/O In: 45.3 [I.V.:45.3] Out: -   Physical Exam: Neck: no adenopathy, no carotid bruit, no JVD and supple, symmetrical, trachea midline Lungs: Decreased breath sound at bases with faint rales Heart: regularly irregular rhythm, S1, S2 normal and 2/6 systolic murmur and  soft S3 gallop noted Abdomen: soft, non-tender; bowel sounds normal; no masses,  no organomegaly Extremities: extremities normal, atraumatic, no cyanosis or edema  Lab Results:  Recent Labs  03/23/13 1401  WBC 5.5  HGB 14.6  PLT 208    Recent Labs  03/24/13 0141 03/25/13 0302  NA 140 138  K 4.2 3.4*  CL 103 101  CO2 21 20  GLUCOSE 113* 84  BUN 28* 33*  CREATININE 1.94* 1.78*    Recent Labs  03/24/13 0146 03/24/13 0616  TROPONINI <0.30 <0.30   Hepatic Function Panel No results found for this basename: PROT, ALBUMIN, AST, ALT, ALKPHOS, BILITOT, BILIDIR, IBILI,  in the last 72 hours No results found for this basename: CHOL,  in the last 72 hours No results found for this basename: PROTIME,  in the last 72 hours  Imaging: Imaging results have been reviewed and Dg Chest 2 View  03/23/2013   CLINICAL DATA:  Shortness of breath and chest pain  EXAM: CHEST  2 VIEW  COMPARISON:  December 22, 2012  FINDINGS: There is no edema or consolidation. No effusions. There is stable mild scarring in the  left mid lung.  There is cardiomegaly with pulmonary venous hypertension. No adenopathy. There is atherosclerotic change in the aorta. No bone lesions.  IMPRESSION: Evidence of volume overload with pulmonary venous hypertension and cardiomegaly. No edema or consolidation.   Electronically Signed   By: Lowella Grip M.D.   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  LOS: 2 days    Wadie Liew N 03/25/2013, 10:03 AM

## 2013-03-26 LAB — PRO B NATRIURETIC PEPTIDE: PRO B NATRI PEPTIDE: 2538 pg/mL — AB (ref 0–125)

## 2013-03-26 LAB — BASIC METABOLIC PANEL
BUN: 30 mg/dL — ABNORMAL HIGH (ref 6–23)
CHLORIDE: 100 meq/L (ref 96–112)
CO2: 21 mEq/L (ref 19–32)
Calcium: 9.5 mg/dL (ref 8.4–10.5)
Creatinine, Ser: 1.58 mg/dL — ABNORMAL HIGH (ref 0.50–1.35)
GFR calc non Af Amer: 45 mL/min — ABNORMAL LOW (ref >90)
GFR, EST AFRICAN AMERICAN: 52 mL/min — AB (ref >90)
Glucose, Bld: 93 mg/dL (ref 70–99)
Potassium: 4.2 mEq/L (ref 3.7–5.3)
SODIUM: 138 meq/L (ref 137–147)

## 2013-03-26 MED ORDER — LISINOPRIL 5 MG PO TABS
5.0000 mg | ORAL_TABLET | Freq: Every day | ORAL | Status: DC
  Administered 2013-03-26 – 2013-03-29 (×4): 5 mg via ORAL
  Filled 2013-03-26 (×4): qty 1

## 2013-03-26 NOTE — Progress Notes (Signed)
Subjective:  Patient denies any chest pain states breathing has improved   Objective:  Vital Signs in the last 24 hours: Temp:  [97.1 F (36.2 C)-98.5 F (36.9 C)] 97.3 F (36.3 C) (02/08 0830) Pulse Rate:  [50-103] 77 (02/08 0800) Resp:  [13-24] 19 (02/08 1000) BP: (102-147)/(72-111) 147/89 mmHg (02/08 1000) SpO2:  [89 %-100 %] 98 % (02/08 0800) Weight:  [66.1 kg (145 lb 11.6 oz)] 66.1 kg (145 lb 11.6 oz) (02/08 0500)  Intake/Output from previous day: 02/07 0701 - 02/08 0700 In: 2152.5 [P.O.:1760; I.V.:392.5] Out: 1600 [Urine:1600] Intake/Output from this shift: Total I/O In: 148 [P.O.:100; I.V.:48] Out: 300 [Urine:300]  Physical Exam: Neck: no adenopathy, no carotid bruit, no JVD and supple, symmetrical, trachea midline Lungs: Decreased breath sound at bases with faint rales Heart: regularly irregular rhythm, S1, S2 normal and 2/6 systolic murmur and soft S3 gallop noted Abdomen: soft, non-tender; bowel sounds normal; no masses,  no organomegaly Extremities: extremities normal, atraumatic, no cyanosis or edema  Lab Results:  Recent Labs  03/23/13 1401  WBC 5.5  HGB 14.6  PLT 208    Recent Labs  03/25/13 0302 03/26/13 0308  NA 138 138  K 3.4* 4.2  CL 101 100  CO2 20 21  GLUCOSE 84 93  BUN 33* 30*  CREATININE 1.78* 1.58*    Recent Labs  03/24/13 0146 03/24/13 0616  TROPONINI <0.30 <0.30   Hepatic Function Panel No results found for this basename: PROT, ALBUMIN, AST, ALT, ALKPHOS, BILITOT, BILIDIR, IBILI,  in the last 72 hours No results found for this basename: CHOL,  in the last 72 hours No results found for this basename: PROTIME,  in the last 72 hours  Imaging: Imaging results have been reviewed and No results found.  Cardiac Studies:  Assessment/Plan:  Status post chest pain MI ruled out  Resolving acute on chronic systolic heart failure  Valvular heart disease  Nonischemic dilated cardio myopathy  Status post SVT ablation x2 in the past   Atrial flutter with moderate ventricular response  Chronic kidney disease stage IV improved Plan Add low-dose ACE inhibitors Monitor renal function  LOS: 3 days    [name] N 03/26/2013, 10:50 AM

## 2013-03-26 NOTE — Discharge Instructions (Signed)
Information on my medicine - Pradaxa (dabigatran)  This medication education was reviewed with me or my healthcare representative as part of my discharge preparation.  The pharmacist that spoke with me during my hospital stay was:  Burna Cash [facility]  Why was Pradaxa prescribed for you? Pradaxa was prescribed for you to reduce the risk of forming blood clots that cause a stroke if you have a medical condition called atrial fibrillation (a type of irregular heartbeat).    What do you Need to know about PradAXa? Take your Pradaxa TWICE DAILY - one capsule in the morning and one tablet in the evening with or without food.  It would be best to take the doses about the same time each day.  The capsules should not be broken, chewed or opened - they must be swallowed whole.  Do not store Pradaxa in other medication containers - once the bottle is opened the Pradaxa should be used within FOUR months; throw away any capsules that havent been by that time.  Take Pradaxa exactly as prescribed by your doctor.  DO NOT stop taking Pradaxa without talking to the doctor who prescribed the medication.  Stopping without other stroke prevention medication to take the place of Pradaxa may increase your risk of developing a clot that causes a stroke.  Refill your prescription before you run out.  After discharge, you should have regular check-up appointments with your healthcare provider that is prescribing your Pradaxa.  In the future your dose may need to be changed if your kidney function or weight changes by a significant amount.  What do you do if you miss a dose? If you miss a dose, take it as soon as you remember on the same day.  If your next dose is less than 6 hours away, skip the missed dose.  Do not take two doses of PRADAXA at the same time.  Important Safety Information A possible side effect of Pradaxa is bleeding. You should call your healthcare provider right away if you experience any of  the following:   Bleeding from an injury or your nose that does not stop.   Unusual colored urine (red or dark brown) or unusual colored stools (red or black).   Unusual bruising for unknown reasons.   A serious fall or if you hit your head (even if there is no bleeding).  Some medicines may interact with Pradaxa and might increase your risk of bleeding or clotting while on Pradaxa. To help avoid this, consult your healthcare provider or pharmacist prior to using any new prescription or non-prescription medications, including herbals, vitamins, non-steroidal anti-inflammatory drugs (NSAIDs) and supplements.  This website has more information on Pradaxa (dabigatran): www.[employer].cz.

## 2013-03-27 LAB — BASIC METABOLIC PANEL
BUN: 23 mg/dL (ref 6–23)
CHLORIDE: 100 meq/L (ref 96–112)
CO2: 23 meq/L (ref 19–32)
Calcium: 9.4 mg/dL (ref 8.4–10.5)
Creatinine, Ser: 1.34 mg/dL (ref 0.50–1.35)
GFR calc non Af Amer: 55 mL/min — ABNORMAL LOW (ref >90)
GFR, EST AFRICAN AMERICAN: 64 mL/min — AB (ref >90)
Glucose, Bld: 111 mg/dL — ABNORMAL HIGH (ref 70–99)
Potassium: 4 mEq/L (ref 3.7–5.3)
SODIUM: 136 meq/L — AB (ref 137–147)

## 2013-03-27 LAB — PRO B NATRIURETIC PEPTIDE: PRO B NATRI PEPTIDE: 2736 pg/mL — AB (ref 0–125)

## 2013-03-27 MED ORDER — FUROSEMIDE 10 MG/ML IJ SOLN
40.0000 mg | Freq: Once | INTRAMUSCULAR | Status: AC
  Administered 2013-03-27: 40 mg via INTRAVENOUS
  Filled 2013-03-27: qty 4

## 2013-03-27 MED ORDER — FUROSEMIDE 40 MG PO TABS: 40.0000 mg | ORAL_TABLET | Freq: Two times a day (BID) | ORAL | Status: DC

## 2013-03-27 MED ORDER — ALBUTEROL SULFATE (2.5 MG/3ML) 0.083% IN NEBU: 2.5000 mg | INHALATION_SOLUTION | Freq: Four times a day (QID) | RESPIRATORY_TRACT | Status: DC | PRN

## 2013-03-27 MED ORDER — FUROSEMIDE 40 MG PO TABS
40.0000 mg | ORAL_TABLET | Freq: Every day | ORAL | Status: DC
Start: 2013-03-28 — End: 2013-03-29
  Administered 2013-03-28 – 2013-03-29 (×2): 40 mg via ORAL
  Filled 2013-03-27 (×2): qty 1

## 2013-03-27 NOTE — Progress Notes (Signed)
Subjective:  Some shortness of breath is coming back. Off lasix. Afebrile.  Objective:  Vital Signs in the last 24 hours: Temp:  [97.4 F (36.3 C)-98.1 F (36.7 C)] 97.6 F (36.4 C) (02/09 1140) Pulse Rate:  [70-101] 100 (02/09 1140) Cardiac Rhythm:  [-] Atrial flutter (02/09 1140) Resp:  [13-26] 13 (02/09 1140) BP: (131-145)/(76-107) 145/96 mmHg (02/09 1140) SpO2:  [93 %-97 %] 96 % (02/09 1140) Weight:  [67.3 kg (148 lb 5.9 oz)] 67.3 kg (148 lb 5.9 oz) (02/09 1029)  Physical Exam: BP Readings from Last 1 Encounters:  03/27/13 145/96     Wt Readings from Last 1 Encounters:  03/27/13 67.3 kg (148 lb 5.9 oz)    Weight change:   HEENT: [place]/AT, Eyes-Brown, PERL, EOMI, Conjunctiva-Pink, Sclera-Non-icteric Neck: No JVD, No bruit, Trachea midline. Lungs:  Clear, Bilateral. Cardiac:  Regular rhythm, normal S1 and S2, no S3. II/VI systolic murmur. Abdomen:  Soft, non-tender. Extremities:  No edema present. No cyanosis. No clubbing. CNS: AxOx3, Cranial nerves grossly intact, moves all 4 extremities. Right handed. Skin: Warm and dry.   Intake/Output from previous day: 02/08 0701 - 02/09 0700 In: 1138 [P.O.:790; I.V.:348] Out: 1260 [Urine:1260]    Lab Results: BMET    Component Value Date/Time   NA 136* 03/27/2013 0437   K 4.0 03/27/2013 0437   CL 100 03/27/2013 0437   CO2 23 03/27/2013 0437   GLUCOSE 111* 03/27/2013 0437   BUN 23 03/27/2013 0437   CREATININE 1.34 03/27/2013 0437   CALCIUM 9.4 03/27/2013 0437   GFRNONAA 55* 03/27/2013 0437   GFRAA 64* 03/27/2013 0437   CBC    Component Value Date/Time   WBC 5.5 03/23/2013 1401   RBC 4.33 03/23/2013 1401   HGB 14.6 03/23/2013 1401   HCT 42.8 03/23/2013 1401   PLT 208 03/23/2013 1401   MCV 98.8 03/23/2013 1401   MCH 33.7 03/23/2013 1401   MCHC 34.1 03/23/2013 1401   RDW 13.6 03/23/2013 1401   LYMPHSABS 1.6 05/10/2012 1522   MONOABS 0.6 05/10/2012 1522   EOSABS 0.3 05/10/2012 1522   BASOSABS 0.1 05/10/2012 1522   CARDIAC ENZYMES Lab Results   Component Value Date   CKTOTAL 65 09/08/2011   CKMB 3.5 09/08/2011   TROPONINI <0.30 03/24/2013    Scheduled Meds: . albuterol  2.5 mg Nebulization BID  . atorvastatin  10 mg Oral Q M,W,F-1800  . carvedilol  3.125 mg Oral BID WC  . dabigatran  75 mg Oral BID  . diltiazem  120 mg Oral Q12H  . furosemide  40 mg Intravenous Once  . [START ON 03/28/2013] furosemide  40 mg Oral Daily  . lisinopril  5 mg Oral Daily  . pantoprazole  40 mg Oral Daily  . sodium chloride  3 mL Intravenous Q12H   Continuous Infusions: . nitroGLYCERIN Stopped (03/24/13 2100)   PRN Meds:.sodium chloride, acetaminophen, alum & mag hydroxide-simeth, colchicine, morphine injection, ondansetron (ZOFRAN) IV, oxyCODONE, sodium chloride  Assessment/Plan: Acute on chronic left heart systolic failure Chest pain Dilated cardiomyopathy S/P SVT ablation x 2 Atrial flutter with controlled ventricular response.   LOS: 4 days    Dixie Dials  MD  03/27/2013, 1:58 PM

## 2013-03-28 DIAGNOSIS — I4892 Unspecified atrial flutter: Secondary | ICD-10-CM

## 2013-03-28 DIAGNOSIS — I5023 Acute on chronic systolic (congestive) heart failure: Principal | ICD-10-CM

## 2013-03-28 DIAGNOSIS — I509 Heart failure, unspecified: Secondary | ICD-10-CM

## 2013-03-28 LAB — BASIC METABOLIC PANEL
BUN: 28 mg/dL — AB (ref 6–23)
CHLORIDE: 99 meq/L (ref 96–112)
CO2: 25 mEq/L (ref 19–32)
Calcium: 9.4 mg/dL (ref 8.4–10.5)
Creatinine, Ser: 1.34 mg/dL (ref 0.50–1.35)
GFR calc Af Amer: 64 mL/min — ABNORMAL LOW (ref >90)
GFR calc non Af Amer: 55 mL/min — ABNORMAL LOW (ref >90)
GLUCOSE: 114 mg/dL — AB (ref 70–99)
POTASSIUM: 3.8 meq/L (ref 3.7–5.3)
SODIUM: 140 meq/L (ref 137–147)

## 2013-03-28 LAB — CBC
HEMATOCRIT: 39.7 % (ref 39.0–52.0)
HEMOGLOBIN: 13.6 g/dL (ref 13.0–17.0)
MCH: 33.3 pg (ref 26.0–34.0)
MCHC: 34.3 g/dL (ref 30.0–36.0)
MCV: 97.1 fL (ref 78.0–100.0)
Platelets: 221 10*3/uL (ref 150–400)
RBC: 4.09 MIL/uL — ABNORMAL LOW (ref 4.22–5.81)
RDW: 13.3 % (ref 11.5–15.5)
WBC: 5.6 10*3/uL (ref 4.0–10.5)

## 2013-03-28 MED ORDER — CARVEDILOL 6.25 MG PO TABS
6.2500 mg | ORAL_TABLET | Freq: Two times a day (BID) | ORAL | Status: DC
  Administered 2013-03-29: 6.25 mg via ORAL
  Filled 2013-03-28 (×3): qty 1

## 2013-03-28 MED ORDER — DIGOXIN 125 MCG PO TABS
0.1250 mg | ORAL_TABLET | Freq: Every day | ORAL | Status: DC
  Administered 2013-03-28 – 2013-03-29 (×2): 0.125 mg via ORAL
  Filled 2013-03-28 (×2): qty 1

## 2013-03-28 MED ORDER — POTASSIUM CHLORIDE CRYS ER 10 MEQ PO TBCR
10.0000 meq | EXTENDED_RELEASE_TABLET | Freq: Two times a day (BID) | ORAL | Status: DC
  Administered 2013-03-28 – 2013-03-29 (×2): 10 meq via ORAL
  Filled 2013-03-28 (×3): qty 1

## 2013-03-28 NOTE — Consult Note (Signed)
ELECTROPHYSIOLOGY CONSULT NOTE   Patient ID: JAQUEZ FARRINGTON MRN: [id number], DOB/AGE: 1950-03-25   Admit date: 03/23/2013 Date of Consult: 03/28/2013  Primary Cardiologist: Doylene Canard, MD Reason for Consultation: Atrial flutter  History of Present Illness Williams L Kitko is a 63 y.o. male with a nonischemic cardiomyopathy, EF 30%, severe MR, chronic systolic HF, CKD and COPD. He was hospitalized July 2013 with worsening heart failure and incessant SVT. He underwent electrophysiologic study and catheter ablation of incessant AV node reentrant tachycardia and an atrial tachycardia originating from the right atrium at that time.   Yesterday he presented with chest pain and SOB, ongoing for 3 days. On admission, he was found to have atrial flutter. EP has been asked to see and provide recommendations. He has not had syncope.   Past Medical History Past Medical History  Diagnosis Date  . Hypertension   . GERD (gastroesophageal reflux disease)   . Hiatal hernia   . CHF (congestive heart failure)   . Cardiomyopathy     nonischemic   . COPD (chronic obstructive pulmonary disease)     previously listed in DC summary from Feb 2012   . Chronic kidney disease (CKD), stage II (mild)     listed in chart although patient denies   . Atrial fibrillation      previously listed in DC summary from August 31, 2011, ? atrial tachycardia   . SVT (supraventricular tachycardia)   . Heart murmur     "dx'd in middle school" (12/22/2012)  . Pneumonia     "once when I was a baby; probably 3-4 times since then" (12/22/2012)  . Shortness of breath     "all the time last couple months" (12/22/2012)  . WFUXNATF(573.2)     "weekly" (01/01/2013)  . Arthritis     "neck" (12/22/2012)  . Gout   . Depression     Past Surgical History Past Surgical History  Procedure Laterality Date  . Tee without cardioversion N/A 05/11/2012    Procedure: TRANSESOPHAGEAL ECHOCARDIOGRAM (TEE);  Surgeon: Birdie Riddle, MD;  Location: [facility]  ENDOSCOPY;  Service: Cardiovascular;  Laterality: N/A;  . Cardioversion N/A 05/11/2012    Procedure: Electrocardioversion, external;  Surgeon: Birdie Riddle, MD;  Location: [place];  Service: Cardiovascular;  Laterality: N/A;  . Cardioversion N/A 05/11/2012    Procedure: CARDIOVERSION AT THE BEDSIDE;  Surgeon: Birdie Riddle, MD;  Location: [place];  Service: Cardiovascular;  Laterality: N/A;  . Supraventricular tachycardia ablation  08/2011; 09/2011  . Cardiac catheterization  08/2011    Allergies/Intolerances No Known Allergies  Inpatient Medications . atorvastatin  10 mg Oral Q M,W,F-1800  . carvedilol  3.125 mg Oral BID WC  . dabigatran  75 mg Oral BID  . diltiazem  120 mg Oral Q12H  . furosemide  40 mg Oral Daily  . lisinopril  5 mg Oral Daily  . pantoprazole  40 mg Oral Daily  . sodium chloride  3 mL Intravenous Q12H   . nitroGLYCERIN Stopped (03/24/13 2100)    Family History Family History  Problem Relation Age of Onset  . Lung cancer Mother     passed away from lung cancer  . Pneumonia Father     passed sway from PNA.  . Stroke Sister   . Diabetes Sister   . Seizures Brother   . Seizures Brother      Social History History   Social History  . Marital Status: Single    Spouse Name: N/A  Number of Children: N/A  . Years of Education: N/A   Occupational History  . Not on file.   Social History Main Topics  . Smoking status: Former Smoker -- 0.25 packs/day for 15 years    Types: Cigarettes    Quit date: 07/17/2011  . Smokeless tobacco: Never Used  . Alcohol Use: 3.6 oz/week    6 Cans of beer per week     Comment: 12/22/2012 1, ~ 24oz beer q other day at most"  . Drug Use: Yes    Special: Cocaine, Marijuana     Comment: 12/22/2012 "Cocaine and Marijuana in 1990's and stopped all drug abuse in 2003"  . Sexual Activity: Not Currently   Other Topics Concern  . Not on file   Social History Narrative  . No narrative on file     Review of  Systems General: No chills, fever, night sweats or weight changes  Cardiovascular:  No chest pain, dyspnea on exertion, edema, orthopnea, palpitations, paroxysmal nocturnal dyspnea Dermatological: No rash, lesions or masses Respiratory: No cough, dyspnea Urologic: No hematuria, dysuria Abdominal: No nausea, vomiting, diarrhea, bright red blood per rectum, melena, or hematemesis Neurologic: No visual changes, weakness, changes in mental status All other systems reviewed and are otherwise negative except as noted above.  Physical Exam Vitals: Blood pressure 132/99, pulse 82, temperature 97.9 F (36.6 C), temperature source Oral, resp. rate 21, height 5\' 9"  (1.753 m), weight 144 lb 11.2 oz (65.635 kg), SpO2 90.00%.  General: Well developed, well appearing 63 y.o. male in no acute distress. HEENT: Normocephalic, atraumatic. EOMs intact. Sclera nonicteric. Oropharynx clear.  Neck: Supple without bruits. No JVD. Lungs: Respirations regular and unlabored, CTA bilaterally. No wheezes, rales or rhonchi. Heart: IRRR. S1, S2 present. No murmurs, rub, S3 or S4. Abdomen: Soft, non-tender, non-distended. BS present x 4 quadrants. No hepatosplenomegaly.  Extremities: No clubbing, cyanosis or edema. DP/PT/Radials 2+ and equal bilaterally. Psych: Normal affect. Neuro: Alert and oriented X 3. Moves all extremities spontaneously. Musculoskeletal: No kyphosis. Skin: Intact. Warm and dry. No rashes or petechiae in exposed areas.   Labs Lab Results  Component Value Date   WBC 5.6 03/28/2013   HGB 13.6 03/28/2013   HCT 39.7 03/28/2013   MCV 97.1 03/28/2013   PLT 221 03/28/2013    Recent Labs Lab 03/28/13 0220  NA 140  K 3.8  CL 99  CO2 25  BUN 28*  CREATININE 1.34  CALCIUM 9.4  GLUCOSE 114*  ProBNP 7217 >> 2736 Troponin negative x3  Radiology/Studies Dg Chest 2 View  03/23/2013   CLINICAL DATA:  Shortness of breath and chest pain  EXAM: CHEST  2 VIEW  COMPARISON:  December 22, 2012  FINDINGS:  There is no edema or consolidation. No effusions. There is stable mild scarring in the left mid lung.  There is cardiomegaly with pulmonary venous hypertension. No adenopathy. There is atherosclerotic change in the aorta. No bone lesions.  IMPRESSION: Evidence of volume overload with pulmonary venous hypertension and cardiomegaly. No edema or consolidation.   Electronically Signed   By: Lowella Grip M.D.   On: 03/23/2013 14:28   Echocardiogram 03/24/2013 Study Conclusions - Left ventricle: The cavity size was normal. There was mild concentric hypertrophy. Systolic function was severely reduced. The estimated ejection fraction was in the range of 25% to 30%. There is severe hypokinesis of the entire myocardium. - Mitral valve: Severe regurgitation. - Left atrium: The atrium was severely dilated. - Right atrium: The atrium was mildly dilated. -  Tricuspid valve: Moderate regurgitation. - Pulmonary arteries: Systolic pressure was moderately increased. PA peak pressure: 53mm Hg (S).  12-lead ECG on admission - left atrial flutter Telemetry reviewed - persistent atrial flutter  Assessment and Plan 1. Recurrent atrial tachycardia/flutter 2. Acute on chronic systolic HF 3. NICM, EF 25-30% 4. Severe MR  EP Attending  Patient seen and examined. I have amended the note above and concur with the history, physical exam, assessment and plan as noted above. He has left atrial flutter and worsening LV dysfunction. His ventricular rate is controlled. I would suggest a strategy of rate control, systemic anti-coagulation and advancement of his heart failure meds. He may or may not be a candidate for mitral valve repair. I will defer the decision on the to [name]. Catheter ablation of his left atrial flutter is possible but as he is minimally if at all symptomatic, and the procedural success low, I would not be inclined to pursue ablation. Amiodarone or Tikosyn would likely be the only  anti-arrhythmic medication that would maintain him in NSR and on my visit, he did not appear to be interested in this course of therapy.  Mikle Bosworth.D.

## 2013-03-28 NOTE — Progress Notes (Signed)
Subjective:  Feeling better. Occasional shortness of breath. Afebrile. Some gout pain.  Objective:  Vital Signs in the last 24 hours: Temp:  [97.5 F (36.4 C)-98.5 F (36.9 C)] 97.8 F (36.6 C) (02/10 1645) Pulse Rate:  [82-102] 101 (02/10 0800) Cardiac Rhythm:  [-] Atrial flutter (02/10 0900) Resp:  [18-29] 27 (02/10 1700) BP: (111-146)/(71-99) 122/79 mmHg (02/10 1645) SpO2:  [90 %-98 %] 98 % (02/10 1645) Weight:  [65.635 kg (144 lb 11.2 oz)] 65.635 kg (144 lb 11.2 oz) (02/10 0418)  Physical Exam: BP Readings from Last 1 Encounters:  03/28/13 122/79     Wt Readings from Last 1 Encounters:  03/28/13 65.635 kg (144 lb 11.2 oz)    Weight change:   HEENT: [place]/AT, Eyes-Brown, PERL, EOMI, Conjunctiva-Pink, Sclera-Non-icteric Neck: No JVD, No bruit, Trachea midline. Lungs:  Clear, Bilateral. Cardiac:  Regular rhythm, normal S1 and S2, no S3. II/VI systolic murmur. Abdomen:  Soft, non-tender. Extremities:  No edema present. No cyanosis. No clubbing. CNS: AxOx3, Cranial nerves grossly intact, moves all 4 extremities. Right handed. Skin: Warm and dry.   Intake/Output from previous day: 02/09 0701 - 02/10 0700 In: 845 [P.O.:800; I.V.:45] Out: 2225 [Urine:2225]    Lab Results: BMET    Component Value Date/Time   NA 140 03/28/2013 0220   K 3.8 03/28/2013 0220   CL 99 03/28/2013 0220   CO2 25 03/28/2013 0220   GLUCOSE 114* 03/28/2013 0220   BUN 28* 03/28/2013 0220   CREATININE 1.34 03/28/2013 0220   CALCIUM 9.4 03/28/2013 0220   GFRNONAA 55* 03/28/2013 0220   GFRAA 64* 03/28/2013 0220   CBC    Component Value Date/Time   WBC 5.6 03/28/2013 0220   RBC 4.09* 03/28/2013 0220   HGB 13.6 03/28/2013 0220   HCT 39.7 03/28/2013 0220   PLT 221 03/28/2013 0220   MCV 97.1 03/28/2013 0220   MCH 33.3 03/28/2013 0220   MCHC 34.3 03/28/2013 0220   RDW 13.3 03/28/2013 0220   LYMPHSABS 1.6 05/10/2012 1522   MONOABS 0.6 05/10/2012 1522   EOSABS 0.3 05/10/2012 1522   BASOSABS 0.1 05/10/2012 1522    CARDIAC ENZYMES Lab Results  Component Value Date   CKTOTAL 65 09/08/2011   CKMB 3.5 09/08/2011   TROPONINI <0.30 03/24/2013    Scheduled Meds: . atorvastatin  10 mg Oral Q M,W,F-1800  . [START ON 03/29/2013] carvedilol  6.25 mg Oral BID WC  . dabigatran  75 mg Oral BID  . digoxin  0.125 mg Oral Daily  . diltiazem  120 mg Oral Q12H  . furosemide  40 mg Oral Daily  . lisinopril  5 mg Oral Daily  . pantoprazole  40 mg Oral Daily  . potassium chloride  10 mEq Oral BID  . sodium chloride  3 mL Intravenous Q12H   Continuous Infusions: . nitroGLYCERIN Stopped (03/24/13 2100)   PRN Meds:.sodium chloride, acetaminophen, albuterol, alum & mag hydroxide-simeth, colchicine, morphine injection, ondansetron (ZOFRAN) IV, oxyCODONE, sodium chloride  Assessment/Plan: Acute on chronic left heart systolic failure  Chest pain  Dilated cardiomyopathy  S/P SVT ablation x 2  Atrial flutter with controlled ventricular response  Continue medical treatment. Increase activity as tolerated.   LOS: 5 days    Dixie Dials  MD  03/28/2013, 6:28 PM

## 2013-03-29 DIAGNOSIS — I4892 Unspecified atrial flutter: Secondary | ICD-10-CM | POA: Diagnosis present

## 2013-03-29 MED ORDER — LISINOPRIL 20 MG PO TABS: 10.0000 mg | ORAL_TABLET | Freq: Every day | ORAL | Status: DC

## 2013-03-29 MED ORDER — OXYCODONE HCL 5 MG PO TABS: 5.0000 mg | ORAL_TABLET | Freq: Two times a day (BID) | ORAL | Status: DC

## 2013-03-29 MED ORDER — POTASSIUM CHLORIDE CRYS ER 10 MEQ PO TBCR: 10.0000 meq | EXTENDED_RELEASE_TABLET | Freq: Two times a day (BID) | ORAL | Status: DC

## 2013-03-29 MED ORDER — DIGOXIN 125 MCG PO TABS: 0.1250 mg | ORAL_TABLET | Freq: Every day | ORAL | Status: DC

## 2013-03-29 NOTE — Progress Notes (Signed)
   CARE MANAGEMENT NOTE 03/29/2013  Patient:  [name], [name]   Account Number:  [id number]  Date Initiated:  03/24/2013  Documentation initiated by:  Elissa Hefty  Subjective/Objective Assessment:   adm w heart failure     Action/Plan:   lives w fam, pcp dr Mat Carne   Anticipated DC Date:  03/29/2013   Anticipated DC Plan:  [place]  CM consult      Choice offered to / List presented to:             Status of service:  Completed, signed off Medicare Important Message given?   (If response is "NO", the following Medicare IM given date fields will be blank) Date Medicare IM given:   Date Additional Medicare IM given:    Discharge Disposition:  HOME/SELF CARE  Per UR Regulation:  Reviewed for med. necessity/level of care/duration of stay  If discussed at [place] of Stay Meetings, dates discussed:   03/28/2013    Comments:  03/29/2013 1029 No NCM needs identified. Jonnie Finner RN CCM Jonnie Finner RN CCM Case Mgmt phone [phone]  03/28/2013 1130 NCM spoke to pt and lives at home with wife, [name]. Waiting final dc recommendations. Jonnie Finner RN CCM Case Mgmt phone [phone]  2/6 [phone] debbie dowell rn,bsn will moniter for dc planning needs as pt progresses.

## 2013-03-29 NOTE — Progress Notes (Signed)
D/c instructions given and explained to patient and wife.  Pt asked appropriate questions about changes to medications.  He felt confident he understood after explanation.  Pt d/c with wife, to car in [place].    Carol Ada, RN

## 2013-03-29 NOTE — Discharge Summary (Signed)
Physician Discharge Summary  Patient ID: [name] MRN: [id number] DOB/AGE: 07-18-1950 63 y.o.  Admit date: 03/23/2013 Discharge date: 03/29/2013  Admission Diagnoses: Acute on chronic left heart systolic failure  Chest pain  Dilated cardiomyopathy  S/P SVT ablation x 2  Atrial flutter with controlled ventricular response  Discharge Diagnoses:  Principle Problem: * Acute on chronic left systolic heart failure * Chest pain  Dilated cardiomyopathy  S/P SVT ablation x 2  Atrial flutter with controlled ventricular response Gout Mild to moderate pulmonary hypertension  Discharged Condition: fair  Hospital Course: 63 year old patient with dilated cardiomyopathy presents to the emergency department with chief complaints of chest pain, shortness of breath, and generalized body aches. He has a history of A. fib, SVT. He has had 2 ablations. He states that his pain was 5/10. It does not radiate. He was short of breath and had cooler extremities. He was in atrial flutter with controlled ventricular response. He had limited diuresis until IV dopamine was used for 72 hours. His renal function improved. He was back on ace inhibitor and Beta blocker. His dilated non-ischemic cardiomyopathy with low EF of 20-25 % remains unchanged. He had some benefit from digoxin use. He was back in Sinus rhythm. EP consult of Dr. Lovena Le will be finished on OP basis.   Consults: cardiology  Significant Diagnostic Studies: labs: Normal CBC and BMET except creatinine of 1.34 to 1.70. Pro-BNP from 7217 on admission came down to 2 K range.  EKG-Atrial flutter with 4:1 conduction, now Sinus rhythm.  Echocardiogram: Left ventricle: The cavity size was normal. There was mild concentric hypertrophy. Systolic function was severely reduced. The estimated ejection fraction was in the range of 25% to 30%. There is severe hypokinesis of the entire myocardium. - Mitral valve: Severe regurgitation. - Left atrium: The atrium  was severely dilated. - Right atrium: The atrium was mildly dilated. - Tricuspid valve: Moderate regurgitation. - Pulmonary arteries: Systolic pressure was moderately increased. PA peak pressure: 33mm Hg (S).   Treatments: cardiac meds: lisinopril (Zestril), carvedilol, digoxin and diltiazem. IV dopamine x 3 days.  Discharge Exam: Blood pressure 112/80, pulse 96, temperature 97.8 F (36.6 C), temperature source Oral, resp. rate 20, height 5\' 9"  (1.753 m), weight 65.6 kg (144 lb 10 oz), SpO2 92.00%. HEENT: [place]/AT, Eyes-Brown, PERL, EOMI, Conjunctiva-Pink, Sclera-Non-icteric  Neck: No JVD, No bruit, Trachea midline.  Lungs: Clear, Bilateral.  Cardiac: Regular rhythm, normal S1 and S2, no S3. II/VI systolic murmur.  Abdomen: Soft, non-tender.  Extremities: Right foot tenderness, no significant edema present. No cyanosis. No clubbing.  CNS: AxOx3, Cranial nerves grossly intact, moves all 4 extremities. Right handed.  Skin: Warm and dry.   Disposition: 01-Home or Self Care     Medication List         albuterol 108 (90 BASE) MCG/ACT inhaler  Commonly known as:  PROVENTIL HFA;VENTOLIN HFA  Inhale 2 puffs into the lungs every 6 (six) hours as needed. For shortness of breath     carvedilol 6.25 MG tablet  Commonly known as:  COREG  Take 6.25 mg by mouth 2 (two) times daily with a meal.     colchicine 0.6 MG tablet  Take 0.6 mg by mouth 2 (two) times daily as needed (for gout).     dabigatran 75 MG Caps capsule  Commonly known as:  PRADAXA  Take 75 mg by mouth 2 (two) times daily.     digoxin 0.125 MG tablet  Commonly known as:  LANOXIN  Take 1 tablet (0.125 mg total) by mouth daily.     diltiazem 120 MG tablet  Commonly known as:  CARDIZEM  Take 120 mg by mouth 2 (two) times daily.     furosemide 40 MG tablet  Commonly known as:  LASIX  Take 40 mg by mouth daily.     lisinopril 20 MG tablet  Commonly known as:  PRINIVIL,ZESTRIL  Take 0.5 tablets (10 mg total) by mouth  daily.     multivitamins ther. w/minerals Tabs tablet  Take 0.5 tablets by mouth 2 (two) times daily.     oxyCODONE 5 MG immediate release tablet  Commonly known as:  Oxy IR/ROXICODONE  Take 1 tablet (5 mg total) by mouth 2 (two) times daily.     pantoprazole 40 MG tablet  Commonly known as:  PROTONIX  Take 40 mg by mouth daily.     potassium chloride 10 MEQ tablet  Commonly known as:  K-DUR,KLOR-CON  Take 1 tablet (10 mEq total) by mouth 2 (two) times daily.     rosuvastatin 10 MG tablet  Commonly known as:  CRESTOR  Take 5 mg by mouth 3 (three) times a week. Monday, Wednesday and friday           Follow-up Information   Follow up with [facility], MD. Schedule an appointment as soon as possible for a visit in 1 week.   Specialty:  Cardiology   Contact information:   [place] [address] [phone]       Follow up with Cristopher Peru, MD. Schedule an appointment as soon as possible for a visit in 2 weeks.   Specialty:  Cardiology   Contact information:   [address] [place] [address] [phone]       Signed: Birdie Riddle 03/29/2013, 9:23 AM

## 2013-08-03 ENCOUNTER — Encounter: Payer: Self-pay | Admitting: Internal Medicine

## 2013-08-03 ENCOUNTER — Encounter (INDEPENDENT_AMBULATORY_CARE_PROVIDER_SITE_OTHER): Payer: Self-pay

## 2013-08-03 ENCOUNTER — Ambulatory Visit (INDEPENDENT_AMBULATORY_CARE_PROVIDER_SITE_OTHER): Payer: Commercial Managed Care - HMO | Admitting: Internal Medicine

## 2013-08-03 VITALS — BP 110/72 | HR 49 | Ht 69.0 in | Wt 148.0 lb

## 2013-08-03 DIAGNOSIS — I471 Supraventricular tachycardia: Secondary | ICD-10-CM

## 2013-08-03 DIAGNOSIS — I484 Atypical atrial flutter: Secondary | ICD-10-CM

## 2013-08-03 DIAGNOSIS — I4892 Unspecified atrial flutter: Secondary | ICD-10-CM

## 2013-08-03 DIAGNOSIS — I5022 Chronic systolic (congestive) heart failure: Secondary | ICD-10-CM

## 2013-08-03 DIAGNOSIS — I498 Other specified cardiac arrhythmias: Secondary | ICD-10-CM

## 2013-08-03 NOTE — Patient Instructions (Signed)
Your physician wants you to follow-up in: 12 months with Dr. Taylor. You will receive a reminder letter in the mail two months in advance. If you don't receive a letter, please call our office to schedule the follow-up appointment.    

## 2013-08-03 NOTE — Progress Notes (Signed)
HPI [name] returns today for followup. He is a very pleasant 63 year old man with a nonischemic cardiomyopathy and incessant SVT. He has not been seen in our clinic for over a year. He denies chest pain or sob No syncope. He was found to be in atrial flutter with a controlled VR. He was asymptomatic. He has not had palpitations. No edema. No Known Allergies   Current Outpatient Prescriptions  Medication Sig Dispense Refill  . allopurinol (ZYLOPRIM) 100 MG tablet Take 1 tablet by mouth every morning.      . carvedilol (COREG) 6.25 MG tablet Take 6.25 mg by mouth 2 (two) times daily with a meal.      . colchicine 0.6 MG tablet Take 0.6 mg by mouth at bedtime.       . digoxin (LANOXIN) 0.125 MG tablet Take 1 tablet (0.125 mg total) by mouth daily.  30 tablet  3  . diltiazem (CARDIZEM) 120 MG tablet Take 120 mg by mouth 2 (two) times daily.      . Edoxaban Tosylate (SAVAYSA) 30 MG TABS Take 1 tablet by mouth daily.      . furosemide (LASIX) 40 MG tablet Take 40 mg by mouth daily.        Marland Kitchen lisinopril (PRINIVIL,ZESTRIL) 20 MG tablet Take 0.5 tablets (10 mg total) by mouth daily.      . Multiple Vitamins-Minerals (MULTIVITAMINS THER. W/MINERALS) TABS Take 0.5 tablets by mouth 2 (two) times daily.       Marland Kitchen oxyCODONE (OXY IR/ROXICODONE) 5 MG immediate release tablet Take 1 tablet (5 mg total) by mouth 2 (two) times daily.  60 tablet  0  . pantoprazole (PROTONIX) 40 MG tablet Take 40 mg by mouth daily.      . potassium chloride SA (K-DUR,KLOR-CON) 20 MEQ tablet Take 10 mEq by mouth daily.       . rosuvastatin (CRESTOR) 10 MG tablet Take 5 mg by mouth 3 (three) times a week. Monday, Wednesday and Friday      . dabigatran (PRADAXA) 75 MG CAPS capsule Take 75 mg by mouth 2 (two) times daily.       No current facility-administered medications for this visit.     Past Medical History  Diagnosis Date  . Hypertension   . GERD (gastroesophageal reflux disease)   . Hiatal hernia   . CHF (congestive  heart failure)   . Cardiomyopathy     nonischemic   . COPD (chronic obstructive pulmonary disease)     previously listed in DC summary from Feb 2012   . Chronic kidney disease (CKD), stage II (mild)     listed in chart although patient denies   . Atrial fibrillation      previously listed in DC summary from August 31, 2011, ? atrial tachycardia   . SVT (supraventricular tachycardia)   . Heart murmur     "dx'd in middle school" (12/22/2012)  . Pneumonia     "once when I was a baby; probably 3-4 times since then" (12/22/2012)  . Shortness of breath     "all the time last couple months" (12/22/2012)  . RXVQMGQQ(761.9)     "weekly" (01/01/2013)  . Arthritis     "neck" (12/22/2012)  . Gout   . Depression     ROS:   All systems reviewed and negative except as noted in the HPI.   Past Surgical History  Procedure Laterality Date  . Tee without cardioversion N/A 05/11/2012    Procedure: TRANSESOPHAGEAL ECHOCARDIOGRAM (TEE);  Surgeon: Birdie Riddle, MD;  Location: [facility] ENDOSCOPY;  Service: Cardiovascular;  Laterality: N/A;  . Cardioversion N/A 05/11/2012    Procedure: Electrocardioversion, external;  Surgeon: Birdie Riddle, MD;  Location: [place];  Service: Cardiovascular;  Laterality: N/A;  . Cardioversion N/A 05/11/2012    Procedure: CARDIOVERSION AT THE BEDSIDE;  Surgeon: Birdie Riddle, MD;  Location: [place];  Service: Cardiovascular;  Laterality: N/A;  . Supraventricular tachycardia ablation  08/2011; 09/2011  . Cardiac catheterization  08/2011     Family History  Problem Relation Age of Onset  . Lung cancer Mother     passed away from lung cancer  . Pneumonia Father     passed sway from PNA.  . Stroke Sister   . Diabetes Sister   . Seizures Brother   . Seizures Brother      History   Social History  . Marital Status: Single    Spouse Name: N/A    Number of Children: N/A  . Years of Education: N/A   Occupational History  . Not on file.   Social History Main Topics   . Smoking status: Former Smoker -- 0.25 packs/day for 15 years    Types: Cigarettes    Quit date: 07/17/2011  . Smokeless tobacco: Never Used  . Alcohol Use: 3.6 oz/week    6 Cans of beer per week     Comment: 12/22/2012 1, ~ 24oz beer q other day at most"  . Drug Use: Yes    Special: Cocaine, Marijuana     Comment: 12/22/2012 "Cocaine and Marijuana in 1990's and stopped all drug abuse in 2003"  . Sexual Activity: Not Currently   Other Topics Concern  . Not on file   Social History Narrative  . No narrative on file     BP 110/72  Pulse 49  Ht 5\' 9"  (1.753 m)  Wt 148 lb (67.132 kg)  BMI 21.85 kg/m2  Physical Exam:  Well appearing middle-aged man, NAD HEENT: Unremarkable Neck:  7 cm JVD, no thyromegally Lungs:  Clear with no wheezes, rales, or rhonchi. HEART:  Regular rate rhythm, no murmurs, no rubs, no clicks Abd:  soft, positive bowel sounds, no organomegally, no rebound, no guarding Ext:  2 plus pulses, no edema, no cyanosis, no clubbing Skin:  No rashes no nodules Neuro:  CN II through XII intact, motor grossly intact  EKG Atypical atrial flutter with a controlled VR  Assess/Plan:

## 2013-08-03 NOTE — Assessment & Plan Note (Signed)
His symptoms appear to be class 1 and he is on maximal medical therapy. Will follow.

## 2013-08-03 NOTE — Assessment & Plan Note (Signed)
He appears to have left atrial flutter based on the morphology of his flutter waves. I have recommended that he continue anti-coagulation. Would not recommend ablation as procedure with risk and he is asymptomatic.

## 2013-08-22 ENCOUNTER — Encounter (HOSPITAL_COMMUNITY): Payer: Self-pay | Admitting: Emergency Medicine

## 2013-08-22 ENCOUNTER — Emergency Department (HOSPITAL_COMMUNITY): Payer: Medicare HMO

## 2013-08-22 ENCOUNTER — Inpatient Hospital Stay (HOSPITAL_COMMUNITY)
Admission: EM | Admit: 2013-08-22 | Discharge: 2013-08-24 | DRG: 683 | Disposition: A | Discharge status: Home / Self Care | Payer: Medicare HMO | Attending: Internal Medicine | Admitting: Internal Medicine

## 2013-08-22 DIAGNOSIS — Z801 Family history of malignant neoplasm of trachea, bronchus and lung: Secondary | ICD-10-CM

## 2013-08-22 DIAGNOSIS — K449 Diaphragmatic hernia without obstruction or gangrene: Secondary | ICD-10-CM | POA: Diagnosis present

## 2013-08-22 DIAGNOSIS — G540 Brachial plexus disorders: Secondary | ICD-10-CM

## 2013-08-22 DIAGNOSIS — I059 Rheumatic mitral valve disease, unspecified: Secondary | ICD-10-CM | POA: Diagnosis present

## 2013-08-22 DIAGNOSIS — I272 Pulmonary hypertension, unspecified: Secondary | ICD-10-CM

## 2013-08-22 DIAGNOSIS — M109 Gout, unspecified: Secondary | ICD-10-CM | POA: Diagnosis present

## 2013-08-22 DIAGNOSIS — F1010 Alcohol abuse, uncomplicated: Secondary | ICD-10-CM | POA: Diagnosis present

## 2013-08-22 DIAGNOSIS — F1411 Cocaine abuse, in remission: Secondary | ICD-10-CM | POA: Diagnosis present

## 2013-08-22 DIAGNOSIS — I4891 Unspecified atrial fibrillation: Secondary | ICD-10-CM | POA: Diagnosis present

## 2013-08-22 DIAGNOSIS — I5023 Acute on chronic systolic (congestive) heart failure: Secondary | ICD-10-CM

## 2013-08-22 DIAGNOSIS — N179 Acute kidney failure, unspecified: Principal | ICD-10-CM | POA: Diagnosis present

## 2013-08-22 DIAGNOSIS — F329 Major depressive disorder, single episode, unspecified: Secondary | ICD-10-CM | POA: Diagnosis present

## 2013-08-22 DIAGNOSIS — R570 Cardiogenic shock: Secondary | ICD-10-CM

## 2013-08-22 DIAGNOSIS — I4430 Unspecified atrioventricular block: Secondary | ICD-10-CM | POA: Diagnosis present

## 2013-08-22 DIAGNOSIS — N039 Chronic nephritic syndrome with unspecified morphologic changes: Secondary | ICD-10-CM

## 2013-08-22 DIAGNOSIS — N182 Chronic kidney disease, stage 2 (mild): Secondary | ICD-10-CM | POA: Diagnosis present

## 2013-08-22 DIAGNOSIS — Z7901 Long term (current) use of anticoagulants: Secondary | ICD-10-CM

## 2013-08-22 DIAGNOSIS — I459 Conduction disorder, unspecified: Secondary | ICD-10-CM

## 2013-08-22 DIAGNOSIS — Z87891 Personal history of nicotine dependence: Secondary | ICD-10-CM

## 2013-08-22 DIAGNOSIS — I509 Heart failure, unspecified: Secondary | ICD-10-CM | POA: Diagnosis present

## 2013-08-22 DIAGNOSIS — I10 Essential (primary) hypertension: Secondary | ICD-10-CM

## 2013-08-22 DIAGNOSIS — Z833 Family history of diabetes mellitus: Secondary | ICD-10-CM

## 2013-08-22 DIAGNOSIS — I420 Dilated cardiomyopathy: Secondary | ICD-10-CM

## 2013-08-22 DIAGNOSIS — I428 Other cardiomyopathies: Secondary | ICD-10-CM | POA: Diagnosis present

## 2013-08-22 DIAGNOSIS — I484 Atypical atrial flutter: Secondary | ICD-10-CM

## 2013-08-22 DIAGNOSIS — I130 Hypertensive heart and chronic kidney disease with heart failure and stage 1 through stage 4 chronic kidney disease, or unspecified chronic kidney disease: Secondary | ICD-10-CM | POA: Diagnosis present

## 2013-08-22 DIAGNOSIS — K219 Gastro-esophageal reflux disease without esophagitis: Secondary | ICD-10-CM | POA: Diagnosis present

## 2013-08-22 DIAGNOSIS — J4489 Other specified chronic obstructive pulmonary disease: Secondary | ICD-10-CM | POA: Diagnosis present

## 2013-08-22 DIAGNOSIS — R55 Syncope and collapse: Secondary | ICD-10-CM

## 2013-08-22 DIAGNOSIS — T671XXD Heat syncope, subsequent encounter: Secondary | ICD-10-CM

## 2013-08-22 DIAGNOSIS — F3289 Other specified depressive episodes: Secondary | ICD-10-CM | POA: Diagnosis present

## 2013-08-22 DIAGNOSIS — Z823 Family history of stroke: Secondary | ICD-10-CM

## 2013-08-22 DIAGNOSIS — E860 Dehydration: Secondary | ICD-10-CM | POA: Diagnosis present

## 2013-08-22 DIAGNOSIS — J449 Chronic obstructive pulmonary disease, unspecified: Secondary | ICD-10-CM | POA: Diagnosis present

## 2013-08-22 DIAGNOSIS — F1211 Cannabis abuse, in remission: Secondary | ICD-10-CM | POA: Diagnosis present

## 2013-08-22 DIAGNOSIS — I471 Supraventricular tachycardia: Secondary | ICD-10-CM

## 2013-08-22 DIAGNOSIS — I5022 Chronic systolic (congestive) heart failure: Secondary | ICD-10-CM

## 2013-08-22 DIAGNOSIS — M129 Arthropathy, unspecified: Secondary | ICD-10-CM | POA: Diagnosis present

## 2013-08-22 LAB — COMPREHENSIVE METABOLIC PANEL
ALT: 20 U/L (ref 0–53)
AST: 33 U/L (ref 0–37)
Albumin: 3.5 g/dL (ref 3.5–5.2)
Alkaline Phosphatase: 92 U/L (ref 39–117)
Anion gap: 18 — ABNORMAL HIGH (ref 5–15)
BUN: 24 mg/dL — ABNORMAL HIGH (ref 6–23)
CALCIUM: 9.2 mg/dL (ref 8.4–10.5)
CO2: 19 mEq/L (ref 19–32)
Chloride: 104 mEq/L (ref 96–112)
Creatinine, Ser: 2.25 mg/dL — ABNORMAL HIGH (ref 0.50–1.35)
GFR calc Af Amer: 34 mL/min — ABNORMAL LOW (ref >90)
GFR calc non Af Amer: 30 mL/min — ABNORMAL LOW (ref >90)
Glucose, Bld: 108 mg/dL — ABNORMAL HIGH (ref 70–99)
Potassium: 3.8 mEq/L (ref 3.7–5.3)
SODIUM: 141 meq/L (ref 137–147)
TOTAL PROTEIN: 6.7 g/dL (ref 6.0–8.3)
Total Bilirubin: 0.7 mg/dL (ref 0.3–1.2)

## 2013-08-22 LAB — URINALYSIS, ROUTINE W REFLEX MICROSCOPIC
Glucose, UA: NEGATIVE mg/dL
Hgb urine dipstick: NEGATIVE
Ketones, ur: NEGATIVE mg/dL
Leukocytes, UA: NEGATIVE
Nitrite: NEGATIVE
PROTEIN: NEGATIVE mg/dL
SPECIFIC GRAVITY, URINE: 1.018 (ref 1.005–1.030)
UROBILINOGEN UA: 1 mg/dL (ref 0.0–1.0)
pH: 5.5 (ref 5.0–8.0)

## 2013-08-22 LAB — CBC
HCT: 37.8 % — ABNORMAL LOW (ref 39.0–52.0)
Hemoglobin: 12.6 g/dL — ABNORMAL LOW (ref 13.0–17.0)
MCH: 33.4 pg (ref 26.0–34.0)
MCHC: 33.3 g/dL (ref 30.0–36.0)
MCV: 100.3 fL — ABNORMAL HIGH (ref 78.0–100.0)
PLATELETS: 164 10*3/uL (ref 150–400)
RBC: 3.77 MIL/uL — AB (ref 4.22–5.81)
RDW: 12.6 % (ref 11.5–15.5)
WBC: 6 10*3/uL (ref 4.0–10.5)

## 2013-08-22 LAB — PRO B NATRIURETIC PEPTIDE: PRO B NATRI PEPTIDE: 1416 pg/mL — AB (ref 0–125)

## 2013-08-22 LAB — I-STAT TROPONIN, ED: Troponin i, poc: 0.06 ng/mL (ref 0.00–0.08)

## 2013-08-22 LAB — ETHANOL: ALCOHOL ETHYL (B): 48 mg/dL — AB (ref 0–11)

## 2013-08-22 LAB — DIGOXIN LEVEL: DIGOXIN LVL: 1.3 ng/mL (ref 0.8–2.0)

## 2013-08-22 MED ORDER — LISINOPRIL 10 MG PO TABS
10.0000 mg | ORAL_TABLET | Freq: Every day | ORAL | Status: DC
  Administered 2013-08-23 – 2013-08-24 (×2): 10 mg via ORAL
  Filled 2013-08-22 (×2): qty 1

## 2013-08-22 MED ORDER — CARVEDILOL 6.25 MG PO TABS
6.2500 mg | ORAL_TABLET | Freq: Two times a day (BID) | ORAL | Status: DC
  Administered 2013-08-23 – 2013-08-24 (×3): 6.25 mg via ORAL
  Filled 2013-08-22 (×5): qty 1

## 2013-08-22 MED ORDER — EDOXABAN TOSYLATE 30 MG PO TABS
30.0000 mg | ORAL_TABLET | Freq: Every day | ORAL | Status: DC
  Administered 2013-08-23 – 2013-08-24 (×2): 30 mg via ORAL
  Filled 2013-08-22 (×3): qty 1

## 2013-08-22 MED ORDER — DILTIAZEM HCL 60 MG PO TABS
120.0000 mg | ORAL_TABLET | Freq: Two times a day (BID) | ORAL | Status: DC
  Administered 2013-08-23 (×2): 120 mg via ORAL
  Filled 2013-08-22 (×3): qty 2

## 2013-08-22 MED ORDER — THIAMINE HCL 100 MG/ML IJ SOLN
Freq: Once | INTRAVENOUS | Status: DC
  Filled 2013-08-22: qty 1000

## 2013-08-22 MED ORDER — ALLOPURINOL 100 MG PO TABS
100.0000 mg | ORAL_TABLET | Freq: Every day | ORAL | Status: DC
  Administered 2013-08-23 – 2013-08-24 (×2): 100 mg via ORAL
  Filled 2013-08-22 (×2): qty 1

## 2013-08-22 MED ORDER — FUROSEMIDE 20 MG PO TABS: 40.0000 mg | ORAL_TABLET | Freq: Every day | ORAL | Status: DC

## 2013-08-22 MED ORDER — SODIUM CHLORIDE 0.9 % IV SOLN
INTRAVENOUS | Status: AC
  Administered 2013-08-23 (×2): via INTRAVENOUS

## 2013-08-22 MED ORDER — ADULT MULTIVITAMIN W/MINERALS CH
1.0000 | ORAL_TABLET | Freq: Every day | ORAL | Status: DC
  Administered 2013-08-23 – 2013-08-24 (×2): 1 via ORAL
  Filled 2013-08-22 (×2): qty 1

## 2013-08-22 MED ORDER — POTASSIUM CHLORIDE CRYS ER 10 MEQ PO TBCR
10.0000 meq | EXTENDED_RELEASE_TABLET | Freq: Every day | ORAL | Status: DC
  Administered 2013-08-23 – 2013-08-24 (×2): 10 meq via ORAL
  Filled 2013-08-22 (×2): qty 1

## 2013-08-22 MED ORDER — COLCHICINE 0.6 MG PO TABS
0.6000 mg | ORAL_TABLET | Freq: Every day | ORAL | Status: DC
  Administered 2013-08-23 (×2): 0.6 mg via ORAL
  Filled 2013-08-22 (×3): qty 1

## 2013-08-22 MED ORDER — HEPARIN SODIUM (PORCINE) 5000 UNIT/ML IJ SOLN: 5000.0000 [IU] | Freq: Three times a day (TID) | INTRAMUSCULAR | Status: DC

## 2013-08-22 MED ORDER — PANTOPRAZOLE SODIUM 40 MG PO TBEC: 40.0000 mg | DELAYED_RELEASE_TABLET | Freq: Every day | ORAL | Status: DC | PRN

## 2013-08-22 MED ORDER — ATORVASTATIN CALCIUM 20 MG PO TABS
20.0000 mg | ORAL_TABLET | Freq: Every day | ORAL | Status: DC
  Administered 2013-08-23: 20 mg via ORAL
  Filled 2013-08-22 (×2): qty 1

## 2013-08-22 MED ORDER — DIGOXIN 125 MCG PO TABS
0.1250 mg | ORAL_TABLET | Freq: Every day | ORAL | Status: DC
  Administered 2013-08-23 – 2013-08-24 (×2): 0.125 mg via ORAL
  Filled 2013-08-22 (×2): qty 1

## 2013-08-22 MED ORDER — SODIUM CHLORIDE 0.9 % IJ SOLN
3.0000 mL | Freq: Two times a day (BID) | INTRAMUSCULAR | Status: DC
  Administered 2013-08-23 (×2): 3 mL via INTRAVENOUS
  Filled 2013-08-22: qty 3

## 2013-08-22 MED ORDER — OXYCODONE HCL 5 MG PO TABS
5.0000 mg | ORAL_TABLET | Freq: Two times a day (BID) | ORAL | Status: DC | PRN
  Administered 2013-08-23 – 2013-08-24 (×3): 5 mg via ORAL
  Filled 2013-08-22 (×3): qty 1

## 2013-08-22 NOTE — ED Provider Notes (Signed)
CSN: [id number]     Arrival date & time 08/22/13  2054 History   First MD Initiated Contact with Patient 08/22/13 2120     Chief Complaint  Patient presents with  . Loss of Consciousness     (Consider location/radiation/quality/duration/timing/severity/associated sxs/prior Treatment) Patient is a 63 y.o. male presenting with syncope.  Loss of Consciousness Episode history:  Single Most recent episode:  Today Progression:  Resolved Chronicity:  New Context: dehydration   Context comment:  Alcohol use Witnessed: yes   Relieved by:  Bed rest Associated symptoms: diaphoresis   Associated symptoms: no anxiety, no chest pain, no fever, no headaches, no nausea, no palpitations, no seizures, no shortness of breath and no vomiting   Risk factors: coronary artery disease     Past Medical History  Diagnosis Date  . Hypertension   . GERD (gastroesophageal reflux disease)   . Hiatal hernia   . CHF (congestive heart failure)   . Cardiomyopathy     nonischemic   . COPD (chronic obstructive pulmonary disease)     previously listed in DC summary from Feb 2012   . Chronic kidney disease (CKD), stage II (mild)     listed in chart although patient denies   . Atrial fibrillation      previously listed in DC summary from August 31, 2011, ? atrial tachycardia   . SVT (supraventricular tachycardia)   . Heart murmur     "dx'd in middle school" (12/22/2012)  . Pneumonia     "once when I was a baby; probably 3-4 times since then" (12/22/2012)  . Shortness of breath     "all the time last couple months" (12/22/2012)  . VOHYWVPX(106.2)     "weekly" (01/01/2013)  . Arthritis     "neck" (12/22/2012)  . Gout   . Depression    Past Surgical History  Procedure Laterality Date  . Tee without cardioversion N/A 05/11/2012    Procedure: TRANSESOPHAGEAL ECHOCARDIOGRAM (TEE);  Surgeon: Birdie Riddle, MD;  Location: [facility] ENDOSCOPY;  Service: Cardiovascular;  Laterality: N/A;  . Cardioversion N/A 05/11/2012     Procedure: Electrocardioversion, external;  Surgeon: Birdie Riddle, MD;  Location: [place];  Service: Cardiovascular;  Laterality: N/A;  . Cardioversion N/A 05/11/2012    Procedure: CARDIOVERSION AT THE BEDSIDE;  Surgeon: Birdie Riddle, MD;  Location: [place];  Service: Cardiovascular;  Laterality: N/A;  . Supraventricular tachycardia ablation  08/2011; 09/2011  . Cardiac catheterization  08/2011   Family History  Problem Relation Age of Onset  . Lung cancer Mother     passed away from lung cancer  . Pneumonia Father     passed sway from PNA.  . Stroke Sister   . Diabetes Sister   . Seizures Brother   . Seizures Brother    History  Substance Use Topics  . Smoking status: Former Smoker -- 0.25 packs/day for 15 years    Types: Cigarettes    Quit date: 07/17/2011  . Smokeless tobacco: Never Used  . Alcohol Use: 3.6 oz/week    6 Cans of beer per week     Comment: 12/22/2012 1, ~ 24oz beer q other day at most"    Review of Systems  Constitutional: Positive for diaphoresis. Negative for fever and chills.  HENT: Negative for sore throat.   Eyes: Negative for pain.  Respiratory: Negative for cough and shortness of breath.   Cardiovascular: Positive for syncope. Negative for chest pain and palpitations.  Gastrointestinal: Negative for nausea, vomiting  and abdominal pain.  Genitourinary: Negative for dysuria and flank pain.  Musculoskeletal: Negative for back pain and neck pain.  Skin: Negative for rash.  Neurological: Negative for seizures and headaches.      Allergies  Review of patient's allergies indicates no known allergies.  Home Medications   Prior to Admission medications   Medication Sig Start Date End Date Taking? Authorizing Provider  allopurinol (ZYLOPRIM) 100 MG tablet Take 1 tablet by mouth daily.  07/21/13  Yes Historical Provider, MD  carvedilol (COREG) 6.25 MG tablet Take 6.25 mg by mouth 2 (two) times daily with a meal.   Yes Historical Provider, MD   colchicine 0.6 MG tablet Take 0.6 mg by mouth at bedtime.    Yes Historical Provider, MD  digoxin (LANOXIN) 0.125 MG tablet Take 1 tablet (0.125 mg total) by mouth daily. 03/29/13  Yes Birdie Riddle, MD  diltiazem (CARDIZEM) 120 MG tablet Take 120 mg by mouth 2 (two) times daily.   Yes Historical Provider, MD  Edoxaban Tosylate (SAVAYSA) 30 MG TABS Take 1 tablet by mouth daily.   Yes Historical Provider, MD  furosemide (LASIX) 40 MG tablet Take 40 mg by mouth daily.    Yes Historical Provider, MD  lisinopril (PRINIVIL,ZESTRIL) 20 MG tablet Take 0.5 tablets (10 mg total) by mouth daily. 03/29/13  Yes Birdie Riddle, MD  Multiple Vitamin (MULTIVITAMIN WITH MINERALS) TABS tablet Take 1 tablet by mouth daily.   Yes Historical Provider, MD  oxyCODONE (OXY IR/ROXICODONE) 5 MG immediate release tablet Take 5 mg by mouth 2 (two) times daily as needed for moderate pain or severe pain.   Yes Historical Provider, MD  pantoprazole (PROTONIX) 40 MG tablet Take 40 mg by mouth daily as needed (for heartburn).    Yes Historical Provider, MD  potassium chloride SA (K-DUR,KLOR-CON) 20 MEQ tablet Take 10 mEq by mouth daily.    Yes Historical Provider, MD  rosuvastatin (CRESTOR) 20 MG tablet Take 10 mg by mouth every Monday, Wednesday, and Friday.   Yes Historical Provider, MD   BP 124/83  Pulse 59  Temp(Src) 97.7 F (36.5 C) (Oral)  Resp 16  Ht 5\' 8"  (1.727 m)  Wt 146 lb 8 oz (66.452 kg)  BMI 22.28 kg/m2  SpO2 100% Physical Exam  Constitutional: He is oriented to person, place, and time. He appears well-developed and well-nourished. No distress.  HENT:  Head: Normocephalic and atraumatic.  Eyes: Pupils are equal, round, and reactive to light. Right conjunctiva is injected. Left conjunctiva is injected.  Neck: Normal range of motion.  Cardiovascular: Regular rhythm.  Bradycardia present.   Pulmonary/Chest: Effort normal and breath sounds normal.  Abdominal: Soft. He exhibits no distension. There is no  tenderness.  Musculoskeletal: Normal range of motion.  Neurological: He is alert and oriented to person, place, and time.  Skin: Skin is warm. He is not diaphoretic.    ED Course  Procedures (including critical care time) Labs Review Labs Reviewed  CBC - Abnormal; Notable for the following:    RBC 3.77 (*)    Hemoglobin 12.6 (*)    HCT 37.8 (*)    MCV 100.3 (*)    All other components within normal limits  COMPREHENSIVE METABOLIC PANEL - Abnormal; Notable for the following:    Glucose, Bld 108 (*)    BUN 24 (*)    Creatinine, Ser 2.25 (*)    GFR calc non Af Amer 30 (*)    GFR calc Af Amer 34 (*)  Anion gap 18 (*)    All other components within normal limits  PRO B NATRIURETIC PEPTIDE - Abnormal; Notable for the following:    Pro B Natriuretic peptide (BNP) 1416.0 (*)    All other components within normal limits  URINALYSIS, ROUTINE W REFLEX MICROSCOPIC - Abnormal; Notable for the following:    Color, Urine AMBER (*)    Bilirubin Urine SMALL (*)    All other components within normal limits  ETHANOL - Abnormal; Notable for the following:    Alcohol, Ethyl (B) 48 (*)    All other components within normal limits  DIGOXIN LEVEL  COMPREHENSIVE METABOLIC PANEL  CBC WITH DIFFERENTIAL  TSH  TROPONIN I  TROPONIN I  TROPONIN I  HEMOGLOBIN A1C  AMMONIA  URINE RAPID DRUG SCREEN (HOSP PERFORMED)  Randolm Idol, ED    Imaging Review Dg Chest 2 View  08/22/2013   CLINICAL DATA:  Syncope.  Atrial fibrillation.  Hypertension.  EXAM: CHEST  2 VIEW  COMPARISON:  03/23/2013  FINDINGS: Moderate cardiomegaly and chronic pulmonary venous hypertension are stable. No evidence of acute infiltrate or edema. No evidence pleural effusion. Mild tortuosity of thoracic aorta is unchanged.  IMPRESSION: Stable cardiomegaly and chronic pulmonary venous hypertension. No acute findings.   Electronically Signed   By: [name]   On: 08/22/2013 23:49     EKG Interpretation   Date/Time:   Tuesday August 22 2013 21:05:56 EDT Ventricular Rate:  47 PR Interval:    QRS Duration: 142 QT Interval:  537 QTC Calculation: 475 R Axis:   -64 Text Interpretation:  Atrial fibrillation Nonspecific IVCD with LAD LVH  with secondary repolarization abnormality No significant change since last  tracing Confirmed by Winfred Leeds  MD, [name] [phone]) on 08/22/2013 10:46:48 PM      MDM   Final diagnoses:  Syncope and collapse   63 year old male with a history of hypertension CHF cardiomyopathy COPD CKD and prior SVTs who presents today for a syncopal episode.  On arrival here the patient has GCS of 15. His ABCs are intact. Patient appears in no acute distress and is not complaining of any chest pain nor of any shortness of breath. Per the family member at the bedside this syncopal episode was witnessed and the patient was seen to slump over in the chair and became diaphoretic. Patient had not been complaining of any chest pain prior to this moment. Patient had had a significant amount of alcohol prior to this and was outside in the hot weather.  The patient likely syncopized secondary to significant alcohol use today. However given the patient's significant cardiac history and is high risk, we will admit the patient for continued observation and continued workup. A consult with the hospitalist who agrees to admit the patient for further workup. The admitting physician requests that we also consult cardiology. I spoke with [name] who agrees with the plan to admit the patient and agrees to evaluate the patient in the morning. Patient was transferred to the floor in stable condition. Patient was seen and evaluated by myself and by the attending Dr. Winfred Leeds.    Freddi Che, MD 08/23/13 0120

## 2013-08-22 NOTE — ED Notes (Addendum)
Pt presents to ED via for syncope. Per EMS, pt was found on a chair unresponsive, sweating and incontinent, woke on EMS truck. Pt states he was at cook out and started sweating and dizzy. Per EMS, HR-40, BP-82/56, CBG-106, SpO2-95% on room air. Pt states he had 2 40s beer and a hot dark.

## 2013-08-22 NOTE — H&P (Signed)
Hospitalist Admission History and Physical  Patient name: [name] Medical record number: [id number] Date of birth: [birth year] Age: 63 y.o. Gender: male  Primary Care Provider: [name]  Chief Complaint: syncope, AKI   History of Present Illness:This is a 63 y.o. year old male with significant past medical history of systolic CHF, afib on savaysa,  Stage II CKD, COPD, SVT s/p cardioversion and ablation 2013, ETOH abuse  presenting with syncope. The patient reports that he was out gout earlier today. Was eating a lot of foods as well as drinking at least 2 40 ounce cans of icehouse beer. States that he passed out. Per report, pt was diaphoretic in chair with bladder incontinence. No witnessed shaking/tremor. Pt denies any prodrome prior to onset. Was awoken by EMS. Denies any confusion. Has had some headache since awaking.   On presentation to the ER. Hemodynamically stable. Afebrile. BP 70Y-637C systolic. HR in 40s-50s in setting of cardizem and digoxin use. Trop 0.06, WBC 6.0, Hgb 12.6, Cr 2.25, BUN 24. Dig level 1.3. Pro BNP 1400 (baseline 2-3k). EKG w/ rate controlled afib, worsened TWI inversions in lateral leads. CXR w/ stable cardiomegaly and chronic pulm HTN-no acute findings.   Assessment and Plan: Ashtin L Ocanas is a 63 y.o. year old male presenting with syncope  Syncope: Broad differential diagnosis for symptoms, though much higher concern for neurocardiogenic sources especially given patient's prior cardiac history. 2-D echo. MRI of the brain without contrast. EEG. Ammonia level, UDS, alcohol level pending. Mildly dry on exam. Hydrate patient. Hold diuretic. Cycle cardiac enzymes. Continue anticoagulation. Risk stratification labs. Cardiology consult pending. Telemetry bed.  CHF/Afib: noted EKG changes as above. Cards consult pending. Continue outpt regimen. Hold lasix as pt is clinically dry with AKI.  Dig level WNL. 2D ECHO. Tele bed. Follow up on cards recs.    AKI: Baseline creatinine around 1.3-1.5. 2.25 today. Suspect prerenal etiology given overall presentation. Clinically dry on exam. Check renal ultrasound. Urine sodium urine creatinine. Gently hydrate patient.  Alcohol abuse: The patient self reports heavy alcohol use. Alcohol level ammonia level pending. CIWA protocol. Banana bag x1.  COPD: resp status stable. Continue to follow.  FEN/GI: heart healthy diet. PPI Prophylaxis: savaysa  Disposition: pending further evaluation  Code Status:Full Code    Patient Active Problem List   Diagnosis Date Noted  . Syncope 08/22/2013  . Atrial flutter 03/29/2013  . Acute on chronic left systolic heart failure 58/85/0277  . Chronic systolic heart failure 41/28/7867  . SVT (supraventricular tachycardia) 09/16/2011  . Pulmonary hypertension 09/11/2011  . 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 Cardiogenic shock 09/10/2011  . Cardiorenal syndrome 09/10/2011  . 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 Acute on chronic systolic heart failure 67/20/9470    Class: Acute  . Hypertension, benign 01/31/2011    Class: Chronic  . Dilated cardiomyopathy 01/31/2011    Class: Chronic  . Chronic renal insufficiency, stage II (mild) 01/31/2011    Class: Chronic   Past Medical History: Past Medical History  Diagnosis Date  . Hypertension   . GERD (gastroesophageal reflux disease)   . Hiatal hernia   . CHF (congestive heart failure)   . Cardiomyopathy     nonischemic   . COPD (chronic obstructive pulmonary disease)     previously listed in DC summary from Feb 2012   . Chronic kidney disease (CKD), stage II (mild)     listed in chart although patient denies   . Atrial fibrillation  previously listed in DC summary from August 31, 2011, ? atrial tachycardia   . SVT (supraventricular tachycardia)   . Heart murmur     "dx'd in middle school" (12/22/2012)  . Pneumonia     "once when I was a baby; probably 3-4 times since then"  (12/22/2012)  . Shortness of breath     "all the time last couple months" (12/22/2012)  . FBPZWCHE(527.7)     "weekly" (01/01/2013)  . Arthritis     "neck" (12/22/2012)  . Gout   . Depression     Past Surgical History: Past Surgical History  Procedure Laterality Date  . Tee without cardioversion N/A 05/11/2012    Procedure: TRANSESOPHAGEAL ECHOCARDIOGRAM (TEE);  Surgeon: Birdie Riddle, MD;  Location: [facility] ENDOSCOPY;  Service: Cardiovascular;  Laterality: N/A;  . Cardioversion N/A 05/11/2012    Procedure: Electrocardioversion, external;  Surgeon: Birdie Riddle, MD;  Location: [place];  Service: Cardiovascular;  Laterality: N/A;  . Cardioversion N/A 05/11/2012    Procedure: CARDIOVERSION AT THE BEDSIDE;  Surgeon: Birdie Riddle, MD;  Location: [place];  Service: Cardiovascular;  Laterality: N/A;  . Supraventricular tachycardia ablation  08/2011; 09/2011  . Cardiac catheterization  08/2011    Social History: History   Social History  . Marital Status: Single    Spouse Name: N/A    Number of Children: N/A  . Years of Education: N/A   Social History Main Topics  . Smoking status: Former Smoker -- 0.25 packs/day for 15 years    Types: Cigarettes    Quit date: 07/17/2011  . Smokeless tobacco: Never Used  . Alcohol Use: 3.6 oz/week    6 Cans of beer per week     Comment: 12/22/2012 1, ~ 24oz beer q other day at most"  . Drug Use: Yes    Special: Cocaine, Marijuana     Comment: 12/22/2012 "Cocaine and Marijuana in 1990's and stopped all drug abuse in 2003"  . Sexual Activity: Not Currently   Other Topics Concern  . None   Social History Narrative  . None    Family History: Family History  Problem Relation Age of Onset  . Lung cancer Mother     passed away from lung cancer  . Pneumonia Father     passed sway from PNA.  . Stroke Sister   . Diabetes Sister   . Seizures Brother   . Seizures Brother     Allergies: No Known Allergies  Current Facility-Administered  Medications  Medication Dose Route Frequency Provider Last Rate Last Dose  . 0.9 %  sodium chloride infusion   Intravenous Continuous Shanda Howells, MD      . heparin injection 5,000 Units  5,000 Units Subcutaneous 3 times per day Shanda Howells, MD      . sodium chloride 0.9 % 1,000 mL with thiamine 824 mg, folic acid 1 mg, multivitamins adult 10 mL infusion   Intravenous Once Shanda Howells, MD      . sodium chloride 0.9 % injection 3 mL  3 mL Intravenous Q12H Shanda Howells, MD       Current Outpatient Prescriptions  Medication Sig Dispense Refill  . allopurinol (ZYLOPRIM) 100 MG tablet Take 1 tablet by mouth daily.       . carvedilol (COREG) 6.25 MG tablet Take 6.25 mg by mouth 2 (two) times daily with a meal.      . colchicine 0.6 MG tablet Take 0.6 mg by mouth at bedtime.       Marland Kitchen  digoxin (LANOXIN) 0.125 MG tablet Take 1 tablet (0.125 mg total) by mouth daily.  30 tablet  3  . diltiazem (CARDIZEM) 120 MG tablet Take 120 mg by mouth 2 (two) times daily.      . Edoxaban Tosylate (SAVAYSA) 30 MG TABS Take 1 tablet by mouth daily.      . furosemide (LASIX) 40 MG tablet Take 40 mg by mouth daily.       Marland Kitchen lisinopril (PRINIVIL,ZESTRIL) 20 MG tablet Take 0.5 tablets (10 mg total) by mouth daily.      . Multiple Vitamin (MULTIVITAMIN WITH MINERALS) TABS tablet Take 1 tablet by mouth daily.      Marland Kitchen oxyCODONE (OXY IR/ROXICODONE) 5 MG immediate release tablet Take 5 mg by mouth 2 (two) times daily as needed for moderate pain or severe pain.      . pantoprazole (PROTONIX) 40 MG tablet Take 40 mg by mouth daily as needed (for heartburn).       . potassium chloride SA (K-DUR,KLOR-CON) 20 MEQ tablet Take 10 mEq by mouth daily.       . rosuvastatin (CRESTOR) 20 MG tablet Take 10 mg by mouth every Monday, Wednesday, and Friday.       Review Of Systems: 12 point ROS negative except as noted above in HPI.  Physical Exam: Filed Vitals:   08/22/13 2320  BP: 111/64  Pulse: 58  Temp:   Resp: 20     General: cooperative and mildy slowed mentation HEENT: PERRLA and extra ocular movement intact Heart: S1, S2 normal, no murmur, rub or gallop, regular rate and rhythm Lungs: clear to auscultation, no wheezes or rales and unlabored breathing Abdomen: abdomen is soft without significant tenderness, masses, organomegaly or guarding Extremities: extremities normal, atraumatic, no cyanosis or edema Skin:no rashes, no ecchymoses Neurology: normal without focal findings and no asterixis  Labs and Imaging: Lab Results  Component Value Date/Time   NA 141 08/22/2013 10:23 PM   K 3.8 08/22/2013 10:23 PM   CL 104 08/22/2013 10:23 PM   CO2 19 08/22/2013 10:23 PM   BUN 24* 08/22/2013 10:23 PM   CREATININE 2.25* 08/22/2013 10:23 PM   GLUCOSE 108* 08/22/2013 10:23 PM   Lab Results  Component Value Date   WBC 6.0 08/22/2013   HGB 12.6* 08/22/2013   HCT 37.8* 08/22/2013   MCV 100.3* 08/22/2013   PLT 164 08/22/2013    No results found.         Shanda Howells MD  Pager: [phone]

## 2013-08-22 NOTE — ED Provider Notes (Signed)
To have loss of consciousness today after drinking alcohol while at a cookout. He became lightheaded prior to the event. He is presently feels much improved except for mild headache since regaining consciousness. On exam alert Glasgow Coma Score 15 no distress lungs clear auscultation heart bradycardic regular rhythm. In light of patient's nonischemic cardiomyopathy with ejection fraction 26% in 2005 patient likely needs repeat studies. Possible medication adjustment,  Orlie Dakin, MD 08/22/13 [phone]

## 2013-08-23 ENCOUNTER — Observation Stay (HOSPITAL_COMMUNITY): Payer: Medicare HMO

## 2013-08-23 ENCOUNTER — Ambulatory Visit (HOSPITAL_COMMUNITY): Payer: Commercial Managed Care - HMO

## 2013-08-23 DIAGNOSIS — N182 Chronic kidney disease, stage 2 (mild): Secondary | ICD-10-CM

## 2013-08-23 DIAGNOSIS — R55 Syncope and collapse: Secondary | ICD-10-CM

## 2013-08-23 DIAGNOSIS — N179 Acute kidney failure, unspecified: Principal | ICD-10-CM

## 2013-08-23 DIAGNOSIS — I459 Conduction disorder, unspecified: Secondary | ICD-10-CM | POA: Diagnosis present

## 2013-08-23 DIAGNOSIS — I5022 Chronic systolic (congestive) heart failure: Secondary | ICD-10-CM

## 2013-08-23 LAB — TROPONIN I

## 2013-08-23 LAB — CBC WITH DIFFERENTIAL/PLATELET
Basophils Absolute: 0 10*3/uL (ref 0.0–0.1)
Basophils Relative: 0 % (ref 0–1)
Eosinophils Absolute: 0.2 10*3/uL (ref 0.0–0.7)
Eosinophils Relative: 3 % (ref 0–5)
HCT: 39.7 % (ref 39.0–52.0)
HEMOGLOBIN: 13.3 g/dL (ref 13.0–17.0)
LYMPHS PCT: 20 % (ref 12–46)
Lymphs Abs: 1.1 10*3/uL (ref 0.7–4.0)
MCH: 33.9 pg (ref 26.0–34.0)
MCHC: 33.5 g/dL (ref 30.0–36.0)
MCV: 101.3 fL — ABNORMAL HIGH (ref 78.0–100.0)
MONOS PCT: 8 % (ref 3–12)
Monocytes Absolute: 0.5 10*3/uL (ref 0.1–1.0)
NEUTROS ABS: 3.9 10*3/uL (ref 1.7–7.7)
NEUTROS PCT: 69 % (ref 43–77)
Platelets: 138 10*3/uL — ABNORMAL LOW (ref 150–400)
RBC: 3.92 MIL/uL — AB (ref 4.22–5.81)
RDW: 12.7 % (ref 11.5–15.5)
WBC: 5.6 10*3/uL (ref 4.0–10.5)

## 2013-08-23 LAB — HEMOGLOBIN A1C
Hgb A1c MFr Bld: 5.2 % (ref <5.7)
Mean Plasma Glucose: 103 mg/dL (ref <117)

## 2013-08-23 LAB — COMPREHENSIVE METABOLIC PANEL
ALBUMIN: 3.4 g/dL — AB (ref 3.5–5.2)
ALT: 21 U/L (ref 0–53)
ANION GAP: 18 — AB (ref 5–15)
AST: 36 U/L (ref 0–37)
Alkaline Phosphatase: 94 U/L (ref 39–117)
BUN: 22 mg/dL (ref 6–23)
CALCIUM: 9.1 mg/dL (ref 8.4–10.5)
CO2: 18 mEq/L — ABNORMAL LOW (ref 19–32)
CREATININE: 2 mg/dL — AB (ref 0.50–1.35)
Chloride: 104 mEq/L (ref 96–112)
GFR calc Af Amer: 39 mL/min — ABNORMAL LOW (ref >90)
GFR calc non Af Amer: 34 mL/min — ABNORMAL LOW (ref >90)
Glucose, Bld: 78 mg/dL (ref 70–99)
POTASSIUM: 4.2 meq/L (ref 3.7–5.3)
Sodium: 140 mEq/L (ref 137–147)
Total Bilirubin: 0.7 mg/dL (ref 0.3–1.2)
Total Protein: 6.9 g/dL (ref 6.0–8.3)

## 2013-08-23 LAB — TSH: TSH: 1.44 u[IU]/mL (ref 0.350–4.500)

## 2013-08-23 LAB — RAPID URINE DRUG SCREEN, HOSP PERFORMED
AMPHETAMINES: NOT DETECTED
BARBITURATES: NOT DETECTED
Benzodiazepines: NOT DETECTED
Cocaine: POSITIVE — AB
Opiates: NOT DETECTED
Tetrahydrocannabinol: POSITIVE — AB

## 2013-08-23 LAB — AMMONIA: AMMONIA: 51 umol/L (ref 11–60)

## 2013-08-23 MED ORDER — DILTIAZEM HCL 90 MG PO TABS
90.0000 mg | ORAL_TABLET | Freq: Two times a day (BID) | ORAL | Status: DC
  Administered 2013-08-23 – 2013-08-24 (×2): 90 mg via ORAL
  Filled 2013-08-23 (×5): qty 1

## 2013-08-23 NOTE — Progress Notes (Signed)
[facility] PROGRESS NOTE   Assessment/Plan:  AKI (acute kidney injury) - pre-renal due to ACE-I, diuretics and working out in the yard and ETOH use. - start IV fluid check a b-met in am. - hold ACE-I and Lasix.  Syncope - check orthostatics. - d/c telemetry no events. - MRI negative.     Code Status: full Family Communication: none  Disposition Plan: inpatinet   Consultants:  none  Procedures:  MRI  Antibiotics:  none (indicate start date, and stop date if known)  HPI/Subjective: Feels better  Objective: Filed Vitals:   08/22/13 2352 08/23/13 0033 08/23/13 0039 08/23/13 0446  BP:   124/83 120/71  Pulse:   59 74  Temp: 97.7 F (36.5 C)  97.7 F (36.5 C) 98.3 F (36.8 C)  TempSrc:   Oral Oral  Resp:   16 18  Height:  _0  (1.727 m)    Weight:  66.452 kg (146 lb 8 oz)    SpO2:   100% 95%    Intake/Output Summary (Last 24 hours) at 08/23/13 1012 Last data filed at 08/23/13 0653  Gross per 24 hour  Intake  297.5 ml  Output      0 ml  Net  297.5 ml   Filed Weights   08/23/13 0033  Weight: 66.452 kg (146 lb 8 oz)    Exam:  General: Alert, awake, oriented x3, in no acute distress.  HEENT: No bruits, no goiter. -JVD Heart: Regular rate and rhythm, without murmurs, rubs, gallops.  Lungs: Good air movement, clear Abdomen: Soft, nontender, nondistended, positive bowel sounds.  Neuro: Grossly intact, nonfocal.   Data Reviewed: Basic Metabolic Panel:  Recent Labs Lab 08/22/13 2223 08/23/13 0118  NA 141 140  K 3.8 4.2  CL 104 104  CO2 19 18*  GLUCOSE 108* 78  BUN 24* 22  CREATININE 2.25* 2.00*  CALCIUM 9.2 9.1   Liver Function Tests:  Recent Labs Lab 08/22/13 2223 08/23/13 0118  AST 33 36  ALT 20 21  ALKPHOS 92 94  BILITOT 0.7 0.7  PROT 6.7 6.9  ALBUMIN 3.5 3.4*   No results found for this basename: LIPASE, AMYLASE,  in the last 168 hours  Recent Labs Lab 08/23/13 0118  AMMONIA 51   CBC:  Recent Labs Lab  08/22/13 2223 08/23/13 0118  WBC 6.0 5.6  NEUTROABS  --  3.9  HGB 12.6* 13.3  HCT 37.8* 39.7  MCV 100.3* 101.3*  PLT 164 138*   Cardiac Enzymes:  Recent Labs Lab 08/23/13 0118 08/23/13 0540  TROPONINI <0.30 <0.30   BNP (last 3 results)  Recent Labs  03/26/13 0308 03/27/13 0437 08/22/13 2223  PROBNP 2538.0* 2736.0* 1416.0*   CBG: No results found for this basename: GLUCAP,  in the last 168 hours  No results found for this or any previous visit (from the past 240 hour(s)).   Studies: Dg Chest 2 View  08/22/2013   CLINICAL DATA:  Syncope.  Atrial fibrillation.  Hypertension.  EXAM: CHEST  2 VIEW  COMPARISON:  03/23/2013  FINDINGS: Moderate cardiomegaly and chronic pulmonary venous hypertension are stable. No evidence of acute infiltrate or edema. No evidence pleural effusion. Mild tortuosity of thoracic aorta is unchanged.  IMPRESSION: Stable cardiomegaly and chronic pulmonary venous hypertension. No acute findings.   Electronically Signed   By: [name]   On: 08/22/2013 23:49   Mr Brain Wo Contrast  08/23/2013   CLINICAL DATA:  Syncope  EXAM: MRI HEAD WITHOUT CONTRAST  TECHNIQUE: Multiplanar, multiecho pulse sequences of the brain and surrounding structures were obtained without intravenous contrast.  COMPARISON:  None.  FINDINGS: Mild atrophy, typical for age. Patchy hyperintensity throughout the cerebral white matter compatible with moderate chronic microvascular ischemia. Mild chronic microvascular ischemia in the pons.  Negative for acute infarct.  Negative for mass or edema.  Chronic micro hemorrhage left thalamus and left temporoparietal lobe.  Craniocervical junction is normal.  Pituitary is normal in size.  Paranasal sinuses are clear.  IMPRESSION: Chronic microvascular ischemic change. No acute infarct. Mild chronic microhemorrhage compatible with chronic hypertension.   Electronically Signed   By: [name]   On: 08/23/2013 08:43    Scheduled Meds: .  allopurinol  100 mg Oral Daily  . atorvastatin  20 mg Oral q1800  . carvedilol  6.25 mg Oral BID WC  . colchicine  0.6 mg Oral QHS  . digoxin  0.125 mg Oral Daily  . diltiazem  120 mg Oral BID  . edoxaban  30 mg Oral Daily  . lisinopril  10 mg Oral Daily  . multivitamin with minerals  1 tablet Oral Daily  . potassium chloride SA  10 mEq Oral Daily  . banana bag IV 1000 mL   Intravenous Once  . sodium chloride  3 mL Intravenous Q12H   Continuous Infusions: . sodium chloride 50 mL/hr at 08/23/13 0056     Charlynne Cousins  [facility] Pager [phone]. If 8PM-8AM, please contact night-coverage at [email or web address], password [facility] 08/23/2013, 10:12 AM  LOS: 1 day    **Disclaimer: This note may have been dictated with voice recognition software. Similar sounding words can inadvertently be transcribed and this note may contain transcription errors which may not have been corrected upon publication of note.**

## 2013-08-23 NOTE — Progress Notes (Signed)
  Echocardiogram 2D Echocardiogram has been performed.  [name] 08/23/2013, 1:40 PM

## 2013-08-23 NOTE — ED Provider Notes (Signed)
I have personally seen and examined the patient.  I have discussed the plan of care with the resident.  I have reviewed the documentation on PMH/FH/Soc. History.  I have reviewed the documentation of the resident and agree.  Orlie Dakin, MD 08/23/13 [phone]

## 2013-08-23 NOTE — Progress Notes (Signed)
Tried calling to get a report but the ED RN not available at this time.

## 2013-08-23 NOTE — Consult Note (Signed)
Referring Physician: Eugenia Mcalpine Shaler is an 63 y.o. male.                       Chief Complaint: Syncope  HPI: 63 y.o. year old male with significant past medical history of systolic CHF, afib on savaysa, Stage II CKD, COPD, SVT s/p cardioversion and ablation 2013, ETOH abuse presented with syncope. His echocardiogram showed dilated LV with mild LVH, EF 40 % and mild TR, PI and Moderate to severe MR. Heart rate low in 40's and 50's at times.   Past Medical History  Diagnosis Date  . Hypertension   . GERD (gastroesophageal reflux disease)   . Hiatal hernia   . CHF (congestive heart failure)   . Cardiomyopathy     nonischemic   . COPD (chronic obstructive pulmonary disease)     previously listed in DC summary from Feb 2012   . Chronic kidney disease (CKD), stage II (mild)     listed in chart although patient denies   . Atrial fibrillation      previously listed in DC summary from August 31, 2011, ? atrial tachycardia   . SVT (supraventricular tachycardia)   . Heart murmur     "dx'd in middle school" (12/22/2012)  . Pneumonia     "once when I was a baby; probably 3-4 times since then" (12/22/2012)  . Shortness of breath     "all the time last couple months" (12/22/2012)  . GYBWLSLH(734.2)     "weekly" (01/01/2013)  . Arthritis     "neck" (12/22/2012)  . Gout   . Depression       Past Surgical History  Procedure Laterality Date  . Tee without cardioversion N/A 05/11/2012    Procedure: TRANSESOPHAGEAL ECHOCARDIOGRAM (TEE);  Surgeon: Birdie Riddle, MD;  Location: [facility] ENDOSCOPY;  Service: Cardiovascular;  Laterality: N/A;  . Cardioversion N/A 05/11/2012    Procedure: Electrocardioversion, external;  Surgeon: Birdie Riddle, MD;  Location: [place];  Service: Cardiovascular;  Laterality: N/A;  . Cardioversion N/A 05/11/2012    Procedure: CARDIOVERSION AT THE BEDSIDE;  Surgeon: Birdie Riddle, MD;  Location: [place];  Service: Cardiovascular;  Laterality: N/A;  .  Supraventricular tachycardia ablation  08/2011; 09/2011  . Cardiac catheterization  08/2011    Family History  Problem Relation Age of Onset  . Lung cancer Mother     passed away from lung cancer  . Pneumonia Father     passed sway from PNA.  . Stroke Sister   . Diabetes Sister   . Seizures Brother   . Seizures Brother    Social History:  reports that he quit smoking about 2 years ago. His smoking use included Cigarettes. He has a 3.75 pack-year smoking history. He has never used smokeless tobacco. He reports that he drinks about 3.6 ounces of alcohol per week. He reports that he uses illicit drugs (Cocaine and Marijuana).  Allergies: No Known Allergies  Medications Prior to Admission  Medication Sig Dispense Refill  . allopurinol (ZYLOPRIM) 100 MG tablet Take 1 tablet by mouth daily.       . carvedilol (COREG) 6.25 MG tablet Take 6.25 mg by mouth 2 (two) times daily with a meal.      . colchicine 0.6 MG tablet Take 0.6 mg by mouth at bedtime.       . digoxin (LANOXIN) 0.125 MG tablet Take 1 tablet (0.125 mg total) by mouth daily.  30 tablet  3  . diltiazem (CARDIZEM) 120 MG tablet Take 120 mg by mouth 2 (two) times daily.      . Edoxaban Tosylate (SAVAYSA) 30 MG TABS Take 1 tablet by mouth daily.      . furosemide (LASIX) 40 MG tablet Take 40 mg by mouth daily.       Marland Kitchen lisinopril (PRINIVIL,ZESTRIL) 20 MG tablet Take 0.5 tablets (10 mg total) by mouth daily.      . Multiple Vitamin (MULTIVITAMIN WITH MINERALS) TABS tablet Take 1 tablet by mouth daily.      Marland Kitchen oxyCODONE (OXY IR/ROXICODONE) 5 MG immediate release tablet Take 5 mg by mouth 2 (two) times daily as needed for moderate pain or severe pain.      . pantoprazole (PROTONIX) 40 MG tablet Take 40 mg by mouth daily as needed (for heartburn).       . potassium chloride SA (K-DUR,KLOR-CON) 20 MEQ tablet Take 10 mEq by mouth daily.       . rosuvastatin (CRESTOR) 20 MG tablet Take 10 mg by mouth every Monday, Wednesday, and Friday.         Results for orders placed during the hospital encounter of 08/22/13 (from the past 48 hour(s))  CBC     Status: Abnormal   Collection Time    08/22/13 10:23 PM      Result Value Ref Range   WBC 6.0  4.0 - 10.5 K/uL   RBC 3.77 (*) 4.22 - 5.81 MIL/uL   Hemoglobin 12.6 (*) 13.0 - 17.0 g/dL   HCT 37.8 (*) 39.0 - 52.0 %   MCV 100.3 (*) 78.0 - 100.0 fL   MCH 33.4  26.0 - 34.0 pg   MCHC 33.3  30.0 - 36.0 g/dL   RDW 12.6  11.5 - 15.5 %   Platelets 164  150 - 400 K/uL  COMPREHENSIVE METABOLIC PANEL     Status: Abnormal   Collection Time    08/22/13 10:23 PM      Result Value Ref Range   Sodium 141  137 - 147 mEq/L   Potassium 3.8  3.7 - 5.3 mEq/L   Chloride 104  96 - 112 mEq/L   CO2 19  19 - 32 mEq/L   Glucose, Bld 108 (*) 70 - 99 mg/dL   BUN 24 (*) 6 - 23 mg/dL   Creatinine, Ser 2.25 (*) 0.50 - 1.35 mg/dL   Calcium 9.2  8.4 - 10.5 mg/dL   Total Protein 6.7  6.0 - 8.3 g/dL   Albumin 3.5  3.5 - 5.2 g/dL   AST 33  0 - 37 U/L   ALT 20  0 - 53 U/L   Alkaline Phosphatase 92  39 - 117 U/L   Total Bilirubin 0.7  0.3 - 1.2 mg/dL   GFR calc non Af Amer 30 (*) >90 mL/min   GFR calc Af Amer 34 (*) >90 mL/min   Comment: (NOTE)     The eGFR has been calculated using the CKD EPI equation.     This calculation has not been validated in all clinical situations.     eGFR's persistently <90 mL/min signify possible Chronic Kidney     Disease.   Anion gap 18 (*) 5 - 15  PRO B NATRIURETIC PEPTIDE     Status: Abnormal   Collection Time    08/22/13 10:23 PM      Result Value Ref Range   Pro B Natriuretic peptide (BNP) 1416.0 (*) 0 - 125 pg/mL  DIGOXIN LEVEL     Status: None   Collection Time    08/22/13 10:23 PM      Result Value Ref Range   Digoxin Level 1.3  0.8 - 2.0 ng/mL  I-STAT TROPOININ, ED     Status: None   Collection Time    08/22/13 10:27 PM      Result Value Ref Range   Troponin i, poc 0.06  0.00 - 0.08 ng/mL   Comment 3            Comment: Due to the release kinetics of  cTnI,     a negative result within the first hours     of the onset of symptoms does not rule out     myocardial infarction with certainty.     If myocardial infarction is still suspected,     repeat the test at appropriate intervals.  URINALYSIS, ROUTINE W REFLEX MICROSCOPIC     Status: Abnormal   Collection Time    08/22/13 10:28 PM      Result Value Ref Range   Color, Urine AMBER (*) YELLOW   Comment: BIOCHEMICALS MAY BE AFFECTED BY COLOR   APPearance CLEAR  CLEAR   Specific Gravity, Urine 1.018  1.005 - 1.030   pH 5.5  5.0 - 8.0   Glucose, UA NEGATIVE  NEGATIVE mg/dL   Hgb urine dipstick NEGATIVE  NEGATIVE   Bilirubin Urine SMALL (*) NEGATIVE   Ketones, ur NEGATIVE  NEGATIVE mg/dL   Protein, ur NEGATIVE  NEGATIVE mg/dL   Urobilinogen, UA 1.0  0.0 - 1.0 mg/dL   Nitrite NEGATIVE  NEGATIVE   Leukocytes, UA NEGATIVE  NEGATIVE   Comment: MICROSCOPIC NOT DONE ON URINES WITH NEGATIVE PROTEIN, BLOOD, LEUKOCYTES, NITRITE, OR GLUCOSE <1000 mg/dL.  ETHANOL     Status: Abnormal   Collection Time    08/22/13 11:19 PM      Result Value Ref Range   Alcohol, Ethyl (B) 48 (*) 0 - 11 mg/dL   Comment:            LOWEST DETECTABLE LIMIT FOR     SERUM ALCOHOL IS 11 mg/dL     FOR MEDICAL PURPOSES ONLY  COMPREHENSIVE METABOLIC PANEL     Status: Abnormal   Collection Time    08/23/13  1:18 AM      Result Value Ref Range   Sodium 140  137 - 147 mEq/L   Potassium 4.2  3.7 - 5.3 mEq/L   Chloride 104  96 - 112 mEq/L   CO2 18 (*) 19 - 32 mEq/L   Glucose, Bld 78  70 - 99 mg/dL   BUN 22  6 - 23 mg/dL   Creatinine, Ser 2.00 (*) 0.50 - 1.35 mg/dL   Calcium 9.1  8.4 - 10.5 mg/dL   Total Protein 6.9  6.0 - 8.3 g/dL   Albumin 3.4 (*) 3.5 - 5.2 g/dL   AST 36  0 - 37 U/L   Comment: HEMOLYSIS AT THIS LEVEL MAY AFFECT RESULT   ALT 21  0 - 53 U/L   Alkaline Phosphatase 94  39 - 117 U/L   Total Bilirubin 0.7  0.3 - 1.2 mg/dL   GFR calc non Af Amer 34 (*) >90 mL/min   GFR calc Af Amer 39 (*) >90 mL/min    Comment: (NOTE)     The eGFR has been calculated using the CKD EPI equation.     This calculation has not been validated in all  clinical situations.     eGFR's persistently <90 mL/min signify possible Chronic Kidney     Disease.   Anion gap 18 (*) 5 - 15  CBC WITH DIFFERENTIAL     Status: Abnormal   Collection Time    08/23/13  1:18 AM      Result Value Ref Range   WBC 5.6  4.0 - 10.5 K/uL   RBC 3.92 (*) 4.22 - 5.81 MIL/uL   Hemoglobin 13.3  13.0 - 17.0 g/dL   HCT 39.7  39.0 - 52.0 %   MCV 101.3 (*) 78.0 - 100.0 fL   MCH 33.9  26.0 - 34.0 pg   MCHC 33.5  30.0 - 36.0 g/dL   RDW 12.7  11.5 - 15.5 %   Platelets 138 (*) 150 - 400 K/uL   Neutrophils Relative % 69  43 - 77 %   Neutro Abs 3.9  1.7 - 7.7 K/uL   Lymphocytes Relative 20  12 - 46 %   Lymphs Abs 1.1  0.7 - 4.0 K/uL   Monocytes Relative 8  3 - 12 %   Monocytes Absolute 0.5  0.1 - 1.0 K/uL   Eosinophils Relative 3  0 - 5 %   Eosinophils Absolute 0.2  0.0 - 0.7 K/uL   Basophils Relative 0  0 - 1 %   Basophils Absolute 0.0  0.0 - 0.1 K/uL  TSH     Status: None   Collection Time    08/23/13  1:18 AM      Result Value Ref Range   TSH 1.440  0.350 - 4.500 uIU/mL  TROPONIN I     Status: None   Collection Time    08/23/13  1:18 AM      Result Value Ref Range   Troponin I <0.30  <0.30 ng/mL   Comment:            Due to the release kinetics of cTnI,     a negative result within the first hours     of the onset of symptoms does not rule out     myocardial infarction with certainty.     If myocardial infarction is still suspected,     repeat the test at appropriate intervals.  HEMOGLOBIN A1C     Status: None   Collection Time    08/23/13  1:18 AM      Result Value Ref Range   Hemoglobin A1C 5.2  <5.7 %   Comment: (NOTE)                                                                               According to the ADA Clinical Practice Recommendations for 2011, when     HbA1c is used as a screening test:      >=6.5%    Diagnostic of Diabetes Mellitus               (if abnormal result is confirmed)     5.7-6.4%   Increased risk of developing Diabetes Mellitus     References:Diagnosis and Classification of Diabetes Mellitus,Diabetes     BOER,8412,82(KSHNG 1):S62-S69 and Standards of Medical Care in  Diabetes - 2011,Diabetes Care,2011,34 (Suppl 1):S11-S61.   Mean Plasma Glucose 103  <117 mg/dL   Comment: Performed at [facility]     Status: None   Collection Time    08/23/13  1:18 AM      Result Value Ref Range   Ammonia 51  11 - 60 umol/L  TROPONIN I     Status: None   Collection Time    08/23/13  5:40 AM      Result Value Ref Range   Troponin I <0.30  <0.30 ng/mL   Comment:            Due to the release kinetics of cTnI,     a negative result within the first hours     of the onset of symptoms does not rule out     myocardial infarction with certainty.     If myocardial infarction is still suspected,     repeat the test at appropriate intervals.  URINE RAPID DRUG SCREEN (HOSP PERFORMED)     Status: Abnormal   Collection Time    08/23/13  7:00 AM      Result Value Ref Range   Opiates NONE DETECTED  NONE DETECTED   Cocaine POSITIVE (*) NONE DETECTED   Benzodiazepines NONE DETECTED  NONE DETECTED   Amphetamines NONE DETECTED  NONE DETECTED   Tetrahydrocannabinol POSITIVE (*) NONE DETECTED   Barbiturates NONE DETECTED  NONE DETECTED   Comment:            DRUG SCREEN FOR MEDICAL PURPOSES     ONLY.  IF CONFIRMATION IS NEEDED     FOR ANY PURPOSE, NOTIFY LAB     WITHIN 5 DAYS.                LOWEST DETECTABLE LIMITS     FOR URINE DRUG SCREEN     Drug Class       Cutoff (ng/mL)     Amphetamine      1000     Barbiturate      200     Benzodiazepine   387     Tricyclics       564     Opiates          300     Cocaine          300     THC              50  TROPONIN I     Status: None   Collection Time    08/23/13 12:51 PM      Result Value Ref Range   Troponin I  <0.30  <0.30 ng/mL   Comment:            Due to the release kinetics of cTnI,     a negative result within the first hours     of the onset of symptoms does not rule out     myocardial infarction with certainty.     If myocardial infarction is still suspected,     repeat the test at appropriate intervals.   Dg Chest 2 View  08/22/2013   CLINICAL DATA:  Syncope.  Atrial fibrillation.  Hypertension.  EXAM: CHEST  2 VIEW  COMPARISON:  03/23/2013  FINDINGS: Moderate cardiomegaly and chronic pulmonary venous hypertension are stable. No evidence of acute infiltrate or edema. No evidence pleural effusion. Mild tortuosity of thoracic aorta is unchanged.  IMPRESSION: Stable cardiomegaly and chronic pulmonary  venous hypertension. No acute findings.   Electronically Signed   By: [name]   On: 08/22/2013 23:49   Mr Brain Wo Contrast  08/23/2013   CLINICAL DATA:  Syncope  EXAM: MRI HEAD WITHOUT CONTRAST  TECHNIQUE: Multiplanar, multiecho pulse sequences of the brain and surrounding structures were obtained without intravenous contrast.  COMPARISON:  None.  FINDINGS: Mild atrophy, typical for age. Patchy hyperintensity throughout the cerebral white matter compatible with moderate chronic microvascular ischemia. Mild chronic microvascular ischemia in the pons.  Negative for acute infarct.  Negative for mass or edema.  Chronic micro hemorrhage left thalamus and left temporoparietal lobe.  Craniocervical junction is normal.  Pituitary is normal in size.  Paranasal sinuses are clear.  IMPRESSION: Chronic microvascular ischemic change. No acute infarct. Mild chronic microhemorrhage compatible with chronic hypertension.   Electronically Signed   By: [name]   On: 08/23/2013 08:43    Review Of Systems Constitutional: Negative for fever and chills.  HENT: Negative for neck pain and neck stiffness.  Eyes: Negative for pain.  Respiratory: Positive for shortness of breath.  Cardiovascular: Positive  for chest pain.  Gastrointestinal: Negative for abdominal pain.  Genitourinary: Negative for dysuria.  Musculoskeletal: Positive for back pain. No leg edema  Skin: Negative for rash.  Neurological: Positive for headaches.   Blood pressure 130/81, pulse 53, temperature 97.5 F (36.4 C), temperature source Oral, resp. rate 20, height 5' 8"  (1.727 m), weight 66.452 kg (146 lb 8 oz), SpO2 96.00%. Physical Exam: Gen: Averagely built and nourished.   HENT: Head: Normocephalic and atraumatic. Eyes: Brown eyes, Conjunctivae and EOM are normal. Pupils are equal, round, and reactive to light.  Neck: Normal range of motion. Neck supple. Normal carotid pulses and full JVD present. Cardiovascular: S1 normal, S2 normal and irregular pulses. IV/VI systolic murmur at left sternal border and [place] Pulmonary/Chest: Lungs- basal fine crackles, bilateral to auscultation.   Abdominal: Bowel sounds are normal. Soft and epigastric-tenderness. Musculoskeletal: Normal range of motion. No edema. Neurological: He is alert and oriented to person, place, and time.  Skin: Skin is warm, dry and intact. No rash noted. No cyanosis. + clubbing.  Psychiatric: Some anxiety   Assessment/Plan Syncope Chronic atrial fibrillation with occasional high grade AV block Alcohol use disorder Cocaine use disorder Dilated cardiomyopathy Chronic left heart systolic failure Moderate to severe Mitral regurgitation S/P SVT ablation x 2  Decrease diltiazem by 30 %. Increase activity. May discharge home in AM if stable. Discussed need to refrain from alcohol and cocaine.  Birdie Riddle, MD  08/23/2013, 7:22 PM

## 2013-08-23 NOTE — Progress Notes (Signed)
Utilization Review Completed.[name] T7/09/2013  

## 2013-08-24 DIAGNOSIS — R570 Cardiogenic shock: Secondary | ICD-10-CM

## 2013-08-24 DIAGNOSIS — I10 Essential (primary) hypertension: Secondary | ICD-10-CM

## 2013-08-24 LAB — CBC WITH DIFFERENTIAL/PLATELET
BASOS PCT: 1 % (ref 0–1)
Basophils Absolute: 0.1 10*3/uL (ref 0.0–0.1)
EOS ABS: 0.3 10*3/uL (ref 0.0–0.7)
EOS PCT: 6 % — AB (ref 0–5)
HEMATOCRIT: 36 % — AB (ref 39.0–52.0)
HEMOGLOBIN: 12.3 g/dL — AB (ref 13.0–17.0)
LYMPHS ABS: 1.6 10*3/uL (ref 0.7–4.0)
Lymphocytes Relative: 32 % (ref 12–46)
MCH: 33.8 pg (ref 26.0–34.0)
MCHC: 34.2 g/dL (ref 30.0–36.0)
MCV: 98.9 fL (ref 78.0–100.0)
MONO ABS: 0.5 10*3/uL (ref 0.1–1.0)
Monocytes Relative: 10 % (ref 3–12)
NEUTROS ABS: 2.6 10*3/uL (ref 1.7–7.7)
Neutrophils Relative %: 51 % (ref 43–77)
Platelets: 136 10*3/uL — ABNORMAL LOW (ref 150–400)
RBC: 3.64 MIL/uL — AB (ref 4.22–5.81)
RDW: 12.3 % (ref 11.5–15.5)
WBC: 5.1 10*3/uL (ref 4.0–10.5)

## 2013-08-24 LAB — COMPREHENSIVE METABOLIC PANEL
ALK PHOS: 96 U/L (ref 39–117)
ALT: 18 U/L (ref 0–53)
AST: 28 U/L (ref 0–37)
Albumin: 3 g/dL — ABNORMAL LOW (ref 3.5–5.2)
Anion gap: 13 (ref 5–15)
BUN: 19 mg/dL (ref 6–23)
CO2: 22 mEq/L (ref 19–32)
Calcium: 9 mg/dL (ref 8.4–10.5)
Chloride: 105 mEq/L (ref 96–112)
Creatinine, Ser: 1.38 mg/dL — ABNORMAL HIGH (ref 0.50–1.35)
GFR calc Af Amer: 62 mL/min — ABNORMAL LOW (ref >90)
GFR calc non Af Amer: 53 mL/min — ABNORMAL LOW (ref >90)
GLUCOSE: 94 mg/dL (ref 70–99)
POTASSIUM: 3.9 meq/L (ref 3.7–5.3)
SODIUM: 140 meq/L (ref 137–147)
TOTAL PROTEIN: 6.1 g/dL (ref 6.0–8.3)
Total Bilirubin: 0.7 mg/dL (ref 0.3–1.2)

## 2013-08-24 MED ORDER — SPIRONOLACTONE 12.5 MG HALF TABLET
12.5000 mg | ORAL_TABLET | Freq: Two times a day (BID) | ORAL | Status: DC
  Administered 2013-08-24: 12.5 mg via ORAL
  Filled 2013-08-24 (×3): qty 1

## 2013-08-24 MED ORDER — SPIRONOLACTONE 12.5 MG HALF TABLET: 12.5000 mg | ORAL_TABLET | Freq: Every day | ORAL | Status: DC

## 2013-08-24 NOTE — Progress Notes (Signed)
All d/c instructions explained and given to pt at 1147.  Verbalized understanding.  D/c off floor to awaiting transport.  Declined we/c service.  Karie Kirks, Therapist, sports.

## 2013-08-24 NOTE — Discharge Summary (Signed)
Physician Discharge Summary  [name] IRW:[id number] DOB: 05-24-1950 DOA: 08/22/2013  PCP: [name]  Admit date: 08/22/2013 Discharge date: 08/24/2013  Time spent: 35 minutes  Recommendations for Outpatient Follow-up:  1. Follow up with [name]    Discharge Diagnoses:  Active Problems:   Syncope   AKI (acute kidney injury)   Heart block   Discharge Condition: stable  Diet recommendation: low sodium  Filed Weights   08/23/13 0033 08/24/13 0732  Weight: 66.452 kg (146 lb 8 oz) 67.903 kg (149 lb 11.2 oz)    History of present illness:  63 year old male with past medical history of systolic heart failure, A. fib on anticoagulation stage II chronic kidney disease status post cardioversion and ablation in 2013 and also alcohol abuse comes in for syncope. The patient reported that she was out doing yard work at his house drank 240 ounce beers. Was done in a barbecue became diaphoretic while sitting in the chair. And passed out. As per witness there was some shaking no bladder incontinence. The patient denies any prodromal symptoms.  Hospital Course:  AKI (acute kidney injury): - Most likely due to pre-renal due to ACE-I, diuretics and working out in the yard, with  ETOH use.  - start IV fluid with improvement in renal function - held ACE-I and Lasix.  - resume ACE-I, lasix as an outpatient. - cardiology strated on spironolactone.  Syncope  - dizzy upon standing. - elemetry no events.  - MRI negative.   Procedures:  MRI  Consultations:  cardiology  Discharge Exam: Filed Vitals:   08/24/13 0732  BP: 145/91  Pulse: 67  Temp: 97.8 F (36.6 C)  Resp: 18    General: A&O x3 Cardiovascular: RRR Respiratory: good air movement CTA B/L  Discharge Instructions You were cared for by a hospitalist during your hospital stay. If you have any questions about your discharge medications or the care you received while you were in the hospital after you are  discharged, you can call the unit and asked to speak with the hospitalist on call if the hospitalist that took care of you is not available. Once you are discharged, your primary care physician will handle any further medical issues. Please note that NO REFILLS for any discharge medications will be authorized once you are discharged, as it is imperative that you return to your primary care physician (or establish a relationship with a primary care physician if you do not have one) for your aftercare needs so that they can reassess your need for medications and monitor your lab values.      Discharge Instructions   Diet - low sodium heart healthy    Complete by:  As directed      Increase activity slowly    Complete by:  As directed             Medication List         allopurinol 100 MG tablet  Commonly known as:  ZYLOPRIM  Take 1 tablet by mouth daily.     carvedilol 6.25 MG tablet  Commonly known as:  COREG  Take 6.25 mg by mouth 2 (two) times daily with a meal.     colchicine 0.6 MG tablet  Take 0.6 mg by mouth at bedtime.     digoxin 0.125 MG tablet  Commonly known as:  LANOXIN  Take 1 tablet (0.125 mg total) by mouth daily.     diltiazem 120 MG tablet  Commonly known as:  CARDIZEM  Take 120 mg by mouth 2 (two) times daily.     furosemide 40 MG tablet  Commonly known as:  LASIX  Take 40 mg by mouth daily.     lisinopril 20 MG tablet  Commonly known as:  PRINIVIL,ZESTRIL  Take 0.5 tablets (10 mg total) by mouth daily.     multivitamin with minerals Tabs tablet  Take 1 tablet by mouth daily.     oxyCODONE 5 MG immediate release tablet  Commonly known as:  Oxy IR/ROXICODONE  Take 5 mg by mouth 2 (two) times daily as needed for moderate pain or severe pain.     pantoprazole 40 MG tablet  Commonly known as:  PROTONIX  Take 40 mg by mouth daily as needed (for heartburn).     potassium chloride SA 20 MEQ tablet  Commonly known as:  K-DUR,KLOR-CON  Take 10 mEq by  mouth daily.     rosuvastatin 20 MG tablet  Commonly known as:  CRESTOR  Take 10 mg by mouth every Monday, Wednesday, and Friday.     SAVAYSA 30 MG Tabs tablet  Generic drug:  edoxaban  Take 1 tablet by mouth daily.     spironolactone 12.5 mg Tabs tablet  Commonly known as:  ALDACTONE  Take 0.5 tablets (12.5 mg total) by mouth daily.       No Known Allergies Follow-up Information   Follow up with [name], MD. Schedule an appointment as soon as possible for a visit in 1 week.   Specialty:  Cardiology   Contact information:   [place] [address] [phone]        The results of significant diagnostics from this hospitalization (including imaging, microbiology, ancillary and laboratory) are listed below for reference.    Significant Diagnostic Studies: Dg Chest 2 View  08/22/2013   CLINICAL DATA:  Syncope.  Atrial fibrillation.  Hypertension.  EXAM: CHEST  2 VIEW  COMPARISON:  03/23/2013  FINDINGS: Moderate cardiomegaly and chronic pulmonary venous hypertension are stable. No evidence of acute infiltrate or edema. No evidence pleural effusion. Mild tortuosity of thoracic aorta is unchanged.  IMPRESSION: Stable cardiomegaly and chronic pulmonary venous hypertension. No acute findings.   Electronically Signed   By: [name]   On: 08/22/2013 23:49   Mr Brain Wo Contrast  08/23/2013   CLINICAL DATA:  Syncope  EXAM: MRI HEAD WITHOUT CONTRAST  TECHNIQUE: Multiplanar, multiecho pulse sequences of the brain and surrounding structures were obtained without intravenous contrast.  COMPARISON:  None.  FINDINGS: Mild atrophy, typical for age. Patchy hyperintensity throughout the cerebral white matter compatible with moderate chronic microvascular ischemia. Mild chronic microvascular ischemia in the pons.  Negative for acute infarct.  Negative for mass or edema.  Chronic micro hemorrhage left thalamus and left temporoparietal lobe.  Craniocervical junction is  normal.  Pituitary is normal in size.  Paranasal sinuses are clear.  IMPRESSION: Chronic microvascular ischemic change. No acute infarct. Mild chronic microhemorrhage compatible with chronic hypertension.   Electronically Signed   By: [name]   On: 08/23/2013 08:43    Microbiology: No results found for this or any previous visit (from the past 240 hour(s)).   Labs: Basic Metabolic Panel:  Recent Labs Lab 08/22/13 2223 08/23/13 0118 08/24/13 0305  NA 141 140 140  K 3.8 4.2 3.9  CL 104 104 105  CO2 19 18* 22  GLUCOSE 108* 78 94  BUN 24* 22 19  CREATININE 2.25* 2.00*  1.38*  CALCIUM 9.2 9.1 9.0   Liver Function Tests:  Recent Labs Lab 08/22/13 2223 08/23/13 0118 08/24/13 0305  AST 33 36 28  ALT 20 21 18   ALKPHOS 92 94 96  BILITOT 0.7 0.7 0.7  PROT 6.7 6.9 6.1  ALBUMIN 3.5 3.4* 3.0*   No results found for this basename: LIPASE, AMYLASE,  in the last 168 hours  Recent Labs Lab 08/23/13 0118  AMMONIA 51   CBC:  Recent Labs Lab 08/22/13 2223 08/23/13 0118 08/24/13 0305  WBC 6.0 5.6 5.1  NEUTROABS  --  3.9 2.6  HGB 12.6* 13.3 12.3*  HCT 37.8* 39.7 36.0*  MCV 100.3* 101.3* 98.9  PLT 164 138* 136*   Cardiac Enzymes:  Recent Labs Lab 08/23/13 0118 08/23/13 0540 08/23/13 1251  TROPONINI <0.30 <0.30 <0.30   BNP: BNP (last 3 results)  Recent Labs  03/26/13 0308 03/27/13 0437 08/22/13 2223  PROBNP 2538.0* 2736.0* 1416.0*   CBG: No results found for this basename: GLUCAP,  in the last 168 hours   Signed:  Charlynne Cousins  [facility] 08/24/2013, 8:11 AM

## 2013-08-24 NOTE — Consult Note (Signed)
Ref: Russell Dials, MD  Subjective:  Feeling better. Heart rate improving. Afebrile.  Objective:  Vital Signs in the last 24 hours: Temp:  [97.3 F (36.3 C)-98 F (36.7 C)] 97.8 F (36.6 C) (07/09 0732) Pulse Rate:  [53-97] 67 (07/09 0732) Cardiac Rhythm:  [-] Atrial fibrillation (07/08 1947) Resp:  [18-20] 18 (07/09 0732) BP: (127-153)/(77-93) 145/91 mmHg (07/09 0732) SpO2:  [96 %-99 %] 97 % (07/09 0732) Weight:  [67.903 kg (149 lb 11.2 oz)] 67.903 kg (149 lb 11.2 oz) (07/09 0732)  Physical Exam: BP Readings from Last 1 Encounters:  08/24/13 145/91    Wt Readings from Last 1 Encounters:  08/24/13 67.903 kg (149 lb 11.2 oz)    Weight change:   HEENT: [place]/AT, Eyes-Brown, PERL, EOMI, Conjunctiva-Pink, Sclera-Non-icteric Neck: No JVD, No bruit, Trachea midline. Lungs:  Clearing, Bilateral. Cardiac: Irregular rhythm, normal S1 and S2, no S3. IV/VI systolic murmur at apex of the heart. Abdomen:  Soft, non-tender. Extremities:  No edema present. No cyanosis. No clubbing. CNS: AxOx3, Cranial nerves grossly intact, moves all 4 extremities. Right handed. Skin: Warm and dry.   Intake/Output from previous day: 07/08 0701 - 07/09 0700 In: 720 [P.O.:720] Out: 1075 [Urine:1075]    Lab Results: BMET    Component Value Date/Time   NA 140 08/24/2013 0305   NA 140 08/23/2013 0118   NA 141 08/22/2013 2223   K 3.9 08/24/2013 0305   K 4.2 08/23/2013 0118   K 3.8 08/22/2013 2223   CL 105 08/24/2013 0305   CL 104 08/23/2013 0118   CL 104 08/22/2013 2223   CO2 22 08/24/2013 0305   CO2 18* 08/23/2013 0118   CO2 19 08/22/2013 2223   GLUCOSE 94 08/24/2013 0305   GLUCOSE 78 08/23/2013 0118   GLUCOSE 108* 08/22/2013 2223   BUN 19 08/24/2013 0305   BUN 22 08/23/2013 0118   BUN 24* 08/22/2013 2223   CREATININE 1.38* 08/24/2013 0305   CREATININE 2.00* 08/23/2013 0118   CREATININE 2.25* 08/22/2013 2223   CALCIUM 9.0 08/24/2013 0305   CALCIUM 9.1 08/23/2013 0118   CALCIUM 9.2 08/22/2013 2223   GFRNONAA 53* 08/24/2013 0305    GFRNONAA 34* 08/23/2013 0118   GFRNONAA 30* 08/22/2013 2223   GFRAA 62* 08/24/2013 0305   GFRAA 39* 08/23/2013 0118   GFRAA 34* 08/22/2013 2223   CBC    Component Value Date/Time   WBC 5.1 08/24/2013 0305   RBC 3.64* 08/24/2013 0305   HGB 12.3* 08/24/2013 0305   HCT 36.0* 08/24/2013 0305   PLT 136* 08/24/2013 0305   MCV 98.9 08/24/2013 0305   MCH 33.8 08/24/2013 0305   MCHC 34.2 08/24/2013 0305   RDW 12.3 08/24/2013 0305   LYMPHSABS 1.6 08/24/2013 0305   MONOABS 0.5 08/24/2013 0305   EOSABS 0.3 08/24/2013 0305   BASOSABS 0.1 08/24/2013 0305   HEPATIC Function Panel  Recent Labs  08/22/13 2223 08/23/13 0118 08/24/13 0305  PROT 6.7 6.9 6.1   HEMOGLOBIN A1C No components found with this basename: HGA1C,  MPG   CARDIAC ENZYMES Lab Results  Component Value Date   CKTOTAL 65 09/08/2011   CKMB 3.5 09/08/2011   TROPONINI <0.30 08/23/2013   TROPONINI <0.30 08/23/2013   TROPONINI <0.30 08/23/2013   BNP  Recent Labs  03/26/13 0308 03/27/13 0437 08/22/13 2223  PROBNP 2538.0* 2736.0* 1416.0*   TSH  Recent Labs  08/23/13 0118  TSH 1.440   CHOLESTEROL No results found for this basename: CHOL,  in the last 8760  hours  Scheduled Meds: . allopurinol  100 mg Oral Daily  . atorvastatin  20 mg Oral q1800  . carvedilol  6.25 mg Oral BID WC  . colchicine  0.6 mg Oral QHS  . digoxin  0.125 mg Oral Daily  . diltiazem  90 mg Oral BID  . edoxaban  30 mg Oral Daily  . lisinopril  10 mg Oral Daily  . multivitamin with minerals  1 tablet Oral Daily  . potassium chloride SA  10 mEq Oral Daily  . banana bag IV 1000 mL   Intravenous Once  . sodium chloride  3 mL Intravenous Q12H  . spironolactone  12.5 mg Oral BID   Continuous Infusions:  PRN Meds:.oxyCODONE, pantoprazole  Assessment/Plan: Syncope  Chronic atrial fibrillation with occasional high grade AV block  Alcohol use disorder  Cocaine use disorder  Dilated cardiomyopathy  Chronic left heart systolic failure  Moderate to severe Mitral regurgitation   S/P SVT ablation x 2  Add small dose spironolactone. Increase activity.  May discharge home with follow up by me in 1 week.   LOS: 2 days    Russell Dials  MD  08/24/2013, 7:42 AM

## 2013-09-30 ENCOUNTER — Ambulatory Visit (HOSPITAL_COMMUNITY): Payer: Medicare HMO

## 2013-09-30 ENCOUNTER — Encounter (HOSPITAL_COMMUNITY): Payer: Self-pay | Admitting: Emergency Medicine

## 2013-09-30 ENCOUNTER — Inpatient Hospital Stay (HOSPITAL_COMMUNITY)
Admission: EM | Admit: 2013-09-30 | Discharge: 2013-10-07 | DRG: 242 | Disposition: A | Discharge status: Home / Self Care | Payer: Medicare HMO | Source: Intra-hospital | Attending: Cardiovascular Disease | Admitting: Cardiovascular Disease

## 2013-09-30 ENCOUNTER — Encounter (HOSPITAL_COMMUNITY)
Admission: EM | Disposition: A | Discharge status: Home / Self Care | Payer: Commercial Managed Care - HMO | Attending: Cardiovascular Disease

## 2013-09-30 ENCOUNTER — Inpatient Hospital Stay (HOSPITAL_COMMUNITY): Payer: Medicare HMO

## 2013-09-30 DIAGNOSIS — Z833 Family history of diabetes mellitus: Secondary | ICD-10-CM | POA: Diagnosis not present

## 2013-09-30 DIAGNOSIS — N182 Chronic kidney disease, stage 2 (mild): Secondary | ICD-10-CM | POA: Diagnosis present

## 2013-09-30 DIAGNOSIS — R55 Syncope and collapse: Secondary | ICD-10-CM | POA: Diagnosis present

## 2013-09-30 DIAGNOSIS — E860 Dehydration: Secondary | ICD-10-CM | POA: Diagnosis present

## 2013-09-30 DIAGNOSIS — Z801 Family history of malignant neoplasm of trachea, bronchus and lung: Secondary | ICD-10-CM

## 2013-09-30 DIAGNOSIS — I498 Other specified cardiac arrhythmias: Secondary | ICD-10-CM

## 2013-09-30 DIAGNOSIS — I129 Hypertensive chronic kidney disease with stage 1 through stage 4 chronic kidney disease, or unspecified chronic kidney disease: Secondary | ICD-10-CM | POA: Diagnosis present

## 2013-09-30 DIAGNOSIS — N189 Chronic kidney disease, unspecified: Secondary | ICD-10-CM

## 2013-09-30 DIAGNOSIS — K219 Gastro-esophageal reflux disease without esophagitis: Secondary | ICD-10-CM | POA: Diagnosis present

## 2013-09-30 DIAGNOSIS — M109 Gout, unspecified: Secondary | ICD-10-CM | POA: Diagnosis present

## 2013-09-30 DIAGNOSIS — G9340 Encephalopathy, unspecified: Secondary | ICD-10-CM | POA: Diagnosis present

## 2013-09-30 DIAGNOSIS — I5022 Chronic systolic (congestive) heart failure: Secondary | ICD-10-CM | POA: Diagnosis not present

## 2013-09-30 DIAGNOSIS — Z823 Family history of stroke: Secondary | ICD-10-CM

## 2013-09-30 DIAGNOSIS — J4489 Other specified chronic obstructive pulmonary disease: Secondary | ICD-10-CM | POA: Diagnosis present

## 2013-09-30 DIAGNOSIS — I4892 Unspecified atrial flutter: Secondary | ICD-10-CM | POA: Diagnosis present

## 2013-09-30 DIAGNOSIS — F1010 Alcohol abuse, uncomplicated: Secondary | ICD-10-CM | POA: Diagnosis present

## 2013-09-30 DIAGNOSIS — Z87891 Personal history of nicotine dependence: Secondary | ICD-10-CM

## 2013-09-30 DIAGNOSIS — I2510 Atherosclerotic heart disease of native coronary artery without angina pectoris: Secondary | ICD-10-CM | POA: Diagnosis present

## 2013-09-30 DIAGNOSIS — I4891 Unspecified atrial fibrillation: Secondary | ICD-10-CM | POA: Diagnosis present

## 2013-09-30 DIAGNOSIS — J449 Chronic obstructive pulmonary disease, unspecified: Secondary | ICD-10-CM | POA: Diagnosis present

## 2013-09-30 DIAGNOSIS — I509 Heart failure, unspecified: Secondary | ICD-10-CM | POA: Diagnosis present

## 2013-09-30 DIAGNOSIS — N179 Acute kidney failure, unspecified: Secondary | ICD-10-CM | POA: Diagnosis not present

## 2013-09-30 DIAGNOSIS — E875 Hyperkalemia: Secondary | ICD-10-CM | POA: Diagnosis present

## 2013-09-30 DIAGNOSIS — J9600 Acute respiratory failure, unspecified whether with hypoxia or hypercapnia: Secondary | ICD-10-CM

## 2013-09-30 DIAGNOSIS — I5023 Acute on chronic systolic (congestive) heart failure: Secondary | ICD-10-CM | POA: Diagnosis present

## 2013-09-30 DIAGNOSIS — I442 Atrioventricular block, complete: Secondary | ICD-10-CM | POA: Diagnosis present

## 2013-09-30 DIAGNOSIS — I428 Other cardiomyopathies: Secondary | ICD-10-CM | POA: Diagnosis present

## 2013-09-30 DIAGNOSIS — I4430 Unspecified atrioventricular block: Secondary | ICD-10-CM | POA: Diagnosis present

## 2013-09-30 DIAGNOSIS — I495 Sick sinus syndrome: Secondary | ICD-10-CM | POA: Diagnosis present

## 2013-09-30 DIAGNOSIS — R001 Bradycardia, unspecified: Secondary | ICD-10-CM

## 2013-09-30 DIAGNOSIS — I429 Cardiomyopathy, unspecified: Secondary | ICD-10-CM

## 2013-09-30 DIAGNOSIS — Z789 Other specified health status: Secondary | ICD-10-CM

## 2013-09-30 DIAGNOSIS — I469 Cardiac arrest, cause unspecified: Secondary | ICD-10-CM

## 2013-09-30 HISTORY — PX: LEFT HEART CATHETERIZATION WITH CORONARY ANGIOGRAM: SHX5451

## 2013-09-30 HISTORY — PX: TEMPORARY PACEMAKER INSERTION: SHX5471

## 2013-09-30 LAB — COMPREHENSIVE METABOLIC PANEL
ALBUMIN: 3.8 g/dL (ref 3.5–5.2)
ALT: 36 U/L (ref 0–53)
ANION GAP: 14 (ref 5–15)
AST: 54 U/L — ABNORMAL HIGH (ref 0–37)
Alkaline Phosphatase: 82 U/L (ref 39–117)
BUN: 62 mg/dL — AB (ref 6–23)
CALCIUM: 9.6 mg/dL (ref 8.4–10.5)
CO2: 20 mEq/L (ref 19–32)
CREATININE: 3.7 mg/dL — AB (ref 0.50–1.35)
Chloride: 97 mEq/L (ref 96–112)
GFR calc Af Amer: 19 mL/min — ABNORMAL LOW (ref >90)
GFR calc non Af Amer: 16 mL/min — ABNORMAL LOW (ref >90)
Glucose, Bld: 106 mg/dL — ABNORMAL HIGH (ref 70–99)
Potassium: 7.8 mEq/L (ref 3.7–5.3)
Sodium: 131 mEq/L — ABNORMAL LOW (ref 137–147)
Total Bilirubin: 0.6 mg/dL (ref 0.3–1.2)
Total Protein: 7.1 g/dL (ref 6.0–8.3)

## 2013-09-30 LAB — CBC WITH DIFFERENTIAL/PLATELET
BASOS ABS: 0.1 10*3/uL (ref 0.0–0.1)
Basophils Relative: 1 % (ref 0–1)
EOS ABS: 0.2 10*3/uL (ref 0.0–0.7)
EOS PCT: 3 % (ref 0–5)
HEMATOCRIT: 35.3 % — AB (ref 39.0–52.0)
HEMOGLOBIN: 11.9 g/dL — AB (ref 13.0–17.0)
Lymphocytes Relative: 21 % (ref 12–46)
Lymphs Abs: 1.6 10*3/uL (ref 0.7–4.0)
MCH: 34.1 pg — AB (ref 26.0–34.0)
MCHC: 33.7 g/dL (ref 30.0–36.0)
MCV: 101.1 fL — ABNORMAL HIGH (ref 78.0–100.0)
MONOS PCT: 6 % (ref 3–12)
Monocytes Absolute: 0.5 10*3/uL (ref 0.1–1.0)
Neutro Abs: 5.5 10*3/uL (ref 1.7–7.7)
Neutrophils Relative %: 70 % (ref 43–77)
Platelets: 204 10*3/uL (ref 150–400)
RBC: 3.49 MIL/uL — ABNORMAL LOW (ref 4.22–5.81)
RDW: 11.8 % (ref 11.5–15.5)
WBC: 7.9 10*3/uL (ref 4.0–10.5)

## 2013-09-30 LAB — I-STAT TROPONIN, ED: TROPONIN I, POC: 0.12 ng/mL — AB (ref 0.00–0.08)

## 2013-09-30 LAB — I-STAT CG4 LACTIC ACID, ED: LACTIC ACID, VENOUS: 2.37 mmol/L — AB (ref 0.5–2.2)

## 2013-09-30 LAB — I-STAT CHEM 8, ED
BUN: 58 mg/dL — AB (ref 6–23)
Calcium, Ion: 1.17 mmol/L (ref 1.13–1.30)
Chloride: 106 mEq/L (ref 96–112)
Creatinine, Ser: 3.8 mg/dL — ABNORMAL HIGH (ref 0.50–1.35)
GLUCOSE: 107 mg/dL — AB (ref 70–99)
HCT: 37 % — ABNORMAL LOW (ref 39.0–52.0)
Hemoglobin: 12.6 g/dL — ABNORMAL LOW (ref 13.0–17.0)
Potassium: 7.4 mEq/L (ref 3.7–5.3)
SODIUM: 128 meq/L — AB (ref 137–147)
TCO2: 25 mmol/L (ref 0–100)

## 2013-09-30 LAB — BASIC METABOLIC PANEL
ANION GAP: 17 — AB (ref 5–15)
Anion gap: 18 — ABNORMAL HIGH (ref 5–15)
BUN: 62 mg/dL — ABNORMAL HIGH (ref 6–23)
BUN: 59 mg/dL — ABNORMAL HIGH (ref 6–23)
CALCIUM: 9.6 mg/dL (ref 8.4–10.5)
CO2: 16 mEq/L — ABNORMAL LOW (ref 19–32)
CO2: 20 mEq/L (ref 19–32)
Calcium: 9.7 mg/dL (ref 8.4–10.5)
Chloride: 97 mEq/L (ref 96–112)
Chloride: 98 mEq/L (ref 96–112)
Creatinine, Ser: 3.16 mg/dL — ABNORMAL HIGH (ref 0.50–1.35)
Creatinine, Ser: 2.84 mg/dL — ABNORMAL HIGH (ref 0.50–1.35)
GFR calc Af Amer: 23 mL/min — ABNORMAL LOW (ref >90)
GFR calc Af Amer: 26 mL/min — ABNORMAL LOW (ref >90)
GFR calc non Af Amer: 19 mL/min — ABNORMAL LOW (ref >90)
GFR, EST NON AFRICAN AMERICAN: 22 mL/min — AB (ref >90)
GLUCOSE: 94 mg/dL (ref 70–99)
Glucose, Bld: 153 mg/dL — ABNORMAL HIGH (ref 70–99)
POTASSIUM: 4.3 meq/L (ref 3.7–5.3)
Potassium: 7.3 mEq/L (ref 3.7–5.3)
SODIUM: 130 meq/L — AB (ref 137–147)
Sodium: 136 mEq/L — ABNORMAL LOW (ref 137–147)

## 2013-09-30 LAB — POCT I-STAT 3, ART BLOOD GAS (G3+)
ACID-BASE EXCESS: 2 mmol/L (ref 0.0–2.0)
Bicarbonate: 24.8 mEq/L — ABNORMAL HIGH (ref 20.0–24.0)
O2 Saturation: 100 %
PCO2 ART: 33 mmHg — AB (ref 35.0–45.0)
TCO2: 26 mmol/L (ref 0–100)
pH, Arterial: 7.484 — ABNORMAL HIGH (ref 7.350–7.450)
pO2, Arterial: 521 mmHg — ABNORMAL HIGH (ref 80.0–100.0)

## 2013-09-30 LAB — PRO B NATRIURETIC PEPTIDE: Pro B Natriuretic peptide (BNP): 1528 pg/mL — ABNORMAL HIGH (ref 0–125)

## 2013-09-30 LAB — PROTIME-INR
INR: 2.53 — ABNORMAL HIGH (ref 0.00–1.49)
Prothrombin Time: 27.3 seconds — ABNORMAL HIGH (ref 11.6–15.2)

## 2013-09-30 LAB — DIGOXIN LEVEL: DIGOXIN LVL: 1.4 ng/mL (ref 0.8–2.0)

## 2013-09-30 LAB — GLUCOSE, CAPILLARY: Glucose-Capillary: 92 mg/dL (ref 70–99)

## 2013-09-30 LAB — MRSA PCR SCREENING: MRSA by PCR: NEGATIVE

## 2013-09-30 SURGERY — TEMPORARY PACEMAKER INSERTION
Anesthesia: LOCAL

## 2013-09-30 MED ORDER — SODIUM CHLORIDE 0.9 % IV SOLN
1.0000 g | Freq: Once | INTRAVENOUS | Status: DC
  Filled 2013-09-30: qty 10

## 2013-09-30 MED ORDER — CETYLPYRIDINIUM CHLORIDE 0.05 % MT LIQD
7.0000 mL | Freq: Four times a day (QID) | OROMUCOSAL | Status: DC
  Administered 2013-10-01 – 2013-10-02 (×6): 7 mL via OROMUCOSAL

## 2013-09-30 MED ORDER — SODIUM BICARBONATE 8.4 % IV SOLN
INTRAVENOUS | Status: DC
  Administered 2013-09-30: 18:00:00 via INTRAVENOUS
  Filled 2013-09-30 (×2): qty 150

## 2013-09-30 MED ORDER — DEXTROSE-NACL 5-0.9 % IV SOLN
INTRAVENOUS | Status: DC
  Administered 2013-09-30: 100 mL/h via INTRAVENOUS

## 2013-09-30 MED ORDER — PNEUMOCOCCAL VAC POLYVALENT 25 MCG/0.5ML IJ INJ
0.5000 mL | INJECTION | INTRAMUSCULAR | Status: DC
  Filled 2013-09-30: qty 0.5

## 2013-09-30 MED ORDER — MIDAZOLAM HCL 5 MG/ML IJ SOLN
1.0000 mg/h | INTRAMUSCULAR | Status: DC
  Administered 2013-09-30 (×2): 5 mg/h via INTRAVENOUS
  Administered 2013-10-01: 4 mg/h via INTRAVENOUS
  Filled 2013-09-30 (×3): qty 10

## 2013-09-30 MED ORDER — ACETAMINOPHEN 325 MG PO TABS: 650.0000 mg | ORAL_TABLET | ORAL | Status: DC | PRN

## 2013-09-30 MED ORDER — FENTANYL CITRATE 0.05 MG/ML IJ SOLN
50.0000 ug | Freq: Once | INTRAMUSCULAR | Status: AC
  Administered 2013-09-30: 50 ug via INTRAVENOUS

## 2013-09-30 MED ORDER — SODIUM POLYSTYRENE SULFONATE 15 GM/60ML PO SUSP
60.0000 g | ORAL | Status: AC
  Administered 2013-09-30: 60 g
  Filled 2013-09-30 (×2): qty 240

## 2013-09-30 MED ORDER — LIDOCAINE HCL (PF) 1 % IJ SOLN
INTRAMUSCULAR | Status: AC
  Filled 2013-09-30: qty 30

## 2013-09-30 MED ORDER — CHLORHEXIDINE GLUCONATE 0.12 % MT SOLN
15.0000 mL | Freq: Two times a day (BID) | OROMUCOSAL | Status: DC
  Administered 2013-09-30 – 2013-10-02 (×4): 15 mL via OROMUCOSAL
  Filled 2013-09-30 (×4): qty 15

## 2013-09-30 MED ORDER — SODIUM BICARBONATE 8.4 % IV SOLN
INTRAVENOUS | Status: AC
  Filled 2013-09-30: qty 50

## 2013-09-30 MED ORDER — FENTANYL CITRATE 0.05 MG/ML IJ SOLN
INTRAMUSCULAR | Status: AC
Start: 2013-09-30 — End: 2013-10-01
  Filled 2013-09-30: qty 2

## 2013-09-30 MED ORDER — HEPARIN SODIUM (PORCINE) 1000 UNIT/ML IJ SOLN
INTRAMUSCULAR | Status: AC
  Filled 2013-09-30: qty 1

## 2013-09-30 MED ORDER — SODIUM POLYSTYRENE SULFONATE 15 GM/60ML PO SUSP
30.0000 g | Freq: Once | ORAL | Status: AC
  Administered 2013-09-30: 30 g

## 2013-09-30 MED ORDER — ETOMIDATE 2 MG/ML IV SOLN: 3.0000 mg/kg | Freq: Once | INTRAVENOUS | Status: DC

## 2013-09-30 MED ORDER — ETOMIDATE 2 MG/ML IV SOLN
INTRAVENOUS | Status: AC | PRN
  Administered 2013-09-30: 20 mg via INTRAVENOUS

## 2013-09-30 MED ORDER — HEPARIN (PORCINE) IN NACL 2-0.9 UNIT/ML-% IJ SOLN
INTRAMUSCULAR | Status: AC
  Filled 2013-09-30: qty 1000

## 2013-09-30 MED ORDER — FAMOTIDINE 40 MG/5ML PO SUSR
20.0000 mg | Freq: Every day | ORAL | Status: DC
  Administered 2013-09-30: 20 mg
  Filled 2013-09-30 (×2): qty 2.5

## 2013-09-30 MED ORDER — SODIUM CHLORIDE 0.9 % IV SOLN: INTRAVENOUS | Status: DC

## 2013-09-30 MED ORDER — SUCCINYLCHOLINE CHLORIDE 20 MG/ML IJ SOLN
INTRAMUSCULAR | Status: AC | PRN
  Administered 2013-09-30: 150 mg via INTRAVENOUS

## 2013-09-30 MED ORDER — SODIUM CHLORIDE 0.9 % IV SOLN
50.0000 ug/h | INTRAVENOUS | Status: DC
  Administered 2013-09-30: 50 ug/h via INTRAVENOUS
  Filled 2013-09-30: qty 50

## 2013-09-30 MED ORDER — ONDANSETRON HCL 4 MG/2ML IJ SOLN: 4.0000 mg | Freq: Four times a day (QID) | INTRAMUSCULAR | Status: DC | PRN

## 2013-09-30 MED ORDER — SODIUM CHLORIDE 0.9 % IV SOLN
1.0000 g | Freq: Once | INTRAVENOUS | Status: AC
  Administered 2013-09-30: 1 g via INTRAVENOUS
  Filled 2013-09-30: qty 10

## 2013-09-30 MED ORDER — ATROPINE SULFATE 1 MG/ML IJ SOLN
0.5000 mg | Freq: Once | INTRAMUSCULAR | Status: AC
  Administered 2013-09-30: 0.5 mg via INTRAVENOUS

## 2013-09-30 MED ORDER — FUROSEMIDE 10 MG/ML IJ SOLN
20.0000 mg | Freq: Once | INTRAMUSCULAR | Status: AC
  Administered 2013-09-30: 20 mg via INTRAVENOUS
  Filled 2013-09-30: qty 2

## 2013-09-30 MED ORDER — FUROSEMIDE 10 MG/ML IJ SOLN
INTRAMUSCULAR | Status: AC
  Filled 2013-09-30: qty 4

## 2013-09-30 NOTE — Significant Event (Signed)
Rapid Response Event Note  Overview:  Called for Code STEMI.    Initial Focused Assessment: Pateint arrived via EMS; externally paced.  Interventions: Assisted team with interventions, assessments, and transport to the cath lab.  Event Summary:   at      at          Baron Hamper

## 2013-09-30 NOTE — CV Procedure (Signed)
PROCEDURE:  Left heart catheterization with selective coronary angiography, left ventriculogram.  CLINICAL HISTORY:  This is a 63 year old male with syncope and severe bradycardia.  The risks, benefits, and details of the procedure were explained to the patient.  The patient verbalized understanding and wanted to proceed.  Informed written consent was obtained.  PROCEDURE TECHNIQUE:  The patient was approached from the right femoral artery using a 5 French short sheath.  Left coronary angiography was done using a Judkins L4 guide catheter.  Right coronary angiography was done using a Judkins R4 guide catheter.  Left ventriculography was done using a pigtail catheter.    CONTRAST:  Total of less than 50 cc.  COMPLICATIONS:  None.  At the end of the procedure a manual device was used for hemostasis.    HEMODYNAMICS:  Aortic pressure was 110/70; LV pressure was 110/5; LVEDP 9.  There was no gradient between the left ventricle and aorta.    ANGIOGRAM/CORONARY ARTERIOGRAM:   The left main coronary artery is unremarkable.  The left anterior descending artery has proximal luminal irregularities.  The left circumflex artery is unremarkable.  The right coronary artery is dominant and has luminal irregularities only.  LEFT VENTRICULOGRAM:  Left ventricular angiogram was not done Systolic function by recent echocardiogram showed severe hypokinesia with an estimated ejection fraction of 25 %.  LVEDP was 9 mmHg.  IMPRESSION OF HEART CATHETERIZATION:   1. Normal left main coronary artery. 2. Minimal disease of left anterior descending artery and its branches. 3. Normal left circumflex artery and its branches. 4. Minimal disease of dominant right coronary artery. 5. Severe left ventricular systolic dysfunction.  LVEDP 9 mmHg.  Ejection fraction 25 % by prior echocardiogram.  RECOMMENDATION:   Medical treatment/Transvenous pacemaker.

## 2013-09-30 NOTE — Progress Notes (Signed)
Patient came to ED via EMS pacing ED physician intubated with a 7.5 ETT taped at 24 cm at lips, good color change on end tidal co2 detector, good clear BBS ausculted, and X-ray showed a proper placement per ED physician, patient transported to [facility] lab immediatly after intubation.

## 2013-09-30 NOTE — Progress Notes (Signed)
ANTICOAGULATION CONSULT NOTE - Initial Consult  Pharmacy Consult for edoxaban Indication: atrial fibrillation  No Known Allergies  Patient Measurements: Height: 5\' 9"  (175.3 cm) Weight: 139 lb 12.4 oz (63.4 kg) IBW/kg (Calculated) : 70.7 Heparin Dosing Weight:   Vital Signs: Temp: 97.8 F (36.6 C) (08/15 1500) Temp src: Oral (08/15 1500) BP: 112/76 mmHg (08/15 1530) Pulse Rate: 80 (08/15 1515)  Labs:  Recent Labs  09/30/13 1301 09/30/13 1310  HGB 11.9* 12.6*  HCT 35.3* 37.0*  PLT 204  --   LABPROT 27.3*  --   INR 2.53*  --   CREATININE 3.70* 3.80*    Estimated Creatinine Clearance: 17.8 ml/min (by C-G formula based on Cr of 3.8).   Medical History: Past Medical History  Diagnosis Date  . Hypertension   . GERD (gastroesophageal reflux disease)   . Hiatal hernia   . CHF (congestive heart failure)   . Cardiomyopathy     nonischemic   . COPD (chronic obstructive pulmonary disease)     previously listed in DC summary from Feb 2012   . Chronic kidney disease (CKD), stage II (mild)     listed in chart although patient denies   . Atrial fibrillation      previously listed in DC summary from August 31, 2011, ? atrial tachycardia   . SVT (supraventricular tachycardia)   . Heart murmur     "dx'd in middle school" (12/22/2012)  . Pneumonia     "once when I was a baby; probably 3-4 times since then" (12/22/2012)  . Shortness of breath     "all the time last couple months" (12/22/2012)  . WJXBJYNW(295.6)     "weekly" (01/01/2013)  . Arthritis     "neck" (12/22/2012)  . Gout   . Depression     Medications:  Prescriptions prior to admission  Medication Sig Dispense Refill  . allopurinol (ZYLOPRIM) 100 MG tablet Take 100 mg by mouth daily.       . carvedilol (COREG) 6.25 MG tablet Take 6.25 mg by mouth 2 (two) times daily with a meal.      . colchicine 0.6 MG tablet Take 0.6 mg by mouth 3 (three) times a week.       . digoxin (LANOXIN) 0.125 MG tablet Take 1 tablet  (0.125 mg total) by mouth daily.  30 tablet  3  . diltiazem (CARDIZEM) 120 MG tablet Take 120 mg by mouth 2 (two) times daily.      . Edoxaban Tosylate (SAVAYSA) 30 MG TABS Take 30 mg by mouth daily.       . furosemide (LASIX) 40 MG tablet Take 40 mg by mouth daily.       Marland Kitchen lisinopril (PRINIVIL,ZESTRIL) 20 MG tablet Take 0.5 tablets (10 mg total) by mouth daily.      . Multiple Vitamin (MULTIVITAMIN WITH MINERALS) TABS tablet Take 1 tablet by mouth daily.      Marland Kitchen oxyCODONE (OXY IR/ROXICODONE) 5 MG immediate release tablet Take 5 mg by mouth 2 (two) times daily as needed for moderate pain or severe pain.      . pantoprazole (PROTONIX) 40 MG tablet Take 40 mg by mouth daily as needed (for heartburn).       . potassium chloride SA (K-DUR,KLOR-CON) 20 MEQ tablet Take 10 mEq by mouth daily.       . rosuvastatin (CRESTOR) 20 MG tablet Take 10 mg by mouth every Monday, Wednesday, and Friday.      Marland Kitchen spironolactone (ALDACTONE) 12.5 mg  TABS tablet Take 0.5 tablets (12.5 mg total) by mouth daily.  30 tablet  0   Scheduled:  . famotidine  20 mg Per Tube Daily  . sodium polystyrene  30 g Per Tube Once  . sodium polystyrene  60 g Per Tube Q4H    Assessment: 63yo who was taken to an emergent cath. He has been on edoxaban for afib. Plan is to hold x24 per MD before cath removal. Will start tomorrow.   Goal of Therapy:   Monitor platelets by anticoagulation protocol: Yes   Plan:   F/u starting Edoxaban

## 2013-09-30 NOTE — ED Notes (Addendum)
CAPTURING AT 88 Ma, HR 90.

## 2013-09-30 NOTE — ED Notes (Signed)
[place]. [facility] AT [place] [address] [place]. PLAN FOR INTUBATION AND PACEMAKER.

## 2013-09-30 NOTE — CV Procedure (Signed)
Transvenous temporary pacemaker placement Procedure Note [name] [id number] Aug 01, 1950  Procedure: Insertion of Transvenous temporary pacemaker placement Indications: Severe bradycardia  Procedure Details Consent: Unable to obtain consent because of emergent medical necessity. Time Out: Verified patient identification, verified procedure, site/side was marked, verified correct patient position, special equipment/implants available, medications/allergies/relevent history reviewed, required imaging and test results available.  Performed  Maximum sterile technique was used including antiseptics, cap, gloves, gown, hand hygiene, mask and sheet. Skin prep: Chlorhexidine; local anesthetic administered A balloon tipped 5 french catheter was placed through 6 french venous sheath in the right femoral vein due to emergent situation using the Seldinger technique. It was advanced under fluoroscopy and placed in RV apex. 55 cm length from right groin venous sheath hub. With 1 mA threshold, setting of 5 mA and rate of 80 was selected with good capture. Lines were sutured.  Evaluation Complications: No apparent complications Patient did tolerate procedure well. Chest X-ray ordered to verify placement.  CXR: not done due to fluoroscopy use in cath lab.  [name] S 09/30/2013, 3:45 PM

## 2013-09-30 NOTE — ED Notes (Signed)
Capturing at 86 milliamps; 88 HR

## 2013-09-30 NOTE — ED Notes (Signed)
All elevated results given to Dr. Dina Rich that were POCT

## 2013-09-30 NOTE — Consult Note (Signed)
PULMONARY / CRITICAL CARE MEDICINE   Name: [name] MRN: [id number] DOB: November 16, 1950    ADMISSION DATE:  09/30/2013 CONSULTATION DATE:    Loistine Chance MD :  Doylene Canard  INITIAL PRESENTATION:  H/O cardiomyopathy - EF 35-40% 08/23/13. Presented to ED with syncope due to bradycardic arrest. Intubated in ED. Taken to cath lab by Dr Doylene Canard for [facility] and temp pacer placement. Found to be profoundly hyperkalemic. 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 LVEF 25%    HISTORY OF PRESENT ILLNESS:   No family to provide history. All records reviewed and discussed with Dr Doylene Canard  PAST MEDICAL HISTORY :  Past Medical History  Diagnosis Date  . Hypertension   . GERD (gastroesophageal reflux disease)   . Hiatal hernia   . CHF (congestive heart failure)   . Cardiomyopathy     nonischemic   . COPD (chronic obstructive pulmonary disease)     previously listed in DC summary from Feb 2012   . Chronic kidney disease (CKD), stage II (mild)     listed in chart although patient denies   . Atrial fibrillation      previously listed in DC summary from August 31, 2011, ? atrial tachycardia   . SVT (supraventricular tachycardia)   . Heart murmur     "dx'd in middle school" (12/22/2012)  . Pneumonia     "once when I was a baby; probably 3-4 times since then" (12/22/2012)  . Shortness of breath     "all the time last couple months" (12/22/2012)  . KGYJEHUD(149.7)     "weekly" (01/01/2013)  . Arthritis     "neck" (12/22/2012)  . Gout   . Depression    Past Surgical History  Procedure Laterality Date  . Tee without cardioversion N/A 05/11/2012    Procedure: TRANSESOPHAGEAL ECHOCARDIOGRAM (TEE);  Surgeon: Birdie Riddle, MD;  [facility] ENDOSCOPY;  Service: Cardiovascular;  Laterality: N/A;  . Cardioversion N/A 05/11/2012    Procedure: Electrocardioversion, external;  Surgeon: Birdie Riddle, MD;   Location: [place];  Service: Cardiovascular;  Laterality: N/A;  . Cardioversion N/A 05/11/2012    Procedure: CARDIOVERSION AT THE BEDSIDE;  Surgeon: Birdie Riddle, MD;  Location: [place];  Service: Cardiovascular;  Laterality: N/A;  . Supraventricular tachycardia ablation  08/2011; 09/2011  . Cardiac catheterization  08/2011   Prior to Admission medications   Medication Sig Start Date End Date Taking? Authorizing Provider  allopurinol (ZYLOPRIM) 100 MG tablet Take 100 mg by mouth daily.  07/21/13  Yes Historical Provider, MD  carvedilol (COREG) 6.25 MG tablet Take 6.25 mg by mouth 2 (two) times daily with a meal.   Yes Historical Provider, MD  colchicine 0.6 MG tablet Take 0.6 mg by mouth 3 (three) times a week.    Yes Historical Provider, MD  digoxin (LANOXIN) 0.125 MG tablet Take 1 tablet (0.125 mg total) by mouth daily. 03/29/13  Yes Birdie Riddle, MD  diltiazem (CARDIZEM) 120 MG tablet Take 120 mg by mouth 2 (two) times daily.   Yes Historical Provider, MD  Edoxaban Tosylate (SAVAYSA) 30 MG TABS Take 30 mg by mouth daily.    Yes Historical Provider, MD  furosemide (LASIX) 40 MG tablet Take 40 mg by mouth daily.    Yes Historical Provider, MD  lisinopril (PRINIVIL,ZESTRIL) 20 MG tablet Take 0.5 tablets (10 mg total) by mouth daily. 03/29/13  Yes Birdie Riddle, MD  Multiple Vitamin (MULTIVITAMIN WITH MINERALS) TABS tablet Take 1 tablet by mouth daily.   Yes Historical Provider, MD  oxyCODONE (OXY IR/ROXICODONE) 5 MG immediate release tablet Take 5 mg by mouth 2 (two) times daily as needed for moderate pain or severe pain.   Yes Historical Provider, MD  pantoprazole (PROTONIX) 40 MG tablet Take 40 mg by mouth daily as needed (for heartburn).    Yes Historical Provider, MD  potassium chloride SA (K-DUR,KLOR-CON) 20 MEQ tablet Take 10 mEq by mouth daily.    Yes Historical Provider, MD  rosuvastatin (CRESTOR) 20 MG tablet Take 10 mg by mouth every Monday, Wednesday, and Friday.   Yes Historical  Provider, MD  spironolactone (ALDACTONE) 12.5 mg TABS tablet Take 0.5 tablets (12.5 mg total) by mouth daily. 08/24/13  Yes Charlynne Cousins, MD   No Known Allergies  FAMILY HISTORY:  Family History  Problem Relation Age of Onset  . Lung cancer Mother     passed away from lung cancer  . Pneumonia Father     passed sway from PNA.  . Stroke Sister   . Diabetes Sister   . Seizures Brother   . Seizures Brother    SOCIAL HISTORY:  reports that he quit smoking about 2 years ago. His smoking use included Cigarettes. He has a 3.75 pack-year smoking history. He has never used smokeless tobacco. He reports that he drinks about 3.6 ounces of alcohol per week. He reports that he uses illicit drugs (Cocaine and Marijuana).  REVIEW OF SYSTEMS:  N/A  SUBJECTIVE:   VITAL SIGNS: Temp:  [97.8 F (36.6 C)] 97.8 F (36.6 C) (08/15 1500) Pulse Rate:  [80-91] 80 (08/15 1800) Resp:  [15-28] 15 (08/15 1800) BP: (101-175)/(71-139) 122/85 mmHg (08/15 1800) SpO2:  [100 %] 100 % (08/15 1800) Arterial Line BP: (102-131)/(63-76) 131/76 mmHg (08/15 1800) FiO2 (%):  [30 %-100 %] 30 % (08/15 1614) Weight:  [63.4 kg (139 lb 12.4 oz)] 63.4 kg (139 lb 12.4 oz) (08/15 1500) HEMODYNAMICS:   VENTILATOR SETTINGS: Vent Mode:  [-] PRVC FiO2 (%):  [30 %-100 %] 30 % Set Rate:  [15 bmp] 15 bmp Vt Set:  [500 mL-600 mL] 500 mL PEEP:  [5 cmH20] 5 cmH20 Plateau Pressure:  [15 ASN05-39 cmH20] 15 cmH20 INTAKE / OUTPUT:  Intake/Output Summary (Last 24 hours) at 09/30/13 1826 Last data filed at 09/30/13 1800  Gross per 24 hour  Intake 314.58 ml  Output    525 ml  Net -210.42 ml    PHYSICAL EXAMINATION: General:  Sedated, NAD Neuro:  MAEs, CNs intact, no focal deficits HEENT: WNL Cardiovascular: Reg (paced), no M noted Lungs:  Clear anteriorly Abdomen: Soft, +BS Ext: no edema, warm  LABS: I have reviewed all of today's lab results. Relevant abnormalities are discussed in the A/P section  CXR: CM, no  overt edema  ASSESSMENT / PLAN:  PULMONARY ETT 8/15 >>   A: Acute resp failure after cardiac arrest P:   Cont full vent support - settings reviewed and/or adjusted Cont vent bundle Daily SBT if/when meets criteria  CARDIOVASCULAR A:  Bradycardic arrest due to severe hyperkalemia, dig, carvedilol NICM H/O AF P:  Further eval and mgmt per Cards Holding edoxaban  RENAL A:   AKI, nonoliguric CKD - baseline Cr 1.38 Severe hyperkalemia due to AKI, meds (ACEI, spironolactone, Kdur) P:   Monitor BMET frequently X 24 hrs Kayexalate 60 gm q 4 hrs X 2 HCO3 gtt Monitor I/Os Correct electrolytes as indicated  GASTROINTESTINAL  A:   No issues P:   SUP: IV famotidine Consider TFs in AM if not extubated  HEMATOLOGIC A:   No issues P:  DVT px: SQ heparin Monitor CBC intermittently Transfuse per usual ICU guidelines  INFECTIOUS A:  No issues P:   Monitor  ENDOCRINE A:  No issues P:   Monitor glu on chem panels Consider SSI for glu > 180  NEUROLOGIC A:   Acute enceph post arrest - doubt significant hypoxic injury P:   RASS goal: -2 Fent/midaz gtt Daily WUA  TODAY'S SUMMARY:   I have personally obtained a history, examined the patient, evaluated laboratory and imaging results, formulated the assessment and plan and placed orders. CRITICAL CARE: The patient is critically ill with multiple organ systems failure and requires high complexity decision making for assessment and support, frequent evaluation and titration of therapies, application of advanced monitoring technologies and extensive interpretation of multiple databases. Critical Care Time devoted to patient care services described in this note is 40 minutes.   Merton Border, MD ; [facility] [phone].  After 5:30 PM or weekends, call [phone]  Pulmonary and [place] [place] Pager: [phone]  09/30/2013, 6:26 PM

## 2013-09-30 NOTE — H&P (Signed)
Referring Physician: Dixie Dials, MD  [name] is an 63 y.o. male.                       Chief Complaint: Collapsed with severe bradycardia.  HPI: 63 year old male was at funeral home, collapsed, was diaphoretic, HR 20 when EMS arrived. No radial pulses, HR 20 carotid. Was pacing at HR 80, 80 milliamps. Lost capture, when arrived to ER, paced again at HR 85, 90 milliamps. Given 2.5 versed, 150 fentanyl. Pt was talking.  He was electively intubated, sedated and was taken to cath lab emergently. Hehad cardiac cath (No significant CAD) and temporary pacemaker, placed in RV apex at 55 cm length from groin and paced at 80 with 5 mA ( threshold 1 mA ).  Family gave information about patient drinking off and on.  His Potassium was high at 7.3 for which he got IV fluids and 50 meq. NaHCO3.     Past Medical History  Diagnosis Date  . Hypertension   . GERD (gastroesophageal reflux disease)   . Hiatal hernia   . CHF (congestive heart failure)   . Cardiomyopathy     nonischemic   . COPD (chronic obstructive pulmonary disease)     previously listed in DC summary from Feb 2012   . Chronic kidney disease (CKD), stage II (mild)     listed in chart although patient denies   . Atrial fibrillation      previously listed in DC summary from August 31, 2011, ? atrial tachycardia   . SVT (supraventricular tachycardia)   . Heart murmur     "dx'd in middle school" (12/22/2012)  . Pneumonia     "once when I was a baby; probably 3-4 times since then" (12/22/2012)  . Shortness of breath     "all the time last couple months" (12/22/2012)  . KAJGOTLX(726.2)     "weekly" (01/01/2013)  . Arthritis     "neck" (12/22/2012)  . Gout   . Depression       Past Surgical History  Procedure Laterality Date  . Tee without cardioversion N/A 05/11/2012    Procedure: TRANSESOPHAGEAL ECHOCARDIOGRAM (TEE);  Surgeon: Birdie Riddle, MD;  Location: [facility] ENDOSCOPY;  Service: Cardiovascular;  Laterality: N/A;  . Cardioversion  N/A 05/11/2012    Procedure: Electrocardioversion, external;  Surgeon: Birdie Riddle, MD;  Location: [place];  Service: Cardiovascular;  Laterality: N/A;  . Cardioversion N/A 05/11/2012    Procedure: CARDIOVERSION AT THE BEDSIDE;  Surgeon: Birdie Riddle, MD;  Location: [place];  Service: Cardiovascular;  Laterality: N/A;  . Supraventricular tachycardia ablation  08/2011; 09/2011  . Cardiac catheterization  08/2011    Family History  Problem Relation Age of Onset  . Lung cancer Mother     passed away from lung cancer  . Pneumonia Father     passed sway from PNA.  . Stroke Sister   . Diabetes Sister   . Seizures Brother   . Seizures Brother    Social History:  reports that he quit smoking about 2 years ago. His smoking use included Cigarettes. He has a 3.75 pack-year smoking history. He has never used smokeless tobacco. He reports that he drinks about 3.6 ounces of alcohol per week. He reports that he uses illicit drugs (Cocaine and Marijuana).  Allergies: No Known Allergies  Medications Prior to Admission  Medication Sig Dispense Refill  . allopurinol (ZYLOPRIM) 100 MG tablet Take 100 mg by mouth  daily.       . carvedilol (COREG) 6.25 MG tablet Take 6.25 mg by mouth 2 (two) times daily with a meal.      . colchicine 0.6 MG tablet Take 0.6 mg by mouth 3 (three) times a week.       . digoxin (LANOXIN) 0.125 MG tablet Take 1 tablet (0.125 mg total) by mouth daily.  30 tablet  3  . diltiazem (CARDIZEM) 120 MG tablet Take 120 mg by mouth 2 (two) times daily.      . Edoxaban Tosylate (SAVAYSA) 30 MG TABS Take 30 mg by mouth daily.       . furosemide (LASIX) 40 MG tablet Take 40 mg by mouth daily.       Marland Kitchen lisinopril (PRINIVIL,ZESTRIL) 20 MG tablet Take 0.5 tablets (10 mg total) by mouth daily.      . Multiple Vitamin (MULTIVITAMIN WITH MINERALS) TABS tablet Take 1 tablet by mouth daily.      Marland Kitchen oxyCODONE (OXY IR/ROXICODONE) 5 MG immediate release tablet Take 5 mg by mouth 2 (two) times daily  as needed for moderate pain or severe pain.      . pantoprazole (PROTONIX) 40 MG tablet Take 40 mg by mouth daily as needed (for heartburn).       . potassium chloride SA (K-DUR,KLOR-CON) 20 MEQ tablet Take 10 mEq by mouth daily.       . rosuvastatin (CRESTOR) 20 MG tablet Take 10 mg by mouth every Monday, Wednesday, and Friday.      Marland Kitchen spironolactone (ALDACTONE) 12.5 mg TABS tablet Take 0.5 tablets (12.5 mg total) by mouth daily.  30 tablet  0    Results for orders placed during the hospital encounter of 09/30/13 (from the past 48 hour(s))  CBC WITH DIFFERENTIAL     Status: Abnormal   Collection Time    09/30/13  1:01 PM      Result Value Ref Range   WBC 7.9  4.0 - 10.5 K/uL   RBC 3.49 (*) 4.22 - 5.81 MIL/uL   Hemoglobin 11.9 (*) 13.0 - 17.0 g/dL   HCT 35.3 (*) 39.0 - 52.0 %   MCV 101.1 (*) 78.0 - 100.0 fL   MCH 34.1 (*) 26.0 - 34.0 pg   MCHC 33.7  30.0 - 36.0 g/dL   RDW 11.8  11.5 - 15.5 %   Platelets 204  150 - 400 K/uL   Neutrophils Relative % 70  43 - 77 %   Neutro Abs 5.5  1.7 - 7.7 K/uL   Lymphocytes Relative 21  12 - 46 %   Lymphs Abs 1.6  0.7 - 4.0 K/uL   Monocytes Relative 6  3 - 12 %   Monocytes Absolute 0.5  0.1 - 1.0 K/uL   Eosinophils Relative 3  0 - 5 %   Eosinophils Absolute 0.2  0.0 - 0.7 K/uL   Basophils Relative 1  0 - 1 %   Basophils Absolute 0.1  0.0 - 0.1 K/uL  COMPREHENSIVE METABOLIC PANEL     Status: Abnormal   Collection Time    09/30/13  1:01 PM      Result Value Ref Range   Sodium 131 (*) 137 - 147 mEq/L   Potassium 7.8 (*) 3.7 - 5.3 mEq/L   Comment: CRITICAL RESULT CALLED TO, READ BACK BY AND VERIFIED WITH:     WILSON,J RN 09/30/13 1410 WOOTEN,K   Chloride 97  96 - 112 mEq/L   CO2 20  19 -  32 mEq/L   Glucose, Bld 106 (*) 70 - 99 mg/dL   BUN 62 (*) 6 - 23 mg/dL   Creatinine, Ser 3.70 (*) 0.50 - 1.35 mg/dL   Calcium 9.6  8.4 - 10.5 mg/dL   Total Protein 7.1  6.0 - 8.3 g/dL   Albumin 3.8  3.5 - 5.2 g/dL   AST 54 (*) 0 - 37 U/L   Comment:  HEMOLYSIS AT THIS LEVEL MAY AFFECT RESULT   ALT 36  0 - 53 U/L   Alkaline Phosphatase 82  39 - 117 U/L   Total Bilirubin 0.6  0.3 - 1.2 mg/dL   GFR calc non Af Amer 16 (*) >90 mL/min   GFR calc Af Amer 19 (*) >90 mL/min   Comment: (NOTE)     The eGFR has been calculated using the CKD EPI equation.     This calculation has not been validated in all clinical situations.     eGFR's persistently <90 mL/min signify possible Chronic Kidney     Disease.   Anion gap 14  5 - 15  PROTIME-INR     Status: Abnormal   Collection Time    09/30/13  1:01 PM      Result Value Ref Range   Prothrombin Time 27.3 (*) 11.6 - 15.2 seconds   INR 2.53 (*) 0.00 - 1.49  I-STAT TROPOININ, ED     Status: Abnormal   Collection Time    09/30/13  1:07 PM      Result Value Ref Range   Troponin i, poc 0.12 (*) 0.00 - 0.08 ng/mL   Comment NOTIFIED PHYSICIAN     Comment 3            Comment: Due to the release kinetics of cTnI,     a negative result within the first hours     of the onset of symptoms does not rule out     myocardial infarction with certainty.     If myocardial infarction is still suspected,     repeat the test at appropriate intervals.  I-STAT CG4 LACTIC ACID, ED     Status: Abnormal   Collection Time    09/30/13  1:09 PM      Result Value Ref Range   Lactic Acid, Venous 2.37 (*) 0.5 - 2.2 mmol/L  I-STAT CHEM 8, ED     Status: Abnormal   Collection Time    09/30/13  1:10 PM      Result Value Ref Range   Sodium 128 (*) 137 - 147 mEq/L   Potassium 7.4 (*) 3.7 - 5.3 mEq/L   Chloride 106  96 - 112 mEq/L   BUN 58 (*) 6 - 23 mg/dL   Creatinine, Ser 3.80 (*) 0.50 - 1.35 mg/dL   Glucose, Bld 107 (*) 70 - 99 mg/dL   Calcium, Ion 1.17  1.13 - 1.30 mmol/L   TCO2 25  0 - 100 mmol/L   Hemoglobin 12.6 (*) 13.0 - 17.0 g/dL   HCT 37.0 (*) 39.0 - 52.0 %   Comment NOTIFIED PHYSICIAN     Dg Chest Portable 1 View  09/30/2013   CLINICAL DATA:  Bradycardia, intubation, hypertension, CHF, cardiomyopathy,  COPD, GERD  EXAM: PORTABLE CHEST - 1 VIEW  COMPARISON:  Portable exam 1307 hr compared to 08/22/2013  Correlation: CT abdomen and pelvis 08/10/2011  FINDINGS: Tip of endotracheal tube projects 4.1 cm above carina.  EKG leads and external pacing leads project over chest.  Mild enlargement  of cardiac silhouette with pulmonary vascular congestion.  Calcified tortuous thoracic aorta.  Numerous tiny nodular foci project over the lungs, increased in conspicuity versus prior study.  On prior CT multiple tiny calcified nodular foci are seen at the lung bases, findings overall suggesting old granulomatous disease.  No acute infiltrate, pleural effusion or pneumothorax.  Osseous structures unremarkable.  IMPRESSION: Enlargement of cardiac silhouette with pulmonary vascular congestion.  Old granulomatous disease.  Satisfactory endotracheal tube position.   Electronically Signed   By: Lavonia Dana M.D.   On: 09/30/2013 13:51    Review Of Systems Constitutional: Negative for fever and chills.  HENT: Negative for neck pain and neck stiffness.  Eyes: Negative for pain.  Respiratory: Positive for shortness of breath.  Cardiovascular: Positive for chest pain.  Gastrointestinal: Negative for abdominal pain.  Genitourinary: Negative for dysuria.  Musculoskeletal: Positive for back pain. No leg edema  Skin: Negative for rash.  Neurological: Positive for headaches.   Blood pressure 172/139, pulse 91, resp. rate 28, height 5' 7"  (1.702 m).  HENT: Head: Normocephalic and atraumatic. Eyes: Brown eyes, Conjunctivae and EOM are normal. Pupils are equal, round, and reactive to light.  Neck: Neck supple. Normal carotid pulses and No JVD present. Cardiovascular: S1 normal, S2 normal and irregular rhythm. II/VI systolic murmur at left sternal border. Pulmonary/Chest: Lungs- basal fine crackles, bilateral to auscultation.   Abdominal: Bowel sounds are normal. Soft. Musculoskeletal: Normal range of motion. No  edema. Neurological: He is sedated. Some posturing of right arm. Skin: Skin is warm, dry and intact. No rash noted. No cyanosis. + clubbing.    Assessment/Plan Syncope Acute on chronic left heart systolic failure Acute respiratory failure Acute on chronic renal failure Severe bradycardia Severe hyperkalemia Dilated cardiomyopathy S/P SVT ablation x 2. Alcohol use disorder H/O atrial flutter Possible digitalis toxicity Possible dehydration   Admit to CCU Hold right groin catheter removal for 24 hours without Edoxaban (Savaysa). Check ABG and dig level and BMP. IV fluids as tolerated.  Birdie Riddle, MD  09/30/2013, 2:16 PM

## 2013-09-30 NOTE — ED Notes (Signed)
PACING AT 32mA, 90 hr.

## 2013-09-30 NOTE — Progress Notes (Signed)
eLink Physician-Brief Progress Note Patient Name: [name] DOB: 31-Jul-1950 MRN: [id number]   Date of Service  09/30/2013  HPI/Events of Note   Recent Labs Lab 09/30/13 1301 09/30/13 1310 09/30/13 1600  K 7.8* 7.4* 7.3*    Recent Labs Lab 09/30/13 1301 09/30/13 1310 09/30/13 1600  CREATININE 3.70* 3.80* 3.16*    Intake/Output     08/14 0701 - 08/15 0700 08/15 0701 - 08/16 0700   Urine (mL/kg/hr)  525   Total Output   525   Net   -525           eICU Interventions  k slight downtrend Making urine per rN  D/w dr simonds bedside MD Continue kayexalate Continue bicarb Give lasix x 1 Give calcium gluconate x 1  If no response to above, call renal     Intervention Category Intermediate Interventions: Electrolyte abnormality - evaluation and management  Chritopher Coster 09/30/2013, 5:33 PM

## 2013-09-30 NOTE — ED Notes (Signed)
Per EMS- Pt was at [employer] home, collapsed, was diaphoretic, HR 20 when EMS arrived. No radial pulses, HR 20 carotid. Was pacing at HR 80, 80 milliamps. Lost capture, when arrived, obtained again at HR 85, 90 milliamps. Given 2.5 versed, 150 fentanyl. Pt is talking.

## 2013-09-30 NOTE — ED Provider Notes (Signed)
CSN: [id number]     Arrival date & time 09/30/13  1241 History   None    Chief Complaint  Patient presents with  . Bradycardia     (Consider location/radiation/quality/duration/timing/severity/associated sxs/prior Treatment) HPI  This is a 63 year old male with history of cardiomyopathy, COPD, atrial fibrillation, SVT who presents by EMS following a syncopal episode and bradycardia in the 20s. Per EMS report, patient was at a funeral when he collapsed. He is noted to be diaphoretic and pale. On EMS arrival he had a heart rate of 20 and no radial pulses. He was paced by external pacing at a rate of ED capturing at 80 milliamps.  Patient was given 2 Versed 150 fentanyl in route.  He is currently conversing. He denies any chest pain, shortness of breath. He does state that he hurts when the pacer pads capture.  EMS was unable to perform underlying EKG. He was called in as a code STEMI.  Level V caveat for acuity of condition.  Patient's wife is at the bedside.  He is full code. Wife reports over the last several days he has had several episodes where he has become weak and appeared to pass out.  Past Medical History  Diagnosis Date  . Hypertension   . GERD (gastroesophageal reflux disease)   . Hiatal hernia   . CHF (congestive heart failure)   . Cardiomyopathy     nonischemic   . COPD (chronic obstructive pulmonary disease)     previously listed in DC summary from Feb 2012   . Chronic kidney disease (CKD), stage II (mild)     listed in chart although patient denies   . Atrial fibrillation      previously listed in DC summary from August 31, 2011, ? atrial tachycardia   . SVT (supraventricular tachycardia)   . Heart murmur     "dx'd in middle school" (12/22/2012)  . Pneumonia     "once when I was a baby; probably 3-4 times since then" (12/22/2012)  . Shortness of breath     "all the time last couple months" (12/22/2012)  . GDJMEQAS(341.9)     "weekly" (01/01/2013)  . Arthritis      "neck" (12/22/2012)  . Gout   . Depression    Past Surgical History  Procedure Laterality Date  . Tee without cardioversion N/A 05/11/2012    Procedure: TRANSESOPHAGEAL ECHOCARDIOGRAM (TEE);  Surgeon: Birdie Riddle, MD;  Location: [facility] ENDOSCOPY;  Service: Cardiovascular;  Laterality: N/A;  . Cardioversion N/A 05/11/2012    Procedure: Electrocardioversion, external;  Surgeon: Birdie Riddle, MD;  Location: [place];  Service: Cardiovascular;  Laterality: N/A;  . Cardioversion N/A 05/11/2012    Procedure: CARDIOVERSION AT THE BEDSIDE;  Surgeon: Birdie Riddle, MD;  Location: [place];  Service: Cardiovascular;  Laterality: N/A;  . Supraventricular tachycardia ablation  08/2011; 09/2011  . Cardiac catheterization  08/2011   Family History  Problem Relation Age of Onset  . Lung cancer Mother     passed away from lung cancer  . Pneumonia Father     passed sway from PNA.  . Stroke Sister   . Diabetes Sister   . Seizures Brother   . Seizures Brother    History  Substance Use Topics  . Smoking status: Former Smoker -- 0.25 packs/day for 15 years    Types: Cigarettes    Quit date: 07/17/2011  . Smokeless tobacco: Never Used  . Alcohol Use: 3.6 oz/week    6 Cans of  beer per week     Comment: 12/22/2012 1, ~ 24oz beer q other day at most"    Review of Systems  Unable to perform ROS: Acuity of condition      Allergies  Review of patient's allergies indicates no known allergies.  Home Medications   Prior to Admission medications   Medication Sig Start Date End Date Taking? Authorizing Provider  allopurinol (ZYLOPRIM) 100 MG tablet Take 100 mg by mouth daily.  07/21/13  Yes Historical Provider, MD  carvedilol (COREG) 6.25 MG tablet Take 6.25 mg by mouth 2 (two) times daily with a meal.   Yes Historical Provider, MD  colchicine 0.6 MG tablet Take 0.6 mg by mouth 3 (three) times a week.    Yes Historical Provider, MD  digoxin (LANOXIN) 0.125 MG tablet Take 1 tablet (0.125 mg total) by  mouth daily. 03/29/13  Yes Birdie Riddle, MD  diltiazem (CARDIZEM) 120 MG tablet Take 120 mg by mouth 2 (two) times daily.   Yes Historical Provider, MD  Edoxaban Tosylate (SAVAYSA) 30 MG TABS Take 30 mg by mouth daily.    Yes Historical Provider, MD  furosemide (LASIX) 40 MG tablet Take 40 mg by mouth daily.    Yes Historical Provider, MD  lisinopril (PRINIVIL,ZESTRIL) 20 MG tablet Take 0.5 tablets (10 mg total) by mouth daily. 03/29/13  Yes Birdie Riddle, MD  Multiple Vitamin (MULTIVITAMIN WITH MINERALS) TABS tablet Take 1 tablet by mouth daily.   Yes Historical Provider, MD  oxyCODONE (OXY IR/ROXICODONE) 5 MG immediate release tablet Take 5 mg by mouth 2 (two) times daily as needed for moderate pain or severe pain.   Yes Historical Provider, MD  pantoprazole (PROTONIX) 40 MG tablet Take 40 mg by mouth daily as needed (for heartburn).    Yes Historical Provider, MD  potassium chloride SA (K-DUR,KLOR-CON) 20 MEQ tablet Take 10 mEq by mouth daily.    Yes Historical Provider, MD  rosuvastatin (CRESTOR) 20 MG tablet Take 10 mg by mouth every Monday, Wednesday, and Friday.   Yes Historical Provider, MD  spironolactone (ALDACTONE) 12.5 mg TABS tablet Take 0.5 tablets (12.5 mg total) by mouth daily. 08/24/13  Yes Charlynne Cousins, MD   BP 172/139  Pulse 91  Resp 28  Ht 5\' 7"  (1.702 m) Physical Exam  Nursing note and vitals reviewed. Constitutional: He is oriented to person, place, and time.  Elderly, waxing and waning level of consciousness  HENT:  Head: Normocephalic and atraumatic.  Mouth/Throat: Oropharynx is clear and moist.  Eyes: Pupils are equal, round, and reactive to light.  Pupils 2 mm reactive bilaterally  Cardiovascular:  Paced rhythm 2+ femoral pulse with capture  Pulmonary/Chest: Effort normal and breath sounds normal. No respiratory distress. He has no wheezes.  Abdominal: Soft. Bowel sounds are normal. There is no tenderness. There is no rebound.  Musculoskeletal: He  exhibits no edema.  Neurological: He is alert and oriented to person, place, and time.  Waning level of consciousness  Skin: Skin is warm and dry.    ED Course  Procedures (including critical care time)  CRITICAL CARE Performed by: Thayer Jew, F   Total critical care time: 60 min  Critical care time was exclusive of separately billable procedures and treating other patients.  Critical care was necessary to treat or prevent imminent or life-threatening deterioration.  Critical care was time spent personally by me on the following activities: development of treatment plan with patient and/or surrogate as well as nursing, discussions  with consultants, evaluation of patient's response to treatment, examination of patient, obtaining history from patient or surrogate, ordering and performing treatments and interventions, ordering and review of laboratory studies, ordering and review of radiographic studies, pulse oximetry and re-evaluation of patient's condition.  INTUBATION Performed by: Thayer Jew, F  Required items: required blood products, implants, devices, and special equipment available Patient identity confirmed: provided demographic data and hospital-assigned identification number Time out: Immediately prior to procedure a "time out" was called to verify the correct patient, procedure, equipment, support staff and site/side marked as required.  Indications: AMS, projected course  Intubation method: Glidescope Laryngoscopy   Preoxygenation: BVM  Sedatives: Etomidate Paralytic: Succinylcholine  Tube Size: 7.5 cuffed  Post-procedure assessment: chest rise and ETCO2 monitor Breath sounds: equal and absent over the epigastrium Tube secured with: ETT holder Chest x-ray interpreted by radiologist and me.  Chest x-ray findings: endotracheal tube in appropriate position  Patient tolerated the procedure well with no immediate complications.    Labs Review Labs  Reviewed  PROTIME-INR - Abnormal; Notable for the following:    Prothrombin Time 27.3 (*)    INR 2.53 (*)    All other components within normal limits  I-STAT CG4 LACTIC ACID, ED - Abnormal; Notable for the following:    Lactic Acid, Venous 2.37 (*)    All other components within normal limits  I-STAT TROPOININ, ED - Abnormal; Notable for the following:    Troponin i, poc 0.12 (*)    All other components within normal limits  I-STAT CHEM 8, ED - Abnormal; Notable for the following:    Sodium 128 (*)    Potassium 7.4 (*)    BUN 58 (*)    Creatinine, Ser 3.80 (*)    Glucose, Bld 107 (*)    Hemoglobin 12.6 (*)    HCT 37.0 (*)    All other components within normal limits  CBC WITH DIFFERENTIAL  COMPREHENSIVE METABOLIC PANEL  PRO B NATRIURETIC PEPTIDE  DIGOXIN LEVEL    Imaging Review Dg Chest Portable 1 View  09/30/2013   CLINICAL DATA:  Bradycardia, intubation, hypertension, CHF, cardiomyopathy, COPD, GERD  EXAM: PORTABLE CHEST - 1 VIEW  COMPARISON:  Portable exam 1307 hr compared to 08/22/2013  Correlation: CT abdomen and pelvis 08/10/2011  FINDINGS: Tip of endotracheal tube projects 4.1 cm above carina.  EKG leads and external pacing leads project over chest.  Mild enlargement of cardiac silhouette with pulmonary vascular congestion.  Calcified tortuous thoracic aorta.  Numerous tiny nodular foci project over the lungs, increased in conspicuity versus prior study.  On prior CT multiple tiny calcified nodular foci are seen at the lung bases, findings overall suggesting old granulomatous disease.  No acute infiltrate, pleural effusion or pneumothorax.  Osseous structures unremarkable.  IMPRESSION: Enlargement of cardiac silhouette with pulmonary vascular congestion.  Old granulomatous disease.  Satisfactory endotracheal tube position.   Electronically Signed   By: Lavonia Dana M.D.   On: 09/30/2013 13:51     EKG Interpretation None      MDM   Final diagnoses:  Symptomatic  bradycardia  Paced rhythm on cardiac monitor  Hyperkalemia  Acute on chronic kidney failure    Patient presents following a syncopal episode. Found to be in sinus bradycardia with a rate in the 20s. Has been paced by EMS. He is alert at this time. His level of consciousness does wax and wane. Patient was transitioned to our  pacer pads. No underlying rhythm can be obtained at this time.  Patient was given 0.5 mg of atropine with improvement of pulses. [name] is his primary cardiologist and Dr. Angelena Form are both at the bedside. Will continue to pace. Patient will likely need pacemaker wire. Decision was made to intubate patient given waxing and waning level of consciousness. Prior lab work was reviewed with a normal potassium. Patient was intubated with standard RSI medications including etomidate and succinylcholine.  Following intubation, chem 8 returned with acute increasing creatinine of 4.0 and a potassium of 7.4.  Verbal orders for insulin, glucose, and calcium were given. Patient did not receive these prior to transfer. Patient emergently to Cath Lab under the care of [name].    Merryl Hacker, MD 09/30/13 [phone]

## 2013-09-30 NOTE — ED Notes (Signed)
DR. Dionne Ano OBTAINED FEMORAL STICK FOR LAB DRAW. EMT HOLDING PRESSURE.

## 2013-09-30 NOTE — ED Notes (Addendum)
PT GOING TO CATH LAB. MADE AWARE DID NOT RECEIVE CALCIUM GLUCONATE. PHARMACY MADE AWARE PT GOING TO CATH LAB.

## 2013-10-01 ENCOUNTER — Inpatient Hospital Stay (HOSPITAL_COMMUNITY): Payer: Medicare HMO

## 2013-10-01 DIAGNOSIS — I469 Cardiac arrest, cause unspecified: Secondary | ICD-10-CM

## 2013-10-01 DIAGNOSIS — I428 Other cardiomyopathies: Secondary | ICD-10-CM

## 2013-10-01 LAB — BASIC METABOLIC PANEL
Anion gap: 20 — ABNORMAL HIGH (ref 5–15)
BUN: 57 mg/dL — AB (ref 6–23)
CHLORIDE: 102 meq/L (ref 96–112)
CO2: 17 mEq/L — ABNORMAL LOW (ref 19–32)
Calcium: 9.2 mg/dL (ref 8.4–10.5)
Creatinine, Ser: 2.82 mg/dL — ABNORMAL HIGH (ref 0.50–1.35)
GFR calc Af Amer: 26 mL/min — ABNORMAL LOW (ref >90)
GFR, EST NON AFRICAN AMERICAN: 22 mL/min — AB (ref >90)
GLUCOSE: 135 mg/dL — AB (ref 70–99)
POTASSIUM: 3.8 meq/L (ref 3.7–5.3)
Sodium: 139 mEq/L (ref 137–147)

## 2013-10-01 LAB — GLUCOSE, CAPILLARY
Glucose-Capillary: 110 mg/dL — ABNORMAL HIGH (ref 70–99)
Glucose-Capillary: 114 mg/dL — ABNORMAL HIGH (ref 70–99)
Glucose-Capillary: 123 mg/dL — ABNORMAL HIGH (ref 70–99)
Glucose-Capillary: 128 mg/dL — ABNORMAL HIGH (ref 70–99)
Glucose-Capillary: 170 mg/dL — ABNORMAL HIGH (ref 70–99)

## 2013-10-01 LAB — PRO B NATRIURETIC PEPTIDE: Pro B Natriuretic peptide (BNP): 2028 pg/mL — ABNORMAL HIGH (ref 0–125)

## 2013-10-01 LAB — APTT: aPTT: 33 seconds (ref 24–37)

## 2013-10-01 LAB — PROTIME-INR
INR: 1.56 — ABNORMAL HIGH (ref 0.00–1.49)
Prothrombin Time: 18.7 seconds — ABNORMAL HIGH (ref 11.6–15.2)

## 2013-10-01 LAB — CBC
HCT: 32.1 % — ABNORMAL LOW (ref 39.0–52.0)
Hemoglobin: 11.4 g/dL — ABNORMAL LOW (ref 13.0–17.0)
MCH: 33.7 pg (ref 26.0–34.0)
MCHC: 35.5 g/dL (ref 30.0–36.0)
MCV: 95 fL (ref 78.0–100.0)
Platelets: 184 10*3/uL (ref 150–400)
RBC: 3.38 MIL/uL — ABNORMAL LOW (ref 4.22–5.81)
RDW: 11.4 % — AB (ref 11.5–15.5)
WBC: 5.8 10*3/uL (ref 4.0–10.5)

## 2013-10-01 MED ORDER — SODIUM CHLORIDE 0.45 % IV SOLN
INTRAVENOUS | Status: DC
  Administered 2013-10-01 – 2013-10-05 (×4): via INTRAVENOUS

## 2013-10-01 MED ORDER — SODIUM CHLORIDE 0.45 % IV SOLN: Freq: Once | INTRAVENOUS | Status: DC

## 2013-10-01 MED ORDER — METOPROLOL TARTRATE 1 MG/ML IV SOLN
5.0000 mg | Freq: Once | INTRAVENOUS | Status: AC
  Administered 2013-10-01: 5 mg via INTRAVENOUS

## 2013-10-01 MED ORDER — METOPROLOL TARTRATE 1 MG/ML IV SOLN
INTRAVENOUS | Status: AC
  Filled 2013-10-01: qty 5

## 2013-10-01 MED ORDER — METOPROLOL TARTRATE 1 MG/ML IV SOLN
2.5000 mg | Freq: Once | INTRAVENOUS | Status: AC
  Administered 2013-10-01: 2.5 mg via INTRAVENOUS
  Filled 2013-10-01: qty 5

## 2013-10-01 MED ORDER — EDOXABAN TOSYLATE 30 MG PO TABS
30.0000 mg | ORAL_TABLET | ORAL | Status: DC
  Administered 2013-10-02 – 2013-10-07 (×5): 30 mg via ORAL
  Filled 2013-10-01 (×6): qty 1

## 2013-10-01 MED ORDER — FENTANYL CITRATE 0.05 MG/ML IJ SOLN
12.5000 ug | INTRAMUSCULAR | Status: DC | PRN
  Administered 2013-10-02: 12.5 ug via INTRAVENOUS
  Administered 2013-10-02 – 2013-10-03 (×2): 25 ug via INTRAVENOUS
  Filled 2013-10-01 (×3): qty 2

## 2013-10-01 MED ORDER — LORAZEPAM 2 MG/ML IJ SOLN
0.5000 mg | INTRAMUSCULAR | Status: DC | PRN
  Administered 2013-10-05: 1 mg via INTRAVENOUS
  Filled 2013-10-01: qty 1

## 2013-10-01 MED ORDER — INSULIN ASPART 100 UNIT/ML ~~LOC~~ SOLN
0.0000 [IU] | SUBCUTANEOUS | Status: DC
  Administered 2013-10-01: 3 [IU] via SUBCUTANEOUS

## 2013-10-01 MED ORDER — ATROPINE SULFATE 0.1 MG/ML IJ SOLN
INTRAMUSCULAR | Status: AC
  Filled 2013-10-01: qty 10

## 2013-10-01 MED ORDER — METOPROLOL TARTRATE 1 MG/ML IV SOLN
5.0000 mg | Freq: Three times a day (TID) | INTRAVENOUS | Status: DC
  Administered 2013-10-01 – 2013-10-02 (×2): 5 mg via INTRAVENOUS
  Filled 2013-10-01 (×5): qty 5

## 2013-10-01 MED FILL — Medication: Qty: 1 | Status: AC

## 2013-10-01 NOTE — Procedures (Signed)
Extubation Procedure Note  Patient Details:   Name: [name] DOB: 24-Feb-1950 MRN: [id number]   Airway Documentation:     Evaluation  O2 sats: stable throughout Complications: No apparent complications Patient did tolerate procedure well. Bilateral Breath Sounds: Clear;Diminished Suctioning: Airway Yes. Extubated per Dr. Alva Garnet orders to 2L [place].    Halston Kintz V 10/01/2013, 12:26 PM

## 2013-10-01 NOTE — Progress Notes (Signed)
Ref: [name]   Subjective:  Intubated and sedated. Down to 30 % oxygen. Afebrile. Not dependent on temporary pacemaker.  Monitor showing slow atrial flutter with 2:1 AV block.  Normal digoxin level.  Objective:  Vital Signs in the last 24 hours: Temp:  [97.6 F (36.4 C)-98.4 F (36.9 C)] 98.4 F (36.9 C) (08/16 0800) Pulse Rate:  [31-117] 103 (08/16 0800) Cardiac Rhythm:  [-] Ventricular paced (08/15 2000) Resp:  [10-28] 16 (08/16 0800) BP: (89-175)/(54-139) 127/98 mmHg (08/16 0800) SpO2:  [100 %] 100 % (08/16 0800) Arterial Line BP: (91-147)/(53-95) 147/95 mmHg (08/16 0700) FiO2 (%):  [30 %-100 %] 30 % (08/16 0726) Weight:  [63.4 kg (139 lb 12.4 oz)] 63.4 kg (139 lb 12.4 oz) (08/15 1500)  Physical Exam: BP Readings from Last 1 Encounters:  10/01/13 127/98    Wt Readings from Last 1 Encounters:  09/30/13 63.4 kg (139 lb 12.4 oz)    Weight change:   HEENT: [place]/AT, Eyes-Brown, Conjunctiva-Pink, Sclera-Non-icteric Neck: No JVD, No bruit, Trachea midline. Lungs:  Clearing, Bilateral. Cardiac: Tachycardic, Regular rhythm, normal S1 and S2, no S3. II/VI systolic murmur. Abdomen:  Soft, non-tender. Extremities:  No edema present. No cyanosis. + clubbing. CNS: AxOx3, Cranial nerves grossly intact, moves all 4 extremities. Right handed. Skin: Warm and dry.   Intake/Output from previous day: 08/15 0701 - 08/16 0700 In: 2136.1 [I.V.:1966.1; NG/GT:60; IV Piggyback:110] Out: 1875 [Urine:1875]    Lab Results: BMET    Component Value Date/Time   NA 139 10/01/2013 0330   NA 136* 09/30/2013 2251   NA 130* 09/30/2013 1600   K 3.8 10/01/2013 0330   K 4.3 09/30/2013 2251   K 7.3* 09/30/2013 1600   CL 102 10/01/2013 0330   CL 98 09/30/2013 2251   CL 97 09/30/2013 1600   CO2 17* 10/01/2013 0330   CO2 20 09/30/2013 2251   CO2 16* 09/30/2013 1600   GLUCOSE 135* 10/01/2013 0330   GLUCOSE 153* 09/30/2013 2251   GLUCOSE 94 09/30/2013 1600   BUN 57* 10/01/2013 0330   BUN 59*  09/30/2013 2251   BUN 62* 09/30/2013 1600   CREATININE 2.82* 10/01/2013 0330   CREATININE 2.84* 09/30/2013 2251   CREATININE 3.16* 09/30/2013 1600   CALCIUM 9.2 10/01/2013 0330   CALCIUM 9.7 09/30/2013 2251   CALCIUM 9.6 09/30/2013 1600   GFRNONAA 22* 10/01/2013 0330   GFRNONAA 22* 09/30/2013 2251   GFRNONAA 19* 09/30/2013 1600   GFRAA 26* 10/01/2013 0330   GFRAA 26* 09/30/2013 2251   GFRAA 23* 09/30/2013 1600   CBC    Component Value Date/Time   WBC 5.8 10/01/2013 0333   RBC 3.38* 10/01/2013 0333   HGB 11.4* 10/01/2013 0333   HCT 32.1* 10/01/2013 0333   PLT 184 10/01/2013 0333   MCV 95.0 10/01/2013 0333   MCH 33.7 10/01/2013 0333   MCHC 35.5 10/01/2013 0333   RDW 11.4* 10/01/2013 0333   LYMPHSABS 1.6 09/30/2013 1301   MONOABS 0.5 09/30/2013 1301   EOSABS 0.2 09/30/2013 1301   BASOSABS 0.1 09/30/2013 1301   HEPATIC Function Panel  Recent Labs  08/23/13 0118 08/24/13 0305 09/30/13 1301  PROT 6.9 6.1 7.1   HEMOGLOBIN A1C No components found with this basename: HGA1C,  MPG   CARDIAC ENZYMES Lab Results  Component Value Date   CKTOTAL 65 09/08/2011   CKMB 3.5 09/08/2011   TROPONINI <0.30 08/23/2013   TROPONINI <0.30 08/23/2013   TROPONINI <0.30 08/23/2013   BNP  Recent Labs  08/22/13 2223 09/30/13 1301 10/01/13 0330  PROBNP 1416.0* 1528.0* 2028.0*   TSH  Recent Labs  08/23/13 0118  TSH 1.440   CHOLESTEROL No results found for this basename: CHOL,  in the last 8760 hours  Scheduled Meds: . antiseptic oral rinse  7 mL Mouth Rinse QID  . chlorhexidine  15 mL Mouth Rinse BID  . famotidine  20 mg Per Tube Daily  . insulin aspart  0-9 Units Subcutaneous 6 times per day  . pneumococcal 23 valent vaccine  0.5 mL Intramuscular Tomorrow-1000   Continuous Infusions: . dextrose 5 % and 0.9% NaCl 100 mL/hr at 09/30/13 2000  . fentaNYL infusion INTRAVENOUS 50 mcg/hr (09/30/13 2000)  . midazolam (VERSED) infusion 4 mg/hr (10/01/13 0751)   PRN Meds:.acetaminophen, ondansetron (ZOFRAN)  IV  Assessment/Plan: Syncope  Acute on chronic left heart systolic failure  Acute respiratory failure  Acute on chronic renal failure  Severe bradycardia -resolved Severe hyperkalemia -resolved Dilated cardiomyopathy  S/P SVT ablation x 2.  Alcohol use disorder  H/O atrial flutter  Digitalis toxicity ruled out Dehydration  Weaning off ventilator per CCM. Will discontinue Pacemaker and right groin catheters.    LOS: 1 day    Dixie Dials  MD  10/01/2013, 8:20 AM

## 2013-10-01 NOTE — Progress Notes (Signed)
Prior to and post extubation, pt's heart rhythm was in and out of a fast rhythm, reading a-flutter with AV block & PVCs on EKG.  MD paged and orders received.  Will continue to monitor closely and update as needed.

## 2013-10-01 NOTE — Progress Notes (Signed)
D/C foley at 12:00, condom catheter placed. Only 20 cc out. Bladder scan performed, ~200 urine in bladder. MD Doylene Canard ordered 250 ml bolus .45% NS over 2 hours.

## 2013-10-01 NOTE — Progress Notes (Signed)
Utilization Review Completed.[name] T8/16/2015  

## 2013-10-01 NOTE — Progress Notes (Signed)
Pharmacist Heart Failure Core Measure Documentation  Assessment: [name] has an EF documented as 35-40% on 08/23/13 by ECHO.  Rationale: Heart failure patients with left ventricular systolic dysfunction (LVSD) and an EF < 40% should be prescribed an angiotensin converting enzyme inhibitor (ACEI) or angiotensin receptor blocker (ARB) at discharge unless a contraindication is documented in the medical record.  This patient is not currently on an ACEI or ARB for HF.  This note is being placed in the record in order to provide documentation that a contraindication to the use of these agents is present for this encounter.  ACE Inhibitor or Angiotensin Receptor Blocker is contraindicated (specify all that apply)  []   ACEI allergy AND ARB allergy []   Angioedema []   Moderate or severe aortic stenosis []   Hyperkalemia [x]   Hypotension []   Renal artery stenosis [x]   Worsening renal function, preexisting renal disease or dysfunction   [name], BCPS  Clinical Pharmacist Pager [phone]  10/01/2013 2:22 PM

## 2013-10-01 NOTE — Progress Notes (Signed)
ANTICOAGULATION CONSULT NOTE - Initial Consult  Pharmacy Consult for Edoxaban [facility]) Indication: atrial fibrillation  No Known Allergies  Patient Measurements: Height: 5\' 9"  (175.3 cm) Weight: 139 lb 12.4 oz (63.4 kg) IBW/kg (Calculated) : 70.7 Heparin Dosing Weight: n/a  Vital Signs: Temp: 98.4 F (36.9 C) (08/16 0800) Temp src: Oral (08/16 0800) BP: 127/94 mmHg (08/16 0900) Pulse Rate: 102 (08/16 0900)  Labs:  Recent Labs  09/30/13 1301 09/30/13 1310 09/30/13 1600 09/30/13 2251 10/01/13 0330 10/01/13 0333  HGB 11.9* 12.6*  --   --   --  11.4*  HCT 35.3* 37.0*  --   --   --  32.1*  PLT 204  --   --   --   --  184  APTT  --   --   --   --   --  33  LABPROT 27.3*  --   --   --   --  18.7*  INR 2.53*  --   --   --   --  1.56*  CREATININE 3.70* 3.80* 3.16* 2.84* 2.82*  --     Estimated Creatinine Clearance: 24 ml/min (by C-G formula based on Cr of 2.82).   Medical History: Past Medical History  Diagnosis Date  . Hypertension   . GERD (gastroesophageal reflux disease)   . Hiatal hernia   . CHF (congestive heart failure)   . Cardiomyopathy     nonischemic   . COPD (chronic obstructive pulmonary disease)     previously listed in DC summary from Feb 2012   . Chronic kidney disease (CKD), stage II (mild)     listed in chart although patient denies   . Atrial fibrillation      previously listed in DC summary from August 31, 2011, ? atrial tachycardia   . SVT (supraventricular tachycardia)   . Heart murmur     "dx'd in middle school" (12/22/2012)  . Pneumonia     "once when I was a baby; probably 3-4 times since then" (12/22/2012)  . Shortness of breath     "all the time last couple months" (12/22/2012)  . JKDTOIZT(245.8)     "weekly" (01/01/2013)  . Arthritis     "neck" (12/22/2012)  . Gout   . Depression     Medications:  Prescriptions prior to admission  Medication Sig Dispense Refill  . allopurinol (ZYLOPRIM) 100 MG tablet Take 100 mg by mouth daily.        . carvedilol (COREG) 6.25 MG tablet Take 6.25 mg by mouth 2 (two) times daily with a meal.      . colchicine 0.6 MG tablet Take 0.6 mg by mouth 3 (three) times a week.       . digoxin (LANOXIN) 0.125 MG tablet Take 1 tablet (0.125 mg total) by mouth daily.  30 tablet  3  . diltiazem (CARDIZEM) 120 MG tablet Take 120 mg by mouth 2 (two) times daily.      . Edoxaban Tosylate (SAVAYSA) 30 MG TABS Take 30 mg by mouth daily.       . furosemide (LASIX) 40 MG tablet Take 40 mg by mouth daily.       Marland Kitchen lisinopril (PRINIVIL,ZESTRIL) 20 MG tablet Take 0.5 tablets (10 mg total) by mouth daily.      . Multiple Vitamin (MULTIVITAMIN WITH MINERALS) TABS tablet Take 1 tablet by mouth daily.      Marland Kitchen oxyCODONE (OXY IR/ROXICODONE) 5 MG immediate release tablet Take 5 mg by mouth 2 (two)  times daily as needed for moderate pain or severe pain.      . pantoprazole (PROTONIX) 40 MG tablet Take 40 mg by mouth daily as needed (for heartburn).       . potassium chloride SA (K-DUR,KLOR-CON) 20 MEQ tablet Take 10 mEq by mouth daily.       . rosuvastatin (CRESTOR) 20 MG tablet Take 10 mg by mouth every Monday, Wednesday, and Friday.      Marland Kitchen spironolactone (ALDACTONE) 12.5 mg TABS tablet Take 0.5 tablets (12.5 mg total) by mouth daily.  30 tablet  0    Assessment: 63 yo male on chronic anticoagulation for afib with edoxaban.  PTA was taking reduced dose of edoxaban 30 mg daily due to CrCl < 50.  His Scr is 2.82 and estimated CrCl ~ 24 ml/min.  Planning to remove pacemaker and groin sheath today.  Pharmacy asked to resume edoxaban tomorrow per CCM orders.  Planning extubation today.  Per [name] the patient has been obtaining this medication from samples at his office.  Goal of Therapy:  Therapeutic anticoagulation Monitor platelets by anticoagulation protocol: Yes   Plan:  1. Restart edoxaban 30 mg daily tomorrow.  2. Will educate prior to d/c.  [name], BCPS  Clinical Pharmacist Pager [phone]  10/01/2013 10:15 AM

## 2013-10-01 NOTE — Progress Notes (Signed)
Per MD order, okay to change fluids to D5 1/2 NS if CBG <100

## 2013-10-01 NOTE — Progress Notes (Signed)
PULMONARY / CRITICAL CARE MEDICINE   Name: [name] MRN: [id number] DOB: 1950-07-16    ADMISSION DATE:  09/30/2013 CONSULTATION DATE:    Loistine Chance MD :  Doylene Canard  INITIAL PRESENTATION:  H/O cardiomyopathy - EF 35-40% 08/23/13. Presented to ED with syncope due to bradycardic arrest. Intubated in ED. Taken to cath lab by Dr Doylene Canard for [facility] and temp pacer placement. Found to be profoundly hyperkalemic. 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 LVEF 25% 8/16 Hyperkalemia resolved. HCO3 gtt DC'd. Temp PM removed. Extubated    SUBJECTIVE:  RASS -2 to +2. + F/C. Tolerates PS 5 cm H2O   VITAL SIGNS: Temp:  [97.6 F (36.4 C)-98.4 F (36.9 C)] 98.4 F (36.9 C) (08/16 0800) Pulse Rate:  [31-117] 102 (08/16 0900) Resp:  [10-28] 10 (08/16 0900) BP: (89-175)/(54-139) 127/94 mmHg (08/16 0900) SpO2:  [100 %] 100 % (08/16 0900) Arterial Line BP: (91-147)/(53-95) 114/79 mmHg (08/16 0900) FiO2 (%):  [30 %-100 %] 30 % (08/16 0800) Weight:  [63.4 kg (139 lb 12.4 oz)] 63.4 kg (139 lb 12.4 oz) (08/15 1500) HEMODYNAMICS:   VENTILATOR SETTINGS: Vent Mode:  [-] PRVC FiO2 (%):  [30 %-100 %] 30 % Set Rate:  [15 bmp] 15 bmp Vt Set:  [500 mL-600 mL] 500 mL PEEP:  [5 cmH20] 5 cmH20 Plateau Pressure:  [15 cmH20-20 cmH20] 19 cmH20 INTAKE / OUTPUT:  Intake/Output Summary (Last 24 hours) at 10/01/13 0957 Last data filed at 10/01/13 0900  Gross per 24 hour  Intake 2354.08 ml  Output   2225 ml  Net 129.08 ml    PHYSICAL EXAMINATION: General:  NAD Neuro:  MAEs, CNs intact, no focal deficits HEENT: WNL Cardiovascular: Reg (paced), no M noted Lungs:  Clear anteriorly Abdomen: Soft, +BS Ext: no edema, warm  LABS: I have reviewed all of today's lab results. 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 H/O AF P:  Further eval and mgmt per Cards Holding edoxaban - resume 8/17 Temp PM to be removed today  PULMONARY ETT 8/15 >> 8/16  A: Acute resp failure after cardiac arrest P:   Extubate today PRN O2 to maintain SpO2 > 92%  RENAL A:   AKI, nonoliguric CKD - baseline Cr 1.38 Severe hyperkalemia due to AKI, meds (ACEI, spironolactone, Kdur) P:   Monitor I/Os Correct electrolytes as indicated  GASTROINTESTINAL A:   No issues P:   SUP: N/I post extubation Begin diet today if tolerating extubation  HEMATOLOGIC A:   No issues P:  DVT px: SCDs Monitor CBC intermittently Transfuse per usual ICU guidelines Edoxaban to be resumed 8/17  INFECTIOUS A:  No issues P:   Monitor  ENDOCRINE A:  No issues P:   Monitor glu on chem panels Consider SSI for glu > 180  NEUROLOGIC A:   Acute enceph post arrest, probably resolved H/O EtOH abuse P:   Monitor for W/D symptoms Low dose PRN lorazepam  TODAY'S SUMMARY:   I have personally obtained a history, examined the patient, evaluated laboratory and imaging results, formulated the assessment and plan and placed orders. CRITICAL CARE: The patient is critically ill with multiple organ systems failure and requires high complexity decision making for assessment and support, frequent evaluation and titration of therapies, application of advanced monitoring technologies  and extensive interpretation of multiple databases. Critical Care Time devoted to patient care services described in this note is 40 minutes.   Wife updated @ bedside   Merton Border, MD ; [facility] [phone].  After 5:30 PM or weekends, call [phone]  10/01/2013, 9:57 AM

## 2013-10-01 NOTE — Progress Notes (Signed)
R femoral arterial and venous temporary pacer removed from R fem.  Both sites at a level 0 prior to pulling sheaths, as well as throughout sheath pull and at end of sheath pull.   Pressure held for 30 minutes for arterial sheath starting at 1015 and ending at 1045.  R pedal pulses palpable throughout sheath pull.  Unable to educate patient due to being on ventilator and sedated.  Emotional support given to pt & wife at bedside.

## 2013-10-02 DIAGNOSIS — I5023 Acute on chronic systolic (congestive) heart failure: Secondary | ICD-10-CM

## 2013-10-02 DIAGNOSIS — I509 Heart failure, unspecified: Secondary | ICD-10-CM

## 2013-10-02 LAB — BASIC METABOLIC PANEL
ANION GAP: 12 (ref 5–15)
BUN: 37 mg/dL — AB (ref 6–23)
CALCIUM: 8.9 mg/dL (ref 8.4–10.5)
CHLORIDE: 105 meq/L (ref 96–112)
CO2: 22 mEq/L (ref 19–32)
CREATININE: 1.86 mg/dL — AB (ref 0.50–1.35)
GFR calc Af Amer: 43 mL/min — ABNORMAL LOW (ref >90)
GFR calc non Af Amer: 37 mL/min — ABNORMAL LOW (ref >90)
GLUCOSE: 104 mg/dL — AB (ref 70–99)
Potassium: 3.4 mEq/L — ABNORMAL LOW (ref 3.7–5.3)
Sodium: 139 mEq/L (ref 137–147)

## 2013-10-02 LAB — CBC
HEMATOCRIT: 35.3 % — AB (ref 39.0–52.0)
HEMOGLOBIN: 11.9 g/dL — AB (ref 13.0–17.0)
MCH: 33.3 pg (ref 26.0–34.0)
MCHC: 33.7 g/dL (ref 30.0–36.0)
MCV: 98.9 fL (ref 78.0–100.0)
PLATELETS: 133 10*3/uL — AB (ref 150–400)
RBC: 3.57 MIL/uL — AB (ref 4.22–5.81)
RDW: 11.6 % (ref 11.5–15.5)
WBC: 6.9 10*3/uL (ref 4.0–10.5)

## 2013-10-02 LAB — GLUCOSE, CAPILLARY
GLUCOSE-CAPILLARY: 103 mg/dL — AB (ref 70–99)
GLUCOSE-CAPILLARY: 107 mg/dL — AB (ref 70–99)
GLUCOSE-CAPILLARY: 122 mg/dL — AB (ref 70–99)
Glucose-Capillary: 106 mg/dL — ABNORMAL HIGH (ref 70–99)
Glucose-Capillary: 112 mg/dL — ABNORMAL HIGH (ref 70–99)
Glucose-Capillary: 95 mg/dL (ref 70–99)

## 2013-10-02 MED ORDER — SPIRONOLACTONE 12.5 MG HALF TABLET
12.5000 mg | ORAL_TABLET | Freq: Every day | ORAL | Status: DC
  Administered 2013-10-02 – 2013-10-07 (×6): 12.5 mg via ORAL
  Filled 2013-10-02 (×6): qty 1

## 2013-10-02 MED ORDER — DIGOXIN 0.0625 MG HALF TABLET
0.0625 mg | ORAL_TABLET | Freq: Every day | ORAL | Status: DC
  Administered 2013-10-02 – 2013-10-07 (×5): 0.0625 mg via ORAL
  Filled 2013-10-02 (×6): qty 1

## 2013-10-02 MED ORDER — CARVEDILOL 6.25 MG PO TABS
6.2500 mg | ORAL_TABLET | Freq: Two times a day (BID) | ORAL | Status: DC
  Administered 2013-10-02 (×2): 6.25 mg via ORAL
  Filled 2013-10-02 (×5): qty 1

## 2013-10-02 MED ORDER — DIGOXIN 125 MCG PO TABS
0.1250 mg | ORAL_TABLET | Freq: Every day | ORAL | Status: DC
  Filled 2013-10-02: qty 1

## 2013-10-02 MED ORDER — ALLOPURINOL 100 MG PO TABS
100.0000 mg | ORAL_TABLET | Freq: Every day | ORAL | Status: DC
  Administered 2013-10-02 – 2013-10-07 (×6): 100 mg via ORAL
  Filled 2013-10-02 (×6): qty 1

## 2013-10-02 MED ORDER — FUROSEMIDE 40 MG PO TABS: 40.0000 mg | ORAL_TABLET | Freq: Every day | ORAL | Status: DC

## 2013-10-02 MED ORDER — PANTOPRAZOLE SODIUM 40 MG IV SOLR
40.0000 mg | INTRAVENOUS | Status: DC
Start: 2013-10-02 — End: 2013-10-03
  Administered 2013-10-02: 40 mg via INTRAVENOUS
  Filled 2013-10-02 (×2): qty 40

## 2013-10-02 MED ORDER — OXYCODONE HCL 5 MG PO TABS
5.0000 mg | ORAL_TABLET | Freq: Two times a day (BID) | ORAL | Status: DC | PRN
  Administered 2013-10-02 – 2013-10-07 (×10): 5 mg via ORAL
  Filled 2013-10-02 (×15): qty 1

## 2013-10-02 MED ORDER — POTASSIUM CHLORIDE CRYS ER 10 MEQ PO TBCR: 10.0000 meq | EXTENDED_RELEASE_TABLET | Freq: Every day | ORAL | Status: DC

## 2013-10-02 MED ORDER — POTASSIUM CHLORIDE CRYS ER 20 MEQ PO TBCR
40.0000 meq | EXTENDED_RELEASE_TABLET | Freq: Three times a day (TID) | ORAL | Status: AC
  Administered 2013-10-02 (×2): 40 meq via ORAL
  Filled 2013-10-02 (×2): qty 2

## 2013-10-02 NOTE — Progress Notes (Signed)
Ref: [name]   Subjective:  Awake, somewhat confused/disoriented. Also has abdominal discomfort. No chest pain. Renal function improving.  Objective:  Vital Signs in the last 24 hours: Temp:  [98.1 F (36.7 C)-98.5 F (36.9 C)] 98.1 F (36.7 C) (08/17 0400) Pulse Rate:  [91-161] 97 (08/17 0700) Cardiac Rhythm:  [-] Sinus tachycardia (08/17 0700) Resp:  [11-20] 14 (08/17 0700) BP: (100-150)/(69-107) 133/97 mmHg (08/17 0700) SpO2:  [96 %-100 %] 99 % (08/17 0700) Arterial Line BP: (142)/(88) 142/88 mmHg (08/16 1000)  Physical Exam: BP Readings from Last 1 Encounters:  10/02/13 133/97    Wt Readings from Last 1 Encounters:  09/30/13 63.4 kg (139 lb 12.4 oz)    Weight change:   HEENT: [place]/AT, Eyes-Brown, PERL, EOMI, Conjunctiva-Pink, Sclera-Non-icteric Neck: No JVD, No bruit, Trachea midline. Lungs:  Clearing, Bilateral. Cardiac:  Regular rhythm, normal S1 and S2, no S3.  Abdomen:  Soft, non-tender. Extremities:  No edema present. No cyanosis. No clubbing. CNS: AxOx1, Cranial nerves grossly intact, moves all 4 extremities. Right handed. Skin: Warm and dry.   Intake/Output from previous day: 08/16 0701 - 08/17 0700 In: 1831.8 [I.V.:1831.8] Out: 1096 [Urine:1095; Stool:1]    Lab Results: BMET    Component Value Date/Time   NA 139 10/02/2013 0335   NA 139 10/01/2013 0330   NA 136* 09/30/2013 2251   K 3.4* 10/02/2013 0335   K 3.8 10/01/2013 0330   K 4.3 09/30/2013 2251   CL 105 10/02/2013 0335   CL 102 10/01/2013 0330   CL 98 09/30/2013 2251   CO2 22 10/02/2013 0335   CO2 17* 10/01/2013 0330   CO2 20 09/30/2013 2251   GLUCOSE 104* 10/02/2013 0335   GLUCOSE 135* 10/01/2013 0330   GLUCOSE 153* 09/30/2013 2251   BUN 37* 10/02/2013 0335   BUN 57* 10/01/2013 0330   BUN 59* 09/30/2013 2251   CREATININE 1.86* 10/02/2013 0335   CREATININE 2.82* 10/01/2013 0330   CREATININE 2.84* 09/30/2013 2251   CALCIUM 8.9 10/02/2013 0335   CALCIUM 9.2 10/01/2013 0330   CALCIUM 9.7  09/30/2013 2251   GFRNONAA 37* 10/02/2013 0335   GFRNONAA 22* 10/01/2013 0330   GFRNONAA 22* 09/30/2013 2251   GFRAA 43* 10/02/2013 0335   GFRAA 26* 10/01/2013 0330   GFRAA 26* 09/30/2013 2251   CBC    Component Value Date/Time   WBC 6.9 10/02/2013 0335   RBC 3.57* 10/02/2013 0335   HGB 11.9* 10/02/2013 0335   HCT 35.3* 10/02/2013 0335   PLT 133* 10/02/2013 0335   MCV 98.9 10/02/2013 0335   MCH 33.3 10/02/2013 0335   MCHC 33.7 10/02/2013 0335   RDW 11.6 10/02/2013 0335   LYMPHSABS 1.6 09/30/2013 1301   MONOABS 0.5 09/30/2013 1301   EOSABS 0.2 09/30/2013 1301   BASOSABS 0.1 09/30/2013 1301   HEPATIC Function Panel  Recent Labs  08/23/13 0118 08/24/13 0305 09/30/13 1301  PROT 6.9 6.1 7.1   HEMOGLOBIN A1C No components found with this basename: HGA1C,  MPG   CARDIAC ENZYMES Lab Results  Component Value Date   CKTOTAL 65 09/08/2011   CKMB 3.5 09/08/2011   TROPONINI <0.30 08/23/2013   TROPONINI <0.30 08/23/2013   TROPONINI <0.30 08/23/2013   BNP  Recent Labs  08/22/13 2223 09/30/13 1301 10/01/13 0330  PROBNP 1416.0* 1528.0* 2028.0*   TSH  Recent Labs  08/23/13 0118  TSH 1.440   CHOLESTEROL No results found for this basename: CHOL,  in the last 8760 hours  Scheduled Meds: .  sodium chloride   Intravenous Once  . allopurinol  100 mg Oral Daily  . antiseptic oral rinse  7 mL Mouth Rinse QID  . carvedilol  6.25 mg Oral BID WC  . chlorhexidine  15 mL Mouth Rinse BID  . digoxin  0.0625 mg Oral Daily  . edoxaban  30 mg Oral Q24H  . pantoprazole (PROTONIX) IV  40 mg Intravenous Q24H  . pneumococcal 23 valent vaccine  0.5 mL Intramuscular Tomorrow-1000  . potassium chloride SA  10 mEq Oral Daily  . spironolactone  12.5 mg Oral Daily   Continuous Infusions: . sodium chloride 75 mL/hr at 10/01/13 2136   PRN Meds:.acetaminophen, fentaNYL, LORazepam, ondansetron (ZOFRAN) IV, oxyCODONE  Assessment/Plan: Syncope  Acute on chronic left heart systolic failure  Acute respiratory  failure  Acute on chronic renal failure  Severe bradycardia -resolved  Severe hyperkalemia -resolved  Dilated cardiomyopathy  S/P SVT ablation x 2.  Alcohol use disorder  H/O atrial flutter  Digitalis toxicity ruled out  Dehydration-improving  Edoxaban resumed per pharmacy. Add protonix. Lower dose lanoxin. Resume spironolactone. Resume Coreg. Increase activity as tolerated.    LOS: 2 days    Dixie Dials  MD  10/02/2013, 9:10 AM

## 2013-10-02 NOTE — Progress Notes (Addendum)
PULMONARY / CRITICAL CARE MEDICINE   Name: [name] MRN: [id number] DOB: September 04, 1950    ADMISSION DATE:  09/30/2013 CONSULTATION DATE:    Loistine Chance MD :  Doylene Canard  INITIAL PRESENTATION:  H/O cardiomyopathy - EF 35-40% 08/23/13. Presented to ED with syncope due to bradycardic arrest. Intubated in ED. Taken to cath lab by Dr Doylene Canard for [facility] and temp pacer placement. Found to be profoundly hyperkalemic. 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 LVEF 25% 8/16 Hyperkalemia resolved. HCO3 gtt DC'd. Temp PM removed. Extubated    SUBJECTIVE:  Awake and alert sitting in chair and responding to questions appropriately. Complaining of diffuse severe ABD pain.    VITAL SIGNS: Temp:  [98.1 F (36.7 C)-98.5 F (36.9 C)] 98.1 F (36.7 C) (08/17 0400) Pulse Rate:  [91-161] 97 (08/17 0700) Resp:  [10-20] 14 (08/17 0700) BP: (100-150)/(69-107) 133/97 mmHg (08/17 0700) SpO2:  [96 %-100 %] 99 % (08/17 0700) Arterial Line BP: (114-142)/(79-88) 142/88 mmHg (08/16 1000) HEMODYNAMICS:   VENTILATOR SETTINGS:   INTAKE / OUTPUT:  Intake/Output Summary (Last 24 hours) at 10/02/13 0834 Last data filed at 10/02/13 0700  Gross per 24 hour  Intake 1722.75 ml  Output    946 ml  Net 776.75 ml    PHYSICAL EXAMINATION: General:   Thin male sitting in chair eating breakfast in NAD Neuro:  A&O X3 HEENT: EOMs intact, PERRL, no pain to palpation, trachea midline with no lymphademopathy  Cardiovascular: Reg (paced), no M noted Lungs:  Clear anteriorly Abdomen: Soft, +BS Ext: no edema, warm  LABS: I have reviewed all of today's lab results. 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  GASTROINTESTINAL A:   No issues P:   SUP: N/I post extubation Advance diet to heart healthy as tolerated.  HEMATOLOGIC A:   No issues P:  DVT px: SCDs Monitor CBC intermittently Transfuse per usual ICU guidelines Edoxaban to be resumed 8/17  INFECTIOUS A:  No issues P:   Monitor  ENDOCRINE A:  No issues P:   Monitor glu on chem panels Consider SSI for glu > 180  NEUROLOGIC A:   Acute enceph post arrest, probably resolved H/O EtOH abuse P:   Monitor for W/D symptoms Low dose PRN lorazepam  TODAY'S SUMMARY: Extubated, doing well, will titrate O2 down, replace electrolytes, PCCM will sign off, please call back if needed.  I have personally obtained a history, examined the patient, evaluated laboratory and imaging results, formulated the assessment and plan and placed orders.  Rush Farmer, M.D. [facility] Pulmonary/Critical Care Medicine. Pager: [phone]. After hours pager: [phone].  10/02/2013, 8:34 AM

## 2013-10-03 ENCOUNTER — Encounter (HOSPITAL_COMMUNITY): Payer: Self-pay | Admitting: *Deleted

## 2013-10-03 LAB — MAGNESIUM: Magnesium: 1.6 mg/dL (ref 1.5–2.5)

## 2013-10-03 LAB — CBC
HEMATOCRIT: 34.1 % — AB (ref 39.0–52.0)
Hemoglobin: 11.8 g/dL — ABNORMAL LOW (ref 13.0–17.0)
MCH: 33.8 pg (ref 26.0–34.0)
MCHC: 34.6 g/dL (ref 30.0–36.0)
MCV: 97.7 fL (ref 78.0–100.0)
PLATELETS: 116 10*3/uL — AB (ref 150–400)
RBC: 3.49 MIL/uL — ABNORMAL LOW (ref 4.22–5.81)
RDW: 11.5 % (ref 11.5–15.5)
WBC: 5.2 10*3/uL (ref 4.0–10.5)

## 2013-10-03 LAB — BASIC METABOLIC PANEL
ANION GAP: 12 (ref 5–15)
BUN: 24 mg/dL — AB (ref 6–23)
CALCIUM: 8.9 mg/dL (ref 8.4–10.5)
CO2: 22 mEq/L (ref 19–32)
Chloride: 106 mEq/L (ref 96–112)
Creatinine, Ser: 1.49 mg/dL — ABNORMAL HIGH (ref 0.50–1.35)
GFR, EST AFRICAN AMERICAN: 56 mL/min — AB (ref >90)
GFR, EST NON AFRICAN AMERICAN: 48 mL/min — AB (ref >90)
Glucose, Bld: 91 mg/dL (ref 70–99)
Potassium: 4 mEq/L (ref 3.7–5.3)
Sodium: 140 mEq/L (ref 137–147)

## 2013-10-03 LAB — GLUCOSE, CAPILLARY
Glucose-Capillary: 117 mg/dL — ABNORMAL HIGH (ref 70–99)
Glucose-Capillary: 90 mg/dL (ref 70–99)

## 2013-10-03 LAB — PHOSPHORUS: Phosphorus: 1.8 mg/dL — ABNORMAL LOW (ref 2.3–4.6)

## 2013-10-03 MED ORDER — CARVEDILOL 12.5 MG PO TABS
12.5000 mg | ORAL_TABLET | Freq: Two times a day (BID) | ORAL | Status: DC
  Administered 2013-10-03: 12.5 mg via ORAL
  Filled 2013-10-03 (×4): qty 1

## 2013-10-03 MED ORDER — DILTIAZEM HCL 60 MG PO TABS
60.0000 mg | ORAL_TABLET | Freq: Three times a day (TID) | ORAL | Status: DC
  Administered 2013-10-03 – 2013-10-04 (×2): 60 mg via ORAL
  Filled 2013-10-03 (×7): qty 1

## 2013-10-03 MED ORDER — PANTOPRAZOLE SODIUM 40 MG PO TBEC
40.0000 mg | DELAYED_RELEASE_TABLET | Freq: Every day | ORAL | Status: DC
  Administered 2013-10-03 – 2013-10-07 (×5): 40 mg via ORAL
  Filled 2013-10-03 (×5): qty 1

## 2013-10-03 MED ORDER — CARVEDILOL 12.5 MG PO TABS
12.5000 mg | ORAL_TABLET | Freq: Once | ORAL | Status: AC
  Administered 2013-10-03: 12.5 mg via ORAL
  Filled 2013-10-03: qty 1

## 2013-10-03 MED ORDER — DOCUSATE SODIUM 100 MG PO CAPS
100.0000 mg | ORAL_CAPSULE | Freq: Two times a day (BID) | ORAL | Status: DC | PRN
  Filled 2013-10-03 (×2): qty 1

## 2013-10-03 MED ORDER — CARVEDILOL 6.25 MG PO TABS
6.2500 mg | ORAL_TABLET | Freq: Once | ORAL | Status: AC
  Administered 2013-10-03: 6.25 mg via ORAL
  Filled 2013-10-03 (×2): qty 1

## 2013-10-03 MED ORDER — FUROSEMIDE 10 MG/ML IJ SOLN
40.0000 mg | Freq: Once | INTRAMUSCULAR | Status: AC
  Administered 2013-10-03: 40 mg via INTRAVENOUS
  Filled 2013-10-03: qty 4

## 2013-10-03 MED ORDER — MAGNESIUM SULFATE IN D5W 10-5 MG/ML-% IV SOLN
1.0000 g | Freq: Once | INTRAVENOUS | Status: AC
  Administered 2013-10-03: 1 g via INTRAVENOUS
  Filled 2013-10-03: qty 100

## 2013-10-03 MED ORDER — COLCHICINE 0.6 MG PO TABS
0.6000 mg | ORAL_TABLET | Freq: Every day | ORAL | Status: DC
  Administered 2013-10-03 – 2013-10-07 (×5): 0.6 mg via ORAL
  Filled 2013-10-03 (×5): qty 1

## 2013-10-03 NOTE — Evaluation (Signed)
Physical Therapy Evaluation Patient Details Name: [name] MRN: [id number] DOB: 11-07-1950 Today's Date: 10/03/2013   History of Present Illness  Pt is a 63 y/o male who was admitted after collapse and severe bradycardia. When he arrived to the ED, he was electively intubated, sedated and taken to the cath lab emergently. He underwent cardiac cath, and temporary pacemaker implantation. He was extubated on 10/01/13.  Clinical Impression  Patient evaluated by Physical Therapy with no further acute PT needs identified. All education has been completed and the patient has no further questions. At the time of PT eval pt was able to perform transfers and functional mobility at a modified independent or independent level. See below for any follow-up Physial Therapy or equipment needs. PT is signing off. If needs change, please reconsult. Thank you for this referral.     Follow Up Recommendations No PT follow up;Supervision - Intermittent    Equipment Recommendations  None recommended by PT    Recommendations for Other Services       Precautions / Restrictions Precautions Precautions: Fall Restrictions Weight Bearing Restrictions: No      Mobility  Bed Mobility Overal bed mobility: Independent                Transfers Overall transfer level: Modified independent Equipment used: None                Ambulation/Gait Ambulation/Gait assistance: Modified independent (Device/Increase time) Ambulation Distance (Feet): 250 Feet Assistive device: None Gait Pattern/deviations: WFL(Within Functional Limits)   Gait velocity interpretation: at or above normal speed for age/gender    Stairs            Wheelchair Mobility    Modified Rankin (Stroke Patients Only)       Balance Overall balance assessment: No apparent balance deficits (not formally assessed)                                           Pertinent Vitals/Pain Pain Assessment:  0-10 Pain Score: 2  Pain Location: Abdominal pain    Home Living Family/patient expects to be discharged to:: Private residence Living Arrangements: Spouse/significant other Available Help at Discharge: Family;Available 24 hours/day Type of Home: House Home Access: Stairs to enter Entrance Stairs-Rails: None Entrance Stairs-Number of Steps: 4 Home Layout: One level Home Equipment: Crutches      Prior Function Level of Independence: Independent         Comments: Still driving, no longer working - on disability. Community ambulator.     Hand Dominance   Dominant Hand: Right    Extremity/Trunk Assessment   Upper Extremity Assessment: Overall WFL for tasks assessed           Lower Extremity Assessment: Overall WFL for tasks assessed      Cervical / Trunk Assessment: Normal  Communication   Communication: No difficulties  Cognition Arousal/Alertness: Awake/alert Behavior During Therapy: WFL for tasks assessed/performed Overall Cognitive Status: Within Functional Limits for tasks assessed                      General Comments General comments (skin integrity, edema, etc.): Issued HEP and discussed proper technique, reps, sets, etc.     Exercises        Assessment/Plan    PT Assessment Patent does not need any further PT services  PT Diagnosis  PT Problem List    PT Treatment Interventions     PT Goals (Current goals can be found in the Care Plan section) Acute Rehab PT Goals Patient Stated Goal: Get back to his wife's cooking. PT Goal Formulation: No goals set, d/c therapy    Frequency     Barriers to discharge        Co-evaluation               End of Session Equipment Utilized During Treatment: Gait belt Activity Tolerance: Patient tolerated treatment well Patient left: in bed;with call bell/phone within reach Nurse Communication: Mobility status         Time: 1440-1505 PT Time Calculation (min): 25 min   Charges:    PT Evaluation $Initial PT Evaluation Tier I: 1 Procedure PT Treatments $Therapeutic Activity: 8-22 mins   PT G CodesJolyn Lent 10/03/2013, 3:42 PM  Jolyn Lent, PT, DPT Acute Rehabilitation Services Pager: [phone]

## 2013-10-03 NOTE — Progress Notes (Signed)
Ref: [name]   Subjective:  Confused but awake. Improved heart rate with Coreg use.  Objective:  Vital Signs in the last 24 hours: Temp:  [97.6 F (36.4 C)-98.4 F (36.9 C)] 98.4 F (36.9 C) (08/18 0730) Pulse Rate:  [98-106] 106 (08/18 0800) Cardiac Rhythm:  [-] Sinus tachycardia;Bundle branch block (08/18 0400) Resp:  [9-23] 15 (08/18 0800) BP: (81-146)/(65-103) 122/96 mmHg (08/18 0800) SpO2:  [94 %-100 %] 96 % (08/18 0800)  Physical Exam: BP Readings from Last 1 Encounters:  10/03/13 122/96    Wt Readings from Last 1 Encounters:  09/30/13 63.4 kg (139 lb 12.4 oz)    Weight change:   HEENT: [place]/AT, Eyes-Brown, PERL, EOMI, Conjunctiva-Pink, Sclera-Non-icteric Neck: No JVD, No bruit, Trachea midline. Lungs:  Clearing, Bilateral. Cardiac:  Regular rhythm, normal S1 and S2, no S3.  Abdomen:  Soft, non-tender. Extremities:  No edema present. No cyanosis. No clubbing. CNS: AxOx2, Cranial nerves grossly intact, moves all 4 extremities. Right handed. Skin: Warm and dry.   Intake/Output from previous day: 08/17 0701 - 08/18 0700 In: 1549.6 [P.O.:480; I.V.:1069.6] Out: 500 [Urine:500]    Lab Results: BMET    Component Value Date/Time   NA 140 10/03/2013 0249   NA 139 10/02/2013 0335   NA 139 10/01/2013 0330   K 4.0 10/03/2013 0249   K 3.4* 10/02/2013 0335   K 3.8 10/01/2013 0330   CL 106 10/03/2013 0249   CL 105 10/02/2013 0335   CL 102 10/01/2013 0330   CO2 22 10/03/2013 0249   CO2 22 10/02/2013 0335   CO2 17* 10/01/2013 0330   GLUCOSE 91 10/03/2013 0249   GLUCOSE 104* 10/02/2013 0335   GLUCOSE 135* 10/01/2013 0330   BUN 24* 10/03/2013 0249   BUN 37* 10/02/2013 0335   BUN 57* 10/01/2013 0330   CREATININE 1.49* 10/03/2013 0249   CREATININE 1.86* 10/02/2013 0335   CREATININE 2.82* 10/01/2013 0330   CALCIUM 8.9 10/03/2013 0249   CALCIUM 8.9 10/02/2013 0335   CALCIUM 9.2 10/01/2013 0330   GFRNONAA 48* 10/03/2013 0249   GFRNONAA 37* 10/02/2013 0335   GFRNONAA 22*  10/01/2013 0330   GFRAA 56* 10/03/2013 0249   GFRAA 43* 10/02/2013 0335   GFRAA 26* 10/01/2013 0330   CBC    Component Value Date/Time   WBC 5.2 10/03/2013 0249   RBC 3.49* 10/03/2013 0249   HGB 11.8* 10/03/2013 0249   HCT 34.1* 10/03/2013 0249   PLT 116* 10/03/2013 0249   MCV 97.7 10/03/2013 0249   MCH 33.8 10/03/2013 0249   MCHC 34.6 10/03/2013 0249   RDW 11.5 10/03/2013 0249   LYMPHSABS 1.6 09/30/2013 1301   MONOABS 0.5 09/30/2013 1301   EOSABS 0.2 09/30/2013 1301   BASOSABS 0.1 09/30/2013 1301   HEPATIC Function Panel  Recent Labs  08/23/13 0118 08/24/13 0305 09/30/13 1301  PROT 6.9 6.1 7.1   HEMOGLOBIN A1C No components found with this basename: HGA1C,  MPG   CARDIAC ENZYMES Lab Results  Component Value Date   CKTOTAL 65 09/08/2011   CKMB 3.5 09/08/2011   TROPONINI <0.30 08/23/2013   TROPONINI <0.30 08/23/2013   TROPONINI <0.30 08/23/2013   BNP  Recent Labs  08/22/13 2223 09/30/13 1301 10/01/13 0330  PROBNP 1416.0* 1528.0* 2028.0*   TSH  Recent Labs  08/23/13 0118  TSH 1.440   CHOLESTEROL No results found for this basename: CHOL,  in the last 8760 hours  Scheduled Meds: . sodium chloride   Intravenous Once  . allopurinol  100 mg Oral Daily  . carvedilol  12.5 mg Oral BID WC  . colchicine  0.6 mg Oral Daily  . digoxin  0.0625 mg Oral Daily  . edoxaban  30 mg Oral Q24H  . furosemide  40 mg Intravenous Once  . pantoprazole  40 mg Oral Daily  . pneumococcal 23 valent vaccine  0.5 mL Intramuscular Tomorrow-1000  . spironolactone  12.5 mg Oral Daily   Continuous Infusions: . sodium chloride 25 mL/hr at 10/03/13 0817   PRN Meds:.acetaminophen, fentaNYL, LORazepam, ondansetron (ZOFRAN) IV, oxyCODONE  Assessment/Plan: Syncope  Acute on chronic left heart systolic failure  Acute respiratory failure  Acute on chronic renal failure  Severe bradycardia -resolved  Severe hyperkalemia -resolved  Dilated cardiomyopathy  S/P SVT ablation x 2.  Alcohol use disorder   H/O atrial flutter  Digitalis toxicity ruled out  Dehydration-improving  Increase Coreg dose.  Lasix. Increase activity.   LOS: 3 days    Dixie Dials  MD  10/03/2013, 8:33 AM

## 2013-10-03 NOTE — Progress Notes (Signed)
PT Cancellation Note  Patient Details Name: [name] MRN: [id number] DOB: August 07, 1950   Cancelled Treatment:    Reason Eval/Treat Not Completed: Patient declined, no reason specified. Per RN, pt has been sitting up in the chair this morning and just got back to bed, pt reports he just wants to rest. RN states that pt is doing well functionally. Will attempt again this afternoon, time permitting.    Jolyn Lent 10/03/2013, 11:37 AM  Jolyn Lent, PT, DPT Acute Rehabilitation Services Pager: [phone]

## 2013-10-03 NOTE — Progress Notes (Signed)
Pt had two episodes of SVT at 0112am and 0230am; both currencies HR was in 190's, but pt asymptomatic; Nurse accidentally notified [facility] cardiologist and received order for Coreg, but noticed MD mistake and called Doylene Canard MD to verify medication; Doylene Canard MD confirmed that Coreg was okay to give pt; Will continue to monitor Pt: Hr currently 107 BP 121/82 MAP 90 O2 96%.

## 2013-10-04 LAB — BASIC METABOLIC PANEL
Anion gap: 12 (ref 5–15)
BUN: 18 mg/dL (ref 6–23)
CO2: 24 mEq/L (ref 19–32)
Calcium: 8.6 mg/dL (ref 8.4–10.5)
Chloride: 100 mEq/L (ref 96–112)
Creatinine, Ser: 1.43 mg/dL — ABNORMAL HIGH (ref 0.50–1.35)
GFR calc Af Amer: 59 mL/min — ABNORMAL LOW (ref >90)
GFR, EST NON AFRICAN AMERICAN: 51 mL/min — AB (ref >90)
GLUCOSE: 94 mg/dL (ref 70–99)
Potassium: 4.1 mEq/L (ref 3.7–5.3)
Sodium: 136 mEq/L — ABNORMAL LOW (ref 137–147)

## 2013-10-04 MED ORDER — DILTIAZEM HCL ER 60 MG PO CP12
120.0000 mg | ORAL_CAPSULE | Freq: Two times a day (BID) | ORAL | Status: DC
  Administered 2013-10-04 – 2013-10-06 (×4): 120 mg via ORAL
  Filled 2013-10-04 (×8): qty 2

## 2013-10-04 MED ORDER — CARVEDILOL 25 MG PO TABS
25.0000 mg | ORAL_TABLET | Freq: Two times a day (BID) | ORAL | Status: DC
  Administered 2013-10-04 – 2013-10-07 (×5): 25 mg via ORAL
  Filled 2013-10-04 (×8): qty 1

## 2013-10-04 MED ORDER — DILTIAZEM HCL ER COATED BEADS 120 MG PO TB24: 120.0000 mg | ORAL_TABLET | Freq: Two times a day (BID) | ORAL | Status: DC

## 2013-10-04 MED ORDER — MAGNESIUM CITRATE PO SOLN
300.0000 mL | Freq: Once | ORAL | Status: AC
  Administered 2013-10-04: 300 mL via ORAL
  Filled 2013-10-04: qty 592

## 2013-10-04 NOTE — Progress Notes (Signed)
Report given to receiving RN. Patient sitting in bed resting. No verbal complaints and no signs or symptoms of distress or discomfort noted.

## 2013-10-04 NOTE — Progress Notes (Signed)
Patient converted to [name] around 0200 and morning strip was [name]. Notified MD and made him aware. MD stated that EKG was not necessary. Will continue to monitor patient for further changes in condition.

## 2013-10-04 NOTE — Progress Notes (Signed)
Patient had a 7 beat run of V-Tach. Patient was in bed with no verbal complaints and no signs or symptoms of distress or discomfort. MD notified and made aware no new orders given at this time. Will continue to monitor patient for further changes in condition.

## 2013-10-04 NOTE — Progress Notes (Signed)
Pt had episodes of SVT and near syncopal episode while having BM, pt stated "I'm almost to faint". VS were taken. RRT Nevin Bloodgood came and saw the pt. Pt back to bed and stated " I feel a lot better after having the BM" , HR down to 103bpm Sinus Tachy. [name] was informed and no new orders at this time. Will continue to monitor pt.

## 2013-10-04 NOTE — Progress Notes (Signed)
Ref: [name]   Subjective:  More alert. Ambulated well. Few episodes of dysrhythmia/SVT.  Objective:  Vital Signs in the last 24 hours: Temp:  [98 F (36.7 C)-98.6 F (37 C)] 98.3 F (36.8 C) (08/19 1753) Pulse Rate:  [72-103] 86 (08/19 1753) Cardiac Rhythm:  [-] Atrial flutter (08/19 0730) Resp:  [16-18] 18 (08/19 1753) BP: (102-148)/(54-84) 111/65 mmHg (08/19 1753) SpO2:  [98 %-100 %] 98 % (08/19 1753) Weight:  [64.683 kg (142 lb 9.6 oz)-64.8 kg (142 lb 13.7 oz)] 64.8 kg (142 lb 13.7 oz) (08/19 1610)  Physical Exam: BP Readings from Last 1 Encounters:  10/04/13 111/65    Wt Readings from Last 1 Encounters:  10/04/13 64.8 kg (142 lb 13.7 oz)    Weight change:   HEENT: [place]/AT, Eyes-Brown, PERL, EOMI, Conjunctiva-Pink, Sclera-Non-icteric Neck: No JVD, No bruit, Trachea midline. Lungs:  Clear, Bilateral. Cardiac:  Regular rhythm, normal S1 and S2, no S3.  Abdomen:  Soft, non-tender. Extremities:  No edema present. No cyanosis. No clubbing. CNS: AxOx3, Cranial nerves grossly intact, moves all 4 extremities. Right handed. Skin: Warm and dry.   Intake/Output from previous day: 08/18 0701 - 08/19 0700 In: 1410 [P.O.:960; I.V.:350; IV Piggyback:100] Out: 2300 [Urine:2300]    Lab Results: BMET    Component Value Date/Time   NA 136* 10/04/2013 0328   NA 140 10/03/2013 0249   NA 139 10/02/2013 0335   K 4.1 10/04/2013 0328   K 4.0 10/03/2013 0249   K 3.4* 10/02/2013 0335   CL 100 10/04/2013 0328   CL 106 10/03/2013 0249   CL 105 10/02/2013 0335   CO2 24 10/04/2013 0328   CO2 22 10/03/2013 0249   CO2 22 10/02/2013 0335   GLUCOSE 94 10/04/2013 0328   GLUCOSE 91 10/03/2013 0249   GLUCOSE 104* 10/02/2013 0335   BUN 18 10/04/2013 0328   BUN 24* 10/03/2013 0249   BUN 37* 10/02/2013 0335   CREATININE 1.43* 10/04/2013 0328   CREATININE 1.49* 10/03/2013 0249   CREATININE 1.86* 10/02/2013 0335   CALCIUM 8.6 10/04/2013 0328   CALCIUM 8.9 10/03/2013 0249   CALCIUM 8.9 10/02/2013  0335   GFRNONAA 51* 10/04/2013 0328   GFRNONAA 48* 10/03/2013 0249   GFRNONAA 37* 10/02/2013 0335   GFRAA 59* 10/04/2013 0328   GFRAA 56* 10/03/2013 0249   GFRAA 43* 10/02/2013 0335   CBC    Component Value Date/Time   WBC 5.2 10/03/2013 0249   RBC 3.49* 10/03/2013 0249   HGB 11.8* 10/03/2013 0249   HCT 34.1* 10/03/2013 0249   PLT 116* 10/03/2013 0249   MCV 97.7 10/03/2013 0249   MCH 33.8 10/03/2013 0249   MCHC 34.6 10/03/2013 0249   RDW 11.5 10/03/2013 0249   LYMPHSABS 1.6 09/30/2013 1301   MONOABS 0.5 09/30/2013 1301   EOSABS 0.2 09/30/2013 1301   BASOSABS 0.1 09/30/2013 1301   HEPATIC Function Panel  Recent Labs  08/23/13 0118 08/24/13 0305 09/30/13 1301  PROT 6.9 6.1 7.1   HEMOGLOBIN A1C No components found with this basename: HGA1C,  MPG   CARDIAC ENZYMES Lab Results  Component Value Date   CKTOTAL 65 09/08/2011   CKMB 3.5 09/08/2011   TROPONINI <0.30 08/23/2013   TROPONINI <0.30 08/23/2013   TROPONINI <0.30 08/23/2013   BNP  Recent Labs  08/22/13 2223 09/30/13 1301 10/01/13 0330  PROBNP 1416.0* 1528.0* 2028.0*   TSH  Recent Labs  08/23/13 0118  TSH 1.440   CHOLESTEROL No results found for this basename:  CHOL,  in the last 8760 hours  Scheduled Meds: . sodium chloride   Intravenous Once  . allopurinol  100 mg Oral Daily  . carvedilol  25 mg Oral BID WC  . colchicine  0.6 mg Oral Daily  . digoxin  0.0625 mg Oral Daily  . diltiazem  120 mg Oral Q12H  . edoxaban  30 mg Oral Q24H  . pantoprazole  40 mg Oral Daily  . pneumococcal 23 valent vaccine  0.5 mL Intramuscular Tomorrow-1000  . spironolactone  12.5 mg Oral Daily   Continuous Infusions: . sodium chloride 25 mL/hr at 10/03/13 2035   PRN Meds:.acetaminophen, docusate sodium, LORazepam, ondansetron (ZOFRAN) IV, oxyCODONE  Assessment/Plan: Syncope  Acute on chronic left heart systolic failure  Acute respiratory failure  Acute on chronic renal failure  Severe bradycardia -resolved  Severe hyperkalemia  -resolved  Dilated cardiomyopathy  S/P SVT ablation x 2.  Alcohol use disorder  H/O atrial flutter  Digitalis toxicity ruled out  Dehydration-improving  Increase activity. Home in AM if stable.    LOS: 4 days    Dixie Dials  MD  10/04/2013, 6:35 PM

## 2013-10-05 ENCOUNTER — Encounter (HOSPITAL_COMMUNITY)
Admission: EM | Disposition: A | Discharge status: Home / Self Care | Payer: Self-pay | Attending: Cardiovascular Disease

## 2013-10-05 DIAGNOSIS — I5022 Chronic systolic (congestive) heart failure: Secondary | ICD-10-CM

## 2013-10-05 DIAGNOSIS — I447 Left bundle-branch block, unspecified: Secondary | ICD-10-CM

## 2013-10-05 DIAGNOSIS — I495 Sick sinus syndrome: Secondary | ICD-10-CM

## 2013-10-05 DIAGNOSIS — I4892 Unspecified atrial flutter: Secondary | ICD-10-CM

## 2013-10-05 HISTORY — PX: BI-VENTRICULAR PACEMAKER INSERTION: SHX5462

## 2013-10-05 LAB — CBC
HCT: 27.5 % — ABNORMAL LOW (ref 39.0–52.0)
Hemoglobin: 9.7 g/dL — ABNORMAL LOW (ref 13.0–17.0)
MCH: 34.3 pg — AB (ref 26.0–34.0)
MCHC: 35.3 g/dL (ref 30.0–36.0)
MCV: 97.2 fL (ref 78.0–100.0)
Platelets: 136 10*3/uL — ABNORMAL LOW (ref 150–400)
RBC: 2.83 MIL/uL — AB (ref 4.22–5.81)
RDW: 11.4 % — ABNORMAL LOW (ref 11.5–15.5)
WBC: 5 10*3/uL (ref 4.0–10.5)

## 2013-10-05 LAB — BASIC METABOLIC PANEL
Anion gap: 10 (ref 5–15)
BUN: 15 mg/dL (ref 6–23)
CO2: 24 mEq/L (ref 19–32)
Calcium: 9.1 mg/dL (ref 8.4–10.5)
Chloride: 103 mEq/L (ref 96–112)
Creatinine, Ser: 1.23 mg/dL (ref 0.50–1.35)
GFR calc Af Amer: 70 mL/min — ABNORMAL LOW (ref >90)
GFR calc non Af Amer: 61 mL/min — ABNORMAL LOW (ref >90)
GLUCOSE: 119 mg/dL — AB (ref 70–99)
POTASSIUM: 4.1 meq/L (ref 3.7–5.3)
Sodium: 137 mEq/L (ref 137–147)

## 2013-10-05 SURGERY — BI-VENTRICULAR PACEMAKER INSERTION (CRT-P)
Anesthesia: LOCAL

## 2013-10-05 MED ORDER — CHLORHEXIDINE GLUCONATE 4 % EX LIQD
60.0000 mL | Freq: Once | CUTANEOUS | Status: DC
  Filled 2013-10-05: qty 60

## 2013-10-05 MED ORDER — ONDANSETRON HCL 4 MG/2ML IJ SOLN: 4.0000 mg | Freq: Four times a day (QID) | INTRAMUSCULAR | Status: DC | PRN

## 2013-10-05 MED ORDER — ACETAMINOPHEN 325 MG PO TABS
325.0000 mg | ORAL_TABLET | ORAL | Status: DC | PRN
  Administered 2013-10-05: 650 mg via ORAL
  Administered 2013-10-06 – 2013-10-07 (×2): 325 mg via ORAL
  Filled 2013-10-05: qty 2
  Filled 2013-10-05 (×2): qty 1

## 2013-10-05 MED ORDER — MIDAZOLAM HCL 5 MG/5ML IJ SOLN
INTRAMUSCULAR | Status: AC
  Filled 2013-10-05: qty 5

## 2013-10-05 MED ORDER — CEFAZOLIN SODIUM-DEXTROSE 2-3 GM-% IV SOLR
2.0000 g | Freq: Four times a day (QID) | INTRAVENOUS | Status: AC
  Administered 2013-10-05 – 2013-10-06 (×3): 2 g via INTRAVENOUS
  Filled 2013-10-05 (×4): qty 50

## 2013-10-05 MED ORDER — HEPARIN (PORCINE) IN NACL 2-0.9 UNIT/ML-% IJ SOLN
INTRAMUSCULAR | Status: AC
  Filled 2013-10-05: qty 500

## 2013-10-05 MED ORDER — CEFAZOLIN SODIUM-DEXTROSE 2-3 GM-% IV SOLR
2.0000 g | INTRAVENOUS | Status: AC
  Administered 2013-10-05: 2 g via INTRAVENOUS
  Filled 2013-10-05 (×2): qty 50

## 2013-10-05 MED ORDER — LIDOCAINE HCL (PF) 1 % IJ SOLN
INTRAMUSCULAR | Status: AC
  Filled 2013-10-05: qty 30

## 2013-10-05 MED ORDER — FENTANYL CITRATE 0.05 MG/ML IJ SOLN
INTRAMUSCULAR | Status: AC
  Administered 2013-10-05: 19:00:00
  Filled 2013-10-05: qty 2

## 2013-10-05 MED ORDER — SODIUM CHLORIDE 0.9 % IV SOLN: INTRAVENOUS | Status: DC

## 2013-10-05 MED ORDER — SODIUM CHLORIDE 0.9 % IR SOLN
80.0000 mg | Status: AC
  Administered 2013-10-05: 80 mg
  Filled 2013-10-05: qty 2

## 2013-10-05 NOTE — Progress Notes (Signed)
Patient continues to brady down to the 40s and high 30s. It was reported by night shift as well. It is non-sustaining. Patient is asymptomatic and there are no signs or symptoms of distress or discomfort. MD notified and made aware. Told to hold all cardiac medications and that he would be on the unit to follow up with patient. Will continue to monitor patient for further changes in condition.

## 2013-10-05 NOTE — Progress Notes (Signed)
Report given to receiving RN. Patient in bed resting, with family at the bedside. No verbal complaints and no signs or symptoms of distress or discomfort noted.

## 2013-10-05 NOTE — Consult Note (Signed)
ELECTROPHYSIOLOGY CONSULT NOTE    Patient ID: [name] MRN: [id number], DOB/AGE: 1951/01/24 63 y.o.  Admit date: 09/30/2013 Date of Consult: 10-05-13  Primary Physician: [name] Primary Cardiologist: Doylene Canard  Reason for Consultation: atrial flutter, bradycardic arrest  HPI:  [name] is a 63 y.o. 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  He was at a funeral home on the day of admission when he had a syncopal spell. On EMS arrival, HR was 20 and he was externally paced until temp wire could be placed at [facility].  Cardiac catheterization at that time demonstrated non obstructive CAD with severe LV dysfunction.  Labs on admission were notable for K of 7.8, creat 3.70, lactate 2.37.  He was intubated for a short time.  His bradycardia resolved within 24 hours with electrolyte abnormality correction and his temporary pacing wire was removed.  Post extubation, he has been in an atypical atrial flutter with ventricular rates 40-70's.  There have been some periods of tachycardia with rates in the 170's associated with pre-syncope.  EP has been asked to evaluate for treatment options.  Echo 08-23-13 demonstrated EF 35-40%, previously worse, moderate hypokinesis of entire myocardium, moderate MR, LA 55.  His acute renal injury has resolved as well as his electrolytes.  He is ambulating with assistance.  His last syncopal spell was in July when he was outside, got dehydrated and had a syncopal spell. He currently denies chest pain, shortness of breath, orthopnea, LE edema, dizziness.  ROS is otherwise negative except as outlined above.   Past Medical History  Diagnosis Date  . Hypertension   . GERD (gastroesophageal reflux disease)   . Hiatal hernia   . CHF (congestive heart failure)   .  Cardiomyopathy     nonischemic   . COPD (chronic obstructive pulmonary disease)     previously listed in DC summary from Feb 2012   . Chronic kidney disease (CKD), stage II (mild)     listed in chart although patient denies   . Atrial fibrillation      previously listed in DC summary from August 31, 2011, ? atrial tachycardia   . SVT (supraventricular tachycardia)   . Heart murmur     "dx'd in middle school" (12/22/2012)  . Pneumonia     "once when I was a baby; probably 3-4 times since then" (12/22/2012)  . Shortness of breath     "all the time last couple months" (12/22/2012)  . QMGQQPYP(950.9)     "weekly" (01/01/2013)  . Arthritis     "neck" (12/22/2012)  . Gout   . Depression      Surgical History:  Past Surgical History  Procedure Laterality Date  . Tee without cardioversion N/A 05/11/2012    Procedure: TRANSESOPHAGEAL ECHOCARDIOGRAM (TEE);  Surgeon: Birdie Riddle, MD;  Location[facility] ENDOSCOPY;  Service: Cardiovascular;  Laterality: N/A;  . Cardioversion N/A 05/11/2012    Procedure: Electrocardioversion, external;  Surgeon: Birdie Riddle, MD;  Location: [place];  Service: Cardiovascular;  Laterality: N/A;  . Cardioversion N/A 05/11/2012    Procedure: CARDIOVERSION AT THE BEDSIDE;  Surgeon: Birdie Riddle, MD;  Location: [place];  Service: Cardiovascular;  Laterality: N/A;  . Supraventricular tachycardia ablation  08/2011; 09/2011  . Cardiac catheterization  08/2011     Prescriptions prior to admission  Medication Sig  Dispense Refill  . allopurinol (ZYLOPRIM) 100 MG tablet Take 100 mg by mouth daily.       . carvedilol (COREG) 6.25 MG tablet Take 6.25 mg by mouth 2 (two) times daily with a meal.      . colchicine 0.6 MG tablet Take 0.6 mg by mouth 3 (three) times a week.       . digoxin (LANOXIN) 0.125 MG tablet Take 1 tablet (0.125 mg total) by mouth daily.  30 tablet  3  . diltiazem (CARDIZEM) 120 MG tablet Take 120 mg by mouth 2 (two) times daily.      . Edoxaban Tosylate  (SAVAYSA) 30 MG TABS Take 30 mg by mouth daily.       . furosemide (LASIX) 40 MG tablet Take 40 mg by mouth daily.       Marland Kitchen lisinopril (PRINIVIL,ZESTRIL) 20 MG tablet Take 0.5 tablets (10 mg total) by mouth daily.      . Multiple Vitamin (MULTIVITAMIN WITH MINERALS) TABS tablet Take 1 tablet by mouth daily.      Marland Kitchen oxyCODONE (OXY IR/ROXICODONE) 5 MG immediate release tablet Take 5 mg by mouth 2 (two) times daily as needed for moderate pain or severe pain.      . pantoprazole (PROTONIX) 40 MG tablet Take 40 mg by mouth daily as needed (for heartburn).       . potassium chloride SA (K-DUR,KLOR-CON) 20 MEQ tablet Take 10 mEq by mouth daily.       . rosuvastatin (CRESTOR) 20 MG tablet Take 10 mg by mouth every Monday, Wednesday, and Friday.      Marland Kitchen spironolactone (ALDACTONE) 12.5 mg TABS tablet Take 0.5 tablets (12.5 mg total) by mouth daily.  30 tablet  0    Inpatient Medications:  . sodium chloride   Intravenous Once  . allopurinol  100 mg Oral Daily  . carvedilol  25 mg Oral BID WC  . colchicine  0.6 mg Oral Daily  . digoxin  0.0625 mg Oral Daily  . diltiazem  120 mg Oral Q12H  . edoxaban  30 mg Oral Q24H  . pantoprazole  40 mg Oral Daily  . pneumococcal 23 valent vaccine  0.5 mL Intramuscular Tomorrow-1000  . spironolactone  12.5 mg Oral Daily    Allergies: No Known Allergies  History   Social History  . Marital Status: Single    Spouse Name: N/A    Number of Children: N/A  . Years of Education: N/A   Occupational History  . Not on file.   Social History Main Topics  . Smoking status: Former Smoker -- 0.25 packs/day for 15 years    Types: Cigarettes    Quit date: 07/17/2011  . Smokeless tobacco: Never Used  . Alcohol Use: 3.6 oz/week    6 Cans of beer per week     Comment: 12/22/2012 1, ~ 24oz beer q other day at most"  . Drug Use: Yes    Special: Cocaine, Marijuana     Comment: 12/22/2012 "Cocaine and Marijuana in 1990's and stopped all drug abuse in 2003"  . Sexual  Activity: Not Currently   Other Topics Concern  . Not on file   Social History Narrative  . No narrative on file     Family History  Problem Relation Age of Onset  . Lung cancer Mother     passed away from lung cancer  . Pneumonia Father     passed sway from PNA.  . Stroke Sister   .  Diabetes Sister   . Seizures Brother   . Seizures Brother     BP 101/55  Pulse 50  Temp(Src) 98.7 F (37.1 C) (Oral)  Resp 18  Ht 5\' 9"  (1.753 m)  Wt 146 lb 1.6 oz (66.271 kg)  BMI 21.57 kg/m2  SpO2 96%  Physical Exam: stable appearing 63yo man, NAD HEENT: Unremarkable,[place], AT Neck:  6 JVD, no thyromegally Back:  No CVA tenderness Lungs:  Clear with no wheezes, rales, or rhonchi HEART:  Regular rate rhythm, no murmurs, no rubs, no clicks Abd:  soft, positive bowel sounds, no organomegally, no rebound, no guarding Ext:  2 plus pulses, no edema, no cyanosis, no clubbing Skin:  No rashes no nodules Neuro:  CN II through XII intact, motor grossly intact   Labs:   Lab Results  Component Value Date   WBC 5.0 10/05/2013   HGB 9.7* 10/05/2013   HCT 27.5* 10/05/2013   MCV 97.2 10/05/2013   PLT 136* 10/05/2013    Recent Labs Lab 09/30/13 1301  10/05/13 0530  NA 131*  < > 137  K 7.8*  < > 4.1  CL 97  < > 103  CO2 20  < > 24  BUN 62*  < > 15  CREATININE 3.70*  < > 1.23  CALCIUM 9.6  < > 9.1  PROT 7.1  --   --   BILITOT 0.6  --   --   ALKPHOS 82  --   --   ALT 36  --   --   AST 54*  --   --   GLUCOSE 106*  < > 119*  < > = values in this interval not displayed.  Radiology/Studies: Dg Chest Port 1 View 10/01/2013   CLINICAL DATA:  Followup respiratory failure.  EXAM: PORTABLE CHEST - 1 VIEW  COMPARISON:  09/30/2013  FINDINGS: New nasogastric tube passes below the diaphragm into the distal stomach.  Endotracheal tube is stable in well positioned with its tip projecting 3.2 cm above the carina.  Heart, mediastinum hila are unremarkable.  Lungs are clear.  IMPRESSION: 1. No acute  cardiopulmonary disease. Support apparatus well positioned.   Electronically Signed   By: Lajean Manes M.D.   On: 10/01/2013 08:44    VHQ:IONGEXBM atrial flutter, ventricular rate 98, IVCD, QRS 126  TELEMETRY: atrial flutter, ventricular rates 50-80's  A/P 1. Bradycardic arrest in the setting of coreg 2. Atypical atrial flutter with a RVR with HR's over 150/min 3. LBBB with a QRS of 130 4. Class 3-4 CHF during his RVR, class 2 when rate is controlled.  5. Chronic renal insufficiency 6. HTN 7. Acute on chronic systolic CHF, EF 84%, previously 25%. Rec: A difficult combination of problems. I have recommended proceeding with BiV PPM insertion. He has too many comorbidities to recommend a BiV ICD. He may ultimately require AV node ablation but for now I would suggest placement of the BiV PPM and uptitration of his AV nodal blocking drugs.  I have discussed the risks/benefits/goals/expectations of the procedure and he wishes to proceed.  Mikle Bosworth.D.

## 2013-10-05 NOTE — CV Procedure (Signed)
EP Procedure Note    SURGEON: Cristopher Peru, MD   PREPROCEDURE DIAGNOSIS: Symptomatic tachy-brady syndrome, chronic systolic heart failure, class 3, LBBB with a QRS duration of 130 ms, atrial flutter   POSTPROCEDURE DIAGNOSIS: same as pre-procedure  PROCEDURES:  1. Coronary sinus venography.  2. Biventricular Pacemaker implantation.   INTRODUCTION: [name] is a 63 y.o. 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 The patient reports intermittent episodes of dizziness over the past few months and presented with syncope and complete heart block, requiring a temporary transvenous pacemaker. His AV conduction improved, with underlying left bundle branch block, but he developed atrial flutter with a rapid ventricular rate, requiring additional AV nodal blocking drugs. His atrial flutter was atypical, appearing to originate from the left atrium. No reversible causes have been identified. He is expected to V paced > 40% and has an EF of 40%. The patient therefore presents today for biventricular pacemaker implantation.   DESCRIPTION OF PROCEDURE: Informed written consent was obtained, and the patient was brought to the electrophysiology lab in a fasting state. The patient required no sedation for the procedure today. The patients left chest was prepped and draped in the usual sterile fashion by the EP lab staff. The skin overlying the left deltopectoral region was infiltrated with lidocaine for local analgesia. A 5-cm incision was made over the left deltopectoral region. A left subcutaneous pacemaker pocket was fashioned using a combination of sharp and blunt dissection. Electrocautery was required to assure hemostasis.    RA/RV Lead Placement:  The left axillary vein was cannulated. Through the left axillary vein, a [place] [id number] (serial number [id number]) right atrial lead and a [place] [id number] (serial  number [id number]) right ventricular lead were advanced with fluoroscopic visualization into the right atrial appendage and right ventricular apical septal positions respectively. Initial atrial lead P- waves measured 2 mV with impedance of 541 ohms and a threshold of 0.5 V at 0.5 msec. Right ventricular lead R-waves measured 10 mV with an impedance of 447 ohms and a threshold of 0.7 V at 0.5 msec. Both leads were secured to the pectoralis fascia using #2-0 silk over the suture sleeves.   LV Lead Placement:  A St. Jude guide was advanced through the left axillary vein into the low lateral right atrium. A 6 french hexapolar EP catheter was introduced through the [place]. Jude guide and used to cannulate the coronary sinus. A coronary sinus nonselective venogram was performed by hand injection of nonionic contrast. This demonstrated a large posterior vein and a valve just above it.  A 0.014 angioplasty guide wire was introduced through the St. Jude Guide and advanced into the posterior vein. A St. Jude(serial number [id number]) lead was advanced through the posterior vein into the distal lateral branch. This was approximately two-thirds from the base to the apex in a very lateral  position. In this location with bipolar configuration, the left ventricular lead R-waves measured 5.6 mV with impedance of 810 ohms and a threshold of 1.2 volt at 0.5 Milliseconds with no diaphragmatic stimulation observed when pacing at 10 volts output. The St. Jude guide was therefore removed.  All three leads were secured to the pectoralis fascia using #2 silk suture over the suture sleeves. The pocket then irrigated with copious gentamicin solution.   Device Placement:  The leads were then connected to the [facility]. Jude (serial number [id number]) pacemaker. The pacemaker  was then placed into the pocket. The pocket was then closed in 2 layers with 2.0 Vicryl suture for the subcutaneous and subcuticular layers. Steri- strips and a sterile  dressing were then applied.  There were no early apparent complications.   CONCLUSIONS:  1. Successful implantation of a St. Jude BiV pacemaker for symptomatic complete AV block  2. No early apparent complications.   Cristopher Peru, MD  6:25 PM 10/05/2013

## 2013-10-05 NOTE — Progress Notes (Signed)
UR completed Annaleese Guier K. Deaundra Dupriest, RN, BSN, MSHL, CCM  10/05/2013 4:45 PM

## 2013-10-05 NOTE — Progress Notes (Signed)
Ref: [name]   Subjective:  Claims to be asymptomatic but in bed with heart rate of 50's. Monitor shows atrial flutter with high grade AV block. H/O SVT and 3rd degree heart block/Sinus arrest this admission improved with holding B-blocker, Diltiazem and Lanoxin with  Minimal CAD and dilated cardiomyopathy with EF 35-40 %.  Objective:  Vital Signs in the last 24 hours: Temp:  [98.2 F (36.8 C)-98.7 F (37.1 C)] 98.7 F (37.1 C) (08/20 0712) Pulse Rate:  [50-90] 50 (08/20 1000) Cardiac Rhythm:  [-] Atrial flutter (08/20 0745) Resp:  [18] 18 (08/20 0712) BP: (101-112)/(55-77) 101/55 mmHg (08/20 1000) SpO2:  [96 %-100 %] 96 % (08/20 0712) Weight:  [66.271 kg (146 lb 1.6 oz)] 66.271 kg (146 lb 1.6 oz) (08/20 7616)  Physical Exam: BP Readings from Last 1 Encounters:  10/05/13 101/55    Wt Readings from Last 1 Encounters:  10/05/13 66.271 kg (146 lb 1.6 oz)    Weight change:   HEENT: [place]/AT, Eyes-Brown, PERL, EOMI, Conjunctiva-Pink, Sclera-Non-icteric Neck: No JVD, No bruit, Trachea midline. Lungs:  Clear, Bilateral. Cardiac:  Irregular rhythm, normal S1 and S2, no S3. II/VI systolic murmur. Abdomen:  Soft, non-tender. Extremities:  No edema present. No cyanosis. No clubbing. CNS: AxOx3, Cranial nerves grossly intact, moves all 4 extremities. Right handed. Skin: Warm and dry.   Intake/Output from previous day: 08/19 0701 - 08/20 0700 In: 1225 [P.O.:900; I.V.:325] Out: 200 [Urine:200]    Lab Results: BMET    Component Value Date/Time   NA 137 10/05/2013 0530   NA 136* 10/04/2013 0328   NA 140 10/03/2013 0249   K 4.1 10/05/2013 0530   K 4.1 10/04/2013 0328   K 4.0 10/03/2013 0249   CL 103 10/05/2013 0530   CL 100 10/04/2013 0328   CL 106 10/03/2013 0249   CO2 24 10/05/2013 0530   CO2 24 10/04/2013 0328   CO2 22 10/03/2013 0249   GLUCOSE 119* 10/05/2013 0530   GLUCOSE 94 10/04/2013 0328   GLUCOSE 91 10/03/2013 0249   BUN 15 10/05/2013 0530   BUN 18 10/04/2013 0328    BUN 24* 10/03/2013 0249   CREATININE 1.23 10/05/2013 0530   CREATININE 1.43* 10/04/2013 0328   CREATININE 1.49* 10/03/2013 0249   CALCIUM 9.1 10/05/2013 0530   CALCIUM 8.6 10/04/2013 0328   CALCIUM 8.9 10/03/2013 0249   GFRNONAA 61* 10/05/2013 0530   GFRNONAA 51* 10/04/2013 0328   GFRNONAA 48* 10/03/2013 0249   GFRAA 70* 10/05/2013 0530   GFRAA 59* 10/04/2013 0328   GFRAA 56* 10/03/2013 0249   CBC    Component Value Date/Time   WBC 5.0 10/05/2013 0530   RBC 2.83* 10/05/2013 0530   HGB 9.7* 10/05/2013 0530   HCT 27.5* 10/05/2013 0530   PLT 136* 10/05/2013 0530   MCV 97.2 10/05/2013 0530   MCH 34.3* 10/05/2013 0530   MCHC 35.3 10/05/2013 0530   RDW 11.4* 10/05/2013 0530   LYMPHSABS 1.6 09/30/2013 1301   MONOABS 0.5 09/30/2013 1301   EOSABS 0.2 09/30/2013 1301   BASOSABS 0.1 09/30/2013 1301   HEPATIC Function Panel  Recent Labs  08/23/13 0118 08/24/13 0305 09/30/13 1301  PROT 6.9 6.1 7.1   HEMOGLOBIN A1C No components found with this basename: HGA1C,  MPG   CARDIAC ENZYMES Lab Results  Component Value Date   CKTOTAL 65 09/08/2011   CKMB 3.5 09/08/2011   TROPONINI <0.30 08/23/2013   TROPONINI <0.30 08/23/2013   TROPONINI <0.30 08/23/2013   BNP  Recent Labs  08/22/13 2223 09/30/13 1301 10/01/13 0330  PROBNP 1416.0* 1528.0* 2028.0*   TSH  Recent Labs  08/23/13 0118  TSH 1.440   CHOLESTEROL No results found for this basename: CHOL,  in the last 8760 hours  Scheduled Meds: . sodium chloride   Intravenous Once  . allopurinol  100 mg Oral Daily  . carvedilol  25 mg Oral BID WC  . colchicine  0.6 mg Oral Daily  . digoxin  0.0625 mg Oral Daily  . diltiazem  120 mg Oral Q12H  . edoxaban  30 mg Oral Q24H  . pantoprazole  40 mg Oral Daily  . pneumococcal 23 valent vaccine  0.5 mL Intramuscular Tomorrow-1000  . spironolactone  12.5 mg Oral Daily   Continuous Infusions:  PRN Meds:.acetaminophen, docusate sodium, LORazepam, ondansetron (ZOFRAN) IV,  oxyCODONE  Assessment/Plan: Syncope  Tachy-brady syndrome. Acute on chronic left heart systolic failure  Acute respiratory failure  Acute on chronic renal failure (improving off Ace inhibitor) Severe bradycardia/3rd degree heart block  Severe hyperkalemia -resolved  Dilated cardiomyopathy  S/P SVT ablation x 2.  Alcohol use disorder  H/O atrial flutter  Digitalis toxicity ruled out  Dehydration-improving  EP consult/Dr. Lovena Le will evaluate for possible pacemaker and consider upgrade to Pacemaker ICD when LV function deteriorates further.    LOS: 5 days    Dixie Dials  MD  10/05/2013, 1:41 PM

## 2013-10-06 ENCOUNTER — Inpatient Hospital Stay (HOSPITAL_COMMUNITY): Payer: Medicare HMO

## 2013-10-06 LAB — CBC
HCT: 28.8 % — ABNORMAL LOW (ref 39.0–52.0)
Hemoglobin: 9.8 g/dL — ABNORMAL LOW (ref 13.0–17.0)
MCH: 33.8 pg (ref 26.0–34.0)
MCHC: 34 g/dL (ref 30.0–36.0)
MCV: 99.3 fL (ref 78.0–100.0)
Platelets: 84 10*3/uL — ABNORMAL LOW (ref 150–400)
RBC: 2.9 MIL/uL — AB (ref 4.22–5.81)
RDW: 12 % (ref 11.5–15.5)
WBC: 4.6 10*3/uL (ref 4.0–10.5)

## 2013-10-06 MED ORDER — LISINOPRIL 10 MG PO TABS
10.0000 mg | ORAL_TABLET | Freq: Every day | ORAL | Status: DC
  Administered 2013-10-06 – 2013-10-07 (×2): 10 mg via ORAL
  Filled 2013-10-06 (×2): qty 1

## 2013-10-06 NOTE — Progress Notes (Signed)
Patient complaining of left chest wall surgical pain at site of incision s/p pacemaker placement. Medicated with 325 mg of tylenol as per MD order. Will continue to monitor.  Esperanza Heir, RN

## 2013-10-06 NOTE — Care Management Note (Addendum)
  Page 1 of 1   10/06/2013     4:57:11 PM CARE MANAGEMENT NOTE 10/06/2013  Patient:  [name], [name]   Account Number:  [id number]  Date Initiated:  10/06/2013  Documentation initiated by:  Rashunda Passon  Subjective/Objective Assessment:   stemi, heart block     Action/Plan:   CM to follow for disposition needs   Anticipated DC Date:  10/07/2013   Anticipated DC Plan:  HOME/SELF CARE         Choice offered to / List presented to:             Status of service:  Completed, signed off Medicare Important Message given?  YES (If response is "NO", the following Medicare IM given date fields will be blank) Date Medicare IM given:  10/05/2013 Medicare IM given by:  Dakotah Orrego Date Additional Medicare IM given:   Additional Medicare IM given by:    Discharge Disposition:  HOME/SELF CARE  Per UR Regulation:  Reviewed for med. necessity/level of care/duration of stay  If discussed at Long Length of Stay Meetings, dates discussed:    Comments:  Gwenetta Devos RN, BSN, MSHL, CCM  Nurse - Case Manager,  ([facility] [place])  [phone]  10/06/2013 Successful implantation of a St. Jude BiV pacemaker for symptomatic complete AV block  on 10/05/2013 Social:  From home with family PCP:  Dr. Nyoka Cowden,  [facility], [place], [place]:  None Disposition Plan:  Home / [place]

## 2013-10-06 NOTE — Progress Notes (Signed)
Subjective:  Appreciate Dr. Tanna Furry consult and help.  Patient complains of mild soreness at pacemaker site.  Denies any anginal chest pain or shortness of breath.  Tolerated insertion of pacemaker yesterday   Objective:  Vital Signs in the last 24 hours: Temp:  [97.8 F (36.6 C)-98.4 F (36.9 C)] 98.2 F (36.8 C) (08/21 0830) Pulse Rate:  [67-96] 86 (08/21 1058) Resp:  [16-20] 18 (08/21 0830) BP: (122-146)/(63-99) 126/75 mmHg (08/21 1058) SpO2:  [95 %-100 %] 99 % (08/21 0830) Weight:  [66.361 kg (146 lb 4.8 oz)] 66.361 kg (146 lb 4.8 oz) (08/21 0455)  Intake/Output from previous day: 08/20 0701 - 08/21 0700 In: 840 [P.O.:840] Out: 1200 [Urine:1200] Intake/Output from this shift: Total I/O In: -  Out: 500 [Urine:500]  Physical Exam: Neck: no adenopathy, no carotid bruit, no JVD and supple, symmetrical, trachea midline Lungs: clear to auscultation bilaterally Heart: regular rate and rhythm, S1, S2 normal and soft systolic murmur noted Abdomen: soft, non-tender; bowel sounds normal; no masses,  no organomegaly Extremities: extremities normal, atraumatic, no cyanosis or edema  Lab Results:  Recent Labs  10/05/13 0530  WBC 5.0  HGB 9.7*  PLT 136*    Recent Labs  10/04/13 0328 10/05/13 0530  NA 136* 137  K 4.1 4.1  CL 100 103  CO2 24 24  GLUCOSE 94 119*  BUN 18 15  CREATININE 1.43* 1.23   No results found for this basename: TROPONINI, CK, MB,  in the last 72 hours Hepatic Function Panel No results found for this basename: PROT, ALBUMIN, AST, ALT, ALKPHOS, BILITOT, BILIDIR, IBILI,  in the last 72 hours No results found for this basename: CHOL,  in the last 72 hours No results found for this basename: PROTIME,  in the last 72 hours  Imaging: Imaging results have been reviewed and Dg Chest 2 View  10/06/2013   CLINICAL DATA:  Status post pacemaker placement.  Chest soreness.  EXAM: CHEST  2 VIEW  COMPARISON:  10/01/2013  FINDINGS: Endotracheal and enteric tubes  have been removed. There has been interval left-sided 3 lead pacemaker placement, with leads terminating over the right atrium, right ventricle, and left ventricle via the coronary sinus. Cardiac silhouette is mildly enlarged. Numerous tiny nodular densities throughout the lungs are compatible with prior granulomatous infection. Linear opacity in the left mid to lower lung likely reflects subsegmental atelectasis. No confluent airspace opacity, pulmonary edema, pleural effusion, or pneumothorax is identified. No acute osseous abnormality is identified.  IMPRESSION: Status post pacemaker placement. Left mid to lower lung subsegmental atelectasis.   Electronically Signed   By: Logan Bores   On: 10/06/2013 08:14    Cardiac Studies:  Assessment/Plan:  Status post symptomatic tachybradycardia syndrome with complete heart block status post temporary pacemaker followed by CRTP. Mild coronary artery disease Nonischemic cardiomyopathy Hypertension Status post acute renal insufficiency Plan DC Cardizem in view of depressed LV systolic function Add ace inhibitors as per orders Increase ambulation Will DC home tomorrow if stable Check labs in a.m.    LOS: 6 days    Ardith Lewman N 10/06/2013, 11:05 AM

## 2013-10-06 NOTE — Discharge Instructions (Signed)
° °  Supplemental Discharge Instructions for  Pacemaker/Defibrillator Patients  Activity No heavy lifting or vigorous activity with your left/right arm for 6 to 8 weeks.  Do not raise your left/right arm above your head for one week.  Gradually raise your affected arm as drawn below.                       08/24                   08/25                    08/26                    08/27            NO DRIVING for  one week    ; you may begin driving on     85/27     . WOUND CARE   Keep the wound area clean and dry.  Do not get this area wet for one week. No showers for one week; you may shower on      08/28        .   The tape/steri-strips on your wound will fall off; do not pull them off.  No bandage is needed on the site.  DO  NOT apply any creams, oils, or ointments to the wound area.   If you notice any drainage or discharge from the wound, any swelling or bruising at the site, or you develop a fever > 101? F after you are discharged home, call the office at once.  Special Instructions   You are still able to use cellular telephones; use the ear opposite the side where you have your pacemaker/defibrillator.  Avoid carrying your cellular phone near your device.   When traveling through airports, show security personnel your identification card to avoid being screened in the metal detectors.  Ask the security personnel to use the hand wand.   Avoid arc welding equipment, MRI testing (magnetic resonance imaging), TENS units (transcutaneous nerve stimulators).  Call the office for questions about other devices.   Avoid electrical appliances that are in poor condition or are not properly grounded.   Microwave ovens are safe to be near or to operate.  Additional information for defibrillator patients should your device go off:   If your device goes off ONCE and you feel fine afterward, notify the device clinic nurses.   If your device goes off ONCE and you do not feel well afterward, call 911.   If your device goes off TWICE, call 911.   If your device goes off THREE times in one day, call 911.  DO NOT DRIVE YOURSELF OR A FAMILY MEMBER WITH A DEFIBRILLATOR TO THE HOSPITAL--CALL 911.

## 2013-10-06 NOTE — Progress Notes (Signed)
Hold scheduled edoxaban per MD orders and order STAT lab draw. Will continue to monitor patient for further changes in condition.

## 2013-10-06 NOTE — Progress Notes (Signed)
Report given to receiving RN. Patient in bed resting. No verbal complaints and no signs or symptoms of distress or discomfort noted.  

## 2013-10-06 NOTE — Progress Notes (Signed)
ELECTROPHYSIOLOGY ROUNDING NOTE    Patient Name: [name] Date of Encounter: 10/06/2013    SUBJECTIVE:Patient feels well this morning.  No chest pain or shortness of breath.  S/p CRTP implant 10-05-13  TELEMETRY: Reviewed telemetry pt in nsr with BiV pacing Filed Vitals:   10/05/13 2211 10/06/13 0005 10/06/13 0200 10/06/13 0455  BP: 146/63 133/83 125/73 142/99  Pulse: 87 94 92 88  Temp: 98.3 F (36.8 C) 98.3 F (36.8 C) 97.8 F (36.6 C) 98.2 F (36.8 C)  TempSrc: Oral Oral Oral Oral  Resp: 18 16 18 18   Height:      Weight:    146 lb 4.8 oz (66.361 kg)  SpO2: 100% 95% 97% 97%    Intake/Output Summary (Last 24 hours) at 10/06/13 0732 Last data filed at 10/06/13 0500  Gross per 24 hour  Intake    840 ml  Output   1050 ml  Net   -210 ml    CURRENT MEDICATIONS: . sodium chloride   Intravenous Once  . allopurinol  100 mg Oral Daily  . carvedilol  25 mg Oral BID WC  . colchicine  0.6 mg Oral Daily  . digoxin  0.0625 mg Oral Daily  . diltiazem  120 mg Oral Q12H  . edoxaban  30 mg Oral Q24H  . pantoprazole  40 mg Oral Daily  . pneumococcal 23 valent vaccine  0.5 mL Intramuscular Tomorrow-1000  . spironolactone  12.5 mg Oral Daily    LABS: Basic Metabolic Panel:  Recent Labs  10/04/13 0328 10/05/13 0530  NA 136* 137  K 4.1 4.1  CL 100 103  CO2 24 24  GLUCOSE 94 119*  BUN 18 15  CREATININE 1.43* 1.23  CALCIUM 8.6 9.1   CBC:  Recent Labs  10/05/13 0530  WBC 5.0  HGB 9.7*  HCT 27.5*  MCV 97.2  PLT 136*    Radiology/Studies:  Dg Chest Port 1 View 10/01/2013   CLINICAL DATA:  Followup respiratory failure.  EXAM: PORTABLE CHEST - 1 VIEW  COMPARISON:  09/30/2013  FINDINGS: New nasogastric tube passes below the diaphragm into the distal stomach.  Endotracheal tube is stable in well positioned with its tip projecting 3.2 cm above the carina.  Heart, mediastinum hila are unremarkable.  Lungs are clear.  IMPRESSION: 1. No acute cardiopulmonary disease.  Support apparatus well positioned.   Electronically Signed   By: Lajean Manes M.D.   On: 10/01/2013 08:44    Final result pending, leads in stable position.  PHYSICAL EXAM Well appearing NAD HEENT: Unremarkable,[place], AT Neck:  6 JVD, no thyromegally Back:  No CVA tenderness Lungs:  Clear with no wheezes, rales, or rhonchi, no hematoma over PPM pocket. HEART:  Regular rate rhythm, no murmurs, no rubs, no clicks Abd:  soft, positive bowel sounds, no organomegally, no rebound, no guarding Ext:  2 plus pulses, no edema, no cyanosis, no clubbing Skin:  No rashes no nodules Neuro:  CN II through XII intact, motor grossly intact   DEVICE INTERROGATION: Device interrogated by industry.  Lead values including impedence, sensing, threshold within normal values.    A/P 1. Symptomatic tachy brady with CHB, resulting in cardiac arrest 2. Atrial flutter (atypical) with a RVR and a slow VR 3. Chronic systolic heart failure 4. Acute renal insufficiency secondary to #1 5. S/p BiV PPM insertion. Rec: ok for discharge home from my perspective. Now that back up pacing is available, would uptitrate his beta blocker as blood pressure allows  with goal of 25-50 mg twice daily.    Routine follow-up scheduled (10 day wound check and 3 months with Dr Lovena Le)  Mikle Bosworth.D.

## 2013-10-07 LAB — BASIC METABOLIC PANEL
ANION GAP: 11 (ref 5–15)
BUN: 18 mg/dL (ref 6–23)
CHLORIDE: 100 meq/L (ref 96–112)
CO2: 24 meq/L (ref 19–32)
CREATININE: 1.43 mg/dL — AB (ref 0.50–1.35)
Calcium: 9.4 mg/dL (ref 8.4–10.5)
GFR calc Af Amer: 59 mL/min — ABNORMAL LOW (ref >90)
GFR calc non Af Amer: 51 mL/min — ABNORMAL LOW (ref >90)
Glucose, Bld: 97 mg/dL (ref 70–99)
POTASSIUM: 4.5 meq/L (ref 3.7–5.3)
Sodium: 135 mEq/L — ABNORMAL LOW (ref 137–147)

## 2013-10-07 LAB — CBC
HEMATOCRIT: 29.5 % — AB (ref 39.0–52.0)
Hemoglobin: 10.2 g/dL — ABNORMAL LOW (ref 13.0–17.0)
MCH: 34.6 pg — ABNORMAL HIGH (ref 26.0–34.0)
MCHC: 34.6 g/dL (ref 30.0–36.0)
MCV: 100 fL (ref 78.0–100.0)
PLATELETS: 110 10*3/uL — AB (ref 150–400)
RBC: 2.95 MIL/uL — AB (ref 4.22–5.81)
RDW: 11.9 % (ref 11.5–15.5)
WBC: 3.3 10*3/uL — AB (ref 4.0–10.5)

## 2013-10-07 LAB — GLUCOSE, CAPILLARY: Glucose-Capillary: 92 mg/dL (ref 70–99)

## 2013-10-07 MED ORDER — CARVEDILOL 25 MG PO TABS: 25.0000 mg | ORAL_TABLET | Freq: Two times a day (BID) | ORAL | Status: DC

## 2013-10-07 NOTE — Progress Notes (Signed)
ANTICOAGULATION CONSULT NOTE - Follow Up Consult  Pharmacy Consult for Edoxaban [facility]) Indication: atrial fibrillation  No Known Allergies  Patient Measurements: Height: 5\' 9"  (175.3 cm) Weight: 144 lb 6.4 oz (65.499 kg) (c scale) IBW/kg (Calculated) : 70.7 Heparin Dosing Weight: n/a  Vital Signs: Temp: 98.8 F (37.1 C) (08/22 0641) Temp src: Oral (08/22 0641) BP: 100/67 mmHg (08/22 0641) Pulse Rate: 81 (08/22 0641)  Labs:  Recent Labs  10/05/13 0530 10/06/13 1340 10/07/13 0347  HGB 9.7* 9.8* 10.2*  HCT 27.5* 28.8* 29.5*  PLT 136* 84* 110*  CREATININE 1.23  --  1.43*    Estimated Creatinine Clearance: 49 ml/min (by C-G formula based on Cr of 1.43).   Medical History: Past Medical History  Diagnosis Date  . Hypertension   . GERD (gastroesophageal reflux disease)   . Hiatal hernia   . CHF (congestive heart failure)   . Cardiomyopathy     nonischemic   . COPD (chronic obstructive pulmonary disease)     previously listed in DC summary from Feb 2012   . Chronic kidney disease (CKD), stage II (mild)     listed in chart although patient denies   . Atrial fibrillation      previously listed in DC summary from August 31, 2011, ? atrial tachycardia   . SVT (supraventricular tachycardia)   . Heart murmur     "dx'd in middle school" (12/22/2012)  . Pneumonia     "once when I was a baby; probably 3-4 times since then" (12/22/2012)  . Shortness of breath     "all the time last couple months" (12/22/2012)  . WEXHBZJI(967.8)     "weekly" (01/01/2013)  . Arthritis     "neck" (12/22/2012)  . Gout   . Depression     Medications:  Prescriptions prior to admission  Medication Sig Dispense Refill  . allopurinol (ZYLOPRIM) 100 MG tablet Take 100 mg by mouth daily.       . carvedilol (COREG) 6.25 MG tablet Take 6.25 mg by mouth 2 (two) times daily with a meal.      . colchicine 0.6 MG tablet Take 0.6 mg by mouth 3 (three) times a week.       . digoxin (LANOXIN) 0.125 MG  tablet Take 1 tablet (0.125 mg total) by mouth daily.  30 tablet  3  . diltiazem (CARDIZEM) 120 MG tablet Take 120 mg by mouth 2 (two) times daily.      . Edoxaban Tosylate (SAVAYSA) 30 MG TABS Take 30 mg by mouth daily.       . furosemide (LASIX) 40 MG tablet Take 40 mg by mouth daily.       Marland Kitchen lisinopril (PRINIVIL,ZESTRIL) 20 MG tablet Take 0.5 tablets (10 mg total) by mouth daily.      . Multiple Vitamin (MULTIVITAMIN WITH MINERALS) TABS tablet Take 1 tablet by mouth daily.      Marland Kitchen oxyCODONE (OXY IR/ROXICODONE) 5 MG immediate release tablet Take 5 mg by mouth 2 (two) times daily as needed for moderate pain or severe pain.      . pantoprazole (PROTONIX) 40 MG tablet Take 40 mg by mouth daily as needed (for heartburn).       . potassium chloride SA (K-DUR,KLOR-CON) 20 MEQ tablet Take 10 mEq by mouth daily.       . rosuvastatin (CRESTOR) 20 MG tablet Take 10 mg by mouth every Monday, Wednesday, and Friday.      Marland Kitchen spironolactone (ALDACTONE) 12.5 mg TABS tablet  Take 0.5 tablets (12.5 mg total) by mouth daily.  30 tablet  0    Assessment: 63 yo male on chronic anticoagulation for afib with edoxaban.  PTA was taking reduced dose of edoxaban 30 mg daily due to CrCl < 50.  He is s/p pacemaker insertion on 8/21. His Scr has trended down to 1.43 today and estimated CrCl ~ 48 ml/min. H/H and plt have trended up slightly.   Goal of Therapy:  Therapeutic anticoagulation Monitor platelets by anticoagulation protocol: Yes   Plan:  1. Given recent history of unstable renal fx and CrCl < 50 mL/min, recommend continuing on home edoxaban dose for now    Albertina Parr, PharmD.  Clinical Pharmacist Pager [phone]

## 2013-10-07 NOTE — Progress Notes (Signed)
Patient is resting peacefully, denies complaint of pain or discomfort. Will continue to monitor.  Esperanza Heir, RN

## 2013-10-07 NOTE — Discharge Summary (Signed)
[name], [name]                 ACCOUNT NO.:  [id number]  MEDICAL RECORD NO.:  [id number]  LOCATION:  3E12C                        FACILITY:  [facility]  PHYSICIAN:  Chermaine Schnyder N. Terrence Dupont, M.D. DATE OF BIRTH:  [birth year]  DATE OF ADMISSION:  09/30/2013 DATE OF DISCHARGE:  10/07/2013                              DISCHARGE SUMMARY   ADMITTING DIAGNOSES: 1. Syncope. 2. Acute on chronic left heart systolic failure. 3. Acute respiratory failure. 4. Acute on chronic renal failure. 5. Severe bradycardia. 6. Severe hyperkalemia. 7. Dilated cardiomyopathy. 8. Status post supraventricular tachycardia ablation x2. 9. History of alcohol abuse. 10.History of atrial flutter. 11.Possible digitoxicity. 12.Possible dehydration.  FINAL DIAGNOSES: 1. 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 2. Mild coronary artery disease. 3. Nonischemic cardiomyopathy. 4. Hypertension. 5. Status post acute renal insufficiency. 6. Status post hyperkalemia. 7. Status post dehydration. 8. Status post supraventricular tachycardia ablation x2 in the past. 9. History of atrial flutter in the past. 10.History of alcohol abuse. 11.Status post acute respiratory failure.  DISCHARGE HOME MEDICATIONS: 1. Carvedilol 25 mg 1 tablet twice daily. 2. Allopurinol 100 mg 1 tablet daily. 3. Colchicine 0.6 mg 3 times per week. 4. Lasix 40 mg 1 tablet daily. 5. Lisinopril 10 mg daily. 6. Multivitamin 1 tablet daily. 7. Oxycodone 5 mg twice daily as needed for pain. 8. Protonix 40 mg 1 tablet daily. 9. Crestor 20 mg daily. 10.Celexa 30 mg daily. 11.Aldactone 12.5 mg daily. The patient has been advised to stop digoxin, diltiazem, and potassium chloride.  FOLLOWUP:  With [name] in 2 weeks.  Follow up with [facility] as scheduled.  CONDITION AT DISCHARGE:  Stable.  BRIEF HISTORY AND HOSPITAL COURSE:   [name] is a 63 year old male. He was at a funeral home, collapsed, was diaphoretic, heart rate in 20s when EMS arrived.  The patient was placed on transcutaneous pacer with heart rate in 80s.  The patient lost capture when arrived to the ER, placed again on the transcutaneous pacer, and was emergently taken to the cath lab.  The patient was electively intubated in the ED.  The patient had cardiac cath in the past, which showed no significant coronary artery disease.  Temporary pacemaker was placed in the ED.  The patient also was noted to be hyperkalemic with potassium of 7.3.  PAST MEDICAL HISTORY: 1. History of hypertension. 2. GERD. 3. History of congestive heart failure. 4. Nonischemic cardiomyopathy. 5. COPD. 6. History of chronic kidney disease. 7. History of atrial flutter. 8. History of SVT status post ablation in the past. 9. History of gout. 10.Depression.  PAST SURGICAL HISTORY:  He had TEE cardioversion in March 2014 and cardiac cath in July 2013.  PHYSICAL EXAMINATION:  VITAL SIGNS:  His blood pressure was 172/139, pulse was 91. EYES:  Conjunctivae pink. NECK:  Supple.  No JVD. LUNGS:  Has fine crackles bilaterally. CARDIOVASCULAR:  S1 and S2 was normal.  Regular rhythm.  2/6 systolic murmur at the left sternal border. ABDOMEN:  Bowel sounds were present. EXTREMITIES:  There is no clubbing, cyanosis,  or edema.  LABORATORY DATA:  Sodium was 131, potassium 7.8, BUN 62, creatinine 3.70.  His  proBNP was 1528.  Hemoglobin was 11.9, hematocrit 35.3, white count of 7.9.  His last electrolytes; sodium 135, potassium 4.5, BUN 18, creatinine 1.43.  Hemoglobin 10.2, hematocrit 29.5, white count of 3.3, which has been stable.  EKG done on October 02, 2013, showed atypical atrial flutter with 2:1 conduction with ST-T wave changes in lateral leads.  BRIEF HOSPITAL COURSE:  The patient was emergently taken from the ED to the cath lab and subsequently underwent left  cardiac cath with selective left and right coronary angiography and insertion of temporary transvenous pacemaker.  His digoxin and calcium channel blockers were held initially with improvement in his heart rate. Temporary pacemaker was discontinued.  The patient had frequent episodes of atrial flutter with 2:1 conduction and then marked bradycardia, subsequently required EP consultation, and insertion of permanent CRT BiV pacemaker.  The patient tolerated the procedure well.  The patient did not have any further episodes of tachycardia.  His beta-blocker dose has been increased, which he is tolerating well.  Calcium channel blockers were discontinued in view of depressed LV systolic function.  The patient's pacemaker site appears to be dry. The patient is ambulating in hallway without any problems.  The patient will be discharged home on above medications and will be followed by [name] in 2 weeks and with EP [facility] as scheduled.     [name], M.D.     MNH/MEDQ  D:  10/07/2013  T:  10/07/2013  Job:  [id number]  cc:   Birdie Riddle, M.D.

## 2013-10-07 NOTE — Discharge Summary (Signed)
  Discharge summary dictated on 10/07/2013 dictation number is [id number]

## 2013-10-18 ENCOUNTER — Ambulatory Visit (INDEPENDENT_AMBULATORY_CARE_PROVIDER_SITE_OTHER): Payer: Commercial Managed Care - HMO | Admitting: *Deleted

## 2013-10-18 ENCOUNTER — Ambulatory Visit (HOSPITAL_COMMUNITY)
Admission: RE | Admit: 2013-10-18 | Discharge: 2013-10-18 | Disposition: A | Discharge status: Home / Self Care | Payer: Medicare HMO | Source: Ambulatory Visit | Attending: Internal Medicine | Admitting: Internal Medicine

## 2013-10-18 DIAGNOSIS — I429 Cardiomyopathy, unspecified: Secondary | ICD-10-CM

## 2013-10-18 DIAGNOSIS — Z95810 Presence of automatic (implantable) cardiac defibrillator: Secondary | ICD-10-CM | POA: Insufficient documentation

## 2013-10-18 DIAGNOSIS — R079 Chest pain, unspecified: Secondary | ICD-10-CM | POA: Insufficient documentation

## 2013-10-18 DIAGNOSIS — I428 Other cardiomyopathies: Secondary | ICD-10-CM

## 2013-10-18 LAB — MDC_IDC_ENUM_SESS_TYPE_INCLINIC
Battery Remaining Longevity: 79.2 mo
Battery Voltage: 2.99 V
Brady Statistic RA Percent Paced: 0 %
Brady Statistic RV Percent Paced: 100 %
Date Time Interrogation Session: 20150902170937
Implantable Pulse Generator Model: 3242
Implantable Pulse Generator Serial Number: 7614436
Lead Channel Impedance Value: 450 Ohm
Lead Channel Impedance Value: 525 Ohm
Lead Channel Impedance Value: 550 Ohm
Lead Channel Pacing Threshold Amplitude: 0.5 V
Lead Channel Pacing Threshold Amplitude: 0.875 V
Lead Channel Pacing Threshold Amplitude: 1.25 V
Lead Channel Pacing Threshold Amplitude: 1.25 V
Lead Channel Pacing Threshold Pulse Width: 0.4 ms
Lead Channel Pacing Threshold Pulse Width: 0.4 ms
Lead Channel Pacing Threshold Pulse Width: 0.8 ms
Lead Channel Pacing Threshold Pulse Width: 0.8 ms
Lead Channel Sensing Intrinsic Amplitude: 4.4 mV
Lead Channel Sensing Intrinsic Amplitude: 9.7 mV
Lead Channel Setting Pacing Amplitude: 2 V
Lead Channel Setting Pacing Amplitude: 2 V
Lead Channel Setting Pacing Amplitude: 2.5 V
Lead Channel Setting Pacing Pulse Width: 0.4 ms
Lead Channel Setting Pacing Pulse Width: 0.8 ms
Lead Channel Setting Sensing Sensitivity: 2 mV

## 2013-10-18 NOTE — Progress Notes (Signed)
Wound check appointment. Steri-strips removed. Wound without redness or edema. Incision edges approximated, wound well healed. Normal device function. Thresholds, sensing, and impedances consistent with implant measurements.  LV threshold increased along with impedance, reprogrammed Mid 2-> can.  CXR to be done.   Device programmed at 3.5V/auto capture programmed on for extra safety margin until 3 month visit. Histogram distribution appropriate for patient and level of activity. 8  mode switches with 1 HVR during A-fib, + edoxaban.  Total burden 1%.   Patient educated about wound care, arm mobility, lifting restrictions. ROV in 3 months with implanting physician.

## 2013-11-06 ENCOUNTER — Encounter: Payer: Self-pay | Admitting: Internal Medicine

## 2013-11-22 ENCOUNTER — Encounter: Payer: Self-pay | Admitting: Internal Medicine

## 2013-12-07 ENCOUNTER — Encounter: Payer: Self-pay | Admitting: Internal Medicine

## 2013-12-13 ENCOUNTER — Telehealth: Payer: Self-pay | Admitting: *Deleted

## 2013-12-13 ENCOUNTER — Encounter: Payer: Self-pay | Admitting: Internal Medicine

## 2013-12-13 NOTE — Telephone Encounter (Signed)
Spoke to pt about abn Corvue. He says that his brother died recently and while he was out pf town for the funeral he wasn't eating appropriate foods.  He noticed a slight weight gain and bloating that has since resolved. Overall pt says that he has not had any issues. Patient encouraged to call if problems arise before his Nov appt w/GT.

## 2013-12-16 ENCOUNTER — Encounter: Payer: Self-pay | Admitting: Internal Medicine

## 2013-12-24 ENCOUNTER — Encounter: Payer: Self-pay | Admitting: Internal Medicine

## 2014-01-02 ENCOUNTER — Encounter: Payer: Self-pay | Admitting: Internal Medicine

## 2014-01-08 ENCOUNTER — Encounter: Payer: Self-pay | Admitting: Internal Medicine

## 2014-01-09 ENCOUNTER — Ambulatory Visit (INDEPENDENT_AMBULATORY_CARE_PROVIDER_SITE_OTHER): Payer: Commercial Managed Care - HMO | Admitting: Internal Medicine

## 2014-01-09 ENCOUNTER — Encounter: Payer: Self-pay | Admitting: Internal Medicine

## 2014-01-09 VITALS — BP 100/70 | HR 98 | Ht 69.0 in | Wt 150.8 lb

## 2014-01-09 DIAGNOSIS — I429 Cardiomyopathy, unspecified: Secondary | ICD-10-CM

## 2014-01-09 DIAGNOSIS — Z950 Presence of cardiac pacemaker: Secondary | ICD-10-CM

## 2014-01-09 DIAGNOSIS — I469 Cardiac arrest, cause unspecified: Secondary | ICD-10-CM

## 2014-01-09 DIAGNOSIS — I5023 Acute on chronic systolic (congestive) heart failure: Secondary | ICD-10-CM

## 2014-01-09 DIAGNOSIS — I459 Conduction disorder, unspecified: Secondary | ICD-10-CM

## 2014-01-09 DIAGNOSIS — I5022 Chronic systolic (congestive) heart failure: Secondary | ICD-10-CM

## 2014-01-09 DIAGNOSIS — I483 Typical atrial flutter: Secondary | ICD-10-CM

## 2014-01-09 LAB — MDC_IDC_ENUM_SESS_TYPE_INCLINIC
Battery Remaining Longevity: 70.8 mo
Battery Voltage: 2.99 V
Brady Statistic RV Percent Paced: 94 %
Lead Channel Impedance Value: 362.5 Ohm
Lead Channel Impedance Value: 487.5 Ohm
Lead Channel Pacing Threshold Pulse Width: 0.4 ms
Lead Channel Setting Pacing Amplitude: 2 V
Lead Channel Setting Pacing Amplitude: 2.5 V
Lead Channel Setting Pacing Pulse Width: 0.4 ms
Lead Channel Setting Pacing Pulse Width: 0.8 ms
MDC IDC MSMT LEADCHNL RA PACING THRESHOLD AMPLITUDE: 0.75 V
MDC IDC MSMT LEADCHNL RA PACING THRESHOLD PULSEWIDTH: 0.4 ms
MDC IDC MSMT LEADCHNL RA SENSING INTR AMPL: 4.3 mV
MDC IDC MSMT LEADCHNL RV IMPEDANCE VALUE: 512.5 Ohm
MDC IDC MSMT LEADCHNL RV PACING THRESHOLD AMPLITUDE: 0.75 V
MDC IDC MSMT LEADCHNL RV SENSING INTR AMPL: 11.5 mV
MDC IDC PG MODEL: 3242
MDC IDC PG SERIAL: 7614436
MDC IDC SESS DTM: 20151124111144
MDC IDC SET LEADCHNL RA PACING AMPLITUDE: 2 V
MDC IDC SET LEADCHNL RV SENSING SENSITIVITY: 2 mV
MDC IDC STAT BRADY RA PERCENT PACED: 11 %

## 2014-01-09 NOTE — Assessment & Plan Note (Signed)
His [employer] Jude biventricular pacemaker is working normally. We'll plan to recheck in several months. 

## 2014-01-09 NOTE — Assessment & Plan Note (Signed)
His chronic systolic heart failure is class II. He will continue his current medical therapy. I've encouraged the patient to continue his medications, maintain a low-sodium diet.

## 2014-01-09 NOTE — Progress Notes (Signed)
HPI Mr. [name] returns today for followup. He is a very pleasant 63 year old man with a nonischemic cardiomyopathy and SVT.  He has not had palpitations. No edema. He underwent biventricular pacemaker insertion back in August. In the interim, he has done well, except for some residual tenderness at his pacemaker insertion site. He denies fever or chills, and no swelling. No Known Allergies   Current Outpatient Prescriptions  Medication Sig Dispense Refill  . allopurinol (ZYLOPRIM) 100 MG tablet Take 100 mg by mouth daily.     . carvedilol (COREG) 6.25 MG tablet Take 6.25 mg by mouth 2 (two) times daily.  5  . colchicine 0.6 MG tablet Take 0.6 mg by mouth daily.     . Edoxaban Tosylate (SAVAYSA) 30 MG TABS Take 30 mg by mouth daily.     . furosemide (LASIX) 40 MG tablet Take 40 mg by mouth daily.     Marland Kitchen lisinopril (PRINIVIL,ZESTRIL) 20 MG tablet Take 0.5 tablets (10 mg total) by mouth daily.    . Multiple Vitamin (MULTIVITAMIN WITH MINERALS) TABS tablet Take 1 tablet by mouth daily.    Marland Kitchen oxyCODONE (OXY IR/ROXICODONE) 5 MG immediate release tablet Take 5 mg by mouth 2 (two) times daily as needed for moderate pain or severe pain.    . pantoprazole (PROTONIX) 40 MG tablet Take 40 mg by mouth daily as needed (for heartburn).     . rosuvastatin (CRESTOR) 20 MG tablet Take 10 mg by mouth every Monday, Wednesday, and Friday.    Marland Kitchen spironolactone (ALDACTONE) 12.5 mg TABS tablet Take 0.5 tablets (12.5 mg total) by mouth daily. 30 tablet 0   No current facility-administered medications for this visit.     Past Medical History  Diagnosis Date  . Hypertension   . GERD (gastroesophageal reflux disease)   . Hiatal hernia   . CHF (congestive heart failure)   . Cardiomyopathy     nonischemic   . COPD (chronic obstructive pulmonary disease)     previously listed in DC summary from Feb 2012   . Chronic kidney disease (CKD), stage II (mild)     listed in chart although patient denies   . Atrial  fibrillation      previously listed in DC summary from August 31, 2011, ? atrial tachycardia   . SVT (supraventricular tachycardia)   . Heart murmur     "dx'd in middle school" (12/22/2012)  . Pneumonia     "once when I was a baby; probably 3-4 times since then" (12/22/2012)  . Shortness of breath     "all the time last couple months" (12/22/2012)  . IEPPIRJJ(884.1)     "weekly" (01/01/2013)  . Arthritis     "neck" (12/22/2012)  . Gout   . Depression     ROS:   All systems reviewed and negative except as noted in the HPI.   Past Surgical History  Procedure Laterality Date  . Tee without cardioversion N/A 05/11/2012    Procedure: TRANSESOPHAGEAL ECHOCARDIOGRAM (TEE);  Surgeon: Birdie Riddle, MD;  Location: [facility] ENDOSCOPY;  Service: Cardiovascular;  Laterality: N/A;  . Cardioversion N/A 05/11/2012    Procedure: Electrocardioversion, external;  Surgeon: Birdie Riddle, MD;  Location: [facility];  Service: Cardiovascular;  Laterality: N/A;  . Cardioversion N/A 05/11/2012    Procedure: CARDIOVERSION AT THE BEDSIDE;  Surgeon: Birdie Riddle, MD;  Location: [place];  Service: Cardiovascular;  Laterality: N/A;  . Supraventricular tachycardia ablation  08/2011; 09/2011  . Cardiac catheterization  08/2011  Family History  Problem Relation Age of Onset  . Lung cancer Mother     passed away from lung cancer  . Pneumonia Father     passed sway from PNA.  . Stroke Sister   . Diabetes Sister   . Seizures Brother   . Seizures Brother      History   Social History  . Marital Status: Single    Spouse Name: N/A    Number of Children: N/A  . Years of Education: N/A   Occupational History  . Not on file.   Social History Main Topics  . Smoking status: Former Smoker -- 0.25 packs/day for 15 years    Types: Cigarettes    Quit date: 07/17/2011  . Smokeless tobacco: Never Used  . Alcohol Use: 3.6 oz/week    6 Cans of beer per week     Comment: 12/22/2012 1, ~ 24oz beer q other day at  most"  . Drug Use: Yes    Special: Cocaine, Marijuana     Comment: 12/22/2012 "Cocaine and Marijuana in 1990's and stopped all drug abuse in 2003"  . Sexual Activity: Not Currently   Other Topics Concern  . Not on file   Social History Narrative     BP 100/70 mmHg  Pulse 98  Ht 5\' 9"  (1.753 m)  Wt 150 lb 12.8 oz (68.402 kg)  BMI 22.26 kg/m2  Physical Exam:  Well appearing middle-aged man, NAD HEENT: Unremarkable Neck:  7 cm JVD, no thyromegally Lungs:  Clear with no wheezes, rales, or rhonchi. Well healed PPM incision. HEART:  Regular rate rhythm, no murmurs, no rubs, no clicks Abd:  soft, positive bowel sounds, no organomegally, no rebound, no guarding Ext:  2 plus pulses, no edema, no cyanosis, no clubbing Skin:  No rashes no nodules Neuro:  CN II through XII intact, motor grossly intact   Assess/Plan:

## 2014-01-09 NOTE — Assessment & Plan Note (Signed)
He is maintaining NSR 99% of the time. He will continue his current medications.

## 2014-01-09 NOTE — Patient Instructions (Signed)
Your physician wants you to follow-up in: 9 months with Dr Knox Saliva will receive a reminder letter in the mail two months in advance. If you don't receive a letter, please call our office to schedule the follow-up appointment.  Remote monitoring is used to monitor your Pacemaker of ICD from home. This monitoring reduces the number of office visits required to check your device to one time per year. It allows [place] to keep an eye on the functioning of your device to ensure it is working properly. You are scheduled for a device check from home on 04/11/14. You may send your transmission at any time that day. If you have a wireless device, the transmission will be sent automatically. After your physician reviews your transmission, you will receive a postcard with your next transmission date.

## 2014-01-24 ENCOUNTER — Encounter (HOSPITAL_COMMUNITY): Payer: Self-pay | Admitting: Cardiovascular Disease

## 2014-01-24 ENCOUNTER — Encounter: Payer: Self-pay | Admitting: Internal Medicine

## 2014-01-25 ENCOUNTER — Encounter (HOSPITAL_COMMUNITY): Payer: Self-pay | Admitting: Internal Medicine

## 2014-01-31 ENCOUNTER — Encounter: Payer: Self-pay | Admitting: Physician Assistant

## 2014-02-13 ENCOUNTER — Ambulatory Visit (INDEPENDENT_AMBULATORY_CARE_PROVIDER_SITE_OTHER): Payer: Commercial Managed Care - HMO | Admitting: Physician Assistant

## 2014-02-13 ENCOUNTER — Encounter: Payer: Self-pay | Admitting: Cardiovascular Disease

## 2014-02-13 ENCOUNTER — Telehealth: Payer: Self-pay | Admitting: *Deleted

## 2014-02-13 ENCOUNTER — Encounter: Payer: Self-pay | Admitting: Physician Assistant

## 2014-02-13 VITALS — BP 102/72 | HR 64 | Ht 67.0 in | Wt 153.2 lb

## 2014-02-13 DIAGNOSIS — Z800 Family history of malignant neoplasm of digestive organs: Secondary | ICD-10-CM

## 2014-02-13 DIAGNOSIS — Z7901 Long term (current) use of anticoagulants: Secondary | ICD-10-CM

## 2014-02-13 DIAGNOSIS — Z1211 Encounter for screening for malignant neoplasm of colon: Secondary | ICD-10-CM

## 2014-02-13 DIAGNOSIS — I429 Cardiomyopathy, unspecified: Secondary | ICD-10-CM

## 2014-02-13 MED ORDER — MOVIPREP 100 G PO SOLR: 1.0000 | ORAL | Status: DC

## 2014-02-13 NOTE — Progress Notes (Signed)
Patient ID: [name], male   DOB: February 09, 1951, 63 y.o.   MRN: [id number]   Subjective:    Patient ID: [name], male    DOB: 1950/10/29, 63 y.o.   MRN: [id number]  HPI Male is a pleasant 63 year old African-American male who comes in today self referred for colon neoplasia screening. His primary physician is Dr. York Snyder cardiologist is Dr. Doylene Snyder. Patient has not had any prior GI evaluation. He says he has family history of colon cancer in an aunt who was diagnosed in her 43s. He has no current GI complaints, specifically no problems with abdominal pain and change in bowel habits melena or hematochezia. His appetite has been fine his weight has been stable. He does get occasional heartburn and indigestion and has a prescription for Protonix which she says he takes most days. He does have history of an nonischemic cardiomyopathy with most recent EF documented at 35% EF in February 2015 was 25-30%. He is status post pacemaker placement in August 2015 for heart block has history of atrial fib/ flutter, chronic kidney disease stage II, and SVT. He was just recently seen by Dr. Crissie Snyder who did the pacemaker placement and has been felt to be stable from a cardiac standpoint. He is currently on  Savasya for anticoagulation.  Review of Systems Pertinent positive and negative review of systems were noted in the above HPI section.  All other review of systems was otherwise negative.  Outpatient Encounter Prescriptions as of 02/13/2014  Medication Sig  . allopurinol (ZYLOPRIM) 100 MG tablet Take 100 mg by mouth daily.   . carvedilol (COREG) 6.25 MG tablet Take 6.25 mg by mouth 2 (two) times daily.  . colchicine 0.6 MG tablet Take 0.6 mg by mouth daily.   . Edoxaban Tosylate (SAVAYSA) 30 MG TABS Take 30 mg by mouth daily.   . furosemide (LASIX) 40 MG tablet Take 40 mg by mouth daily.   Marland Kitchen lisinopril (PRINIVIL,ZESTRIL) 20 MG tablet Take 0.5 tablets (10 mg total) by mouth daily.  . Multiple  Vitamin (MULTIVITAMIN WITH MINERALS) TABS tablet Take 1 tablet by mouth daily.  Marland Kitchen oxyCODONE (OXY IR/ROXICODONE) 5 MG immediate release tablet Take 5 mg by mouth 2 (two) times daily as needed for moderate pain or severe pain.  . pantoprazole (PROTONIX) 40 MG tablet Take 40 mg by mouth daily as needed (for heartburn).   . rosuvastatin (CRESTOR) 20 MG tablet Take 10 mg by mouth every Monday, Wednesday, and Friday.  Marland Kitchen spironolactone (ALDACTONE) 12.5 mg TABS tablet Take 0.5 tablets (12.5 mg total) by mouth daily.  Marland Kitchen MOVIPREP 100 G SOLR Take 1 kit (200 g total) by mouth as directed.   Allergies  Allergen Reactions  . Ibuprofen Other (See Comments)    Makes heart race   Patient Active Problem List   Diagnosis Date Noted  . Pacemaker 01/09/2014  . Cardiac arrest 09/30/2013  . Hyperkalemia 09/30/2013  . Acute kidney failure 09/30/2013  . Cardiomyopathy 09/30/2013  . Acute respiratory failure 09/30/2013  . AKI (acute kidney injury) 08/23/2013  . Heart block 08/23/2013  . Syncope 08/22/2013  . Atrial flutter 03/29/2013  . Acute on chronic left systolic heart failure 19/41/7408  . Chronic systolic heart failure 14/48/1856  . SVT (supraventricular tachycardia) 09/16/2011  . Pulmonary hypertension 09/11/2011  . 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 Cardiogenic shock 09/10/2011  . Cardiorenal syndrome 09/10/2011  . 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 Acute on chronic systolic heart failure 31/49/7026  Class: Acute  . Hypertension, benign 01/31/2011    Class: Chronic  . Dilated cardiomyopathy 01/31/2011    Class: Chronic  . Chronic renal insufficiency, stage II (mild) 01/31/2011    Class: Chronic   History   Social History  . Marital Status: Single    Spouse Name: N/A    Number of Children: 2  . Years of Education: N/A   Occupational History  . auto body Mechanic/painter    Social History Main Topics  . Smoking status: Former Smoker -- 0.25 packs/day  for 15 years    Types: Cigarettes    Quit date: 07/17/2011  . Smokeless tobacco: Never Used  . Alcohol Use: 3.6 oz/week    6 Cans of beer per week     Comment: 12/22/2012 1, ~ 24oz beer q other day at most"  . Drug Use: Yes    Special: Cocaine, Marijuana     Comment: 12/22/2012 "Cocaine and Marijuana in 1990's and stopped all drug abuse in 2003"  . Sexual Activity: Not Currently   Other Topics Concern  . Not on file   Social History Narrative    [name]'s family history includes Alcohol abuse in his father; Colon cancer in his maternal aunt; Colon polyps in his cousin; Diabetes in his sister; Heart failure in his mother; Liver cancer in his brother; Lung cancer in his mother; Other in his maternal aunt; Pneumonia in his father; Prostate cancer in his paternal grandfather; Seizures in his brother; Stroke in his sister.      Objective:    Filed Vitals:   02/13/14 0915  BP: 102/72  Pulse: 64    Physical Exam   well-developed African-American male in no acute distress, pleasant blood pressure 102/72 pulse 64 height is 5 foot 7 weight 153. HEENT; nontraumatic normocephalic EOMI PERRLA sclera anicteric, Cardiovascular; regular rate and rhythm with S1-S2 he does have a systolic murmur, Pulmonary; clear bilaterally, Abdomen; soft nontender nondistended bowel sounds are active there is no palpable mass or hepatosplenomegaly, Rectal ;exam not done, Extremities ;no clubbing cyanosis or edema skin ;warm and dry, Psych; mood and affect appropriate     Assessment & Plan:   #1 63 yo AA male here for colon neoplasia screening. He is asymptomatic, has not had any prior colon evaluation and has family history of colon cancer in an aunt. #2 chronic anticoagulation currently on Savasya #3 history of nonischemic cardiomyopathy EF 30-35%  #4 next number GERD #5 chronic kidney disease stage II #6 history of atrial fib/ flutter  #7  history of heart block status post pacemaker placement August  2015   Plan; patient is scheduled for colonoscopy with Dr. Ardis Snyder. This will be scheduled at [facility] given his cardiomyopathy and EF of 30-35%. Procedure was discussed in detail with the patient and he is agreeable to proceed. We will obtain consent from his cardiologist Dr. Doylene Snyder for patient to hold [facility] for 3-5 days prior to procedure. Amy Genia Harold PA-C 02/13/2014

## 2014-02-13 NOTE — Progress Notes (Signed)
i agree with the above note, plan 

## 2014-02-13 NOTE — Patient Instructions (Signed)
You have been scheduled for a colonoscopy. Please follow written instructions given to you at your visit today.  We have given you a sample colonoscopy prep.  If you use inhalers (even only as needed), please bring them with you on the day of your procedure. Your physician has requested that you go to www.startemmi.com and enter the access code given to you at your visit today. This web site gives a general overview about your procedure. However, you should still follow specific instructions given to you by our office regarding your preparation for the procedure.  We will contact you once we hear from [name].

## 2014-02-13 NOTE — Telephone Encounter (Signed)
I received a call from Dr. Dixie Dials. He was trying to route my message back to me but was having trouble.  He verbally explained that the patient can hold the Savaysa 3 days prior to the procedure date and 1 day after , which will be a total of 5 days.  He can resume it on 03-17-2014. I called the patient and go no answer, no answering device.

## 2014-02-13 NOTE — Telephone Encounter (Signed)
2/29/2015   RE: [name] DOB: 05/21/1950 MRN: [id number]   Dear Dr. Dixie Dials,    We have scheduled the above patient for an endoscopic procedure. Our records show that he is on anticoagulation therapy.   Please advise as to how long the patient may come off his therapy of Savaysa prior to the procedure, which is scheduled for 03-15-2014.  Please fax back/ or route the completed form to [place] at [phone]   Sincerely,    Amy [place]wood PA-C

## 2014-02-19 ENCOUNTER — Encounter: Payer: Self-pay | Admitting: Internal Medicine

## 2014-02-19 ENCOUNTER — Telehealth: Payer: Self-pay | Admitting: *Deleted

## 2014-02-19 NOTE — Telephone Encounter (Signed)
Attempted call--no answer//sss

## 2014-02-20 NOTE — Telephone Encounter (Signed)
Called and spoke to [name].  I gave him the [facility] instructions from [name].  Pt verbalized understanding of instructions.

## 2014-02-27 ENCOUNTER — Encounter: Payer: Self-pay | Admitting: Internal Medicine

## 2014-03-08 ENCOUNTER — Encounter: Payer: Self-pay | Admitting: Internal Medicine

## 2014-03-13 ENCOUNTER — Encounter: Payer: Self-pay | Admitting: Internal Medicine

## 2014-03-15 ENCOUNTER — Ambulatory Visit (HOSPITAL_COMMUNITY): Payer: Medicare HMO | Admitting: Certified Registered Nurse Anesthetist

## 2014-03-15 ENCOUNTER — Encounter (HOSPITAL_COMMUNITY)
Admission: RE | Disposition: A | Discharge status: Home / Self Care | Payer: Self-pay | Source: Ambulatory Visit | Attending: Gastroenterology

## 2014-03-15 ENCOUNTER — Encounter (HOSPITAL_COMMUNITY): Payer: Self-pay | Admitting: Gastroenterology

## 2014-03-15 ENCOUNTER — Ambulatory Visit (HOSPITAL_COMMUNITY)
Admission: RE | Admit: 2014-03-15 | Discharge: 2014-03-15 | Disposition: A | Discharge status: Home / Self Care | Payer: Medicare HMO | Source: Ambulatory Visit | Attending: Gastroenterology | Admitting: Gastroenterology

## 2014-03-15 DIAGNOSIS — I471 Supraventricular tachycardia: Secondary | ICD-10-CM | POA: Insufficient documentation

## 2014-03-15 DIAGNOSIS — I4891 Unspecified atrial fibrillation: Secondary | ICD-10-CM | POA: Insufficient documentation

## 2014-03-15 DIAGNOSIS — D124 Benign neoplasm of descending colon: Secondary | ICD-10-CM | POA: Diagnosis not present

## 2014-03-15 DIAGNOSIS — J449 Chronic obstructive pulmonary disease, unspecified: Secondary | ICD-10-CM | POA: Insufficient documentation

## 2014-03-15 DIAGNOSIS — D123 Benign neoplasm of transverse colon: Secondary | ICD-10-CM | POA: Diagnosis not present

## 2014-03-15 DIAGNOSIS — Z87891 Personal history of nicotine dependence: Secondary | ICD-10-CM | POA: Diagnosis not present

## 2014-03-15 DIAGNOSIS — K5730 Diverticulosis of large intestine without perforation or abscess without bleeding: Secondary | ICD-10-CM | POA: Insufficient documentation

## 2014-03-15 DIAGNOSIS — I469 Cardiac arrest, cause unspecified: Secondary | ICD-10-CM | POA: Diagnosis not present

## 2014-03-15 DIAGNOSIS — I420 Dilated cardiomyopathy: Secondary | ICD-10-CM | POA: Insufficient documentation

## 2014-03-15 DIAGNOSIS — Z7901 Long term (current) use of anticoagulants: Secondary | ICD-10-CM | POA: Diagnosis not present

## 2014-03-15 DIAGNOSIS — I130 Hypertensive heart and chronic kidney disease with heart failure and stage 1 through stage 4 chronic kidney disease, or unspecified chronic kidney disease: Secondary | ICD-10-CM | POA: Diagnosis not present

## 2014-03-15 DIAGNOSIS — I5023 Acute on chronic systolic (congestive) heart failure: Secondary | ICD-10-CM | POA: Insufficient documentation

## 2014-03-15 DIAGNOSIS — N182 Chronic kidney disease, stage 2 (mild): Secondary | ICD-10-CM | POA: Diagnosis not present

## 2014-03-15 DIAGNOSIS — Z950 Presence of cardiac pacemaker: Secondary | ICD-10-CM | POA: Diagnosis not present

## 2014-03-15 DIAGNOSIS — K219 Gastro-esophageal reflux disease without esophagitis: Secondary | ICD-10-CM | POA: Insufficient documentation

## 2014-03-15 DIAGNOSIS — Z800 Family history of malignant neoplasm of digestive organs: Secondary | ICD-10-CM | POA: Insufficient documentation

## 2014-03-15 DIAGNOSIS — D122 Benign neoplasm of ascending colon: Secondary | ICD-10-CM

## 2014-03-15 DIAGNOSIS — Z79899 Other long term (current) drug therapy: Secondary | ICD-10-CM | POA: Diagnosis not present

## 2014-03-15 DIAGNOSIS — Z1211 Encounter for screening for malignant neoplasm of colon: Secondary | ICD-10-CM | POA: Insufficient documentation

## 2014-03-15 HISTORY — DX: Presence of cardiac pacemaker: Z95.0

## 2014-03-15 HISTORY — PX: COLONOSCOPY WITH PROPOFOL: SHX5780

## 2014-03-15 SURGERY — COLONOSCOPY WITH PROPOFOL
Anesthesia: Monitor Anesthesia Care

## 2014-03-15 MED ORDER — LIDOCAINE HCL (CARDIAC) 20 MG/ML IV SOLN
INTRAVENOUS | Status: DC | PRN
  Administered 2014-03-15: 50 mg via INTRAVENOUS

## 2014-03-15 MED ORDER — LACTATED RINGERS IV SOLN
INTRAVENOUS | Status: DC
  Administered 2014-03-15: 1000 mL via INTRAVENOUS

## 2014-03-15 MED ORDER — PROPOFOL 10 MG/ML IV BOLUS
INTRAVENOUS | Status: DC | PRN
  Administered 2014-03-15 (×3): 20 mg via INTRAVENOUS

## 2014-03-15 MED ORDER — LIDOCAINE HCL (CARDIAC) 20 MG/ML IV SOLN
INTRAVENOUS | Status: AC
  Filled 2014-03-15: qty 5

## 2014-03-15 MED ORDER — PROPOFOL INFUSION 10 MG/ML OPTIME
INTRAVENOUS | Status: DC | PRN
  Administered 2014-03-15: 120 ug/kg/min via INTRAVENOUS

## 2014-03-15 MED ORDER — PROPOFOL 10 MG/ML IV BOLUS
INTRAVENOUS | Status: AC
  Filled 2014-03-15: qty 20

## 2014-03-15 MED ORDER — PROMETHAZINE HCL 25 MG/ML IJ SOLN: 6.2500 mg | INTRAMUSCULAR | Status: DC | PRN

## 2014-03-15 MED ORDER — MEPERIDINE HCL 100 MG/ML IJ SOLN: 6.2500 mg | INTRAMUSCULAR | Status: DC | PRN

## 2014-03-15 MED ORDER — SODIUM CHLORIDE 0.9 % IV SOLN: INTRAVENOUS | Status: DC

## 2014-03-15 SURGICAL SUPPLY — 22 items

## 2014-03-15 NOTE — H&P (View-Only) (Signed)
Patient ID: [name], male   DOB: February 09, 1951, 64 y.o.   MRN: [id number]   Subjective:    Patient ID: [name], male    DOB: 1950/10/29, 64 y.o.   MRN: [id number]  HPI Male is a pleasant 64 year old African-American male who comes in today self referred for colon neoplasia screening. His primary physician is Dr. York Ram cardiologist is [name]. Patient has not had any prior GI evaluation. He says he has family history of colon cancer in an aunt who was diagnosed in her 43s. He has no current GI complaints, specifically no problems with abdominal pain and change in bowel habits melena or hematochezia. His appetite has been fine his weight has been stable. He does get occasional heartburn and indigestion and has a prescription for Protonix which she says he takes most days. He does have history of an nonischemic cardiomyopathy with most recent EF documented at 35% EF in February 2015 was 25-30%. He is status post pacemaker placement in August 2015 for heart block has history of atrial fib/ flutter, chronic kidney disease stage II, and SVT. He was just recently seen by Dr. Crissie Sickles who did the pacemaker placement and has been felt to be stable from a cardiac standpoint. He is currently on  Savasya for anticoagulation.  Review of Systems Pertinent positive and negative review of systems were noted in the above HPI section.  All other review of systems was otherwise negative.  Outpatient Encounter Prescriptions as of 02/13/2014  Medication Sig  . allopurinol (ZYLOPRIM) 100 MG tablet Take 100 mg by mouth daily.   . carvedilol (COREG) 6.25 MG tablet Take 6.25 mg by mouth 2 (two) times daily.  . colchicine 0.6 MG tablet Take 0.6 mg by mouth daily.   . Edoxaban Tosylate (SAVAYSA) 30 MG TABS Take 30 mg by mouth daily.   . furosemide (LASIX) 40 MG tablet Take 40 mg by mouth daily.   Marland Kitchen lisinopril (PRINIVIL,ZESTRIL) 20 MG tablet Take 0.5 tablets (10 mg total) by mouth daily.  . Multiple  Vitamin (MULTIVITAMIN WITH MINERALS) TABS tablet Take 1 tablet by mouth daily.  Marland Kitchen oxyCODONE (OXY IR/ROXICODONE) 5 MG immediate release tablet Take 5 mg by mouth 2 (two) times daily as needed for moderate pain or severe pain.  . pantoprazole (PROTONIX) 40 MG tablet Take 40 mg by mouth daily as needed (for heartburn).   . rosuvastatin (CRESTOR) 20 MG tablet Take 10 mg by mouth every Monday, Wednesday, and Friday.  Marland Kitchen spironolactone (ALDACTONE) 12.5 mg TABS tablet Take 0.5 tablets (12.5 mg total) by mouth daily.  Marland Kitchen MOVIPREP 100 G SOLR Take 1 kit (200 g total) by mouth as directed.   Allergies  Allergen Reactions  . Ibuprofen Other (See Comments)    Makes heart race   Patient Active Problem List   Diagnosis Date Noted  . Pacemaker 01/09/2014  . Cardiac arrest 09/30/2013  . Hyperkalemia 09/30/2013  . Acute kidney failure 09/30/2013  . Cardiomyopathy 09/30/2013  . Acute respiratory failure 09/30/2013  . AKI (acute kidney injury) 08/23/2013  . Heart block 08/23/2013  . Syncope 08/22/2013  . Atrial flutter 03/29/2013  . Acute on chronic left systolic heart failure 19/41/7408  . Chronic systolic heart failure 14/48/1856  . SVT (supraventricular tachycardia) 09/16/2011  . Pulmonary hypertension 09/11/2011  . 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 Cardiogenic shock 09/10/2011  . Cardiorenal syndrome 09/10/2011  . 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 Acute on chronic systolic heart failure 31/49/7026  Class: Acute  . Hypertension, benign 01/31/2011    Class: Chronic  . Dilated cardiomyopathy 01/31/2011    Class: Chronic  . Chronic renal insufficiency, stage II (mild) 01/31/2011    Class: Chronic   History   Social History  . Marital Status: Single    Spouse Name: N/A    Number of Children: 2  . Years of Education: N/A   Occupational History  . auto body Mechanic/painter    Social History Main Topics  . Smoking status: Former Smoker -- 0.25 packs/day  for 15 years    Types: Cigarettes    Quit date: 07/17/2011  . Smokeless tobacco: Never Used  . Alcohol Use: 3.6 oz/week    6 Cans of beer per week     Comment: 12/22/2012 1, ~ 24oz beer q other day at most"  . Drug Use: Yes    Special: Cocaine, Marijuana     Comment: 12/22/2012 "Cocaine and Marijuana in 1990's and stopped all drug abuse in 2003"  . Sexual Activity: Not Currently   Other Topics Concern  . Not on file   Social History Narrative    Mr. Vetsch's family history includes Alcohol abuse in his father; Colon cancer in his maternal aunt; Colon polyps in his cousin; Diabetes in his sister; Heart failure in his mother; Liver cancer in his brother; Lung cancer in his mother; Other in his maternal aunt; Pneumonia in his father; Prostate cancer in his paternal grandfather; Seizures in his brother; Stroke in his sister.      Objective:    Filed Vitals:   02/13/14 0915  BP: 102/72  Pulse: 64    Physical Exam   well-developed African-American male in no acute distress, pleasant blood pressure 102/72 pulse 64 height is 5 foot 7 weight 153. HEENT; nontraumatic normocephalic EOMI PERRLA sclera anicteric, Cardiovascular; regular rate and rhythm with S1-S2 he does have a systolic murmur, Pulmonary; clear bilaterally, Abdomen; soft nontender nondistended bowel sounds are active there is no palpable mass or hepatosplenomegaly, Rectal ;exam not done, Extremities ;no clubbing cyanosis or edema skin ;warm and dry, Psych; mood and affect appropriate     Assessment & Plan:   #1 64 yo AA male here for colon neoplasia screening. He is asymptomatic, has not had any prior colon evaluation and has family history of colon cancer in an aunt. #2 chronic anticoagulation currently on Savasya #3 history of nonischemic cardiomyopathy EF 30-35%  #4 next number GERD #5 chronic kidney disease stage II #6 history of atrial fib/ flutter  #7  history of heart block status post pacemaker placement August  2015   Plan; patient is scheduled for colonoscopy with Dr. Ardis Hughs. This will be scheduled at [facility] given his cardiomyopathy and EF of 30-35%. Procedure was discussed in detail with the patient and he is agreeable to proceed. We will obtain consent from his cardiologist [name] for patient to hold [facility] for 3-5 days prior to procedure. Deaysia Grigoryan Genia Harold PA-C 02/13/2014

## 2014-03-15 NOTE — Interval H&P Note (Signed)
History and Physical Interval Note:  03/15/2014 7:56 AM  [name]  has presented today for surgery, with the diagnosis of screening  The various methods of treatment have been discussed with the patient and family. After consideration of risks, benefits and other options for treatment, the patient has consented to  Procedure(s): COLONOSCOPY WITH PROPOFOL (N/A) as a surgical intervention .  The patient's history has been reviewed, patient examined, no change in status, stable for surgery.  I have reviewed the patient's chart and labs.  Questions were answered to the patient's satisfaction.     Milus Banister

## 2014-03-15 NOTE — Anesthesia Preprocedure Evaluation (Signed)
Anesthesia Evaluation  Patient identified by MRN, date of birth, ID band Patient awake    Reviewed: Allergy & Precautions, NPO status , Patient's Chart, lab work & pertinent test results  Airway Mallampati: II  TM Distance: >3 FB Neck ROM: Full    Dental no notable dental hx.    Pulmonary COPDformer smoker,  breath sounds clear to auscultation  Pulmonary exam normal       Cardiovascular hypertension, Pt. on medications +CHF + dysrhythmias Atrial Fibrillation and Supra Ventricular Tachycardia + pacemaker Rhythm:Regular Rate:Normal     Neuro/Psych negative neurological ROS  negative psych ROS   GI/Hepatic negative GI ROS, Neg liver ROS,   Endo/Other  negative endocrine ROS  Renal/GU negative Renal ROS  negative genitourinary   Musculoskeletal negative musculoskeletal ROS (+)   Abdominal   Peds negative pediatric ROS (+)  Hematology negative hematology ROS (+)   Anesthesia Other Findings   Reproductive/Obstetrics negative OB ROS                             Anesthesia Physical Anesthesia Plan  ASA: III  Anesthesia Plan: MAC   Post-op Pain Management:    Induction:   Airway Management Planned: Simple Face Mask  Additional Equipment:   Intra-op Plan:   Post-operative Plan:   Informed Consent: I have reviewed the patients History and Physical, chart, labs and discussed the procedure including the risks, benefits and alternatives for the proposed anesthesia with the patient or authorized representative who has indicated his/her understanding and acceptance.   Dental advisory given  Plan Discussed with: CRNA  Anesthesia Plan Comments:         Anesthesia Quick Evaluation

## 2014-03-15 NOTE — Discharge Instructions (Signed)
YOU HAD AN ENDOSCOPIC PROCEDURE TODAY: Refer to the procedure report that was given to you for any specific questions about what was found during the examination.  If the procedure report does not answer your questions, please call your gastroenterologist to clarify.  YOU SHOULD EXPECT: Some feelings of bloating in the abdomen. Passage of more gas than usual.  Walking can help get rid of the air that was put into your GI tract during the procedure and reduce the bloating. If you had a lower endoscopy (such as a colonoscopy or flexible sigmoidoscopy) you may notice spotting of blood in your stool or on the toilet paper.   DIET: Your first meal following the procedure should be a light meal and then it is ok to progress to your normal diet.  A half-sandwich or bowl of soup is an example of a good first meal.  Heavy or fried foods are harder to digest and may make you feel nasueas or bloated.  Drink plenty of fluids but you should avoid alcoholic beverages for 24 hours.  ACTIVITY: Your care partner should take you home directly after the procedure.  You should plan to take it easy, moving slowly for the rest of the day.  You can resume normal activity the day after the procedure however you should NOT DRIVE or use heavy machinery for 24 hours (because of the sedation medicines used during the test).    SYMPTOMS TO REPORT IMMEDIATELY  A gastroenterologist can be reached at any hour.  Please call your doctor's office for any of the following symptoms:   Following lower endoscopy (colonoscopy, flexible sigmoidoscopy)  Excessive amounts of blood in the stool  Significant tenderness, worsening of abdominal pains  Swelling of the abdomen that is new, acute  Fever of 100 or higher  Following upper endoscopy (EGD, EUS, ERCP)  Vomiting of blood or coffee ground material  New, significant abdominal pain  New, significant chest pain or pain under the shoulder blades  Painful or persistently difficult  swallowing  New shortness of breath  Black, tarry-looking stools  FOLLOW UP: If any biopsies were taken you will be contacted by phone or by letter within the next 1-3 weeks.  Call your gastroenterologist if you have not heard about the biopsies in 3 weeks.  Please also call your gastroenterologist's office with any specific questions about appointments or follow up tests. Colonoscopy, Care After Refer to this sheet in the next few weeks. These instructions provide you with information on caring for yourself after your procedure. Your health care provider may also give you more specific instructions. Your treatment has been planned according to current medical practices, but problems sometimes occur. Call your health care provider if you have any problems or questions after your procedure. WHAT TO EXPECT AFTER THE PROCEDURE  After your procedure, it is typical to have the following:  A small amount of blood in your stool.  Moderate amounts of gas and mild abdominal cramping or bloating. HOME CARE INSTRUCTIONS  Do not drive, operate machinery, or sign important documents for 24 hours.  You may shower and resume your regular physical activities, but move at a slower pace for the first 24 hours.  Take frequent rest periods for the first 24 hours.  Walk around or put a warm pack on your abdomen to help reduce abdominal cramping and bloating.  Drink enough fluids to keep your urine clear or pale yellow.  You may resume your normal diet as instructed by your health  care provider. Avoid heavy or fried foods that are hard to digest.  Avoid drinking alcohol for 24 hours or as instructed by your health care provider.  Only take over-the-counter or prescription medicines as directed by your health care provider.  If a tissue sample (biopsy) was taken during your procedure:  Do not take aspirin or blood thinners for 7 days, or as instructed by your health care provider.  Do not drink alcohol  for 7 days, or as instructed by your health care provider.  Eat soft foods for the first 24 hours. SEEK MEDICAL CARE IF: You have persistent spotting of blood in your stool 2-3 days after the procedure. SEEK IMMEDIATE MEDICAL CARE IF:  You have more than a small spotting of blood in your stool.  You pass large blood clots in your stool.  Your abdomen is swollen (distended).  You have nausea or vomiting.  You have a fever.  You have increasing abdominal pain that is not relieved with medicine. Document Released: 09/17/2003 Document Revised: 11/23/2012 Document Reviewed: 10/10/2012 [facility] Patient Information 2015 [place], [place]. This information is not intended to replace advice given to you by your health care provider. Make sure you discuss any questions you have with your health care provider.

## 2014-03-15 NOTE — Transfer of Care (Signed)
Immediate Anesthesia Transfer of Care Note  Patient: [name]  Procedure(s) Performed: Procedure(s) (LRB): COLONOSCOPY WITH PROPOFOL (N/A)  Patient Location: endoscopy  Anesthesia Type: MAC  Level of Consciousness: sedated, patient cooperative and responds to stimulation  Airway & Oxygen Therapy: Patient Spontanous Breathing and Patient connected to face mask oxgen  Post-op Assessment: Report given to endoscopy and Post -op Vital signs reviewed and stable  Post vital signs: Reviewed and stable  Complications: No apparent anesthesia complications

## 2014-03-15 NOTE — Anesthesia Postprocedure Evaluation (Signed)
  Anesthesia Post-op Note  Patient: [name]  Procedure(s) Performed: Procedure(s) (LRB): COLONOSCOPY WITH PROPOFOL (N/A)  Patient Location: PACU  Anesthesia Type: MAC  Level of Consciousness: awake and alert   Airway and Oxygen Therapy: Patient Spontanous Breathing  Post-op Pain: mild  Post-op Assessment: Post-op Vital signs reviewed, Patient's Cardiovascular Status Stable, Respiratory Function Stable, Patent Airway and No signs of Nausea or vomiting  Last Vitals:  Filed Vitals:   03/15/14 0920  BP: 124/90  Pulse: 58  Temp:   Resp: 15    Post-op Vital Signs: stable   Complications: No apparent anesthesia complications

## 2014-03-15 NOTE — Op Note (Signed)
[place], [id number]   COLONOSCOPY PROCEDURE REPORT  PATIENT: [name], [name]  MR#: [id number] BIRTHDATE: 18-Jun-1950 , 63  yrs. old GENDER: male ENDOSCOPIST: Milus Banister, MD PROCEDURE DATE:  03/15/2014 PROCEDURE:   Colonoscopy with snare polypectomy First Screening Colonoscopy - Avg.  risk and is 50 yrs.  old or older Yes.  Prior Negative Screening - Now for repeat screening. N/A  History of Adenoma - Now for follow-up colonoscopy & has been > or = to 3 yrs.  N/A  Polyps Removed Today? Yes. ASA CLASS:   Class III INDICATIONS:average risk for colon cancer. MEDICATIONS: Monitored anesthesia care  DESCRIPTION OF PROCEDURE:   After the risks benefits and alternatives of the procedure were thoroughly explained, informed consent was obtained.  The digital rectal exam revealed no abnormalities of the rectum.   The EC-[id number] ([id number])  endoscope was introduced through the anus and advanced to the cecum, which was identified by both the appendix and ileocecal valve. No adverse events experienced.   The quality of the prep was excellent.  The instrument was then slowly withdrawn as the colon was fully examined.  COLON FINDINGS: Three polyps were found, removed and sent to pathology.  These were all sessile, ranged in size from 2 to 38mm across, located in ascending, transverse and descending segments, all removed with cold snare and all sent to pathology.  There were a few small left sided diverticulum.  The examination was otherwise normal.  Retroflexed views revealed no abnormalities. The time to cecum=3 minutes 00 seconds.  Withdrawal time=8 minutes 00 seconds. The scope was withdrawn and the procedure completed. COMPLICATIONS: There were no immediate complications.  ENDOSCOPIC IMPRESSION: Three polyps were found, removed and sent to pathology.  These were all sessile, ranged in size from 2 to 46mm across, located in ascending, transverse  and descending segments, all removed with cold snare and all sent to pathology.  There were a few small left sided diverticulum.  The examination was otherwise normal  RECOMMENDATIONS: 1. If the polyp(s) removed today are proven to be adenomatous (pre-cancerous) polyps, you will need a colonoscopy in 3-5 years. Otherwise you should continue to follow colorectal cancer screening guidelines for "routine risk" patients with a colonoscopy in 10 years.  You will receive a letter within 1-2 weeks with the results of your biopsy as well as final recommendations.  Please call my office if you have not received a letter after 3 weeks. 2. You can resume your blood thinner tomorrow.  eSigned:  Milus Banister, MD 03/15/2014 8:58 AM

## 2014-03-16 ENCOUNTER — Encounter (HOSPITAL_COMMUNITY): Payer: Self-pay | Admitting: Gastroenterology

## 2014-03-16 LAB — GLUCOSE, CAPILLARY: GLUCOSE-CAPILLARY: 74 mg/dL (ref 70–99)

## 2014-04-05 ENCOUNTER — Encounter: Payer: Self-pay | Admitting: Internal Medicine

## 2014-04-11 ENCOUNTER — Ambulatory Visit (INDEPENDENT_AMBULATORY_CARE_PROVIDER_SITE_OTHER): Payer: Commercial Managed Care - HMO | Admitting: *Deleted

## 2014-04-11 DIAGNOSIS — I459 Conduction disorder, unspecified: Secondary | ICD-10-CM

## 2014-04-11 LAB — MDC_IDC_ENUM_SESS_TYPE_REMOTE
Battery Remaining Longevity: 76 mo
Battery Remaining Percentage: 94 %
Battery Voltage: 2.99 V
Brady Statistic AS VS Percent: 2.9 %
Brady Statistic RA Percent Paced: 30 %
Date Time Interrogation Session: 20160224120104
Implantable Pulse Generator Serial Number: 7614436
Lead Channel Impedance Value: 410 Ohm
Lead Channel Impedance Value: 490 Ohm
Lead Channel Pacing Threshold Amplitude: 0.75 V
Lead Channel Pacing Threshold Amplitude: 1 V
Lead Channel Pacing Threshold Amplitude: 1.25 V
Lead Channel Pacing Threshold Pulse Width: 0.4 ms
Lead Channel Pacing Threshold Pulse Width: 0.8 ms
Lead Channel Sensing Intrinsic Amplitude: 10.4 mV
Lead Channel Sensing Intrinsic Amplitude: 2 mV
Lead Channel Setting Pacing Amplitude: 2 V
Lead Channel Setting Pacing Amplitude: 2.5 V
Lead Channel Setting Pacing Pulse Width: 0.4 ms
MDC IDC MSMT LEADCHNL RA IMPEDANCE VALUE: 460 Ohm
MDC IDC MSMT LEADCHNL RA PACING THRESHOLD PULSEWIDTH: 0.4 ms
MDC IDC PG MODEL: 3242
MDC IDC SET LEADCHNL LV PACING PULSEWIDTH: 0.8 ms
MDC IDC SET LEADCHNL RA PACING AMPLITUDE: 2 V
MDC IDC SET LEADCHNL RV SENSING SENSITIVITY: 2 mV
MDC IDC STAT BRADY AP VP PERCENT: 31 %
MDC IDC STAT BRADY AP VS PERCENT: 1 %
MDC IDC STAT BRADY AS VP PERCENT: 66 %

## 2014-04-11 NOTE — Progress Notes (Signed)
Remote pacemaker transmission.   

## 2014-04-20 ENCOUNTER — Encounter: Payer: Self-pay | Admitting: Cardiology

## 2014-04-25 ENCOUNTER — Encounter: Payer: Self-pay | Admitting: Internal Medicine

## 2014-06-01 ENCOUNTER — Encounter: Payer: Self-pay | Admitting: Internal Medicine

## 2014-07-12 ENCOUNTER — Ambulatory Visit (INDEPENDENT_AMBULATORY_CARE_PROVIDER_SITE_OTHER): Payer: Medicare HMO | Admitting: *Deleted

## 2014-07-12 ENCOUNTER — Encounter: Payer: Self-pay | Admitting: Internal Medicine

## 2014-07-12 DIAGNOSIS — I459 Conduction disorder, unspecified: Secondary | ICD-10-CM | POA: Diagnosis not present

## 2014-07-12 NOTE — Progress Notes (Signed)
Remote pacemaker transmission.   

## 2014-07-20 LAB — CUP PACEART REMOTE DEVICE CHECK
Battery Remaining Percentage: 89 %
Battery Voltage: 2.98 V
Brady Statistic AP VS Percent: 1 %
Date Time Interrogation Session: 20160526084120
Lead Channel Impedance Value: 380 Ohm
Lead Channel Impedance Value: 460 Ohm
Lead Channel Impedance Value: 490 Ohm
Lead Channel Pacing Threshold Pulse Width: 0.4 ms
Lead Channel Sensing Intrinsic Amplitude: 10.8 mV
Lead Channel Sensing Intrinsic Amplitude: 4.2 mV
Lead Channel Setting Pacing Amplitude: 2 V
Lead Channel Setting Pacing Amplitude: 2.5 V
Lead Channel Setting Pacing Pulse Width: 0.8 ms
MDC IDC MSMT BATTERY REMAINING LONGEVITY: 70 mo
MDC IDC MSMT LEADCHNL LV PACING THRESHOLD AMPLITUDE: 1.25 V
MDC IDC MSMT LEADCHNL LV PACING THRESHOLD PULSEWIDTH: 0.8 ms
MDC IDC MSMT LEADCHNL RA PACING THRESHOLD AMPLITUDE: 0.75 V
MDC IDC MSMT LEADCHNL RV PACING THRESHOLD AMPLITUDE: 1 V
MDC IDC MSMT LEADCHNL RV PACING THRESHOLD PULSEWIDTH: 0.4 ms
MDC IDC SET LEADCHNL RA PACING AMPLITUDE: 2 V
MDC IDC SET LEADCHNL RV PACING PULSEWIDTH: 0.4 ms
MDC IDC SET LEADCHNL RV SENSING SENSITIVITY: 2 mV
MDC IDC STAT BRADY AP VP PERCENT: 30 %
MDC IDC STAT BRADY AS VP PERCENT: 67 %
MDC IDC STAT BRADY AS VS PERCENT: 3.2 %
MDC IDC STAT BRADY RA PERCENT PACED: 29 %
Pulse Gen Model: 3242
Pulse Gen Serial Number: 7614436

## 2014-07-27 ENCOUNTER — Encounter: Payer: Self-pay | Admitting: Cardiology

## 2014-08-03 ENCOUNTER — Encounter: Payer: Self-pay | Admitting: Internal Medicine

## 2014-08-03 ENCOUNTER — Ambulatory Visit (INDEPENDENT_AMBULATORY_CARE_PROVIDER_SITE_OTHER): Payer: Medicare HMO | Admitting: Internal Medicine

## 2014-08-03 VITALS — BP 108/80 | HR 60 | Ht 68.0 in | Wt 153.6 lb

## 2014-08-03 DIAGNOSIS — I459 Conduction disorder, unspecified: Secondary | ICD-10-CM | POA: Diagnosis not present

## 2014-08-03 DIAGNOSIS — I483 Typical atrial flutter: Secondary | ICD-10-CM

## 2014-08-03 DIAGNOSIS — I10 Essential (primary) hypertension: Secondary | ICD-10-CM | POA: Diagnosis not present

## 2014-08-03 DIAGNOSIS — I5022 Chronic systolic (congestive) heart failure: Secondary | ICD-10-CM | POA: Diagnosis not present

## 2014-08-03 DIAGNOSIS — I471 Supraventricular tachycardia: Secondary | ICD-10-CM | POA: Diagnosis not present

## 2014-08-03 LAB — CUP PACEART INCLINIC DEVICE CHECK
Battery Voltage: 2.99 V
Brady Statistic RA Percent Paced: 28 %
Brady Statistic RV Percent Paced: 96 %
Date Time Interrogation Session: 20160617115248
Lead Channel Impedance Value: 362.5 Ohm
Lead Channel Impedance Value: 462.5 Ohm
Lead Channel Impedance Value: 487.5 Ohm
Lead Channel Pacing Threshold Amplitude: 0.75 V
Lead Channel Pacing Threshold Pulse Width: 0.4 ms
Lead Channel Sensing Intrinsic Amplitude: 2.1 mV
Lead Channel Setting Pacing Amplitude: 2 V
Lead Channel Setting Pacing Amplitude: 2.5 V
Lead Channel Setting Pacing Pulse Width: 0.4 ms
Lead Channel Setting Pacing Pulse Width: 0.4 ms
MDC IDC MSMT BATTERY REMAINING LONGEVITY: 85.2 mo
MDC IDC MSMT LEADCHNL LV PACING THRESHOLD AMPLITUDE: 1 V
MDC IDC MSMT LEADCHNL LV PACING THRESHOLD PULSEWIDTH: 0.4 ms
MDC IDC MSMT LEADCHNL RA PACING THRESHOLD PULSEWIDTH: 0.4 ms
MDC IDC MSMT LEADCHNL RV PACING THRESHOLD AMPLITUDE: 1 V
MDC IDC MSMT LEADCHNL RV SENSING INTR AMPL: 11.4 mV
MDC IDC PG SERIAL: 7614436
MDC IDC SET LEADCHNL RA PACING AMPLITUDE: 2 V
MDC IDC SET LEADCHNL RV SENSING SENSITIVITY: 2 mV
Pulse Gen Model: 3242

## 2014-08-03 NOTE — Assessment & Plan Note (Signed)
His blood pressure is well controlled. No change in meds.  

## 2014-08-03 NOTE — Patient Instructions (Signed)
Medication Instructions:  Your physician recommends that you continue on your current medications as directed. Please refer to the Current Medication list given to you today.   Labwork: None ordered  Testing/Procedures: None ordered  Follow-Up: Your physician wants you to follow-up in: 12 months with Dr Knox Saliva will receive a reminder letter in the mail two months in advance. If you don't receive a letter, please call our office to schedule the follow-up appointment.  Remote monitoring is used to monitor your Pacemaker or ICD from home. This monitoring reduces the number of office visits required to check your device to one time per year. It allows [place] to keep an eye on the functioning of your device to ensure it is working properly. You are scheduled for a device check from home on 11/05/14. You may send your transmission at any time that day. If you have a wireless device, the transmission will be sent automatically. After your physician reviews your transmission, you will receive a postcard with your next transmission date.    Any Other Special Instructions Will Be Listed Below (If Applicable).

## 2014-08-03 NOTE — Assessment & Plan Note (Signed)
His symptoms are class 2. Will continue his current meds.  I have encouraged him to reduce his sodium and ETOH intake.

## 2014-08-03 NOTE — Progress Notes (Signed)
HPI [name] returns today for followup. He is a very pleasant 64 year old man with a nonischemic cardiomyopathy and SVT.  He has not had palpitations. No edema. He underwent biventricular pacemaker almost a year ago. In the interim, he has done well. He denies fever or chills, and no swelling. His wife has tried to reduce his sodium intake. Allergies  Allergen Reactions  . Ibuprofen Other (See Comments)    Makes heart race     Current Outpatient Prescriptions  Medication Sig Dispense Refill  . allopurinol (ZYLOPRIM) 100 MG tablet Take 100 mg by mouth daily.     . carvedilol (COREG) 6.25 MG tablet Take 6.25 mg by mouth 2 (two) times daily.  5  . Edoxaban Tosylate (SAVAYSA) 30 MG TABS Take 30 mg by mouth daily.     . furosemide (LASIX) 40 MG tablet Take 40 mg by mouth daily.     Marland Kitchen lisinopril (PRINIVIL,ZESTRIL) 20 MG tablet Take 0.5 tablets (10 mg total) by mouth daily.    . Multiple Vitamin (MULTIVITAMIN WITH MINERALS) TABS tablet Take 0.5 tablets by mouth 2 (two) times daily.     Marland Kitchen oxyCODONE (OXY IR/ROXICODONE) 5 MG immediate release tablet Take 5 mg by mouth 2 (two) times daily as needed for moderate pain or severe pain.    . pantoprazole (PROTONIX) 40 MG tablet Take 40 mg by mouth daily as needed (for heartburn).     . rosuvastatin (CRESTOR) 20 MG tablet Take 10 mg by mouth every Monday, Wednesday, and Friday.     No current facility-administered medications for this visit.     Past Medical History  Diagnosis Date  . Hypertension   . GERD (gastroesophageal reflux disease)   . Hiatal hernia   . CHF (congestive heart failure)   . Cardiomyopathy     nonischemic   . COPD (chronic obstructive pulmonary disease)     previously listed in DC summary from Feb 2012   . Chronic kidney disease (CKD), stage II (mild)     listed in chart although patient denies   . Atrial fibrillation      previously listed in DC summary from August 31, 2011, ? atrial tachycardia   . SVT (supraventricular  tachycardia)   . Heart murmur     "dx'd in middle school" (12/22/2012)  . Pneumonia     "once when I was a baby; probably 3-4 times since then" (12/22/2012)  . Shortness of breath     "all the time last couple months" (12/22/2012)  . ALPFXTKW(409.7)     "weekly" (01/01/2013)  . Arthritis     "neck" (12/22/2012)  . Gout   . Depression   . Presence of permanent cardiac pacemaker     st jude    ROS:   All systems reviewed and negative except as noted in the HPI.   Past Surgical History  Procedure Laterality Date  . Tee without cardioversion N/A 05/11/2012    Procedure: TRANSESOPHAGEAL ECHOCARDIOGRAM (TEE);  Surgeon: Birdie Riddle, MD;  Location: [facility] ENDOSCOPY;  Service: Cardiovascular;  Laterality: N/A;  . Cardioversion N/A 05/11/2012    Procedure: Electrocardioversion, external;  Surgeon: Birdie Riddle, MD;  Location: [place];  Service: Cardiovascular;  Laterality: N/A;  . Cardioversion N/A 05/11/2012    Procedure: CARDIOVERSION AT THE BEDSIDE;  Surgeon: Birdie Riddle, MD;  Location: [place];  Service: Cardiovascular;  Laterality: N/A;  . Supraventricular tachycardia ablation  08/2011; 09/2011  . Cardiac catheterization  08/2011  . Left heart catheterization  with coronary angiogram N/A 02/02/2011    Procedure: LEFT HEART CATHETERIZATION WITH CORONARY ANGIOGRAM;  Surgeon: Birdie Riddle, MD;  Location: [place] CATH LAB;  Service: Cardiovascular;  Laterality: N/A;  . Right heart catheterization N/A 09/10/2011    Procedure: RIGHT HEART CATH;  Surgeon: Jolaine Artist, MD;  Location: [facility] CATH LAB;  Service: Cardiovascular;  Laterality: N/A;  . Supraventricular tachycardia ablation N/A 09/14/2011    Procedure: SUPRAVENTRICULAR TACHYCARDIA ABLATION;  Surgeon: Evans Lance, MD;  Location: [facility] CATH LAB;  Service: Cardiovascular;  Laterality: N/A;  . Supraventricular tachycardia ablation N/A 09/21/2011    Procedure: SUPRAVENTRICULAR TACHYCARDIA ABLATION;  Surgeon: Evans Lance, MD;  Location: [facility]  CATH LAB;  Service: Cardiovascular;  Laterality: N/A;  . Temporary pacemaker insertion N/A 09/30/2013    Procedure: TEMPORARY PACEMAKER INSERTION;  Surgeon: Birdie Riddle, MD;  Location: [place] CATH LAB;  Service: Cardiovascular;  Laterality: N/A;  . Left heart catheterization with coronary angiogram  09/30/2013    Procedure: LEFT HEART CATHETERIZATION WITH CORONARY ANGIOGRAM;  Surgeon: Birdie Riddle, MD;  Location: [place] CATH LAB;  Service: Cardiovascular;;  . Bi-ventricular pacemaker insertion N/A 10/05/2013    Procedure: BI-VENTRICULAR PACEMAKER INSERTION (CRT-P);  Surgeon: Evans Lance, MD;  Location: [facility] CATH LAB;  Service: Cardiovascular;  Laterality: N/A;  . Colonoscopy with propofol N/A 03/15/2014    Procedure: COLONOSCOPY WITH PROPOFOL;  Surgeon: Milus Banister, MD;  Location: [facility] ENDOSCOPY;  Service: Endoscopy;  Laterality: N/A;     Family History  Problem Relation Age of Onset  . Lung cancer Mother     passed away from lung cancer  . Pneumonia Father     passed sway from PNA.  . Stroke Sister   . Diabetes Sister     1/2 sister  . Seizures Brother     x 2 brothers  . Liver cancer Brother     mets, Hep C, kidney disease  . Prostate cancer Paternal Grandfather   . Alcohol abuse Father   . Colon cancer Maternal Aunt   . Colon polyps Cousin   . Heart failure Mother     CHF  . Other Maternal Aunt     brain tumor     History   Social History  . Marital Status: Single    Spouse Name: N/A  . Number of Children: 2  . Years of Education: N/A   Occupational History  . auto body Mechanic/painter    Social History Main Topics  . Smoking status: Former Smoker -- 0.25 packs/day for 15 years    Types: Cigarettes    Quit date: 07/17/2011  . Smokeless tobacco: Never Used  . Alcohol Use: 3.6 oz/week    6 Cans of beer per week     Comment: 12/22/2012 1, ~ 24oz beer q other day at most"  . Drug Use: Yes    Special: Cocaine, Marijuana     Comment: 12/22/2012 "Cocaine and Marijuana  in 1990's and stopped all drug abuse in 2003"  . Sexual Activity: Not Currently   Other Topics Concern  . Not on file   Social History Narrative     BP 108/80 mmHg  Pulse 60  Ht 5\' 8"  (1.727 m)  Wt 153 lb 9.6 oz (69.673 kg)  BMI 23.36 kg/m2  Physical Exam:  Well appearing middle-aged man, NAD HEENT: Unremarkable Neck:  7 cm JVD, no thyromegally Lungs:  Clear with no wheezes, rales, or rhonchi. Well healed PPM incision. HEART:  Regular rate rhythm,  no murmurs, no rubs, no clicks Abd:  soft, positive bowel sounds, no organomegally, no rebound, no guarding Ext:  2 plus pulses, no edema, no cyanosis, no clubbing Skin:  No rashes no nodules Neuro:  CN II through XII intact, motor grossly intact  NSR with BiV Pacing  Assess/Plan:

## 2014-11-05 ENCOUNTER — Ambulatory Visit (INDEPENDENT_AMBULATORY_CARE_PROVIDER_SITE_OTHER): Payer: Medicare HMO | Admitting: *Deleted

## 2014-11-05 ENCOUNTER — Encounter: Payer: Self-pay | Admitting: Internal Medicine

## 2014-11-05 DIAGNOSIS — I459 Conduction disorder, unspecified: Secondary | ICD-10-CM | POA: Diagnosis not present

## 2014-11-06 NOTE — Progress Notes (Signed)
Remote pacemaker transmission.   

## 2014-11-12 LAB — CUP PACEART REMOTE DEVICE CHECK
Battery Remaining Longevity: 91 mo
Brady Statistic AP VS Percent: 1 %
Brady Statistic AS VP Percent: 68 %
Brady Statistic AS VS Percent: 2.2 %
Brady Statistic RA Percent Paced: 28 %
Date Time Interrogation Session: 20160919093149
Lead Channel Impedance Value: 380 Ohm
Lead Channel Impedance Value: 440 Ohm
Lead Channel Impedance Value: 480 Ohm
Lead Channel Pacing Threshold Amplitude: 0.75 V
Lead Channel Pacing Threshold Amplitude: 1 V
Lead Channel Pacing Threshold Amplitude: 1.125 V
Lead Channel Pacing Threshold Pulse Width: 0.4 ms
Lead Channel Pacing Threshold Pulse Width: 0.4 ms
Lead Channel Pacing Threshold Pulse Width: 0.4 ms
Lead Channel Sensing Intrinsic Amplitude: 11.3 mV
Lead Channel Setting Pacing Amplitude: 2.5 V
Lead Channel Setting Pacing Pulse Width: 0.4 ms
Lead Channel Setting Pacing Pulse Width: 0.4 ms
MDC IDC MSMT BATTERY REMAINING PERCENTAGE: 95.5 %
MDC IDC MSMT BATTERY VOLTAGE: 2.99 V
MDC IDC MSMT LEADCHNL RA SENSING INTR AMPL: 3.1 mV
MDC IDC PG SERIAL: 7614436
MDC IDC SET LEADCHNL RA PACING AMPLITUDE: 2 V
MDC IDC SET LEADCHNL RV PACING AMPLITUDE: 2.125
MDC IDC SET LEADCHNL RV SENSING SENSITIVITY: 2 mV
MDC IDC STAT BRADY AP VP PERCENT: 29 %
Pulse Gen Model: 3242

## 2014-11-19 ENCOUNTER — Telehealth: Payer: Self-pay | Admitting: *Deleted

## 2014-11-19 NOTE — Telephone Encounter (Signed)
Attempted call x 1//NA//sss

## 2014-11-27 ENCOUNTER — Encounter: Payer: Self-pay | Admitting: Cardiology

## 2014-12-13 DIAGNOSIS — C61 Malignant neoplasm of prostate: Secondary | ICD-10-CM

## 2014-12-13 HISTORY — DX: Malignant neoplasm of prostate: C61

## 2015-01-14 ENCOUNTER — Encounter: Payer: Self-pay | Admitting: Radiation Oncology

## 2015-01-14 NOTE — Progress Notes (Signed)
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 Lowella Bandy on 09/07/14 with a PSA of 3.4, but was not compliant with FU exams. By 12/13/14 his PSA hd risen to 8.0 with a Free PSA of 21% and a biopsy was performed on this day.  Past/Anticipated interventions by urology, if any: Biopsy of Prostate  Past/Anticipated interventions by medical oncology, if any: Unknown  Weight changes, if any: No  Bowel/Bladder complaints, if any: Urinary Frequency, Nocturia x 3, intermittent burning when voiding since biopsy  Nausea/Vomiting, if any: None  Pain issues, if any:  No  SAFETY ISSUES:  Prior radiation?No  Pacemaker/ICD? Yes, St. Jude  Possible current pregnancy? No  Is the patient on methotrexate? No  Current Complaints / other details:    Grandfather had prostate cancer.  TUBULAR ADENOMA

## 2015-01-15 ENCOUNTER — Ambulatory Visit
Admission: RE | Admit: 2015-01-15 | Discharge: 2015-01-15 | Disposition: A | Discharge status: Home / Self Care | Payer: Medicare HMO | Source: Ambulatory Visit | Attending: Radiation Oncology | Admitting: Radiation Oncology

## 2015-01-15 ENCOUNTER — Encounter: Payer: Self-pay | Admitting: Radiation Oncology

## 2015-01-15 VITALS — BP 116/88 | HR 69 | Ht 68.0 in | Wt 154.3 lb

## 2015-01-15 DIAGNOSIS — Z823 Family history of stroke: Secondary | ICD-10-CM | POA: Diagnosis not present

## 2015-01-15 DIAGNOSIS — I471 Supraventricular tachycardia: Secondary | ICD-10-CM | POA: Diagnosis not present

## 2015-01-15 DIAGNOSIS — Z800 Family history of malignant neoplasm of digestive organs: Secondary | ICD-10-CM | POA: Diagnosis not present

## 2015-01-15 DIAGNOSIS — N182 Chronic kidney disease, stage 2 (mild): Secondary | ICD-10-CM | POA: Insufficient documentation

## 2015-01-15 DIAGNOSIS — C61 Malignant neoplasm of prostate: Secondary | ICD-10-CM

## 2015-01-15 DIAGNOSIS — Z801 Family history of malignant neoplasm of trachea, bronchus and lung: Secondary | ICD-10-CM | POA: Insufficient documentation

## 2015-01-15 DIAGNOSIS — I509 Heart failure, unspecified: Secondary | ICD-10-CM | POA: Diagnosis not present

## 2015-01-15 DIAGNOSIS — I4891 Unspecified atrial fibrillation: Secondary | ICD-10-CM | POA: Diagnosis not present

## 2015-01-15 DIAGNOSIS — J449 Chronic obstructive pulmonary disease, unspecified: Secondary | ICD-10-CM | POA: Diagnosis not present

## 2015-01-15 DIAGNOSIS — Z7289 Other problems related to lifestyle: Secondary | ICD-10-CM | POA: Insufficient documentation

## 2015-01-15 DIAGNOSIS — Z87891 Personal history of nicotine dependence: Secondary | ICD-10-CM | POA: Diagnosis not present

## 2015-01-15 DIAGNOSIS — Z8042 Family history of malignant neoplasm of prostate: Secondary | ICD-10-CM | POA: Diagnosis not present

## 2015-01-15 DIAGNOSIS — I129 Hypertensive chronic kidney disease with stage 1 through stage 4 chronic kidney disease, or unspecified chronic kidney disease: Secondary | ICD-10-CM | POA: Insufficient documentation

## 2015-01-15 DIAGNOSIS — Z510 Encounter for antineoplastic radiation therapy: Secondary | ICD-10-CM | POA: Insufficient documentation

## 2015-01-15 DIAGNOSIS — Z950 Presence of cardiac pacemaker: Secondary | ICD-10-CM | POA: Diagnosis not present

## 2015-01-15 DIAGNOSIS — Z833 Family history of diabetes mellitus: Secondary | ICD-10-CM | POA: Insufficient documentation

## 2015-01-15 DIAGNOSIS — F1410 Cocaine abuse, uncomplicated: Secondary | ICD-10-CM | POA: Diagnosis not present

## 2015-01-15 DIAGNOSIS — F1210 Cannabis abuse, uncomplicated: Secondary | ICD-10-CM | POA: Diagnosis not present

## 2015-01-15 HISTORY — DX: Malignant neoplasm of prostate: C61

## 2015-01-15 NOTE — Progress Notes (Signed)
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 male who is seen today through the courtesy of Dr. 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  He was apparently followed by Dr. Janice Norrie, intermittently, for the past 5-10 years.  I do not have his previous PSA determinations, but his PSA on 09/07/2014 was elevated 8.08.  Dr. Janice Norrie recommended a biopsy but the patient failed to hold his anticoagulation and this was postponed until 12/13/2014.  The biopsies were performed by Dr. Alyson Ingles.  His prostate volume was 41.5 mL.  7 of 12 biopsies were diagnostic for adenocarcinoma.  His then have Gleason 7 (3+4) involving 10% of one core from the left base (pattern 4 equals 10%), and 40% of one core from the right base (pattern 4 equals 20%).  He also had Gleason 6 (3+3) involving 10% of one core from the left lateral base, 20% of one core from left apex, less than 5% of one core from the right mid gland, 10% of one core from the right lateral base, and less than 5% of one core from the right lateral mid gland.  He does have moderate obstructive symptomatology with an I PSS score today of 16.  His multiple medical comorbidities including nonischemic cardiomyopathy with arrhythmia.  He has had a St. Jude's pacemaker since August 2015.  This is a biventricular pacemaker.  He also has a history of renal insufficiency, and question of hiatal hernia/GERD according to the patient.  He takes occasional oxycodone for history of muscle skeletal trauma along with occasional abdominal and cardiac discomfort.  No other GI  difficulties, he does have erectile dysfunction.  He is not sexually active.  PREVIOUS RADIATION THERAPY: No   PAST MEDICAL HISTORY:  has a past medical history of Hypertension; GERD (gastroesophageal reflux disease); Hiatal hernia; CHF (congestive heart failure) ([place]); Cardiomyopathy ([place]); COPD (chronic obstructive pulmonary disease) ([place]); Chronic kidney disease (CKD), stage II (mild); Atrial fibrillation ([place]); SVT (supraventricular tachycardia) ([place]); Heart murmur; Pneumonia; Shortness of breath; Headache(784.0); Arthritis; Gout; Depression; Presence of permanent cardiac pacemaker; and Prostate cancer ([place]) (12/13/14).     PAST SURGICAL HISTORY:  Past Surgical History  Procedure Laterality Date  . Tee without cardioversion N/A 05/11/2012    Procedure: TRANSESOPHAGEAL ECHOCARDIOGRAM (TEE);  Surgeon: Birdie Riddle, MD;  Location: [facility] ENDOSCOPY;  Service: Cardiovascular;  Laterality: N/A;  . Cardioversion N/A 05/11/2012    Procedure: Electrocardioversion, external;  Surgeon: Birdie Riddle, MD;  Location: [place];  Service: Cardiovascular;  Laterality: N/A;  . Cardioversion N/A 05/11/2012    Procedure: CARDIOVERSION AT THE BEDSIDE;  Surgeon: Birdie Riddle, MD;  Location: [place];  Service: Cardiovascular;  Laterality: N/A;  . Supraventricular tachycardia ablation  08/2011; 09/2011  . Cardiac catheterization  08/2011  . Left heart catheterization with coronary angiogram N/A 02/02/2011    Procedure: LEFT HEART CATHETERIZATION WITH CORONARY ANGIOGRAM;  Surgeon: Birdie Riddle, MD;  Location: [place] CATH LAB;  Service: Cardiovascular;  Laterality: N/A;  . Right heart catheterization N/A 09/10/2011    Procedure: RIGHT HEART  CATH;  Surgeon: Jolaine Artist, MD;  Location: [facility] CATH LAB;  Service: Cardiovascular;  Laterality: N/A;  . Supraventricular tachycardia ablation N/A 09/14/2011    Procedure: SUPRAVENTRICULAR TACHYCARDIA ABLATION;  Surgeon: Evans Lance, MD;  Location: [facility] CATH LAB;  Service:  Cardiovascular;  Laterality: N/A;  . Supraventricular tachycardia ablation N/A 09/21/2011    Procedure: SUPRAVENTRICULAR TACHYCARDIA ABLATION;  Surgeon: Evans Lance, MD;  Location: [facility] CATH LAB;  Service: Cardiovascular;  Laterality: N/A;  . Temporary pacemaker insertion N/A 09/30/2013    Procedure: TEMPORARY PACEMAKER INSERTION;  Surgeon: Birdie Riddle, MD;  Location: [place] CATH LAB;  Service: Cardiovascular;  Laterality: N/A;  . Left heart catheterization with coronary angiogram  09/30/2013    Procedure: LEFT HEART CATHETERIZATION WITH CORONARY ANGIOGRAM;  Surgeon: Birdie Riddle, MD;  Location: [place] CATH LAB;  Service: Cardiovascular;;  . Bi-ventricular pacemaker insertion N/A 10/05/2013    Procedure: BI-VENTRICULAR PACEMAKER INSERTION (CRT-P);  Surgeon: Evans Lance, MD;  Location: [facility] CATH LAB;  Service: Cardiovascular;  Laterality: N/A;  . Colonoscopy with propofol N/A 03/15/2014    Procedure: COLONOSCOPY WITH PROPOFOL;  Surgeon: Milus Banister, MD;  Location: [facility] ENDOSCOPY;  Service: Endoscopy;  Laterality: N/A;     FAMILY HISTORY: family history includes Alcohol abuse in his father; Colon cancer in his maternal aunt; Colon polyps in his cousin; Diabetes in his sister; Heart failure in his mother; Liver cancer in his brother; Lung cancer in his mother; Other in his maternal aunt; Pneumonia in his father; Prostate cancer in his paternal grandfather; Seizures in his brother; Stroke in his sister.  His father died of pneumonia 71.  His mother died from lung cancer 66.  His paternal grandfather was diagnosed with prostate cancer in his 4s and a paternal great uncle was diagnosed with prostate cancer in his late 61s.   SOCIAL HISTORY:  reports that he quit smoking about 3 years ago. His smoking use included Cigarettes. He has a 3.75 pack-year smoking history. He has never used smokeless tobacco. He reports that he drinks about 3.6 oz of alcohol per week. He reports that he uses illicit drugs (Cocaine  and Marijuana).  Married, 2 children.  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 He is on cardiac disability.   ALLERGIES: Ibuprofen   MEDICATIONS:  Current Outpatient Prescriptions  Medication Sig Dispense Refill  . allopurinol (ZYLOPRIM) 100 MG tablet Take 100 mg by mouth daily.     . carvedilol (COREG) 6.25 MG tablet Take 6.25 mg by mouth 2 (two) times daily.  5  . furosemide (LASIX) 40 MG tablet Take 40 mg by mouth daily.     Marland Kitchen lisinopril (PRINIVIL,ZESTRIL) 20 MG tablet Take 0.5 tablets (10 mg total) by mouth daily.    . meclizine (ANTIVERT) 25 MG tablet Take 25 mg by mouth 2 (two) times daily.    . Multiple Vitamin (MULTIVITAMIN WITH MINERALS) TABS tablet Take 0.5 tablets by mouth 2 (two) times daily.     Marland Kitchen oxyCODONE (OXY IR/ROXICODONE) 5 MG immediate release tablet Take 5 mg by mouth 2 (two) times daily as needed for moderate pain or severe pain.    . pantoprazole (PROTONIX) 40 MG tablet Take 40 mg by mouth daily as needed (for heartburn).     . rosuvastatin (CRESTOR) 20 MG tablet Take 10 mg by mouth every Monday, Wednesday, and Friday.    . Edoxaban Tosylate (SAVAYSA)  30 MG TABS Take 30 mg by mouth daily.      No current facility-administered medications for this encounter.     REVIEW OF SYSTEMS:  Pertinent items are noted in HPI.    PHYSICAL EXAM:  height is 5\' 8"  (1.727 m) and weight is 154 lb 4.8 oz (69.99 kg). His blood pressure is 116/88 and his pulse is 69.   Alert and oriented.  Chest: Left-sided anterior pacemaker.  Rectal examination: Prostate gland is normal in size and is without focal induration or nodularity.  Seminal vesicles are normal to palpation.   LABORATORY DATA:  Lab Results  Component Value Date   WBC 3.3* 10/07/2013   HGB 10.2* 10/07/2013   HCT 29.5* 10/07/2013   MCV 100.0 10/07/2013   PLT 110* 10/07/2013   Lab Results  Component Value Date   NA 135* 10/07/2013   K 4.5  10/07/2013   CL 100 10/07/2013   CO2 24 10/07/2013   Lab Results  Component Value Date   ALT 36 09/30/2013   AST 54* 09/30/2013   ALKPHOS 82 09/30/2013   BILITOT 0.6 09/30/2013   PSA 8.0 a from 09/07/2014.   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  I explained to the patient and his wife that his prognosis is related to his stage, PSA level, and Gleason score.  His stage and PSA level are favorable while his Gleason score of 7 is of intermediate favorability.  Other prognostic factors include PSA doubling time and disease volume.  Management options include robotic surgery versus observation/close surveillance versus radiation therapy.  His radiation therapy option is limited to 8 weeks of external beam/IMRT.  He is not a candidate for a prostate seed implant boost based on his moderate obstructive symptomatology with an I PSS score of 16 and also his medical comorbidities.  We briefly discussed short-term androgen deprivation therapy.  This is being currently looked at in a national trial which was recently closed (RTOG).  Based on available data, androgen deprivation therapy, even short-term, may lead to a low risk for cardiac toxicity, and I do not recommend this.  It is unclear as to whether not he is a candidate for surgery, and the patient indicates that he may be a candidate for surgery.  We discussed the potential acute and late toxicities of external beam/IMRT.  We discussed placement of 3 gold seed markers for image guidance.  We also discussed bladder filling to minimize urinary toxicity.  I'll speak with Dr. Alyson Ingles tomorrow when he returns to the office.  It is unclear to me as to what his life expectancy is.  If he is expected to live at least 5-10 years, then I would suggest that he consider potentially curative treatment whether this be surgery or radiation therapy.  With his medical comorbidities, he may be at high risk for prostatectomy.  With my  retirement later in December, I would have him see Dr. [name] for a follow-up visit after placement of 3 gold seed markers if he chooses radiation therapy.   PLAN: As discussed above.  I'll speak with Dr.McKenzie tomorrow.   I spent 60 minutes face to face with the patient and more than 50% of that time was spent in counseling and/or coordination of care.

## 2015-01-16 NOTE — Addendum Note (Signed)
Encounter addended by: Benn Moulder, RN on: 01/16/2015  4:47 PM<BR>     Documentation filed: [facility] Follow-up Actions, Flowsheet VN, Dx Association, Orders

## 2015-01-19 ENCOUNTER — Encounter (HOSPITAL_COMMUNITY): Payer: Self-pay | Admitting: Emergency Medicine

## 2015-01-19 ENCOUNTER — Inpatient Hospital Stay (HOSPITAL_COMMUNITY)
Admission: EM | Admit: 2015-01-19 | Discharge: 2015-01-23 | DRG: 291 | Disposition: A | Discharge status: Home / Self Care | Payer: Medicare HMO | Attending: Cardiovascular Disease | Admitting: Cardiovascular Disease

## 2015-01-19 ENCOUNTER — Emergency Department (HOSPITAL_COMMUNITY): Payer: Medicare HMO

## 2015-01-19 DIAGNOSIS — Z800 Family history of malignant neoplasm of digestive organs: Secondary | ICD-10-CM | POA: Diagnosis not present

## 2015-01-19 DIAGNOSIS — G4700 Insomnia, unspecified: Secondary | ICD-10-CM | POA: Diagnosis present

## 2015-01-19 DIAGNOSIS — Z823 Family history of stroke: Secondary | ICD-10-CM | POA: Diagnosis not present

## 2015-01-19 DIAGNOSIS — F411 Generalized anxiety disorder: Secondary | ICD-10-CM | POA: Diagnosis present

## 2015-01-19 DIAGNOSIS — I480 Paroxysmal atrial fibrillation: Secondary | ICD-10-CM | POA: Diagnosis present

## 2015-01-19 DIAGNOSIS — Z811 Family history of alcohol abuse and dependence: Secondary | ICD-10-CM | POA: Diagnosis not present

## 2015-01-19 DIAGNOSIS — R000 Tachycardia, unspecified: Secondary | ICD-10-CM

## 2015-01-19 DIAGNOSIS — F329 Major depressive disorder, single episode, unspecified: Secondary | ICD-10-CM | POA: Diagnosis present

## 2015-01-19 DIAGNOSIS — I471 Supraventricular tachycardia: Secondary | ICD-10-CM | POA: Diagnosis not present

## 2015-01-19 DIAGNOSIS — F1490 Cocaine use, unspecified, uncomplicated: Secondary | ICD-10-CM | POA: Diagnosis present

## 2015-01-19 DIAGNOSIS — Z888 Allergy status to other drugs, medicaments and biological substances status: Secondary | ICD-10-CM

## 2015-01-19 DIAGNOSIS — Z8249 Family history of ischemic heart disease and other diseases of the circulatory system: Secondary | ICD-10-CM

## 2015-01-19 DIAGNOSIS — Z87891 Personal history of nicotine dependence: Secondary | ICD-10-CM | POA: Diagnosis not present

## 2015-01-19 DIAGNOSIS — I420 Dilated cardiomyopathy: Secondary | ICD-10-CM | POA: Diagnosis present

## 2015-01-19 DIAGNOSIS — K210 Gastro-esophageal reflux disease with esophagitis: Secondary | ICD-10-CM | POA: Diagnosis present

## 2015-01-19 DIAGNOSIS — K449 Diaphragmatic hernia without obstruction or gangrene: Secondary | ICD-10-CM | POA: Diagnosis present

## 2015-01-19 DIAGNOSIS — N182 Chronic kidney disease, stage 2 (mild): Secondary | ICD-10-CM | POA: Diagnosis present

## 2015-01-19 DIAGNOSIS — I130 Hypertensive heart and chronic kidney disease with heart failure and stage 1 through stage 4 chronic kidney disease, or unspecified chronic kidney disease: Principal | ICD-10-CM | POA: Diagnosis present

## 2015-01-19 DIAGNOSIS — M1388 Other specified arthritis, other site: Secondary | ICD-10-CM | POA: Diagnosis present

## 2015-01-19 DIAGNOSIS — F1290 Cannabis use, unspecified, uncomplicated: Secondary | ICD-10-CM | POA: Diagnosis present

## 2015-01-19 DIAGNOSIS — Z950 Presence of cardiac pacemaker: Secondary | ICD-10-CM | POA: Diagnosis not present

## 2015-01-19 DIAGNOSIS — Z801 Family history of malignant neoplasm of trachea, bronchus and lung: Secondary | ICD-10-CM | POA: Diagnosis not present

## 2015-01-19 DIAGNOSIS — J449 Chronic obstructive pulmonary disease, unspecified: Secondary | ICD-10-CM | POA: Diagnosis present

## 2015-01-19 DIAGNOSIS — Z8042 Family history of malignant neoplasm of prostate: Secondary | ICD-10-CM | POA: Diagnosis not present

## 2015-01-19 DIAGNOSIS — Z833 Family history of diabetes mellitus: Secondary | ICD-10-CM

## 2015-01-19 DIAGNOSIS — Z9119 Patient's noncompliance with other medical treatment and regimen: Secondary | ICD-10-CM

## 2015-01-19 DIAGNOSIS — M109 Gout, unspecified: Secondary | ICD-10-CM | POA: Diagnosis present

## 2015-01-19 DIAGNOSIS — C61 Malignant neoplasm of prostate: Secondary | ICD-10-CM | POA: Diagnosis present

## 2015-01-19 DIAGNOSIS — I5023 Acute on chronic systolic (congestive) heart failure: Secondary | ICD-10-CM | POA: Diagnosis present

## 2015-01-19 DIAGNOSIS — I5021 Acute systolic (congestive) heart failure: Secondary | ICD-10-CM | POA: Diagnosis present

## 2015-01-19 DIAGNOSIS — R1011 Right upper quadrant pain: Secondary | ICD-10-CM

## 2015-01-19 LAB — CBC WITH DIFFERENTIAL/PLATELET
BASOS ABS: 0.1 10*3/uL (ref 0.0–0.1)
Basophils Relative: 1 %
EOS PCT: 2 %
Eosinophils Absolute: 0.1 10*3/uL (ref 0.0–0.7)
HEMATOCRIT: 41 % (ref 39.0–52.0)
HEMOGLOBIN: 13.5 g/dL (ref 13.0–17.0)
LYMPHS PCT: 28 %
Lymphs Abs: 1.5 10*3/uL (ref 0.7–4.0)
MCH: 33 pg (ref 26.0–34.0)
MCHC: 32.9 g/dL (ref 30.0–36.0)
MCV: 100.2 fL — AB (ref 78.0–100.0)
Monocytes Absolute: 0.4 10*3/uL (ref 0.1–1.0)
Monocytes Relative: 8 %
NEUTROS ABS: 3.3 10*3/uL (ref 1.7–7.7)
NEUTROS PCT: 61 %
PLATELETS: 225 10*3/uL (ref 150–400)
RBC: 4.09 MIL/uL — AB (ref 4.22–5.81)
RDW: 13.1 % (ref 11.5–15.5)
WBC: 5.4 10*3/uL (ref 4.0–10.5)

## 2015-01-19 LAB — I-STAT VENOUS BLOOD GAS, ED
ACID-BASE DEFICIT: 4 mmol/L — AB (ref 0.0–2.0)
BICARBONATE: 17.8 meq/L — AB (ref 20.0–24.0)
O2 SAT: 99 %
PO2 VEN: 154 mmHg — AB (ref 30.0–45.0)
TCO2: 19 mmol/L (ref 0–100)
pCO2, Ven: 26.2 mmHg — ABNORMAL LOW (ref 45.0–50.0)
pH, Ven: 7.44 — ABNORMAL HIGH (ref 7.250–7.300)

## 2015-01-19 LAB — CBC
HEMATOCRIT: 42 % (ref 39.0–52.0)
Hemoglobin: 13.7 g/dL (ref 13.0–17.0)
MCH: 33.1 pg (ref 26.0–34.0)
MCHC: 32.6 g/dL (ref 30.0–36.0)
MCV: 101.4 fL — AB (ref 78.0–100.0)
Platelets: 227 10*3/uL (ref 150–400)
RBC: 4.14 MIL/uL — AB (ref 4.22–5.81)
RDW: 13.1 % (ref 11.5–15.5)
WBC: 5.7 10*3/uL (ref 4.0–10.5)

## 2015-01-19 LAB — HEPATIC FUNCTION PANEL
ALBUMIN: 3.4 g/dL — AB (ref 3.5–5.0)
ALK PHOS: 76 U/L (ref 38–126)
ALT: 48 U/L (ref 17–63)
AST: 52 U/L — ABNORMAL HIGH (ref 15–41)
BILIRUBIN INDIRECT: 1.6 mg/dL — AB (ref 0.3–0.9)
BILIRUBIN TOTAL: 2.6 mg/dL — AB (ref 0.3–1.2)
Bilirubin, Direct: 1 mg/dL — ABNORMAL HIGH (ref 0.1–0.5)
TOTAL PROTEIN: 6.2 g/dL — AB (ref 6.5–8.1)

## 2015-01-19 LAB — I-STAT TROPONIN, ED
Troponin i, poc: 0.07 ng/mL (ref 0.00–0.08)
Troponin i, poc: 0.07 ng/mL (ref 0.00–0.08)

## 2015-01-19 LAB — TROPONIN I
TROPONIN I: 0.06 ng/mL — AB (ref <0.031)
Troponin I: 0.05 ng/mL — ABNORMAL HIGH (ref <0.031)
Troponin I: 0.06 ng/mL — ABNORMAL HIGH (ref <0.031)

## 2015-01-19 LAB — BASIC METABOLIC PANEL
ANION GAP: 9 (ref 5–15)
BUN: 34 mg/dL — AB (ref 6–20)
CHLORIDE: 106 mmol/L (ref 101–111)
CO2: 24 mmol/L (ref 22–32)
Calcium: 9.6 mg/dL (ref 8.9–10.3)
Creatinine, Ser: 1.99 mg/dL — ABNORMAL HIGH (ref 0.61–1.24)
GFR, EST AFRICAN AMERICAN: 39 mL/min — AB (ref >60)
GFR, EST NON AFRICAN AMERICAN: 34 mL/min — AB (ref >60)
Glucose, Bld: 134 mg/dL — ABNORMAL HIGH (ref 65–99)
POTASSIUM: 4 mmol/L (ref 3.5–5.1)
SODIUM: 139 mmol/L (ref 135–145)

## 2015-01-19 LAB — MRSA PCR SCREENING: MRSA by PCR: NEGATIVE

## 2015-01-19 LAB — PROTIME-INR
INR: 2.25 — AB (ref 0.00–1.49)
PROTHROMBIN TIME: 24.6 s — AB (ref 11.6–15.2)

## 2015-01-19 LAB — BRAIN NATRIURETIC PEPTIDE: B Natriuretic Peptide: 1387.7 pg/mL — ABNORMAL HIGH (ref 0.0–100.0)

## 2015-01-19 LAB — D-DIMER, QUANTITATIVE: D-Dimer, Quant: 1.21 ug/mL-FEU — ABNORMAL HIGH (ref 0.00–0.50)

## 2015-01-19 LAB — LIPASE, BLOOD: LIPASE: 56 U/L — AB (ref 11–51)

## 2015-01-19 MED ORDER — METOPROLOL TARTRATE 1 MG/ML IV SOLN
5.0000 mg | Freq: Once | INTRAVENOUS | Status: AC
  Administered 2015-01-19: 5 mg via INTRAVENOUS
  Filled 2015-01-19: qty 5

## 2015-01-19 MED ORDER — FAMOTIDINE IN NACL 20-0.9 MG/50ML-% IV SOLN
20.0000 mg | Freq: Once | INTRAVENOUS | Status: AC
  Administered 2015-01-19: 20 mg via INTRAVENOUS
  Filled 2015-01-19: qty 50

## 2015-01-19 MED ORDER — LISINOPRIL 10 MG PO TABS
10.0000 mg | ORAL_TABLET | Freq: Every day | ORAL | Status: DC
  Administered 2015-01-19 – 2015-01-23 (×5): 10 mg via ORAL
  Filled 2015-01-19 (×5): qty 1

## 2015-01-19 MED ORDER — METOPROLOL TARTRATE 1 MG/ML IV SOLN
2.5000 mg | Freq: Once | INTRAVENOUS | Status: AC
  Administered 2015-01-19: 2.5 mg via INTRAVENOUS
  Filled 2015-01-19: qty 5

## 2015-01-19 MED ORDER — ROSUVASTATIN CALCIUM 10 MG PO TABS
10.0000 mg | ORAL_TABLET | ORAL | Status: DC
  Administered 2015-01-21 – 2015-01-23 (×2): 10 mg via ORAL
  Filled 2015-01-19 (×2): qty 1

## 2015-01-19 MED ORDER — SODIUM CHLORIDE 0.9 % IJ SOLN
3.0000 mL | Freq: Two times a day (BID) | INTRAMUSCULAR | Status: DC
  Administered 2015-01-19 – 2015-01-23 (×8): 3 mL via INTRAVENOUS

## 2015-01-19 MED ORDER — SODIUM CHLORIDE 0.9 % IJ SOLN: 3.0000 mL | INTRAMUSCULAR | Status: DC | PRN

## 2015-01-19 MED ORDER — MECLIZINE HCL 12.5 MG PO TABS
12.5000 mg | ORAL_TABLET | Freq: Two times a day (BID) | ORAL | Status: DC
  Administered 2015-01-19 – 2015-01-23 (×8): 12.5 mg via ORAL
  Filled 2015-01-19 (×8): qty 1

## 2015-01-19 MED ORDER — GI COCKTAIL ~~LOC~~
30.0000 mL | Freq: Once | ORAL | Status: AC
Start: 2015-01-19 — End: 2015-01-19
  Administered 2015-01-19: 30 mL via ORAL
  Filled 2015-01-19: qty 30

## 2015-01-19 MED ORDER — DIGOXIN 0.25 MG/ML IJ SOLN
0.2500 mg | Freq: Once | INTRAMUSCULAR | Status: AC
  Administered 2015-01-19: 0.25 mg via INTRAVENOUS
  Filled 2015-01-19: qty 2

## 2015-01-19 MED ORDER — ONDANSETRON HCL 4 MG/2ML IJ SOLN: 4.0000 mg | Freq: Four times a day (QID) | INTRAMUSCULAR | Status: DC | PRN

## 2015-01-19 MED ORDER — LORAZEPAM 1 MG PO TABS
1.0000 mg | ORAL_TABLET | Freq: Once | ORAL | Status: AC
  Administered 2015-01-19: 1 mg via ORAL
  Filled 2015-01-19 (×2): qty 1

## 2015-01-19 MED ORDER — METOPROLOL TARTRATE 1 MG/ML IV SOLN: 2.5000 mg | Freq: Once | INTRAVENOUS | Status: DC

## 2015-01-19 MED ORDER — ACETAMINOPHEN 325 MG PO TABS: 650.0000 mg | ORAL_TABLET | ORAL | Status: DC | PRN

## 2015-01-19 MED ORDER — FUROSEMIDE 10 MG/ML IJ SOLN
40.0000 mg | Freq: Two times a day (BID) | INTRAMUSCULAR | Status: DC
  Administered 2015-01-19 – 2015-01-22 (×6): 40 mg via INTRAVENOUS
  Filled 2015-01-19 (×7): qty 4

## 2015-01-19 MED ORDER — SODIUM CHLORIDE 0.9 % IV SOLN: 250.0000 mL | INTRAVENOUS | Status: DC | PRN

## 2015-01-19 MED ORDER — ADULT MULTIVITAMIN W/MINERALS CH
1.0000 | ORAL_TABLET | Freq: Every day | ORAL | Status: DC
  Administered 2015-01-20 – 2015-01-23 (×4): 1 via ORAL
  Filled 2015-01-19 (×4): qty 1

## 2015-01-19 MED ORDER — CARVEDILOL 6.25 MG PO TABS
6.2500 mg | ORAL_TABLET | Freq: Two times a day (BID) | ORAL | Status: DC
  Administered 2015-01-19 – 2015-01-20 (×2): 6.25 mg via ORAL
  Filled 2015-01-19 (×3): qty 1

## 2015-01-19 MED ORDER — ACETAMINOPHEN 500 MG PO TABS
1000.0000 mg | ORAL_TABLET | Freq: Once | ORAL | Status: DC
  Filled 2015-01-19: qty 2

## 2015-01-19 MED ORDER — SPIRONOLACTONE 25 MG PO TABS
12.5000 mg | ORAL_TABLET | Freq: Every day | ORAL | Status: DC
  Administered 2015-01-19 – 2015-01-23 (×5): 12.5 mg via ORAL
  Filled 2015-01-19 (×5): qty 1

## 2015-01-19 MED ORDER — OXYCODONE HCL 5 MG PO TABS
5.0000 mg | ORAL_TABLET | Freq: Two times a day (BID) | ORAL | Status: DC | PRN
  Administered 2015-01-21 – 2015-01-22 (×4): 5 mg via ORAL
  Filled 2015-01-19 (×4): qty 1

## 2015-01-19 MED ORDER — ADULT MULTIVITAMIN W/MINERALS CH
0.5000 | ORAL_TABLET | Freq: Two times a day (BID) | ORAL | Status: DC
  Administered 2015-01-19: 0.5 via ORAL
  Filled 2015-01-19: qty 1

## 2015-01-19 MED ORDER — FAMOTIDINE 20 MG PO TABS
20.0000 mg | ORAL_TABLET | Freq: Two times a day (BID) | ORAL | Status: DC
Start: 2015-01-19 — End: 2015-01-23
  Administered 2015-01-19 – 2015-01-23 (×8): 20 mg via ORAL
  Filled 2015-01-19 (×8): qty 1

## 2015-01-19 MED ORDER — OXYCODONE HCL 5 MG PO TABS
5.0000 mg | ORAL_TABLET | Freq: Once | ORAL | Status: AC
  Administered 2015-01-19: 5 mg via ORAL
  Filled 2015-01-19: qty 1

## 2015-01-19 MED ORDER — EDOXABAN TOSYLATE 30 MG PO TABS
30.0000 mg | ORAL_TABLET | Freq: Every day | ORAL | Status: DC
  Administered 2015-01-20 – 2015-01-23 (×4): 30 mg via ORAL
  Filled 2015-01-19 (×6): qty 1

## 2015-01-19 MED ORDER — ALLOPURINOL 100 MG PO TABS
100.0000 mg | ORAL_TABLET | Freq: Every day | ORAL | Status: DC
  Administered 2015-01-19 – 2015-01-23 (×5): 100 mg via ORAL
  Filled 2015-01-19 (×5): qty 1

## 2015-01-19 MED ORDER — PANTOPRAZOLE SODIUM 40 MG IV SOLR
40.0000 mg | INTRAVENOUS | Status: AC
  Administered 2015-01-19: 40 mg via INTRAVENOUS
  Filled 2015-01-19: qty 40

## 2015-01-19 NOTE — ED Notes (Signed)
Daily meds requested from pharmacy.  

## 2015-01-19 NOTE — ED Notes (Signed)
Unable to get temp at this time. °

## 2015-01-19 NOTE — Progress Notes (Signed)
Educated pt.  Pt refused pm medications at this time.  bp 126/98 Hr 106.  Will continue to monitor. Saunders Revel T

## 2015-01-19 NOTE — H&P (Signed)
Referring Physician:  JANAI BRANNIGAN is an 64 y.o. male.                       Chief Complaint: Shortness of breath  HPI: 64 year old male with hypertension, acid reflex, dilated cardiomyopathy, chronic systolic heart failure, recently diagnosed prostate cancer, paroxysmal atrial fibrillation with CHA2DSVA2Sc score of 3/9 on Edoxaban, CKD, II, S/P ICD placement has 1 week history of shortness of breath with exertion. BNP elevated at 1387.7.  Past Medical History  Diagnosis Date  . Hypertension   . GERD (gastroesophageal reflux disease)   . Hiatal hernia   . CHF (congestive heart failure) ([place])   . Cardiomyopathy ([place])     nonischemic   . COPD (chronic obstructive pulmonary disease) ([place])     previously listed in DC summary from Feb 2012   . Chronic kidney disease (CKD), stage II (mild)     listed in chart although patient denies   . Atrial fibrillation ([place])      previously listed in DC summary from August 31, 2011, ? atrial tachycardia   . SVT (supraventricular tachycardia) ([place])   . Heart murmur     "dx'd in middle school" (12/22/2012)  . Pneumonia     "once when I was a baby; probably 3-4 times since then" (12/22/2012)  . Shortness of breath     "all the time last couple months" (12/22/2012)  . LYYTKPTW(656.8)     "weekly" (01/01/2013)  . Arthritis     "neck" (12/22/2012)  . Gout   . Depression   . Presence of permanent cardiac pacemaker     st jude  . Prostate cancer ([place]) 12/13/14      Past Surgical History  Procedure Laterality Date  . Tee without cardioversion N/A 05/11/2012    Procedure: TRANSESOPHAGEAL ECHOCARDIOGRAM (TEE);  Surgeon: Birdie Riddle, MD;  Location[facility] ENDOSCOPY;  Service: Cardiovascular;  Laterality: N/A;  . Cardioversion N/A 05/11/2012    Procedure: Electrocardioversion, external;  Surgeon: Birdie Riddle, MD;  Location: [place];  Service: Cardiovascular;  Laterality: N/A;  . Cardioversion N/A 05/11/2012    Procedure: CARDIOVERSION AT THE BEDSIDE;   Surgeon: Birdie Riddle, MD;  Location: [place];  Service: Cardiovascular;  Laterality: N/A;  . Supraventricular tachycardia ablation  08/2011; 09/2011  . Cardiac catheterization  08/2011  . Left heart catheterization with coronary angiogram N/A 02/02/2011    Procedure: LEFT HEART CATHETERIZATION WITH CORONARY ANGIOGRAM;  Surgeon: Birdie Riddle, MD;  Location: [place] CATH LAB;  Service: Cardiovascular;  Laterality: N/A;  . Right heart catheterization N/A 09/10/2011    Procedure: RIGHT HEART CATH;  Surgeon: Jolaine Artist, MD;  Location: [facility] CATH LAB;  Service: Cardiovascular;  Laterality: N/A;  . Supraventricular tachycardia ablation N/A 09/14/2011    Procedure: SUPRAVENTRICULAR TACHYCARDIA ABLATION;  Surgeon: Evans Lance, MD;  Location: [facility] CATH LAB;  Service: Cardiovascular;  Laterality: N/A;  . Supraventricular tachycardia ablation N/A 09/21/2011    Procedure: SUPRAVENTRICULAR TACHYCARDIA ABLATION;  Surgeon: Evans Lance, MD;  Location: [facility] CATH LAB;  Service: Cardiovascular;  Laterality: N/A;  . Temporary pacemaker insertion N/A 09/30/2013    Procedure: TEMPORARY PACEMAKER INSERTION;  Surgeon: Birdie Riddle, MD;  Location: [place] CATH LAB;  Service: Cardiovascular;  Laterality: N/A;  . Left heart catheterization with coronary angiogram  09/30/2013    Procedure: LEFT HEART CATHETERIZATION WITH CORONARY ANGIOGRAM;  Surgeon: Birdie Riddle, MD;  Location: [place] CATH LAB;  Service: Cardiovascular;;  .  Bi-ventricular pacemaker insertion N/A 10/05/2013    Procedure: BI-VENTRICULAR PACEMAKER INSERTION (CRT-P);  Surgeon: Evans Lance, MD;  Location: [facility] CATH LAB;  Service: Cardiovascular;  Laterality: N/A;  . Colonoscopy with propofol N/A 03/15/2014    Procedure: COLONOSCOPY WITH PROPOFOL;  Surgeon: Milus Banister, MD;  Location: [facility] ENDOSCOPY;  Service: Endoscopy;  Laterality: N/A;    Family History  Problem Relation Age of Onset  . Lung cancer Mother     passed away from lung cancer  . Pneumonia Father      passed sway from PNA.  . Stroke Sister   . Diabetes Sister     1/2 sister  . Seizures Brother     x 2 brothers  . Liver cancer Brother     mets, Hep C, kidney disease  . Prostate cancer Paternal Grandfather   . Alcohol abuse Father   . Colon cancer Maternal Aunt   . Colon polyps Cousin   . Heart failure Mother     CHF  . Other Maternal Aunt     brain tumor   Social History:  reports that he quit smoking about 3 years ago. His smoking use included Cigarettes. He has a 3.75 pack-year smoking history. He has never used smokeless tobacco. He reports that he drinks about 3.6 oz of alcohol per week. He reports that he uses illicit drugs (Cocaine and Marijuana).  Allergies:  Allergies  Allergen Reactions  . Ibuprofen Other (See Comments)    Makes heart race     (Not in a hospital admission)  Results for orders placed or performed during the hospital encounter of 01/19/15 (from the past 48 hour(s))  Basic metabolic panel     Status: Abnormal   Collection Time: 01/19/15  3:32 AM  Result Value Ref Range   Sodium 139 135 - 145 mmol/L   Potassium 4.0 3.5 - 5.1 mmol/L   Chloride 106 101 - 111 mmol/L   CO2 24 22 - 32 mmol/L   Glucose, Bld 134 (H) 65 - 99 mg/dL   BUN 34 (H) 6 - 20 mg/dL   Creatinine, Ser 1.99 (H) 0.61 - 1.24 mg/dL   Calcium 9.6 8.9 - 10.3 mg/dL   GFR calc non Af Amer 34 (L) >60 mL/min   GFR calc Af Amer 39 (L) >60 mL/min    Comment: (NOTE) The eGFR has been calculated using the CKD EPI equation. This calculation has not been validated in all clinical situations. eGFR's persistently <60 mL/min signify possible Chronic Kidney Disease.    Anion gap 9 5 - 15  CBC     Status: Abnormal   Collection Time: 01/19/15  3:32 AM  Result Value Ref Range   WBC 5.7 4.0 - 10.5 K/uL   RBC 4.14 (L) 4.22 - 5.81 MIL/uL   Hemoglobin 13.7 13.0 - 17.0 g/dL   HCT 42.0 39.0 - 52.0 %   MCV 101.4 (H) 78.0 - 100.0 fL   MCH 33.1 26.0 - 34.0 pg   MCHC 32.6 30.0 - 36.0 g/dL   RDW 13.1  11.5 - 15.5 %   Platelets 227 150 - 400 K/uL  Protime-INR - (order if Patient is taking Coumadin / Warfarin)     Status: Abnormal   Collection Time: 01/19/15  3:32 AM  Result Value Ref Range   Prothrombin Time 24.6 (H) 11.6 - 15.2 seconds   INR 2.25 (H) 0.00 - 1.49  Brain natriuretic peptide     Status: Abnormal   Collection Time: 01/19/15  3:32 AM  Result Value Ref Range   B Natriuretic Peptide 1387.7 (H) 0.0 - 100.0 pg/mL  I-stat troponin, ED (not at [facility])     Status: None   Collection Time: 01/19/15  4:01 AM  Result Value Ref Range   Troponin i, poc 0.07 0.00 - 0.08 ng/mL   Comment 3            Comment: Due to the release kinetics of cTnI, a negative result within the first hours of the onset of symptoms does not rule out myocardial infarction with certainty. If myocardial infarction is still suspected, repeat the test at appropriate intervals.   Hepatic function panel     Status: Abnormal   Collection Time: 01/19/15  4:03 AM  Result Value Ref Range   Total Protein 6.2 (L) 6.5 - 8.1 g/dL   Albumin 3.4 (L) 3.5 - 5.0 g/dL   AST 52 (H) 15 - 41 U/L   ALT 48 17 - 63 U/L   Alkaline Phosphatase 76 38 - 126 U/L   Total Bilirubin 2.6 (H) 0.3 - 1.2 mg/dL   Bilirubin, Direct 1.0 (H) 0.1 - 0.5 mg/dL   Indirect Bilirubin 1.6 (H) 0.3 - 0.9 mg/dL  Lipase, blood     Status: Abnormal   Collection Time: 01/19/15  4:03 AM  Result Value Ref Range   Lipase 56 (H) 11 - 51 U/L   Dg Chest 2 View  01/19/2015  CLINICAL DATA:  Dyspnea and weakness since having a prostate biopsy on 01/11/2015. EXAM: CHEST  2 VIEW COMPARISON:  10/18/2013 FINDINGS: There is marked cardiomegaly. There are intact appearances of the biventricular transvenous pacing leads. The lungs are clear. There is no pleural effusion. There is moderate central vascular prominence which is not significantly changed. No interstitial or alveolar edema. Widely scattered tiny granulomata are again evident. IMPRESSION: Cardiomegaly.  Mild chronic appearing central vascular prominence. The lungs are clear except for scattered tiny benign granulomata. Electronically Signed   By: Andreas Newport M.D.   On: 01/19/2015 04:10   US Abdomen Limited Ruq  01/19/2015  CLINICAL DATA:  Right upper quadrant pain and dyspnea, sudden onset. EXAM: US ABDOMEN LIMITED - RIGHT UPPER QUADRANT COMPARISON:  None. FINDINGS: Gallbladder: No calculi are visible. There is mural thickening and edema, measuring up to 8 mm. The patient did not seem to be tender over the gallbladder. Common bile duct: Diameter: Normal, 5.5 mm. Liver: Generalized echogenicity of hepatic parenchyma without focal lesion. This could represent fatty infiltration or other hepatocellular disease. IMPRESSION: Gallbladder mural thickening.  No conclusive calculi or tenderness. Echogenic liver, probably fatty infiltration. Electronically Signed   By: Andreas Newport M.D.   On: 01/19/2015 04:52    Review Of Systems Constitutional: Negative for fever and chills.  HENT: Negative for neck pain and neck stiffness.  Eyes: Negative for pain.  Respiratory: Positive for shortness of breath.  Cardiovascular: Positive for chest pain.  Gastrointestinal: Negative for abdominal pain.  Genitourinary: Negative for dysuria.  Musculoskeletal: Positive for back pain. No leg edema  Skin: Negative for rash.  Neurological: Positive for headaches.  Blood pressure 109/84, pulse 98, resp. rate 20, SpO2 98 %. Gen: Averagely built and nourished. Mild respiratory distress.  HENT: Head: Normocephalic and atraumatic. Eyes: Brown eyes, Conjunctivae and EOM are normal. Pupils are equal, round, and reactive to light.  Neck: Normal range of motion. Neck supple. Normal carotid pulses and full JVD present.  Cardiovascular: S1 normal, S2 normal and irregular pulses. II/VI systolic  murmur at left sternal border. Pulmonary/Chest: Lungs- basal fine crackles, bilateral to auscultation. No tenderness on  palpation  Abdominal: Bowel sounds are normal. Soft and epi-gastric-tenderness. Musculoskeletal: Normal range of motion. No edema. Neurological: He is alert and oriented to person, place, and time.  Skin: Skin is warm, dry and intact. No rash noted. No cyanosis. + clubbing.  Psychiatric: Some anxiety   Assessment/Plan Acute on chronic left heart systolic failure Paroxysmal atrial flutter CHA2DS2VASC of 3/9 Dilated cardiomyopathy.  S/P SVT ablation x 2  S/P ICD placement Hiatal hernia with reflux esophagitis Prostate cancer CKD, II Gout  Admit/IV lasix/home medications  Towana Stenglein S, MD  01/19/2015, 8:34 AM

## 2015-01-19 NOTE — ED Notes (Signed)
Doylene Canard MD at bedside

## 2015-01-19 NOTE — ED Notes (Signed)
ED-PA to bedside  

## 2015-01-19 NOTE — ED Notes (Signed)
Doylene Canard MD returned page and made aware of pt condition.  Further verbal orders received.

## 2015-01-19 NOTE — ED Provider Notes (Signed)
CSN: [id number]     Arrival date & time 01/19/15  0305 History   First MD Initiated Contact with Patient 01/19/15 [phone]     Chief Complaint  Patient presents with  . Shortness of Breath     (Consider location/radiation/quality/duration/timing/severity/associated sxs/prior Treatment) HPI Comments: The patient is a 64 year old male, history of hypertension, acid reflux, he also has a history of congestive heart failure and COPD. He has atrial fibrillation, history of supraventricular tachycardia and has been diagnosed with prostate cancer within the last couple of weeks. He reports that on Friday, one week ago he underwent a transrectal biopsy of his prostate which confirmed the diagnosis. Since that time he reports that when he lays down he has discomfort, it is worse at night, is a pain in his epigastrium which seems to radiate to his back as well as his chest and states that he frequently wakes up at night feeling short of breath. He denies weight gain, denies swelling in the lower extremities, denies exertional symptoms.  Patient is a 64 y.o. male presenting with shortness of breath. The history is provided by the patient.  Shortness of Breath   Past Medical History  Diagnosis Date  . Hypertension   . GERD (gastroesophageal reflux disease)   . Hiatal hernia   . CHF (congestive heart failure) ([place])   . Cardiomyopathy ([place])     nonischemic   . COPD (chronic obstructive pulmonary disease) ([place])     previously listed in DC summary from Feb 2012   . Chronic kidney disease (CKD), stage II (mild)     listed in chart although patient denies   . Atrial fibrillation ([place])      previously listed in DC summary from August 31, 2011, ? atrial tachycardia   . SVT (supraventricular tachycardia) ([place])   . Heart murmur     "dx'd in middle school" (12/22/2012)  . Pneumonia     "once when I was a baby; probably 3-4 times since then" (12/22/2012)  . Shortness of breath     "all the time last couple  months" (12/22/2012)  . [id number])     "weekly" (01/01/2013)  . Arthritis     "neck" (12/22/2012)  . Gout   . Depression   . Presence of permanent cardiac pacemaker     st jude  . Prostate cancer ([place]) 12/13/14   Past Surgical History  Procedure Laterality Date  . Tee without cardioversion N/A 05/11/2012    Procedure: TRANSESOPHAGEAL ECHOCARDIOGRAM (TEE);  Surgeon: Birdie Riddle, MD;  Location: [facility] ENDOSCOPY;  Service: Cardiovascular;  Laterality: N/A;  . Cardioversion N/A 05/11/2012    Procedure: Electrocardioversion, external;  Surgeon: Birdie Riddle, MD;  Location: [place];  Service: Cardiovascular;  Laterality: N/A;  . Cardioversion N/A 05/11/2012    Procedure: CARDIOVERSION AT THE BEDSIDE;  Surgeon: Birdie Riddle, MD;  Location: [place];  Service: Cardiovascular;  Laterality: N/A;  . Supraventricular tachycardia ablation  08/2011; 09/2011  . Cardiac catheterization  08/2011  . Left heart catheterization with coronary angiogram N/A 02/02/2011    Procedure: LEFT HEART CATHETERIZATION WITH CORONARY ANGIOGRAM;  Surgeon: Birdie Riddle, MD;  Location: [place] CATH LAB;  Service: Cardiovascular;  Laterality: N/A;  . Right heart catheterization N/A 09/10/2011    Procedure: RIGHT HEART CATH;  Surgeon: Jolaine Artist, MD;  Location: [facility] CATH LAB;  Service: Cardiovascular;  Laterality: N/A;  . Supraventricular tachycardia ablation N/A 09/14/2011    Procedure: SUPRAVENTRICULAR TACHYCARDIA ABLATION;  Surgeon: Evans Lance,  MD;  Location: [place] CATH LAB;  Service: Cardiovascular;  Laterality: N/A;  . Supraventricular tachycardia ablation N/A 09/21/2011    Procedure: SUPRAVENTRICULAR TACHYCARDIA ABLATION;  Surgeon: Evans Lance, MD;  Location: [facility] CATH LAB;  Service: Cardiovascular;  Laterality: N/A;  . Temporary pacemaker insertion N/A 09/30/2013    Procedure: TEMPORARY PACEMAKER INSERTION;  Surgeon: Birdie Riddle, MD;  Location: [place] CATH LAB;  Service: Cardiovascular;  Laterality: N/A;  . Left  heart catheterization with coronary angiogram  09/30/2013    Procedure: LEFT HEART CATHETERIZATION WITH CORONARY ANGIOGRAM;  Surgeon: Birdie Riddle, MD;  Location: [place] CATH LAB;  Service: Cardiovascular;;  . Bi-ventricular pacemaker insertion N/A 10/05/2013    Procedure: BI-VENTRICULAR PACEMAKER INSERTION (CRT-P);  Surgeon: Evans Lance, MD;  Location: [facility] CATH LAB;  Service: Cardiovascular;  Laterality: N/A;  . Colonoscopy with propofol N/A 03/15/2014    Procedure: COLONOSCOPY WITH PROPOFOL;  Surgeon: Milus Banister, MD;  Location: [facility] ENDOSCOPY;  Service: Endoscopy;  Laterality: N/A;   Family History  Problem Relation Age of Onset  . Lung cancer Mother     passed away from lung cancer  . Pneumonia Father     passed sway from PNA.  . Stroke Sister   . Diabetes Sister     1/2 sister  . Seizures Brother     x 2 brothers  . Liver cancer Brother     mets, Hep C, kidney disease  . Prostate cancer Paternal Grandfather   . Alcohol abuse Father   . Colon cancer Maternal Aunt   . Colon polyps Cousin   . Heart failure Mother     CHF  . Other Maternal Aunt     brain tumor   Social History  Substance Use Topics  . Smoking status: Former Smoker -- 0.25 packs/day for 15 years    Types: Cigarettes    Quit date: 07/17/2011  . Smokeless tobacco: Never Used  . Alcohol Use: 3.6 oz/week    6 Cans of beer per week     Comment: 12/22/2012 1, ~ 24oz beer q other day at most"    Review of Systems  Respiratory: Positive for shortness of breath.   All other systems reviewed and are negative.     Allergies  Ibuprofen  Home Medications   Prior to Admission medications   Medication Sig Start Date End Date Taking? Authorizing Provider  allopurinol (ZYLOPRIM) 100 MG tablet Take 100 mg by mouth daily.  07/21/13  Yes Historical Provider, MD  carvedilol (COREG) 6.25 MG tablet Take 6.25 mg by mouth 2 (two) times daily. 12/11/13  Yes Historical Provider, MD  Edoxaban Tosylate (SAVAYSA) 30 MG TABS  Take 30 mg by mouth daily.    Yes Historical Provider, MD  furosemide (LASIX) 40 MG tablet Take 40 mg by mouth daily.    Yes Historical Provider, MD  lisinopril (PRINIVIL,ZESTRIL) 20 MG tablet Take 0.5 tablets (10 mg total) by mouth daily. 03/29/13  Yes Dixie Dials, MD  meclizine (ANTIVERT) 12.5 MG tablet Take 12.5 mg by mouth 2 (two) times daily. 01/08/15  Yes Historical Provider, MD  Multiple Vitamin (MULTIVITAMIN WITH MINERALS) TABS tablet Take 0.5 tablets by mouth 2 (two) times daily.    Yes Historical Provider, MD  oxyCODONE (OXY IR/ROXICODONE) 5 MG immediate release tablet Take 5 mg by mouth 2 (two) times daily as needed for moderate pain or severe pain.   Yes Historical Provider, MD  ranitidine (ZANTAC) 150 MG tablet Take 150 mg by mouth 2 (  two) times daily. 01/08/15  Yes Historical Provider, MD  rosuvastatin (CRESTOR) 20 MG tablet Take 10 mg by mouth every Monday, Wednesday, and Friday.   Yes Historical Provider, MD  spironolactone (ALDACTONE) 25 MG tablet Take 12.5 mg by mouth daily.   Yes Historical Provider, MD   BP 112/96 mmHg  Pulse 98  Resp 20  SpO2 95% Physical Exam  Constitutional: He appears well-developed and well-nourished. No distress.  HENT:  Head: Normocephalic and atraumatic.  Mouth/Throat: Oropharynx is clear and moist. No oropharyngeal exudate.  Eyes: Conjunctivae and EOM are normal. Pupils are equal, round, and reactive to light. Right eye exhibits no discharge. Left eye exhibits no discharge. No scleral icterus.  Neck: Normal range of motion. Neck supple. No JVD present. No thyromegaly present.  Cardiovascular: Normal rate, regular rhythm, normal heart sounds and intact distal pulses.  Exam reveals no gallop and no friction rub.   No murmur heard. Pulmonary/Chest: Effort normal and breath sounds normal. No respiratory distress. He has no wheezes. He has no rales.  Abdominal: Soft. Bowel sounds are normal. He exhibits no distension and no mass. There is tenderness.   Epigastric pain, tender to palpation. , also in the right upper quadrant, mild guarding, no peritoneal signs  Musculoskeletal: Normal range of motion. He exhibits no edema or tenderness.  Lymphadenopathy:    He has no cervical adenopathy.  Neurological: He is alert. Coordination normal.  Skin: Skin is warm and dry. No rash noted. No erythema.  Psychiatric: He has a normal mood and affect. His behavior is normal.  Nursing note and vitals reviewed.   ED Course  Procedures (including critical care time) Labs Review Labs Reviewed  BASIC METABOLIC PANEL - Abnormal; Notable for the following:    Glucose, Bld 134 (*)    BUN 34 (*)    Creatinine, Ser 1.99 (*)    GFR calc non Af Amer 34 (*)    GFR calc Af Amer 39 (*)    All other components within normal limits  CBC - Abnormal; Notable for the following:    RBC 4.14 (*)    MCV 101.4 (*)    All other components within normal limits  PROTIME-INR - Abnormal; Notable for the following:    Prothrombin Time 24.6 (*)    INR 2.25 (*)    All other components within normal limits  BRAIN NATRIURETIC PEPTIDE - Abnormal; Notable for the following:    B Natriuretic Peptide 1387.7 (*)    All other components within normal limits  HEPATIC FUNCTION PANEL - Abnormal; Notable for the following:    Total Protein 6.2 (*)    Albumin 3.4 (*)    AST 52 (*)    Total Bilirubin 2.6 (*)    Bilirubin, Direct 1.0 (*)    Indirect Bilirubin 1.6 (*)    All other components within normal limits  LIPASE, BLOOD - Abnormal; Notable for the following:    Lipase 56 (*)    All other components within normal limits  I-STAT TROPOININ, ED    Imaging Review Dg Chest 2 View  01/19/2015  CLINICAL DATA:  Dyspnea and weakness since having a prostate biopsy on 01/11/2015. EXAM: CHEST  2 VIEW COMPARISON:  10/18/2013 FINDINGS: There is marked cardiomegaly. There are intact appearances of the biventricular transvenous pacing leads. The lungs are clear. There is no pleural  effusion. There is moderate central vascular prominence which is not significantly changed. No interstitial or alveolar edema. Widely scattered tiny granulomata are again evident.  IMPRESSION: Cardiomegaly. Mild chronic appearing central vascular prominence. The lungs are clear except for scattered tiny benign granulomata. Electronically Signed   By: Andreas Newport M.D.   On: 01/19/2015 04:10   US Abdomen Limited Ruq  01/19/2015  CLINICAL DATA:  Right upper quadrant pain and dyspnea, sudden onset. EXAM: US ABDOMEN LIMITED - RIGHT UPPER QUADRANT COMPARISON:  None. FINDINGS: Gallbladder: No calculi are visible. There is mural thickening and edema, measuring up to 8 mm. The patient did not seem to be tender over the gallbladder. Common bile duct: Diameter: Normal, 5.5 mm. Liver: Generalized echogenicity of hepatic parenchyma without focal lesion. This could represent fatty infiltration or other hepatocellular disease. IMPRESSION: Gallbladder mural thickening.  No conclusive calculi or tenderness. Echogenic liver, probably fatty infiltration. Electronically Signed   By: Andreas Newport M.D.   On: 01/19/2015 04:52   I have personally reviewed and evaluated these images and lab results as part of my medical decision-making.   EKG Interpretation   Date/Time:  Saturday January 19 2015 03:23:15 EST Ventricular Rate:  104 PR Interval:    QRS Duration: 124 QT Interval:  370 QTC Calculation: 486 R Axis:   -63 Text Interpretation:  Sinus Tachycardia, Left anterior  fascicular block ST \\T \ T wave abnormality, consider lateral ischemia  Abnormal ECG since last tracing no significant change Confirmed by Jailan Trimm   MD, Shi Blankenship ([id number]) on 01/19/2015 3:36:58 AM     MDM   Final diagnoses:  Tachycardia    Normal lung and heart sounds, tenderness in the epigastrium, EKG shows no new findings - intermittently with pulses into the 130's.  Check [place] to r/o chole, labs for pancreatitis- Gi cocktail for possible  PUD - trop, BNP and xray for Paroxysmal Nocturnal Dyspnea.  D/w [name] who will come to admit  Labs show some increased bilirubin, BNP and US showing some thickening of the GB wall  No improvement in tachycardia with BB.    Noemi Chapel, MD 01/19/15 [phone]

## 2015-01-19 NOTE — ED Notes (Signed)
Pt. reports SOB onset this week worse when lying on bed and eases when sitting up , denies cough or congestion , no fever or chills. Pt. stated history of CHF - his cardiologist is [name].

## 2015-01-19 NOTE — ED Notes (Signed)
Pt was attempting to use urinal when he became tachycardic at 167bpm.  RN to room and instructed pt to perform vagal maneuvers.  Pt heart rate decreased to 112.  Admitting MD paged to be made aware.  New EKG obtained and provided to ED provider.

## 2015-01-19 NOTE — ED Notes (Signed)
Inpatient nurse unavailable for report.  Will call back.

## 2015-01-19 NOTE — ED Notes (Signed)
Pt became diaphoretic but denies pain.  [place] MD at bedside assessing pt.  Admitting MD to be paged.

## 2015-01-19 NOTE — ED Notes (Signed)
Meal tray ordered for pt  

## 2015-01-19 NOTE — Progress Notes (Signed)
Md made aware of pt's refusing to take pm medications after being educated.  Pt did say he would wear SCD's if needed.  Will continue to monitor. Saunders Revel T

## 2015-01-19 NOTE — ED Notes (Signed)
Called pharmacy regarding pt daily meds.  Pharmacy to send up initial doses.

## 2015-01-19 NOTE — Progress Notes (Signed)
Patient c/o SHOB, hr 150's, diaphoretic, and soiled himself with 5 minutes of giving IV Lopressor. Dr Doylene Canard called and made aware of this episode and that patient refuses any additional dose of Lopressor. Patient now has HR of 100-110's and states he feels much better.   Loni Muse, RN

## 2015-01-20 LAB — BASIC METABOLIC PANEL
Anion gap: 13 (ref 5–15)
BUN: 47 mg/dL — AB (ref 6–20)
CHLORIDE: 104 mmol/L (ref 101–111)
CO2: 21 mmol/L — AB (ref 22–32)
CREATININE: 2.68 mg/dL — AB (ref 0.61–1.24)
Calcium: 9.5 mg/dL (ref 8.9–10.3)
GFR calc Af Amer: 27 mL/min — ABNORMAL LOW (ref >60)
GFR calc non Af Amer: 24 mL/min — ABNORMAL LOW (ref >60)
Glucose, Bld: 122 mg/dL — ABNORMAL HIGH (ref 65–99)
POTASSIUM: 4.6 mmol/L (ref 3.5–5.1)
Sodium: 138 mmol/L (ref 135–145)

## 2015-01-20 MED ORDER — CARVEDILOL 12.5 MG PO TABS
12.5000 mg | ORAL_TABLET | Freq: Two times a day (BID) | ORAL | Status: DC
  Administered 2015-01-20 – 2015-01-23 (×6): 12.5 mg via ORAL
  Filled 2015-01-20 (×6): qty 1

## 2015-01-20 MED ORDER — LORAZEPAM 0.5 MG PO TABS
0.5000 mg | ORAL_TABLET | Freq: Two times a day (BID) | ORAL | Status: DC
  Administered 2015-01-20 – 2015-01-23 (×6): 0.5 mg via ORAL
  Filled 2015-01-20 (×6): qty 1

## 2015-01-20 NOTE — Progress Notes (Signed)
Ref: [name]   Subjective:  Non-compliant in AM. Catching up on sleep. Afebrile. HR 90-110/min.  Objective:  Vital Signs in the last 24 hours: Temp:  [97.3 F (36.3 C)-98.8 F (37.1 C)] 97.3 F (36.3 C) (12/04 1133) Pulse Rate:  [62-118] 99 (12/04 1133) Cardiac Rhythm:  [-] Sinus tachycardia (12/04 0800) Resp:  [10-37] 18 (12/04 1133) BP: (95-136)/(84-108) 110/86 mmHg (12/04 1133) SpO2:  [96 %-100 %] 99 % (12/04 1133) Weight:  [68.493 kg (151 lb)] 68.493 kg (151 lb) (12/04 0302)  Physical Exam: BP Readings from Last 1 Encounters:  01/20/15 110/86    Wt Readings from Last 1 Encounters:  01/20/15 68.493 kg (151 lb)    Weight change:   HEENT: [place]/AT, Eyes-Brown, PERL, EOMI, Conjunctiva-Pink, Sclera-Non-icteric Neck: + JVD, No bruit, Trachea midline. Lungs:  Clearing, Bilateral. Cardiac:  Regular rhythm, normal S1 and S2, no S3. II/VI systolic murmur. Abdomen:  Soft, non-tender. Extremities:  No edema present. No cyanosis. + clubbing. CNS: AxOx3, Cranial nerves grossly intact, moves all 4 extremities. Right handed. Skin: Warm and dry.   Intake/Output from previous day: 12/03 0701 - 12/04 0700 In: 41 [P.O.:480; I.V.:3] Out: 450 [Urine:450]    Lab Results: BMET    Component Value Date/Time   NA 138 01/20/2015 0300   NA 139 01/19/2015 0332   NA 135* 10/07/2013 0347   K 4.6 01/20/2015 0300   K 4.0 01/19/2015 0332   K 4.5 10/07/2013 0347   CL 104 01/20/2015 0300   CL 106 01/19/2015 0332   CL 100 10/07/2013 0347   CO2 21* 01/20/2015 0300   CO2 24 01/19/2015 0332   CO2 24 10/07/2013 0347   GLUCOSE 122* 01/20/2015 0300   GLUCOSE 134* 01/19/2015 0332   GLUCOSE 97 10/07/2013 0347   BUN 47* 01/20/2015 0300   BUN 34* 01/19/2015 0332   BUN 18 10/07/2013 0347   CREATININE 2.68* 01/20/2015 0300   CREATININE 1.99* 01/19/2015 0332   CREATININE 1.43* 10/07/2013 0347   CALCIUM 9.5 01/20/2015 0300   CALCIUM 9.6 01/19/2015 0332   CALCIUM 9.4 10/07/2013 0347    GFRNONAA 24* 01/20/2015 0300   GFRNONAA 34* 01/19/2015 0332   GFRNONAA 51* 10/07/2013 0347   GFRAA 27* 01/20/2015 0300   GFRAA 39* 01/19/2015 0332   GFRAA 59* 10/07/2013 0347   CBC    Component Value Date/Time   WBC 5.4 01/19/2015 0843   RBC 4.09* 01/19/2015 0843   HGB 13.5 01/19/2015 0843   HCT 41.0 01/19/2015 0843   PLT 225 01/19/2015 0843   MCV 100.2* 01/19/2015 0843   MCH 33.0 01/19/2015 0843   MCHC 32.9 01/19/2015 0843   RDW 13.1 01/19/2015 0843   LYMPHSABS 1.5 01/19/2015 0843   MONOABS 0.4 01/19/2015 0843   EOSABS 0.1 01/19/2015 0843   BASOSABS 0.1 01/19/2015 0843   HEPATIC Function Panel  Recent Labs  01/19/15 0403  PROT 6.2*   HEMOGLOBIN A1C No components found for: HGA1C,  MPG CARDIAC ENZYMES Lab Results  Component Value Date   CKTOTAL 65 09/08/2011   CKMB 3.5 09/08/2011   TROPONINI 0.06* 01/19/2015   TROPONINI 0.06* 01/19/2015   TROPONINI 0.05* 01/19/2015   BNP No results for input(s): PROBNP in the last 8760 hours. TSH No results for input(s): TSH in the last 8760 hours. CHOLESTEROL No results for input(s): CHOL in the last 8760 hours.  Scheduled Meds: . acetaminophen  1,000 mg Oral Once  . allopurinol  100 mg Oral Daily  . carvedilol  12.5  mg Oral BID  . edoxaban  30 mg Oral Daily  . famotidine  20 mg Oral BID  . furosemide  40 mg Intravenous Q12H  . lisinopril  10 mg Oral Daily  . LORazepam  0.5 mg Oral BID  . meclizine  12.5 mg Oral BID  . multivitamin with minerals  1 tablet Oral Daily  . [START ON 01/21/2015] rosuvastatin  10 mg Oral Q M,W,F  . sodium chloride  3 mL Intravenous Q12H  . spironolactone  12.5 mg Oral Daily   Continuous Infusions:  PRN Meds:.sodium chloride, acetaminophen, ondansetron (ZOFRAN) IV, oxyCODONE, sodium chloride  Assessment/Plan: Acute on chronic left heart systolic failure Paroxysmal atrial flutter CHA2DS2VASC of 3/9 Dilated cardiomyopathy.  S/P SVT ablation x 2  S/P ICD placement Hiatal hernia with  reflux esophagitis Prostate cancer CKD, II Gout Generalized anxiety disorder Insomnia  Increase coreg to 12.5 mg. Twice daily. Smaller dose lorazepam bid.  Psych consult if not improving.   LOS: 1 day    Dixie Dials  MD  01/20/2015, 4:20 PM

## 2015-01-21 LAB — BASIC METABOLIC PANEL
ANION GAP: 10 (ref 5–15)
BUN: 52 mg/dL — AB (ref 6–20)
CO2: 22 mmol/L (ref 22–32)
Calcium: 9 mg/dL (ref 8.9–10.3)
Chloride: 105 mmol/L (ref 101–111)
Creatinine, Ser: 2.31 mg/dL — ABNORMAL HIGH (ref 0.61–1.24)
GFR calc Af Amer: 33 mL/min — ABNORMAL LOW (ref >60)
GFR, EST NON AFRICAN AMERICAN: 28 mL/min — AB (ref >60)
Glucose, Bld: 106 mg/dL — ABNORMAL HIGH (ref 65–99)
POTASSIUM: 3.8 mmol/L (ref 3.5–5.1)
SODIUM: 137 mmol/L (ref 135–145)

## 2015-01-21 MED ORDER — POTASSIUM CHLORIDE ER 10 MEQ PO TBCR
20.0000 meq | EXTENDED_RELEASE_TABLET | Freq: Once | ORAL | Status: AC
  Administered 2015-01-21: 20 meq via ORAL
  Filled 2015-01-21 (×2): qty 2

## 2015-01-21 MED ORDER — AMIODARONE HCL 200 MG PO TABS
400.0000 mg | ORAL_TABLET | Freq: Two times a day (BID) | ORAL | Status: DC
  Administered 2015-01-22 – 2015-01-23 (×3): 400 mg via ORAL
  Filled 2015-01-21 (×3): qty 2

## 2015-01-21 NOTE — Progress Notes (Signed)
Pt went into SVT, HR 160-190's. C/o mild shortness of breath and dizziness, occurred shortly after having a bowel movement. Converted to NSR HR 100 within 1-2 minutes. VSS. Dr Doylene Canard paged, no new orders at this time. Pt to receive morning medications now and monitor for any further arrhythmias.

## 2015-01-21 NOTE — Discharge Instructions (Signed)
Information on my medicine - Savaysa (Edoxaban)  Why was Savaysa  prescribed for you? [name]  was prescribed for you to reduce the risk of a blood clot forming that can cause a stroke if you have a medical condition called atrial fibrillation (a type of irregular heartbeat).  What do you need to know about Savaysa  ? Take your Savaysa ONCE DAILY at the same time every day. If you have difficulty swallowing the tablet whole, you may crush it and mix in applesauce just prior to taking your dose.  Take Savaysa exactly as prescribed by your doctor and DO NOT stop taking Savaysa without talking to the doctor who prescribed the medication.  Stopping without other stroke prevention medication to take the place of Savaysa may increase your risk of developing a clot that causes a stroke.  Refill your prescription before you run out.  After discharge, you should have regular check-up appointments with your healthcare provider that is prescribing your Savaysa .  In the future your dose may need to be changed if your kidney function or weight changes by a significant amount.  What do you do if you miss a dose? If you are taking Savaysa  ONCE DAILY and you miss a dose, take it as soon as you remember on the same day then continue your regularly scheduled once daily regimen the next day. Do not take two doses of Savaysa at the same time or on the same day.   Important Safety Information A possible side effect of Savaysa is bleeding. You should call your healthcare provider right away if you experience any of the following: ? Bleeding from an injury or your nose that does not stop. ? Unusual colored urine (red or dark brown) or unusual colored stools (red or black). ? Unusual bruising for unknown reasons. ? A serious fall or if you hit your head (even if there is no bleeding).  Some medicines may interact with Savaysa and might increase your risk of bleeding while on Savaysa . To help avoid this, consult  your healthcare provider or pharmacist prior to using any new prescription or non-prescription medications, including herbals, vitamins, non-steroidal anti-inflammatory drugs (NSAIDs) and supplements.

## 2015-01-21 NOTE — Progress Notes (Signed)
Ref: [name]   Subjective:  Feeling better. HR 90-110. Had episode of SVT. Positive for Hepatitis C per blood work per patient.  Objective:  Vital Signs in the last 24 hours: Temp:  [97.3 F (36.3 C)-97.4 F (36.3 C)] 97.4 F (36.3 C) (12/05 1651) Pulse Rate:  [66-102] 102 (12/05 1651) Cardiac Rhythm:  [-] Normal sinus rhythm;Sinus tachycardia (12/05 1100) Resp:  [15-32] 18 (12/05 1651) BP: (93-129)/(66-106) 104/66 mmHg (12/05 1651) SpO2:  [96 %-100 %] 99 % (12/05 1651) Weight:  [69.6 kg (153 lb 7 oz)] 69.6 kg (153 lb 7 oz) (12/05 0355)  Physical Exam: BP Readings from Last 1 Encounters:  01/21/15 104/66    Wt Readings from Last 1 Encounters:  01/21/15 69.6 kg (153 lb 7 oz)    Weight change: 1 kg (2 lb 3.3 oz)  HEENT: [place]/AT, Eyes-Brown, PERL, EOMI, Conjunctiva-Pink, Sclera-Non-icteric Neck: + JVD, No bruit, Trachea midline. Lungs:  Clear, Bilateral. Cardiac:  Regular rhythm, normal S1 and S2, no S3. II/VI systolic murmur. Abdomen:  Soft, non-tender. Extremities:  No edema present. No cyanosis. + clubbing. CNS: AxOx3, Cranial nerves grossly intact, moves all 4 extremities. Right handed. Skin: Warm and dry.   Intake/Output from previous day: 12/04 0701 - 12/05 0700 In: 550 [P.O.:550] Out: 2325 [Urine:2325]    Lab Results: BMET    Component Value Date/Time   NA 137 01/21/2015 0217   NA 138 01/20/2015 0300   NA 139 01/19/2015 0332   K 3.8 01/21/2015 0217   K 4.6 01/20/2015 0300   K 4.0 01/19/2015 0332   CL 105 01/21/2015 0217   CL 104 01/20/2015 0300   CL 106 01/19/2015 0332   CO2 22 01/21/2015 0217   CO2 21* 01/20/2015 0300   CO2 24 01/19/2015 0332   GLUCOSE 106* 01/21/2015 0217   GLUCOSE 122* 01/20/2015 0300   GLUCOSE 134* 01/19/2015 0332   BUN 52* 01/21/2015 0217   BUN 47* 01/20/2015 0300   BUN 34* 01/19/2015 0332   CREATININE 2.31* 01/21/2015 0217   CREATININE 2.68* 01/20/2015 0300   CREATININE 1.99* 01/19/2015 0332   CALCIUM 9.0  01/21/2015 0217   CALCIUM 9.5 01/20/2015 0300   CALCIUM 9.6 01/19/2015 0332   GFRNONAA 28* 01/21/2015 0217   GFRNONAA 24* 01/20/2015 0300   GFRNONAA 34* 01/19/2015 0332   GFRAA 33* 01/21/2015 0217   GFRAA 27* 01/20/2015 0300   GFRAA 39* 01/19/2015 0332   CBC    Component Value Date/Time   WBC 5.4 01/19/2015 0843   RBC 4.09* 01/19/2015 0843   HGB 13.5 01/19/2015 0843   HCT 41.0 01/19/2015 0843   PLT 225 01/19/2015 0843   MCV 100.2* 01/19/2015 0843   MCH 33.0 01/19/2015 0843   MCHC 32.9 01/19/2015 0843   RDW 13.1 01/19/2015 0843   LYMPHSABS 1.5 01/19/2015 0843   MONOABS 0.4 01/19/2015 0843   EOSABS 0.1 01/19/2015 0843   BASOSABS 0.1 01/19/2015 0843   HEPATIC Function Panel  Recent Labs  01/19/15 0403  PROT 6.2*   HEMOGLOBIN A1C No components found for: HGA1C,  MPG CARDIAC ENZYMES Lab Results  Component Value Date   CKTOTAL 65 09/08/2011   CKMB 3.5 09/08/2011   TROPONINI 0.06* 01/19/2015   TROPONINI 0.06* 01/19/2015   TROPONINI 0.05* 01/19/2015   BNP No results for input(s): PROBNP in the last 8760 hours. TSH No results for input(s): TSH in the last 8760 hours. CHOLESTEROL No results for input(s): CHOL in the last 8760 hours.  Scheduled Meds: .  acetaminophen  1,000 mg Oral Once  . allopurinol  100 mg Oral Daily  . amiodarone  400 mg Oral BID  . carvedilol  12.5 mg Oral BID  . edoxaban  30 mg Oral Daily  . famotidine  20 mg Oral BID  . furosemide  40 mg Intravenous Q12H  . lisinopril  10 mg Oral Daily  . LORazepam  0.5 mg Oral BID  . meclizine  12.5 mg Oral BID  . multivitamin with minerals  1 tablet Oral Daily  . potassium chloride  20 mEq Oral Once  . rosuvastatin  10 mg Oral Q M,W,F  . sodium chloride  3 mL Intravenous Q12H  . spironolactone  12.5 mg Oral Daily   Continuous Infusions:  PRN Meds:.sodium chloride, acetaminophen, ondansetron (ZOFRAN) IV, oxyCODONE, sodium chloride  Assessment/Plan: Acute on chronic left heart systolic  failure Paroxysmal atrial flutter CHA2DS2VASC of 3/9 Dilated cardiomyopathy.  S/P SVT ablation x 2  S/P ICD placement Hiatal hernia with reflux esophagitis Prostate cancer CKD, II Gout Generalized anxiety disorder Insomnia  Add amiodarone. Increase activity. Home soon.     LOS: 2 days    Dixie Dials  MD  01/21/2015, 7:02 PM

## 2015-01-22 LAB — COMPREHENSIVE METABOLIC PANEL
ALT: 63 U/L (ref 17–63)
ANION GAP: 9 (ref 5–15)
AST: 62 U/L — ABNORMAL HIGH (ref 15–41)
Albumin: 3 g/dL — ABNORMAL LOW (ref 3.5–5.0)
Alkaline Phosphatase: 76 U/L (ref 38–126)
BILIRUBIN TOTAL: 1.7 mg/dL — AB (ref 0.3–1.2)
BUN: 40 mg/dL — ABNORMAL HIGH (ref 6–20)
CO2: 24 mmol/L (ref 22–32)
Calcium: 9 mg/dL (ref 8.9–10.3)
Chloride: 105 mmol/L (ref 101–111)
Creatinine, Ser: 1.86 mg/dL — ABNORMAL HIGH (ref 0.61–1.24)
GFR, EST AFRICAN AMERICAN: 42 mL/min — AB (ref >60)
GFR, EST NON AFRICAN AMERICAN: 37 mL/min — AB (ref >60)
Glucose, Bld: 151 mg/dL — ABNORMAL HIGH (ref 65–99)
POTASSIUM: 3.9 mmol/L (ref 3.5–5.1)
Sodium: 138 mmol/L (ref 135–145)
TOTAL PROTEIN: 5.7 g/dL — AB (ref 6.5–8.1)

## 2015-01-22 MED ORDER — FUROSEMIDE 40 MG PO TABS
40.0000 mg | ORAL_TABLET | Freq: Two times a day (BID) | ORAL | Status: DC
  Administered 2015-01-22 – 2015-01-23 (×2): 40 mg via ORAL
  Filled 2015-01-22 (×2): qty 1

## 2015-01-22 MED ORDER — POTASSIUM CHLORIDE ER 10 MEQ PO TBCR
20.0000 meq | EXTENDED_RELEASE_TABLET | Freq: Once | ORAL | Status: AC
  Administered 2015-01-22: 20 meq via ORAL
  Filled 2015-01-22 (×2): qty 2

## 2015-01-22 NOTE — Progress Notes (Signed)
Pt had another episode of SVT, HR in the 200's. Pt was found ambulating in room, only c/o mild dizziness. BP 111/97. No intervention needed as pt came out of it on his own. Coreg and Amiodarone were given early and [name] paged. No new orders at this time. Will continue to monitor.

## 2015-01-22 NOTE — Progress Notes (Signed)
Ambulated pt in hallway, walked approximately 1440ft. Tolerated walk very well, HR 100-115, O2 89-100% on RA. Denied SOB or pain.

## 2015-01-22 NOTE — Care Management Important Message (Signed)
Important Message  Patient Details  Name: [name] MRN: [id number] Date of Birth: 07/22/1950   Medicare Important Message Given:  Yes    Lauro Manlove P Alazia Crocket 01/22/2015, 3:53 PM

## 2015-01-22 NOTE — Progress Notes (Signed)
Ref: [name]   Subjective:  Awake. Had one more episode of SVT terminated in 1.5 minutes without treatment.  Objective:  Vital Signs in the last 24 hours: Temp:  [97.3 F (36.3 C)-97.8 F (36.6 C)] 97.6 F (36.4 C) (12/06 0759) Pulse Rate:  [68-112] 68 (12/06 0913) Cardiac Rhythm:  [-] Sinus tachycardia (12/06 0913) Resp:  [18-27] 26 (12/06 0913) BP: (104-112)/(66-97) 111/97 mmHg (12/06 0913) SpO2:  [95 %-99 %] 96 % (12/06 0913) Weight:  [68.1 kg (150 lb 2.1 oz)] 68.1 kg (150 lb 2.1 oz) (12/06 0500)  Physical Exam: BP Readings from Last 1 Encounters:  01/22/15 111/97    Wt Readings from Last 1 Encounters:  01/22/15 68.1 kg (150 lb 2.1 oz)    Weight change: -1.5 kg (-3 lb 4.9 oz)  HEENT: [place]/AT, Eyes-Brown, PERL, EOMI, Conjunctiva-Pink, Sclera-Non-icteric Neck: + JVD, No bruit, Trachea midline. Lungs:  Clear, Bilateral. Cardiac:  Regular rhythm, normal S1 and S2, no S3. II/VI systolic murmur. Abdomen:  Soft, non-tender. Extremities:  No edema present. No cyanosis. + clubbing. CNS: AxOx3, Cranial nerves grossly intact, moves all 4 extremities. Right handed. Skin: Warm and dry.   Intake/Output from previous day: 12/05 0701 - 12/06 0700 In: 800 [P.O.:800] Out: 3125 [Urine:3125]    Lab Results: BMET    Component Value Date/Time   NA 138 01/22/2015 0210   NA 137 01/21/2015 0217   NA 138 01/20/2015 0300   K 3.9 01/22/2015 0210   K 3.8 01/21/2015 0217   K 4.6 01/20/2015 0300   CL 105 01/22/2015 0210   CL 105 01/21/2015 0217   CL 104 01/20/2015 0300   CO2 24 01/22/2015 0210   CO2 22 01/21/2015 0217   CO2 21* 01/20/2015 0300   GLUCOSE 151* 01/22/2015 0210   GLUCOSE 106* 01/21/2015 0217   GLUCOSE 122* 01/20/2015 0300   BUN 40* 01/22/2015 0210   BUN 52* 01/21/2015 0217   BUN 47* 01/20/2015 0300   CREATININE 1.86* 01/22/2015 0210   CREATININE 2.31* 01/21/2015 0217   CREATININE 2.68* 01/20/2015 0300   CALCIUM 9.0 01/22/2015 0210   CALCIUM 9.0  01/21/2015 0217   CALCIUM 9.5 01/20/2015 0300   GFRNONAA 37* 01/22/2015 0210   GFRNONAA 28* 01/21/2015 0217   GFRNONAA 24* 01/20/2015 0300   GFRAA 42* 01/22/2015 0210   GFRAA 33* 01/21/2015 0217   GFRAA 27* 01/20/2015 0300   CBC    Component Value Date/Time   WBC 5.4 01/19/2015 0843   RBC 4.09* 01/19/2015 0843   HGB 13.5 01/19/2015 0843   HCT 41.0 01/19/2015 0843   PLT 225 01/19/2015 0843   MCV 100.2* 01/19/2015 0843   MCH 33.0 01/19/2015 0843   MCHC 32.9 01/19/2015 0843   RDW 13.1 01/19/2015 0843   LYMPHSABS 1.5 01/19/2015 0843   MONOABS 0.4 01/19/2015 0843   EOSABS 0.1 01/19/2015 0843   BASOSABS 0.1 01/19/2015 0843   HEPATIC Function Panel  Recent Labs  01/19/15 0403 01/22/15 0210  PROT 6.2* 5.7*   HEMOGLOBIN A1C No components found for: HGA1C,  MPG CARDIAC ENZYMES Lab Results  Component Value Date   CKTOTAL 65 09/08/2011   CKMB 3.5 09/08/2011   TROPONINI 0.06* 01/19/2015   TROPONINI 0.06* 01/19/2015   TROPONINI 0.05* 01/19/2015   BNP No results for input(s): PROBNP in the last 8760 hours. TSH No results for input(s): TSH in the last 8760 hours. CHOLESTEROL No results for input(s): CHOL in the last 8760 hours.  Scheduled Meds: . acetaminophen  1,000  mg Oral Once  . allopurinol  100 mg Oral Daily  . amiodarone  400 mg Oral BID  . carvedilol  12.5 mg Oral BID  . edoxaban  30 mg Oral Daily  . famotidine  20 mg Oral BID  . furosemide  40 mg Oral BID  . lisinopril  10 mg Oral Daily  . LORazepam  0.5 mg Oral BID  . meclizine  12.5 mg Oral BID  . multivitamin with minerals  1 tablet Oral Daily  . potassium chloride  20 mEq Oral Once  . rosuvastatin  10 mg Oral Q M,W,F  . sodium chloride  3 mL Intravenous Q12H  . spironolactone  12.5 mg Oral Daily   Continuous Infusions:  PRN Meds:.sodium chloride, acetaminophen, ondansetron (ZOFRAN) IV, oxyCODONE, sodium chloride  Assessment/Plan: Acute on chronic left heart systolic failure Paroxysmal atrial  flutter CHA2DS2VASC of 3/9 Dilated cardiomyopathy.  S/P SVT ablation x 2  S/P ICD placement Hiatal hernia with reflux esophagitis Prostate cancer CKD, II Gout Generalized anxiety disorder Insomnia  Continue medical treatment. Change lasix IV to PO.    LOS: 3 days    Dixie Dials  MD  01/22/2015, 11:49 AM

## 2015-01-23 ENCOUNTER — Encounter: Payer: Self-pay | Admitting: Radiation Oncology

## 2015-01-23 LAB — BRAIN NATRIURETIC PEPTIDE: B NATRIURETIC PEPTIDE 5: 511.1 pg/mL — AB (ref 0.0–100.0)

## 2015-01-23 MED ORDER — AMIODARONE HCL 200 MG PO TABS: 200.0000 mg | ORAL_TABLET | Freq: Every day | ORAL | Status: AC

## 2015-01-23 MED ORDER — CARVEDILOL 12.5 MG PO TABS: 12.5000 mg | ORAL_TABLET | Freq: Two times a day (BID) | ORAL | Status: AC

## 2015-01-23 MED ORDER — LORAZEPAM 0.5 MG PO TABS: 0.5000 mg | ORAL_TABLET | Freq: Two times a day (BID) | ORAL | Status: DC

## 2015-01-23 NOTE — Discharge Summary (Signed)
Physician Discharge Summary  Patient ID: [name] MRN: [id number] DOB/AGE: Jan 03, 1951 64 y.o.  Admit date: 01/19/2015 Discharge date: 01/23/2015  Admission Diagnoses: Acute on chronic left heart systolic failure Paroxysmal atrial flutter CHA2DS2VASC of 3/9 Dilated cardiomyopathy.  S/P SVT ablation x 2  S/P ICD placement Hiatal hernia with reflux esophagitis Prostate cancer CKD, II Gout  Discharge Diagnoses:  Principle Problem: * Acute on chronic left heart systolic failure * Paroxysmal atrial flutter CHA2DS2VASC of 3/9 Dilated cardiomyopathy.  S/P SVT ablation x 2  S/P ICD placement Hiatal hernia with reflux esophagitis Prostate cancer CKD, II Gout Generalized anxiety disorder Insomnia  Discharged Condition: stable  Hospital Course: 64 year old male with hypertension, acid reflex, dilated cardiomyopathy, chronic systolic heart failure, recently diagnosed prostate cancer, paroxysmal atrial fibrillation with CHA2DSVA2Sc score of 3/9 on Edoxaban, CKD, II, S/P ICD placement has 1 week history of shortness of breath with exertion. BNP elevated at 1387.7. Post diuresis BNP came down to 511.1. He also had acute anxiety disorder responding well to lorazepam. He was started on amiodarone and coreg was increased for heart rate control as he did not tolerate metoprolol (had sweating spell x 2 with IV metoprolol use). He will see me in one week and primary docotr in 1-2 weeks.  Consults: cardiology  Significant Diagnostic Studies: labs: Normal CBC except borderline MCV. Normal electrolytes with mildly elevated glucose and BUN/Cr of 47/2.68  Chest X-ray: Cardiomegaly. Mild chronic appearing central vascular prominence. The lungs are clear except for scattered tiny benign granulomata  EKG-atrial flutter with 2: 1 AV conduction. Episodes of SVT on Monitor.  Treatments: cardiac meds: lisinopril (generic), carvedilol, digoxin, furosemide, amiodarone, rosuvastatin, Edoxaban and  spironolactone.  Discharge Exam: Blood pressure 103/77, pulse 101, temperature 97.9 F (36.6 C), temperature source Oral, resp. rate 18, height 5\' 7"  (1.702 m), weight 68.5 kg (151 lb 0.2 oz), SpO2 95 %. HEENT: [place]/AT, Eyes-Brown, PERL, EOMI, Conjunctiva-Pink, Sclera-Non-icteric Neck: + JVD, No bruit, Trachea midline. Lungs: Clear, Bilateral. Cardiac: Regular rhythm, normal S1 and S2, no S3. II/VI systolic murmur. Abdomen: Soft, non-tender. Extremities: No edema present. No cyanosis. + clubbing. CNS: AxOx3, Cranial nerves grossly intact, moves all 4 extremities. Right handed. Skin: Warm and dry.  Disposition: 01-Home or Self Care     Medication List    TAKE these medications        allopurinol 100 MG tablet  Commonly known as:  ZYLOPRIM  Take 100 mg by mouth daily.     amiodarone 200 MG tablet  Commonly known as:  PACERONE  Take 1 tablet (200 mg total) by mouth daily.     carvedilol 12.5 MG tablet  Commonly known as:  COREG  Take 1 tablet (12.5 mg total) by mouth 2 (two) times daily.     furosemide 40 MG tablet  Commonly known as:  LASIX  Take 40 mg by mouth daily.     lisinopril 20 MG tablet  Commonly known as:  PRINIVIL,ZESTRIL  Take 0.5 tablets (10 mg total) by mouth daily.     LORazepam 0.5 MG tablet  Commonly known as:  ATIVAN  Take 1 tablet (0.5 mg total) by mouth 2 (two) times daily.     meclizine 12.5 MG tablet  Commonly known as:  ANTIVERT  Take 12.5 mg by mouth 2 (two) times daily.     multivitamin with minerals Tabs tablet  Take 0.5 tablets by mouth 2 (two) times daily.     oxyCODONE 5 MG immediate release tablet  Commonly  known as:  Oxy IR/ROXICODONE  Take 5 mg by mouth 2 (two) times daily as needed for moderate pain or severe pain.     ranitidine 150 MG tablet  Commonly known as:  ZANTAC  Take 150 mg by mouth 2 (two) times daily.     rosuvastatin 20 MG tablet  Commonly known as:  CRESTOR  Take 10 mg by mouth every Monday, Wednesday, and  Friday.     SAVAYSA 30 MG Tabs tablet  Generic drug:  edoxaban  Take 30 mg by mouth daily.     spironolactone 25 MG tablet  Commonly known as:  ALDACTONE  Take 12.5 mg by mouth daily.           Follow-up Information    Follow up with [facility] S, MD. Schedule an appointment as soon as possible for a visit in 1 week.   Specialty:  Cardiology   Contact information:   [place] [address] [phone]       Signed: Birdie Riddle 01/23/2015, 9:40 AM

## 2015-01-23 NOTE — Progress Notes (Signed)
CC: Dr. Nicolette Bang, Dr. [name]  Chart note: [name] is now being discharged after a 4 day hospitalization for acute and chronic left heart systolic failure.  He presented with a one-week history of shortness of breath with exertion.  Prior to this hospitalization, Dr. Crissie Sickles felt that he may live 5 years, but unlikely to live 10 years.  At this point in time, I am more concerned about his longevity, and  I think we should wait approximately one-month before considering placement of 3 gold seeds for image guidance and radiation therapy planning.  I will have Dr. Tammi Klippel see him for a follow-up visit in one month.  We may need to have another discussion with his cardiologist.  He saw [name] during this hospitalization.  He will see him back for a follow-up visit in one week his primary care physician in 1-2 weeks.  Of note is that he is best contacted to his cell phone at [phone] or [phone].

## 2015-02-12 ENCOUNTER — Telehealth: Payer: Self-pay | Admitting: Cardiology

## 2015-02-12 ENCOUNTER — Encounter: Payer: Medicare HMO | Admitting: *Deleted

## 2015-02-12 NOTE — Telephone Encounter (Signed)
Attempted to confirm remote transmission with pt. No answer and was unable to leave a message.   

## 2015-02-13 ENCOUNTER — Encounter: Payer: Self-pay | Admitting: Cardiology

## 2015-02-22 ENCOUNTER — Encounter: Payer: Self-pay | Admitting: Radiation Oncology

## 2015-02-22 NOTE — Progress Notes (Signed)
  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 Lowella Bandy on 09/07/14 with a PSA of 3.4, but was not compliant with FU exams. By 12/13/14 his PSA hd risen to 8.0 with a Free PSA of 21% and a biopsy was performed on this day.  Past/Anticipated interventions by urology, if any: Biopsy of Prostate  Past/Anticipated interventions by medical oncology, if any: Unknown  Weight changes, if any: No  Bowel/Bladder complaints, if any: Urinary Frequency, Nocturia x 3, intermittent burning when voiding since biopsy  Nausea/Vomiting, if any: None  Pain issues, if any: No  SAFETY ISSUES:  Prior radiation?No  Pacemaker/ICD? Yes, [employer] Jude  Possible current pregnancy? No  Is the patient on methotrexate? No  Current Complaints / other details:65 year old male. Married, 2 children. Disabled. Gold seed placement? May not have occurred due to hospitalization for chronic let heart systolic failure.

## 2015-02-25 ENCOUNTER — Encounter: Payer: Self-pay | Admitting: Radiation Oncology

## 2015-02-25 ENCOUNTER — Ambulatory Visit (INDEPENDENT_AMBULATORY_CARE_PROVIDER_SITE_OTHER): Payer: Medicare HMO | Admitting: *Deleted

## 2015-02-25 ENCOUNTER — Telehealth: Payer: Self-pay | Admitting: Radiation Oncology

## 2015-02-25 ENCOUNTER — Ambulatory Visit
Admission: RE | Admit: 2015-02-25 | Discharge: 2015-02-25 | Disposition: A | Discharge status: Home / Self Care | Payer: Medicare HMO | Source: Ambulatory Visit | Attending: Radiation Oncology | Admitting: Radiation Oncology

## 2015-02-25 ENCOUNTER — Telehealth: Payer: Self-pay | Admitting: *Deleted

## 2015-02-25 VITALS — BP 130/84 | HR 64 | Temp 98.3°F | Resp 12 | Wt 157.4 lb

## 2015-02-25 DIAGNOSIS — Z801 Family history of malignant neoplasm of trachea, bronchus and lung: Secondary | ICD-10-CM | POA: Diagnosis not present

## 2015-02-25 DIAGNOSIS — Z7289 Other problems related to lifestyle: Secondary | ICD-10-CM | POA: Diagnosis not present

## 2015-02-25 DIAGNOSIS — I129 Hypertensive chronic kidney disease with stage 1 through stage 4 chronic kidney disease, or unspecified chronic kidney disease: Secondary | ICD-10-CM | POA: Diagnosis not present

## 2015-02-25 DIAGNOSIS — J449 Chronic obstructive pulmonary disease, unspecified: Secondary | ICD-10-CM | POA: Diagnosis not present

## 2015-02-25 DIAGNOSIS — I4891 Unspecified atrial fibrillation: Secondary | ICD-10-CM | POA: Diagnosis not present

## 2015-02-25 DIAGNOSIS — Z950 Presence of cardiac pacemaker: Secondary | ICD-10-CM | POA: Diagnosis not present

## 2015-02-25 DIAGNOSIS — Z8042 Family history of malignant neoplasm of prostate: Secondary | ICD-10-CM | POA: Diagnosis not present

## 2015-02-25 DIAGNOSIS — Z800 Family history of malignant neoplasm of digestive organs: Secondary | ICD-10-CM | POA: Diagnosis not present

## 2015-02-25 DIAGNOSIS — Z833 Family history of diabetes mellitus: Secondary | ICD-10-CM | POA: Diagnosis not present

## 2015-02-25 DIAGNOSIS — I509 Heart failure, unspecified: Secondary | ICD-10-CM | POA: Diagnosis not present

## 2015-02-25 DIAGNOSIS — C61 Malignant neoplasm of prostate: Secondary | ICD-10-CM | POA: Diagnosis present

## 2015-02-25 DIAGNOSIS — I459 Conduction disorder, unspecified: Secondary | ICD-10-CM

## 2015-02-25 DIAGNOSIS — N182 Chronic kidney disease, stage 2 (mild): Secondary | ICD-10-CM | POA: Diagnosis not present

## 2015-02-25 DIAGNOSIS — I471 Supraventricular tachycardia: Secondary | ICD-10-CM | POA: Diagnosis not present

## 2015-02-25 DIAGNOSIS — Z823 Family history of stroke: Secondary | ICD-10-CM | POA: Diagnosis not present

## 2015-02-25 DIAGNOSIS — Z87891 Personal history of nicotine dependence: Secondary | ICD-10-CM | POA: Diagnosis not present

## 2015-02-25 DIAGNOSIS — F1210 Cannabis abuse, uncomplicated: Secondary | ICD-10-CM | POA: Diagnosis not present

## 2015-02-25 DIAGNOSIS — F1410 Cocaine abuse, uncomplicated: Secondary | ICD-10-CM | POA: Diagnosis not present

## 2015-02-25 DIAGNOSIS — Z510 Encounter for antineoplastic radiation therapy: Secondary | ICD-10-CM | POA: Diagnosis present

## 2015-02-25 NOTE — Progress Notes (Signed)
  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 Lowella Bandy on 09/07/14 with a PSA of 3.4, but was not compliant with FU exams. By 12/13/14 his PSA hd risen to 8.0 with a Free PSA of 21% and a biopsy was performed on this day.  Past/Anticipated interventions by urology, if any: Biopsy of Prostate  Past/Anticipated interventions by medical oncology, if any: Unknown  Weight changes, if any: No  Bowel/Bladder complaints, if any: Urinary Frequency, Nocturia x 3, intermittent burning when voiding since biopsy  Nausea/Vomiting, if any: None  Pain issues, if any: No  SAFETY ISSUES:  Prior radiation?No  Pacemaker/ICD? Yes, [employer] Jude  Possible current pregnancy? No  Is the patient on methotrexate? No  Current Complaints / other details:65 year old male. Married, 2 children. Disabled. Gold seed placement? May not have occurred due to hospitalization for chronic let heart systolic failure.

## 2015-02-25 NOTE — Progress Notes (Signed)
See progress note under physician encounter. 

## 2015-02-25 NOTE — Progress Notes (Signed)
Radiation Oncology         (336) [phone] ________________________________  Established Outpatient Follow Up  Name: [name] MRN: [id number]  Date: 02/25/2015  DOB: 1950-12-01  [id number] NOT IN SYSTEM  McKenzie, Candee Furbish, MD   REFERRING PHYSICIAN: [name], MD  DIAGNOSIS: 65 y.o. gentleman with stage T1c adenocarcinoma of the prostate with a Gleason's score of 3+4 and a PSA of 8.08    ICD-9-CM ICD-10-CM   1. Malignant neoplasm of prostate ([place]) [place] OF PRESENT ILLNESS::[name] is a 65 y.o. gentleman.  He was noted to have an elevated PSA of 8.08. Accordingly, he was referred for evaluation in urology by Dr. Janice Norrie and subsequently Dr. Alyson Ingles. On 09/07/14, he had a digital rectal examination performed at that time revealing no nodules.  The patient proceeded to transrectal ultrasound with 12 biopsies of the prostate on 12/13/14.  The prostate volume measured 41.5 cc.  Out of 12 core biopsies, 5 were positive.  The maximum Gleason score was 3+4, and this was seen in the left base. The patient was seen by Dr. Valere Dross in late November and a decision regarding treatment was delayed due to cardiac events.  The patient reviewed the biopsy results with his urologist and he has kindly been referred today for discussion of potential radiation treatment options.  PREVIOUS RADIATION THERAPY: No  PAST MEDICAL HISTORY:  has a past medical history of Hypertension; GERD (gastroesophageal reflux disease); Hiatal hernia; CHF (congestive heart failure) ([place]); Cardiomyopathy ([place]); COPD (chronic obstructive pulmonary disease) ([place]); Chronic kidney disease (CKD), stage II (mild); Atrial fibrillation ([place]); SVT (supraventricular tachycardia) ([place]); Heart murmur; Pneumonia; Shortness of breath; Headache(784.0); Arthritis; Gout; Depression; Presence of permanent cardiac pacemaker; and Prostate cancer ([place]) (12/13/14).    PAST SURGICAL HISTORY: Past Surgical History  Procedure  Laterality Date  . Tee without cardioversion N/A 05/11/2012    Procedure: TRANSESOPHAGEAL ECHOCARDIOGRAM (TEE);  Surgeon: Birdie Riddle, MD;  Location[facility] ENDOSCOPY;  Service: Cardiovascular;  Laterality: N/A;  . Cardioversion N/A 05/11/2012    Procedure: Electrocardioversion, external;  Surgeon: Birdie Riddle, MD;  Location: [place];  Service: Cardiovascular;  Laterality: N/A;  . Cardioversion N/A 05/11/2012    Procedure: CARDIOVERSION AT THE BEDSIDE;  Surgeon: Birdie Riddle, MD;  Location: [place];  Service: Cardiovascular;  Laterality: N/A;  . Supraventricular tachycardia ablation  08/2011; 09/2011  . Cardiac catheterization  08/2011  . Left heart catheterization with coronary angiogram N/A 02/02/2011    Procedure: LEFT HEART CATHETERIZATION WITH CORONARY ANGIOGRAM;  Surgeon: Birdie Riddle, MD;  Location: [place] CATH LAB;  Service: Cardiovascular;  Laterality: N/A;  . Right heart catheterization N/A 09/10/2011    Procedure: RIGHT HEART CATH;  Surgeon: Jolaine Artist, MD;  Location: [facility] CATH LAB;  Service: Cardiovascular;  Laterality: N/A;  . Supraventricular tachycardia ablation N/A 09/14/2011    Procedure: SUPRAVENTRICULAR TACHYCARDIA ABLATION;  Surgeon: Evans Lance, MD;  Location[facility] CATH LAB;  Service: Cardiovascular;  Laterality: N/A;  . Supraventricular tachycardia ablation N/A 09/21/2011    Procedure: SUPRAVENTRICULAR TACHYCARDIA ABLATION;  Surgeon: Evans Lance, MD;  Location: [facility] CATH LAB;  Service: Cardiovascular;  Laterality: N/A;  . Temporary pacemaker insertion N/A 09/30/2013    Procedure: TEMPORARY PACEMAKER INSERTION;  Surgeon: Birdie Riddle, MD;  Location: [place] CATH LAB;  Service: Cardiovascular;  Laterality: N/A;  . Left heart catheterization with coronary angiogram  09/30/2013    Procedure: LEFT HEART CATHETERIZATION WITH CORONARY ANGIOGRAM;  Surgeon: Birdie Riddle, MD;  Location: [facility] CATH LAB;  Service: Cardiovascular;;  . Bi-ventricular pacemaker insertion N/A 10/05/2013     Procedure: BI-VENTRICULAR PACEMAKER INSERTION (CRT-P);  Surgeon: Evans Lance, MD;  Location[facility] CATH LAB;  Service: Cardiovascular;  Laterality: N/A;  . Colonoscopy with propofol N/A 03/15/2014    Procedure: COLONOSCOPY WITH PROPOFOL;  Surgeon: Milus Banister, MD;  Location: [facility] ENDOSCOPY;  Service: Endoscopy;  Laterality: N/A;    FAMILY HISTORY: family history includes Alcohol abuse in his father; Colon cancer in his maternal aunt; Colon polyps in his cousin; Diabetes in his sister; Heart failure in his mother; Liver cancer in his brother; Lung cancer in his mother; Other in his maternal aunt; Pneumonia in his father; Prostate cancer in his paternal grandfather; Seizures in his brother; Stroke in his sister.  SOCIAL HISTORY:  reports that he quit smoking about 3 years ago. His smoking use included Cigarettes. He has a 3.75 pack-year smoking history. He has never used smokeless tobacco. He reports that he drinks about 3.6 oz of alcohol per week. He reports that he uses illicit drugs (Cocaine and Marijuana).  ALLERGIES: Ibuprofen  MEDICATIONS:  Current Outpatient Prescriptions  Medication Sig Dispense Refill  . allopurinol (ZYLOPRIM) 100 MG tablet Take 100 mg by mouth daily.     Marland Kitchen amiodarone (PACERONE) 200 MG tablet Take 1 tablet (200 mg total) by mouth daily. 30 tablet 3  . carvedilol (COREG) 12.5 MG tablet Take 1 tablet (12.5 mg total) by mouth 2 (two) times daily. 60 tablet 3  . Edoxaban Tosylate (SAVAYSA) 30 MG TABS Take 30 mg by mouth daily.     . furosemide (LASIX) 40 MG tablet Take 40 mg by mouth daily.     Marland Kitchen lisinopril (PRINIVIL,ZESTRIL) 20 MG tablet Take 0.5 tablets (10 mg total) by mouth daily.    Marland Kitchen LORazepam (ATIVAN) 0.5 MG tablet Take 1 tablet (0.5 mg total) by mouth 2 (two) times daily. 30 tablet 0  . meclizine (ANTIVERT) 12.5 MG tablet Take 12.5 mg by mouth 2 (two) times daily.  3  . Multiple Vitamin (MULTIVITAMIN WITH MINERALS) TABS tablet Take 0.5 tablets by mouth 2 (two) times  daily.     Marland Kitchen oxyCODONE (OXY IR/ROXICODONE) 5 MG immediate release tablet Take 5 mg by mouth 2 (two) times daily as needed for moderate pain or severe pain.    . ranitidine (ZANTAC) 150 MG tablet Take 150 mg by mouth 2 (two) times daily.  3  . rosuvastatin (CRESTOR) 20 MG tablet Take 10 mg by mouth every Monday, Wednesday, and Friday.    Marland Kitchen spironolactone (ALDACTONE) 25 MG tablet Take 12.5 mg by mouth daily.     No current facility-administered medications for this encounter.    REVIEW OF SYSTEMS:  A 15 point review of systems is documented in the electronic medical record. This was obtained by the nursing staff. However, I reviewed this with the patient to discuss relevant findings and make appropriate changes.  Pertinent items are noted in HPI..  The patient completed an IPSS and IIEF questionnaire.  His IPSS score was 4 indicating mild urinary outflow obstructive symptoms.  He indicated that his erectile function is not a factor.   PHYSICAL EXAM: This patient is in no acute distress.  He is alert and oriented.   weight is 157 lb 6.4 oz (71.396 kg). His oral temperature is 98.3 F (36.8 C). His blood pressure is 130/84 and his pulse is 64. His respiration is 12 and  oxygen saturation is 99%.  He exhibits no respiratory distress or labored breathing.  He appears neurologically intact.  His mood is pleasant.  His affect is appropriate.  Please note the digital rectal exam findings described above.  KPS = 100  100 - Normal; no complaints; no evidence of disease. 90   - Able to carry on normal activity; minor signs or symptoms of disease. 80   - Normal activity with effort; some signs or symptoms of disease. 79   - Cares for self; unable to carry on normal activity or to do active work. 60   - Requires occasional assistance, but is able to care for most of his personal needs. 50   - Requires considerable assistance and frequent medical care. 49   - Disabled; requires special care and assistance. 13    - Severely disabled; hospital admission is indicated although death not imminent. 50   - Very sick; hospital admission necessary; active supportive treatment necessary. 10   - Moribund; fatal processes progressing rapidly. 0     - Dead  Karnofsky DA, Abelmann McIntire, Craver LS and Burchenal [facility] [phone]) The use of the nitrogen mustards in the palliative treatment of carcinoma: with particular reference to bronchogenic carcinoma Cancer 1 634-56   LABORATORY DATA:  Lab Results  Component Value Date   WBC 5.4 01/19/2015   HGB 13.5 01/19/2015   HCT 41.0 01/19/2015   MCV 100.2* 01/19/2015   PLT 225 01/19/2015   Lab Results  Component Value Date   NA 138 01/22/2015   K 3.9 01/22/2015   CL 105 01/22/2015   CO2 24 01/22/2015   Lab Results  Component Value Date   ALT 63 01/22/2015   AST 62* 01/22/2015   ALKPHOS 76 01/22/2015   BILITOT 1.7* 01/22/2015     RADIOGRAPHY: No results found.    IMPRESSION: This is 65 y.o. gentleman with stage T1c adenocarcinoma of the prostate with a Gleason's score of 3+4 and a PSA of 8.08.  His T-Stage, Gleason's Score, and PSA put him into the intermediate risk group.  Accordingly he is eligible external radiation due to medical comorbidities.  PLAN: Today I reviewed the findings and workup thus far.  We discussed the natural history of prostate cancer.  We reviewed the the implications of T-stage, Gleason's Score, and PSA on decision-making and outcomes in prostate cancer.  We discussed radiation treatment in the management of prostate cancer with regard to the logistics and delivery of external beam radiation treatment as well as the logistics and delivery of prostate brachytherapy.  We compared and contrasted each of these approaches and also compared these against prostatectomy.  The patient expressed interest in external beam radiotherapy.  I filled out a patient counseling form for him with relevant treatment diagrams and we retained a copy for our records.     The patient would like to proceed with prostate IMRT.  I will share my findings with Dr. Alyson Ingles and move forward with scheduling placement of three gold fiducial markers into the prostate to proceed with IMRT in the near future.     I enjoyed meeting with him today, and will look forward to participating in the care of this very nice gentleman.  I spent 60 minutes face to face with the patient and more than 50% of that time was spent in counseling and/or coordination of care.   ------------------------------------------------  Sheral Apley. Tammi Klippel, M.D.  This document serves as a record of services personally performed by Rodman Key  Tammi Klippel, MD. It was created on his behalf by Darcus Austin, a trained medical scribe. The creation of this record is based on the scribe's personal observations and the provider's statements to them. This document has been checked and approved by the attending provider.

## 2015-02-25 NOTE — Telephone Encounter (Signed)
Phoned patient to inquire if he plans to present for consult with Dr. Tammi Klippel today. No answer at home or on wife's cell. Left message requesting return call.

## 2015-02-25 NOTE — Telephone Encounter (Signed)
Called patient to inform of gold seed placement on 03-19-15- arrival time - 7:30 am @ Dr. Noland Fordyce Office and his sim on 03-22-15 @ 2 pm @ Dr. Johny Shears Office, no answer will call later.

## 2015-02-25 NOTE — Progress Notes (Signed)
  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 Lowella Bandy on 09/07/14 with a PSA of 3.4, but was not compliant with FU exams. By 12/13/14 his PSA hd risen to 8.0 with a Free PSA of 21% and a biopsy was performed on this day.  Past/Anticipated interventions by urology, if any: Biopsy of Prostate  Past/Anticipated interventions by medical oncology, if any: Unknown  Weight changes, if any: No  Bowel/Bladder complaints, if any: Urinary Frequency, Nocturia x 2- 3, intermittent burning when voiding since biopsy  Nausea/Vomiting, if any: None  Pain issues, if any: yes 2/10 hiatal hernia, intermittent- aching abdominal pain  SAFETY ISSUES:  Prior radiation?No  Pacemaker/ICD? Yes, St. Jude  Possible current pregnancy? No  Is the patient on methotrexate? No  Current Complaints / other details:65 year old male. Married, 2 children. Disabled. Pt reports the cold weather makes it difficult for him to breath, "hard on my heart."

## 2015-02-26 NOTE — Progress Notes (Signed)
Remote pacemaker transmission.   

## 2015-03-04 ENCOUNTER — Telehealth: Payer: Self-pay | Admitting: *Deleted

## 2015-03-04 NOTE — Telephone Encounter (Signed)
CALLED PATIENT TO INFORM OF GOLD SEED PLACEMENT ON 03-19-15 - ARRIVAL TIME - 7:30 AM @ DR. Noland Fordyce OFFICE AND HIS SIM ON 03-22-15 @ 2 PM @ DR. MANNING'S OFFICE, SPOKE WITH PATIENT AND HE IS AWARE OF THESE APPTS.

## 2015-03-19 ENCOUNTER — Telehealth: Payer: Self-pay | Admitting: *Deleted

## 2015-03-19 LAB — CUP PACEART REMOTE DEVICE CHECK
Brady Statistic AP VS Percent: 1 %
Brady Statistic AS VP Percent: 68 %
Implantable Lead Implant Date: 20150820
Implantable Lead Implant Date: 20150820
Implantable Lead Location: 753860
Lead Channel Impedance Value: 340 Ohm
Lead Channel Impedance Value: 480 Ohm
Lead Channel Pacing Threshold Amplitude: 1 V
Lead Channel Pacing Threshold Amplitude: 1.25 V
Lead Channel Pacing Threshold Pulse Width: 0.4 ms
Lead Channel Pacing Threshold Pulse Width: 0.4 ms
Lead Channel Sensing Intrinsic Amplitude: 5.7 mV
Lead Channel Setting Pacing Amplitude: 2 V
Lead Channel Setting Pacing Pulse Width: 0.4 ms
Lead Channel Setting Sensing Sensitivity: 2 mV
MDC IDC LEAD IMPLANT DT: 20150820
MDC IDC LEAD LOCATION: 753858
MDC IDC LEAD LOCATION: 753859
MDC IDC MSMT BATTERY REMAINING LONGEVITY: 96 mo
MDC IDC MSMT BATTERY REMAINING PERCENTAGE: 95.5 %
MDC IDC MSMT BATTERY VOLTAGE: 2.99 V
MDC IDC MSMT LEADCHNL RA IMPEDANCE VALUE: 460 Ohm
MDC IDC MSMT LEADCHNL RA PACING THRESHOLD AMPLITUDE: 0.75 V
MDC IDC MSMT LEADCHNL RA PACING THRESHOLD PULSEWIDTH: 0.4 ms
MDC IDC MSMT LEADCHNL RA SENSING INTR AMPL: 4.5 mV
MDC IDC PG MODEL: 3242
MDC IDC PG SERIAL: 7614436
MDC IDC SESS DTM: 20170107082124
MDC IDC SET LEADCHNL LV PACING AMPLITUDE: 2.5 V
MDC IDC SET LEADCHNL RV PACING AMPLITUDE: 2.25 V
MDC IDC SET LEADCHNL RV PACING PULSEWIDTH: 0.4 ms
MDC IDC STAT BRADY AP VP PERCENT: 29 %
MDC IDC STAT BRADY AS VS PERCENT: 2.2 %
MDC IDC STAT BRADY RA PERCENT PACED: 15 %

## 2015-03-19 NOTE — Telephone Encounter (Signed)
Spoke to patient on "work" number about abnormal thoracic impedance measurement. Patient denies any ShOB, LE edema, or orthopnea at present. He stated that he does take an extra PRN lasix occasionally.   Because of his h/o CHF, I also talked to patient about enrolling in our [facility] clinic. Patient stated that he would like to join. Will inform Sharman Cheek, RN about this so that he can be enrolled.  Patient aware that Margarita Grizzle will contact him with further details.

## 2015-03-19 NOTE — Telephone Encounter (Signed)
Attempted call x 1-home #---N/A, unable to LM. sss

## 2015-03-20 ENCOUNTER — Encounter: Payer: Self-pay | Admitting: Cardiology

## 2015-03-22 ENCOUNTER — Ambulatory Visit
Admission: RE | Admit: 2015-03-22 | Discharge: 2015-03-22 | Disposition: A | Discharge status: Home / Self Care | Payer: Medicare HMO | Source: Ambulatory Visit | Attending: Radiation Oncology | Admitting: Radiation Oncology

## 2015-03-22 DIAGNOSIS — C61 Malignant neoplasm of prostate: Secondary | ICD-10-CM

## 2015-03-22 DIAGNOSIS — Z510 Encounter for antineoplastic radiation therapy: Secondary | ICD-10-CM | POA: Diagnosis not present

## 2015-03-22 NOTE — Progress Notes (Signed)
  Radiation Oncology         [phone] ________________________________  Name: [name] MRN: [id number]  Date: 03/22/2015  DOB: [birth year]  SIMULATION AND TREATMENT PLANNING NOTE    ICD-9-CM ICD-10-CM   1. Malignant neoplasm of prostate ([place]) 185 C61     DIAGNOSIS:  65 y.o. gentleman with stage T1c adenocarcinoma of the prostate with a Gleason's score of 3+4 and a PSA of 8.08  NARRATIVE:  The patient was brought to the [facility].  Identity was confirmed.  All relevant records and images related to the planned course of therapy were reviewed.  The patient freely provided informed written consent to proceed with treatment after reviewing the details related to the planned course of therapy. The consent form was witnessed and verified by the simulation staff.  Then, the patient was set-up in a stable reproducible supine position for radiation therapy.  A vacuum lock pillow device was custom fabricated to position his legs in a reproducible immobilized position.  Then, I performed a urethrogram under sterile conditions to identify the prostatic apex.  CT images were obtained.  Surface markings were placed.  The CT images were loaded into the planning software.  Then the prostate target and avoidance structures including the rectum, bladder, bowel and hips were contoured.  Treatment planning then occurred.  The radiation prescription was entered and confirmed.  A total of one complex treatment devices were fabricated. I have requested : Intensity Modulated Radiotherapy (IMRT) is medically necessary for this case for the following reason:  Rectal sparing.Marland Kitchen  PLAN:  The patient will receive 78 Gy in 40 fractions.  ________________________________  Sheral Apley Tammi Klippel, M.D.  This document serves as a record of services personally performed by [name], MD. It was created on his behalf by Arlyce Harman, a trained medical scribe. The creation of this record is based on  the scribe's personal observations and the provider's statements to them. This document has been checked and approved by the attending provider.

## 2015-03-26 DIAGNOSIS — Z510 Encounter for antineoplastic radiation therapy: Secondary | ICD-10-CM | POA: Diagnosis not present

## 2015-03-27 ENCOUNTER — Ambulatory Visit: Payer: Medicare HMO | Admitting: Gastroenterology

## 2015-04-02 ENCOUNTER — Telehealth: Payer: Self-pay

## 2015-04-02 ENCOUNTER — Ambulatory Visit
Admission: RE | Admit: 2015-04-02 | Discharge: 2015-04-02 | Disposition: A | Discharge status: Home / Self Care | Payer: Medicare HMO | Source: Ambulatory Visit | Attending: Radiation Oncology | Admitting: Radiation Oncology

## 2015-04-02 DIAGNOSIS — Z510 Encounter for antineoplastic radiation therapy: Secondary | ICD-10-CM | POA: Diagnosis not present

## 2015-04-02 NOTE — Telephone Encounter (Addendum)
Patient referred to [facility] clinic by Memory Dance, device team.  Attempted call to patient at home and work number for [facility] introduction and enrollment.  No answer and no answering machine.

## 2015-04-03 ENCOUNTER — Ambulatory Visit
Admission: RE | Admit: 2015-04-03 | Discharge: 2015-04-03 | Disposition: A | Discharge status: Home / Self Care | Payer: Medicare HMO | Source: Ambulatory Visit | Attending: Radiation Oncology | Admitting: Radiation Oncology

## 2015-04-03 DIAGNOSIS — Z510 Encounter for antineoplastic radiation therapy: Secondary | ICD-10-CM | POA: Diagnosis not present

## 2015-04-04 ENCOUNTER — Ambulatory Visit
Admission: RE | Admit: 2015-04-04 | Discharge: 2015-04-04 | Disposition: A | Discharge status: Home / Self Care | Payer: Medicare HMO | Source: Ambulatory Visit | Attending: Radiation Oncology | Admitting: Radiation Oncology

## 2015-04-04 DIAGNOSIS — Z510 Encounter for antineoplastic radiation therapy: Secondary | ICD-10-CM | POA: Diagnosis not present

## 2015-04-05 ENCOUNTER — Ambulatory Visit
Admission: RE | Admit: 2015-04-05 | Discharge: 2015-04-05 | Disposition: A | Discharge status: Home / Self Care | Payer: Medicare HMO | Source: Ambulatory Visit | Attending: Radiation Oncology | Admitting: Radiation Oncology

## 2015-04-05 ENCOUNTER — Encounter: Payer: Self-pay | Admitting: Radiation Oncology

## 2015-04-05 VITALS — BP 124/85 | HR 60 | Resp 16 | Wt 155.3 lb

## 2015-04-05 DIAGNOSIS — C61 Malignant neoplasm of prostate: Secondary | ICD-10-CM

## 2015-04-05 DIAGNOSIS — Z510 Encounter for antineoplastic radiation therapy: Secondary | ICD-10-CM | POA: Diagnosis not present

## 2015-04-05 NOTE — Progress Notes (Addendum)
Weight and vitals stable. Denies pain. Reports nocturia x 2. Denies dysuria or hematuria. Denies diarrhea but, does report a soft stool. Reports occasional incontinence and leakage. Reports urgency. Describes a strong steady urine stream and denies difficulty emptying his bladder. Reports fatigue. Oriented patient to staff and routine of the clinic. Provided patient with RADIATION THERAPY AND YOU handbook then, reviewed pertinent information. Educated patient reference potential side effects and management such as, fatigue, diarrhea and urinary/bladder changes. Encouraged patient to contact this RN and provided him with my contact information. Answered all patient questions to the best of my ability. Patient verbalized understanding of all reviewed.  BP 124/85 mmHg  Pulse 60  Resp 16  Wt 155 lb 4.8 oz (70.444 kg)  SpO2 100% Wt Readings from Last 3 Encounters:  04/05/15 155 lb 4.8 oz (70.444 kg)  02/25/15 157 lb 6.4 oz (71.396 kg)  01/23/15 151 lb 0.2 oz (68.5 kg)

## 2015-04-05 NOTE — Progress Notes (Signed)
  Radiation Oncology         [phone]   Name: [name] MRN: [id number]   Date: 04/05/2015  DOB: 10-06-1950   Weekly Radiation Therapy Management    ICD-9-CM ICD-10-CM   1. Malignant neoplasm of prostate ([facility]) 185 C61     Current Dose: 7.8 Gy  Planned Dose:  78 Gy  Narrative The patient presents for routine under treatment assessment. Weight and vitals stable. Denies pain. Reports nocturia x 2. Denies dysuria or hematuria. Denies diarrhea but, does report a soft stool. Reports occasional incontinence and leakage. Reports urgency. Describes a strong steady urine stream and denies difficulty emptying his bladder. Reports fatigue. The patient is without complaint. Set-up films were reviewed. The chart was checked.  Physical Findings  weight is 155 lb 4.8 oz (70.444 kg). His blood pressure is 124/85 and his pulse is 60. His respiration is 16 and oxygen saturation is 100%. . Weight essentially stable.  No significant changes.  Impression The patient is tolerating radiation.  Plan Continue treatment as planned. We discussed the possibility of using Flomax.    Sheral Apley Tammi Klippel, M.D.   This document serves as a record of services personally performed by [name], MD. It was created on his behalf by Derek Mound, a trained medical scribe. The creation of this record is based on the scribe's personal observations and the provider's statements to them. This document has been checked and approved by the attending provider.

## 2015-04-08 ENCOUNTER — Ambulatory Visit
Admission: RE | Admit: 2015-04-08 | Discharge: 2015-04-08 | Disposition: A | Discharge status: Home / Self Care | Payer: Medicare HMO | Source: Ambulatory Visit | Attending: Radiation Oncology | Admitting: Radiation Oncology

## 2015-04-08 DIAGNOSIS — Z510 Encounter for antineoplastic radiation therapy: Secondary | ICD-10-CM | POA: Diagnosis not present

## 2015-04-09 ENCOUNTER — Ambulatory Visit
Admission: RE | Admit: 2015-04-09 | Discharge: 2015-04-09 | Disposition: A | Discharge status: Home / Self Care | Payer: Medicare HMO | Source: Ambulatory Visit | Attending: Radiation Oncology | Admitting: Radiation Oncology

## 2015-04-09 DIAGNOSIS — Z510 Encounter for antineoplastic radiation therapy: Secondary | ICD-10-CM | POA: Diagnosis not present

## 2015-04-10 ENCOUNTER — Ambulatory Visit
Admission: RE | Admit: 2015-04-10 | Discharge: 2015-04-10 | Disposition: A | Discharge status: Home / Self Care | Payer: Medicare HMO | Source: Ambulatory Visit | Attending: Radiation Oncology | Admitting: Radiation Oncology

## 2015-04-10 DIAGNOSIS — Z510 Encounter for antineoplastic radiation therapy: Secondary | ICD-10-CM | POA: Diagnosis not present

## 2015-04-11 ENCOUNTER — Ambulatory Visit
Admission: RE | Admit: 2015-04-11 | Discharge: 2015-04-11 | Disposition: A | Discharge status: Home / Self Care | Payer: Medicare HMO | Source: Ambulatory Visit | Attending: Radiation Oncology | Admitting: Radiation Oncology

## 2015-04-11 DIAGNOSIS — Z510 Encounter for antineoplastic radiation therapy: Secondary | ICD-10-CM | POA: Diagnosis not present

## 2015-04-12 ENCOUNTER — Ambulatory Visit
Admission: RE | Admit: 2015-04-12 | Discharge: 2015-04-12 | Disposition: A | Discharge status: Home / Self Care | Payer: Medicare HMO | Source: Ambulatory Visit | Attending: Radiation Oncology | Admitting: Radiation Oncology

## 2015-04-12 ENCOUNTER — Encounter: Payer: Self-pay | Admitting: Radiation Oncology

## 2015-04-12 VITALS — BP 131/91 | HR 61 | Temp 97.9°F | Resp 16 | Ht 67.0 in | Wt 154.4 lb

## 2015-04-12 DIAGNOSIS — C61 Malignant neoplasm of prostate: Secondary | ICD-10-CM

## 2015-04-12 DIAGNOSIS — Z510 Encounter for antineoplastic radiation therapy: Secondary | ICD-10-CM | POA: Diagnosis not present

## 2015-04-12 NOTE — Progress Notes (Signed)
  Radiation Oncology         [phone]   Name: [name] MRN: [id number]   Date: 04/12/2015  DOB: 06/10/1950     Weekly Radiation Therapy Management    ICD-9-CM ICD-10-CM   1. Malignant neoplasm of prostate ([facility]) 185 C61     Current Dose: 17.55 Gy  Planned Dose:  78 Gy  Narrative The patient presents for routine under treatment assessment.  Kamal Halfhill has completed 9 fractions to his prostate. He continues to reports pain in his upper abdomen from a hernia. He is taking oxycodone once a day. He reports having occasional dysuria. He denies having hematuria. He reports that his urinary stream seems weaker. He reports urgency with occasional incontinence. He reports nocturia x 2-3. He reports having loose stools but not diarrhea. He reports having fatigue.  Set-up films were reviewed. The chart was checked.  Physical Findings  height is 5\' 7"  (1.702 m) and weight is 154 lb 6.4 oz (70.035 kg). His oral temperature is 97.9 F (36.6 C). His blood pressure is 131/91 and his pulse is 61. His respiration is 16. . Weight essentially stable.  No significant changes.  Impression The patient is tolerating radiation.  Plan Continue treatment as planned.     Sheral Apley Tammi Klippel, M.D.   This document serves as a record of services personally performed by [name], MD. It was created on his behalf by Arlyce Harman, a trained medical scribe. The creation of this record is based on the scribe's personal observations and the provider's statements to them. This document has been checked and approved by the attending provider.

## 2015-04-12 NOTE — Progress Notes (Signed)
[name] has completed 9 fractions to his prostate.  He continues to reports pain in his upper abdomen from a hernia.  He is taking oxycodone once a day.  He reports having occasional dysuria.  He denies having hematuria.  He reports that his urinary stream seems weaker.  He reports urgency with occasional incontinence.  He reports nocturia x 2-3.  He reports having loose stools but not diarrhea.  He reports having fatigue.  BP 131/91 mmHg  Pulse 61  Temp(Src) 97.9 F (36.6 C) (Oral)  Resp 16  Ht 5\' 7"  (1.702 m)  Wt 154 lb 6.4 oz (70.035 kg)  BMI 24.18 kg/m2

## 2015-04-15 ENCOUNTER — Ambulatory Visit
Admission: RE | Admit: 2015-04-15 | Discharge: 2015-04-15 | Disposition: A | Discharge status: Home / Self Care | Payer: Medicare HMO | Source: Ambulatory Visit | Attending: Radiation Oncology | Admitting: Radiation Oncology

## 2015-04-15 DIAGNOSIS — Z510 Encounter for antineoplastic radiation therapy: Secondary | ICD-10-CM | POA: Insufficient documentation

## 2015-04-15 DIAGNOSIS — C61 Malignant neoplasm of prostate: Secondary | ICD-10-CM | POA: Insufficient documentation

## 2015-04-15 DIAGNOSIS — R42 Dizziness and giddiness: Secondary | ICD-10-CM | POA: Insufficient documentation

## 2015-04-15 NOTE — Progress Notes (Addendum)
Radiation Oncology         [phone]   Name: [name] MRN: [id number]   Date: 04/15/2015  DOB: [birth year]     Weekly Radiation Therapy Management    ICD-9-CM ICD-10-CM   1. Malignant neoplasm of prostate ([place]) 185 C61   2. Dizziness 780.4 R42     Current Dose: 19.5Gy  Planned Dose:  78 Gy  Narrative The patient presents to clinic this afternoon for further assessment of dizziness, and lightheadedness.  [name] has completed 10 fractions to his prostate.  On review of systems, he states that he told the treatment staff today that since starting [place] he has noticed increasing dizziness and feeling light headed. He reports that he has not fallen at home, but states that he feels unsteady. He describes these episodes as daily and that usually resting seems to help alleviate his symptoms. He has a significant cardiac history including CHF most recently with an exacerbation in December 2016, a dilated cardiomyopathy, and pacemaker. He is not a diabetic. He has been using Meclizine as directed by his cardiologist for what sounds to be vertigo. Reports he saw his cardiologist about a week to a week and a half ago. He has been noticing more frequent headaches and intermittent episodes of nausea since starting Meclazine. He does also report changes in visual acuity, and has not been seen by an opthalmologist in years. He denies diplopia or ringining in the ears. Reports a poor appetite, but is eating small frequent meals. He continues to take his medications as directed. He denies any dysuria or hematuria. He denies any frequency at this time. No other complaints are verbalized.   Physical Findings  Vitals with BMI 04/15/2015  Height   Weight 148 lbs 3 oz  BMI   Systolic [id number]  Diastolic 77  Pulse 64  Respirations 16  BP was repeated while standing and did not meet criteria for orthostatic change. Pain scale 0/10  In general this is a well appearing African American male in no  acute distress.Holosystolic murmur is present and radiates over the entire precordium. Chest is clear to ausculation bilaterally. HEENT reveals that he is normocephalic, atraumatic. EOMs are intact. PERRLA. Bilateral ears are assessed and no evidence of cerumen impaction is noted bilaterally, the external auditory canal is intact without visible abnormalities, the TMs are intact without fluid posterior. The abdomen is intact with BSx4, is soft, non tender, non distended. Lower extremities are negative for pretibial pitting edema.   Impression  Dizziness with headache and blurred vision.  Plan We discussed that there do not appear to be any focal findings or acute abnormalities to suggest his symptoms. However given his significant cardiac history and recent CHF exacerbation, I recommend that the patient contact his cardiologist for further evaluation. He has noticed these symptoms worsening since meclizine, and I have encouraged him to consider discontinuing this if approved by cardiology. He should also contact opthalmology for further assessment as it has been many years since his last eye exam. He is in agreement and will keep [place] informed of his progress. We also discussed that if these recommendations do not seem to impact his symptoms, being evaluated for inner ear abnormalities by an ENT would be advised. He states agreement and understanding.   Carola Rhine, PAC  This document serves as a record of services personally performed by Shona Simpson, PAC. It was created on her behalf by Darcus Austin, a trained medical scribe. The creation of  this record is based on the scribe's personal observations and the provider's statements to them. This document has been checked and approved by the attending provider.

## 2015-04-15 NOTE — Progress Notes (Signed)
Vitals sitting and standing stable. Patient requesting to be evaluated for feeling lightheaded, off balance and dizzy "since taking all these medications." Patient has a pacemaker and a history of heart failure. Reports taking meclizine as directed. Denies nausea or vomiting. Reports headaches. Denies diplopia or ringing in the ears. Discussed death of mother and brother both due to cancer. Patient's affect is flat. Weight loss noted. Patient confirms his appetite "isn't what it used to be" but, he is eating small frequency meals.  BP 126/77 mmHg  Pulse 64  Temp(Src) 97.4 F (36.3 C) (Oral)  Resp 16  Wt 148 lb 3.2 oz (67.223 kg)  SpO2 100% Wt Readings from Last 3 Encounters:  04/15/15 148 lb 3.2 oz (67.223 kg)  04/12/15 154 lb 6.4 oz (70.035 kg)  04/05/15 155 lb 4.8 oz (70.444 kg)

## 2015-04-16 ENCOUNTER — Encounter (HOSPITAL_COMMUNITY): Payer: Self-pay | Admitting: Emergency Medicine

## 2015-04-16 ENCOUNTER — Ambulatory Visit
Admission: RE | Admit: 2015-04-16 | Discharge: 2015-04-16 | Disposition: A | Discharge status: Home / Self Care | Payer: Medicare HMO | Source: Ambulatory Visit | Attending: Radiation Oncology | Admitting: Radiation Oncology

## 2015-04-16 ENCOUNTER — Encounter: Payer: Self-pay | Admitting: Radiation Oncology

## 2015-04-16 ENCOUNTER — Emergency Department (HOSPITAL_COMMUNITY)
Admission: EM | Admit: 2015-04-16 | Discharge: 2015-04-16 | Disposition: A | Discharge status: Home / Self Care | Payer: Medicare HMO | Attending: Emergency Medicine | Admitting: Emergency Medicine

## 2015-04-16 DIAGNOSIS — R42 Dizziness and giddiness: Secondary | ICD-10-CM | POA: Diagnosis not present

## 2015-04-16 DIAGNOSIS — R2689 Other abnormalities of gait and mobility: Secondary | ICD-10-CM | POA: Insufficient documentation

## 2015-04-16 DIAGNOSIS — I129 Hypertensive chronic kidney disease with stage 1 through stage 4 chronic kidney disease, or unspecified chronic kidney disease: Secondary | ICD-10-CM | POA: Insufficient documentation

## 2015-04-16 DIAGNOSIS — J449 Chronic obstructive pulmonary disease, unspecified: Secondary | ICD-10-CM | POA: Diagnosis not present

## 2015-04-16 DIAGNOSIS — N182 Chronic kidney disease, stage 2 (mild): Secondary | ICD-10-CM | POA: Diagnosis not present

## 2015-04-16 DIAGNOSIS — I509 Heart failure, unspecified: Secondary | ICD-10-CM | POA: Diagnosis not present

## 2015-04-16 NOTE — Progress Notes (Signed)
HPI: The patient presented again after treatment for evaluation to the clinic. He was reportedly walking into the walls in the treatment area. He states that he has been having progressive dizziness despite stopping his meclizine as outlined yesterday. He has had blurry vision that has also seemed to be more noticeable. He denies any chest pain or shortness of breath. He denies changes in speech. No other complaints are verbalized.  PE:  In general this is a well appearing African American male in no acute distress. He's alert and oriented x4 and appropriate throughout the exam. Cardiovascular exam reveals a regular rate and rhythm. No clicks, rubs, or murmurs are noted. Chest is clear to auscultation bilaterally. There is a right carotid bruit noted, and nothing noted on the left. No palpable adenopathy is noted of the neck. With walking he does stumble a bit when standing, but does not have any gait disturbances once he takes a few steps.  Impression/Plan: 1. Carotid bruit with ataxia and dizziness. I have recommended the patient proceed to the ER for stat evaluation and likely brain imaging as he has a high risk cardiovascular history with ongoing anticoagulation.  2. Prostate Cancer. The patient will continue his external radiation therapy and we will plan for this tomorrow regardless of his hospital disposition. He states agreement and understanding.  Carola Rhine, PAC

## 2015-04-16 NOTE — ED Notes (Signed)
Pt called from triage, no answer 

## 2015-04-16 NOTE — ED Notes (Signed)
Patient sent from cancer center, being treated for prostate CA, received radiation today. C/o unsteady gait and dizziness for unsure amount of time. Denies difficulty swallowing, numbness or tingling, weakness.

## 2015-04-16 NOTE — ED Notes (Signed)
Pt called again from triage, no answer 

## 2015-04-17 ENCOUNTER — Ambulatory Visit
Admission: RE | Admit: 2015-04-17 | Discharge: 2015-04-17 | Disposition: A | Discharge status: Home / Self Care | Payer: Medicare HMO | Source: Ambulatory Visit | Attending: Radiation Oncology | Admitting: Radiation Oncology

## 2015-04-17 DIAGNOSIS — C61 Malignant neoplasm of prostate: Secondary | ICD-10-CM | POA: Diagnosis present

## 2015-04-17 DIAGNOSIS — Z510 Encounter for antineoplastic radiation therapy: Secondary | ICD-10-CM | POA: Diagnosis present

## 2015-04-18 ENCOUNTER — Ambulatory Visit
Admission: RE | Admit: 2015-04-18 | Discharge: 2015-04-18 | Disposition: A | Discharge status: Home / Self Care | Payer: Medicare HMO | Source: Ambulatory Visit | Attending: Radiation Oncology | Admitting: Radiation Oncology

## 2015-04-18 DIAGNOSIS — Z510 Encounter for antineoplastic radiation therapy: Secondary | ICD-10-CM | POA: Diagnosis not present

## 2015-04-19 ENCOUNTER — Ambulatory Visit
Admission: RE | Admit: 2015-04-19 | Discharge: 2015-04-19 | Disposition: A | Discharge status: Home / Self Care | Payer: Medicare HMO | Source: Ambulatory Visit | Attending: Radiation Oncology | Admitting: Radiation Oncology

## 2015-04-19 VITALS — BP 129/87 | HR 64 | Resp 16 | Wt 153.7 lb

## 2015-04-19 DIAGNOSIS — C61 Malignant neoplasm of prostate: Secondary | ICD-10-CM

## 2015-04-19 DIAGNOSIS — Z510 Encounter for antineoplastic radiation therapy: Secondary | ICD-10-CM | POA: Diagnosis not present

## 2015-04-19 NOTE — Progress Notes (Signed)
Weight and vitals stable. Reports discomfort related to effects of hiatal hernia. Started on pravastatin this week. Reports a week stream. He reports dysuria is more frequent and intense. He denies having hematuria. He reports that his urinary stream seems weaker. He reports urgency with occasional incontinence/leakage. He reports nocturia x 2-3. He reports having loose stools but not diarrhea. He reports having fatigue.  BP 129/87 mmHg  Pulse 64  Resp 16  Wt 153 lb 11.2 oz (69.718 kg)  SpO2 100% Wt Readings from Last 3 Encounters:  04/19/15 153 lb 11.2 oz (69.718 kg)  04/15/15 148 lb 3.2 oz (67.223 kg)  04/12/15 154 lb 6.4 oz (70.035 kg)

## 2015-04-19 NOTE — Progress Notes (Signed)
  Radiation Oncology         [phone]   Name: [name] MRN: [id number]   Date: 04/19/2015  DOB: Jul 06, 1950     Weekly Radiation Therapy Management    ICD-9-CM ICD-10-CM   1. Malignant neoplasm of prostate ([place]) 185 C61     Current Dose: 27.3 Gy  Planned Dose:  78 Gy  Narrative The patient presents for routine under treatment assessment.  Weight and vitals stable. Reports discomfort related to effects of hiatal hernia. Started on pravastatin this week. He reports dysuria is more frequent and intense. He denies having hematuria. He reports that his urinary stream seems weaker. He reports urgency with occasional incontinence/leakage. He reports nocturia x 2-3. He reports having loose stools but not diarrhea. He reports having fatigue.   Set-up films were reviewed. The chart was checked.  Physical Findings  weight is 153 lb 11.2 oz (69.718 kg). His blood pressure is 129/87 and his pulse is 64. His respiration is 16 and oxygen saturation is 100%. . Weight essentially stable.  No significant changes.  Impression The patient is tolerating radiation.  Plan Continue treatment as planned.     Sheral Apley Tammi Klippel, M.D.   This document serves as a record of services personally performed by [name], MD. It was created on his behalf by Lendon Collar, a trained medical scribe. The creation of this record is based on the scribe's personal observations and the provider's statements to them. This document has been checked and approved by the attending provider.

## 2015-04-22 ENCOUNTER — Ambulatory Visit
Admission: RE | Admit: 2015-04-22 | Discharge: 2015-04-22 | Disposition: A | Discharge status: Home / Self Care | Payer: Medicare HMO | Source: Ambulatory Visit | Attending: Radiation Oncology | Admitting: Radiation Oncology

## 2015-04-22 ENCOUNTER — Ambulatory Visit: Payer: Medicare HMO

## 2015-04-23 ENCOUNTER — Ambulatory Visit
Admission: RE | Admit: 2015-04-23 | Discharge: 2015-04-23 | Disposition: A | Discharge status: Home / Self Care | Payer: Medicare HMO | Source: Ambulatory Visit | Attending: Radiation Oncology | Admitting: Radiation Oncology

## 2015-04-23 DIAGNOSIS — Z510 Encounter for antineoplastic radiation therapy: Secondary | ICD-10-CM | POA: Diagnosis not present

## 2015-04-24 ENCOUNTER — Ambulatory Visit
Admission: RE | Admit: 2015-04-24 | Discharge: 2015-04-24 | Disposition: A | Discharge status: Home / Self Care | Payer: Medicare HMO | Source: Ambulatory Visit | Attending: Radiation Oncology | Admitting: Radiation Oncology

## 2015-04-24 ENCOUNTER — Encounter: Payer: Self-pay | Admitting: Radiation Oncology

## 2015-04-24 DIAGNOSIS — Z510 Encounter for antineoplastic radiation therapy: Secondary | ICD-10-CM | POA: Diagnosis not present

## 2015-04-25 ENCOUNTER — Ambulatory Visit
Admission: RE | Admit: 2015-04-25 | Discharge: 2015-04-25 | Disposition: A | Discharge status: Home / Self Care | Payer: Medicare HMO | Source: Ambulatory Visit | Attending: Radiation Oncology | Admitting: Radiation Oncology

## 2015-04-25 DIAGNOSIS — Z510 Encounter for antineoplastic radiation therapy: Secondary | ICD-10-CM | POA: Diagnosis not present

## 2015-04-26 ENCOUNTER — Ambulatory Visit
Admission: RE | Admit: 2015-04-26 | Discharge: 2015-04-26 | Disposition: A | Discharge status: Home / Self Care | Payer: Medicare HMO | Source: Ambulatory Visit | Attending: Radiation Oncology | Admitting: Radiation Oncology

## 2015-04-26 VITALS — BP 145/97 | HR 60 | Resp 16 | Wt 154.4 lb

## 2015-04-26 DIAGNOSIS — Z510 Encounter for antineoplastic radiation therapy: Secondary | ICD-10-CM | POA: Diagnosis not present

## 2015-04-26 DIAGNOSIS — R1013 Epigastric pain: Principal | ICD-10-CM

## 2015-04-26 DIAGNOSIS — G8929 Other chronic pain: Secondary | ICD-10-CM

## 2015-04-26 DIAGNOSIS — C61 Malignant neoplasm of prostate: Secondary | ICD-10-CM

## 2015-04-26 NOTE — Progress Notes (Signed)
  Radiation Oncology         [phone]   Name: [name] MRN: [id number]   Date: 04/26/2015  DOB: May 07, 1950     Weekly Radiation Therapy Management    ICD-9-CM ICD-10-CM   1. Abdominal pain, chronic, epigastric 789.06 R10.13 Ambulatory referral to Gastroenterology   338.29 G89.29   2. Malignant neoplasm of prostate ([facility]) 185 C61     Current Dose: 35.1 Gy  Planned Dose:  78 Gy  Narrative The patient presents for routine under treatment assessment.  Weight stable. BP elevated. Reports discomfort related to effects of hiatal hernia. Reports weak urine stream. Reports dysuria at the end of his urine stream. Denies hematuria. Reports a weak urine stream. Reports urgency with occasional incontinence/leakage continues. He does not wear a pad. Reports intermittent dizziness when urgency starts. He is taking lasix. Reports nocturia x 2-3. Denies diarrhea. Reports mild fatigue.   Set-up films were reviewed. The chart was checked.  Physical Findings  weight is 154 lb 6.4 oz (70.035 kg). His blood pressure is 145/97 and his pulse is 60. His respiration is 16 and oxygen saturation is 100%. . Weight essentially stable.  No significant changes.  Impression The patient is tolerating radiation.  Plan Continue treatment as planned.     Sheral Apley Tammi Klippel, M.D.   This document serves as a record of services personally performed by [name], MD. It was created on his behalf by Arlyce Harman, a trained medical scribe. The creation of this record is based on the scribe's personal observations and the provider's statements to them. This document has been checked and approved by the attending provider.

## 2015-04-26 NOTE — Progress Notes (Addendum)
Weight stable. BP elevated. Reports discomfort related to effects of hiatal hernia. Reports weak urine stream. Reports dysuria at the end of his urine stream. Denies hematuria. Reports a weak urine stream. Reports urgency with occasional incontinence/leakage continues. Reports nocturia x 2-3. Denies diarrhea. Reports mild fatigue.   BP 145/97 mmHg  Pulse 60  Resp 16  Wt 154 lb 6.4 oz (70.035 kg)  SpO2 100% Wt Readings from Last 3 Encounters:  04/26/15 154 lb 6.4 oz (70.035 kg)  04/19/15 153 lb 11.2 oz (69.718 kg)  04/15/15 148 lb 3.2 oz (67.223 kg)

## 2015-04-29 ENCOUNTER — Ambulatory Visit
Admission: RE | Admit: 2015-04-29 | Discharge: 2015-04-29 | Disposition: A | Discharge status: Home / Self Care | Payer: Medicare HMO | Source: Ambulatory Visit | Attending: Radiation Oncology | Admitting: Radiation Oncology

## 2015-04-29 ENCOUNTER — Telehealth: Payer: Self-pay | Admitting: *Deleted

## 2015-04-29 DIAGNOSIS — Z510 Encounter for antineoplastic radiation therapy: Secondary | ICD-10-CM | POA: Diagnosis not present

## 2015-04-29 NOTE — Telephone Encounter (Signed)
CALLED PATIENT TO INFORM OF APPT. WITH DR. DANIEL JACOBS (GI) TO INFORM OF APPT. ON 07-02-15- ARRIVAL TIME - 1:45 PM, NO ANSWER WILL CALL LATER.

## 2015-04-30 ENCOUNTER — Telehealth: Payer: Self-pay | Admitting: *Deleted

## 2015-04-30 ENCOUNTER — Ambulatory Visit
Admission: RE | Admit: 2015-04-30 | Discharge: 2015-04-30 | Disposition: A | Discharge status: Home / Self Care | Payer: Medicare HMO | Source: Ambulatory Visit | Attending: Radiation Oncology | Admitting: Radiation Oncology

## 2015-04-30 DIAGNOSIS — Z510 Encounter for antineoplastic radiation therapy: Secondary | ICD-10-CM | POA: Diagnosis not present

## 2015-04-30 NOTE — Telephone Encounter (Signed)
CALLED PATIENT TO INFORM OF APPT. WITH DR. DANIEL JACOBS ON 07-02-15 - ARRIVAL TIME - 1:45 PM, SPOKE WITH PATIENT AND HE IS AWARE OF THIS APPT.

## 2015-05-01 ENCOUNTER — Ambulatory Visit
Admission: RE | Admit: 2015-05-01 | Discharge: 2015-05-01 | Disposition: A | Discharge status: Home / Self Care | Payer: Medicare HMO | Source: Ambulatory Visit | Attending: Radiation Oncology | Admitting: Radiation Oncology

## 2015-05-01 DIAGNOSIS — Z510 Encounter for antineoplastic radiation therapy: Secondary | ICD-10-CM | POA: Diagnosis not present

## 2015-05-02 ENCOUNTER — Ambulatory Visit
Admission: RE | Admit: 2015-05-02 | Discharge: 2015-05-02 | Disposition: A | Discharge status: Home / Self Care | Payer: Medicare HMO | Source: Ambulatory Visit | Attending: Radiation Oncology | Admitting: Radiation Oncology

## 2015-05-02 DIAGNOSIS — Z510 Encounter for antineoplastic radiation therapy: Secondary | ICD-10-CM | POA: Diagnosis not present

## 2015-05-03 ENCOUNTER — Telehealth: Payer: Self-pay | Admitting: Gastroenterology

## 2015-05-03 ENCOUNTER — Ambulatory Visit
Admission: RE | Admit: 2015-05-03 | Discharge: 2015-05-03 | Disposition: A | Discharge status: Home / Self Care | Payer: Medicare HMO | Source: Ambulatory Visit | Attending: Radiation Oncology | Admitting: Radiation Oncology

## 2015-05-03 ENCOUNTER — Encounter: Payer: Self-pay | Admitting: Radiation Oncology

## 2015-05-03 VITALS — BP 126/85 | HR 66 | Resp 16 | Wt 160.0 lb

## 2015-05-03 DIAGNOSIS — Z510 Encounter for antineoplastic radiation therapy: Secondary | ICD-10-CM | POA: Diagnosis not present

## 2015-05-03 DIAGNOSIS — C61 Malignant neoplasm of prostate: Secondary | ICD-10-CM

## 2015-05-03 NOTE — Progress Notes (Signed)
Weight and vitals stable. Reports discomfort related to effects of hiatal hernia. Reports dysuria at the end of his urine stream. Denies hematuria. Reports a weak urine stream. Reports urgency with occasional incontinence/leakage continues. Reports nocturia x 2-3. Denies diarrhea. Reports a burning sensation with bowel movements. Reports mild fatigue.   BP 126/85 mmHg  Pulse 66  Resp 16  Wt 160 lb (72.576 kg)  SpO2 100% Wt Readings from Last 3 Encounters:  05/03/15 160 lb (72.576 kg)  04/26/15 154 lb 6.4 oz (70.035 kg)  04/19/15 153 lb 11.2 oz (69.718 kg)

## 2015-05-03 NOTE — Progress Notes (Signed)
  Radiation Oncology         [phone]   Name: [name] MRN: [id number]   Date: 05/03/2015  DOB: 1950-09-07     Weekly Radiation Therapy Management    ICD-9-CM ICD-10-CM   1. Malignant neoplasm of prostate ([place]) 185 C61     Current Dose: 44.85 Gy  Planned Dose:  78 Gy  Narrative The patient presents for routine under treatment assessment.  Weight and vitals stable. Reports discomfort related to effects of hiatal hernia. Reports dysuria at the end of his urine stream. Denies hematuria. Reports a weak urine stream. Reports urgency with occasional incontinence/leakage continues. Reports nocturia x 2-3. Denies diarrhea. Reports a burning sensation with bowel movements. Reports mild fatigue.  Set-up films were reviewed. The chart was checked.  Physical Findings  weight is 160 lb (72.576 kg). His blood pressure is 126/85 and his pulse is 66. His respiration is 16 and oxygen saturation is 100%. . Weight essentially stable.  No significant changes.  Impression The patient is tolerating radiation.  Plan Continue treatment as planned. He has an appointment with Dr. Ardis Hughs 06/22/2015. Shona Simpson will call to see if we can get this appointment scheduled sooner.    Sheral Apley Tammi Klippel, M.D.    This document serves as a record of services personally performed by Russell Pita, MD. It was created on his behalf by Lendon Collar, a trained medical scribe. The creation of this record is based on the scribe's personal observations and the provider's statements to them. This document has been checked and approved by the attending provider.

## 2015-05-03 NOTE — Telephone Encounter (Signed)
Caller name: Ebony Hail Relation to pt: provder Call back number: [phone]  Reason for call:  [place] wants this patient seen urgently.  Please call [facility]

## 2015-05-06 ENCOUNTER — Ambulatory Visit
Admission: RE | Admit: 2015-05-06 | Discharge: 2015-05-06 | Disposition: A | Discharge status: Home / Self Care | Payer: Medicare HMO | Source: Ambulatory Visit | Attending: Radiation Oncology | Admitting: Radiation Oncology

## 2015-05-06 DIAGNOSIS — Z510 Encounter for antineoplastic radiation therapy: Secondary | ICD-10-CM | POA: Diagnosis not present

## 2015-05-06 NOTE — Telephone Encounter (Signed)
Hiatal hernia?, abd pain with near syncope, persistent over several years.  I spoke with [name] at the cancer center and they would like him seen sooner than 07/05/15.  I have him scheduled to see [name] on 05/08/15.  The pt has been notified of the appt information.

## 2015-05-07 ENCOUNTER — Ambulatory Visit
Admission: RE | Admit: 2015-05-07 | Discharge: 2015-05-07 | Disposition: A | Discharge status: Home / Self Care | Payer: Medicare HMO | Source: Ambulatory Visit | Attending: Radiation Oncology | Admitting: Radiation Oncology

## 2015-05-07 DIAGNOSIS — Z510 Encounter for antineoplastic radiation therapy: Secondary | ICD-10-CM | POA: Diagnosis not present

## 2015-05-08 ENCOUNTER — Ambulatory Visit (INDEPENDENT_AMBULATORY_CARE_PROVIDER_SITE_OTHER): Payer: Medicare HMO | Admitting: Gastroenterology

## 2015-05-08 ENCOUNTER — Telehealth: Payer: Self-pay

## 2015-05-08 ENCOUNTER — Telehealth: Payer: Self-pay | Admitting: *Deleted

## 2015-05-08 ENCOUNTER — Ambulatory Visit
Admission: RE | Admit: 2015-05-08 | Discharge: 2015-05-08 | Disposition: A | Discharge status: Home / Self Care | Payer: Medicare HMO | Source: Ambulatory Visit | Attending: Radiation Oncology | Admitting: Radiation Oncology

## 2015-05-08 ENCOUNTER — Encounter: Payer: Self-pay | Admitting: Gastroenterology

## 2015-05-08 VITALS — BP 108/78 | HR 78 | Ht 67.0 in | Wt 154.0 lb

## 2015-05-08 DIAGNOSIS — R1013 Epigastric pain: Secondary | ICD-10-CM

## 2015-05-08 DIAGNOSIS — R1084 Generalized abdominal pain: Secondary | ICD-10-CM

## 2015-05-08 DIAGNOSIS — Z510 Encounter for antineoplastic radiation therapy: Secondary | ICD-10-CM | POA: Diagnosis not present

## 2015-05-08 DIAGNOSIS — R110 Nausea: Secondary | ICD-10-CM | POA: Diagnosis not present

## 2015-05-08 DIAGNOSIS — G8929 Other chronic pain: Secondary | ICD-10-CM | POA: Diagnosis not present

## 2015-05-08 NOTE — Telephone Encounter (Signed)
Returned patient's phone call, no answer 

## 2015-05-08 NOTE — Patient Instructions (Signed)
  You have been scheduled for an endoscopy. Please follow written instructions given to you at your visit today. If you use inhalers (even only as needed), please bring them with you on the day of your procedure.   You will be contaced by our office prior to your procedure for directions on holding your savasya.  If you do not hear from our office 1 week prior to your scheduled procedure, please call [phone] to discuss.   I appreciate the opportunity to care for you.

## 2015-05-08 NOTE — Telephone Encounter (Signed)
  05/08/2015   RE: [name] DOB: 08-21-1950 MRN: [id number]   Dear [name],    We have scheduled the above patient for an endoscopic procedure. Our records show that he is on anticoagulation therapy.   Please advise as to how long the patient may come off his therapy of Savaysa prior to the procedure, which is scheduled for 05/15/2015.  Please fax back/ or route the completed form to Ulyess Blossom or Julieanne Cotton at [phone].   Sincerely,    Phillis Haggis

## 2015-05-08 NOTE — Telephone Encounter (Signed)
Attempted ICM call to patient for ICM intro.  Voice mail message left for return call with contact information.

## 2015-05-09 ENCOUNTER — Ambulatory Visit
Admission: RE | Admit: 2015-05-09 | Discharge: 2015-05-09 | Disposition: A | Discharge status: Home / Self Care | Payer: Medicare HMO | Source: Ambulatory Visit | Attending: Radiation Oncology | Admitting: Radiation Oncology

## 2015-05-09 DIAGNOSIS — Z510 Encounter for antineoplastic radiation therapy: Secondary | ICD-10-CM | POA: Diagnosis not present

## 2015-05-10 ENCOUNTER — Ambulatory Visit
Admission: RE | Admit: 2015-05-10 | Discharge: 2015-05-10 | Disposition: A | Discharge status: Home / Self Care | Payer: Medicare HMO | Source: Ambulatory Visit | Attending: Radiation Oncology | Admitting: Radiation Oncology

## 2015-05-10 ENCOUNTER — Telehealth: Payer: Self-pay

## 2015-05-10 DIAGNOSIS — Z510 Encounter for antineoplastic radiation therapy: Secondary | ICD-10-CM | POA: Diagnosis not present

## 2015-05-10 NOTE — Telephone Encounter (Signed)
Spoke with patient and explained ICM program.  He agreed to monthly ICM follow up calls.  He stated he has a monitor at home but does fall asleep on the couch a lot.  Explained the monitor will need to be in the same room on the scheduled date in order for it to transmit automatically.  He currently is getting radiation for prostate cancer and is about halfway through the number of treatments needed.  He reported he just found out he has Hepatitis C and not sure what will be done about that.  Encouraged him to take one thing at a time if possible so he is not overwhelmed.  Remote transmission scheduled for 05/27/2015.

## 2015-05-10 NOTE — Telephone Encounter (Signed)
Derrel will hold his savaysa 5 days starting today.

## 2015-05-10 NOTE — Telephone Encounter (Signed)
We received the office note from Dr Dixie Dials MD where patient was seen 05/08/15 and the instructions were listed about the time to hold the savaysa and to use Lovenox. Spoke with Jones and informed him about the instructions however he is not going to use the Lovenox as the cost of it is high.  Dr Doylene Canard is aware of this and Aron said he will just massage his legs to prevent clots.

## 2015-05-13 ENCOUNTER — Ambulatory Visit
Admission: RE | Admit: 2015-05-13 | Discharge: 2015-05-13 | Disposition: A | Discharge status: Home / Self Care | Payer: Medicare HMO | Source: Ambulatory Visit | Attending: Radiation Oncology | Admitting: Radiation Oncology

## 2015-05-13 ENCOUNTER — Encounter: Payer: Self-pay | Admitting: Radiation Oncology

## 2015-05-13 VITALS — BP 128/90 | HR 61 | Temp 97.5°F | Ht 67.0 in | Wt 158.3 lb

## 2015-05-13 DIAGNOSIS — Z510 Encounter for antineoplastic radiation therapy: Secondary | ICD-10-CM | POA: Diagnosis not present

## 2015-05-13 DIAGNOSIS — C61 Malignant neoplasm of prostate: Secondary | ICD-10-CM

## 2015-05-13 NOTE — Progress Notes (Signed)
Regie Diemer has completed 29 fractions to his prostate.  He continues to have pain in abdomen.  He is going to have an endoscopy on Wednesday by Dr. Ardis Hughs.  He is taking oxycodone 5 mg - 1/2 tablet twice a day.  He said the pain is worse when standing or eating.  He reports feeling fatigued.  He reports having dysuria and urinating small amounts.  He reports having occasional incontinence of urine.  He reports nocturia 2-3 times.  He denies having hematuria.  He reports having diarrhea over the weekend, about 5-6 times.    BP 128/90 mmHg  Pulse 61  Temp(Src) 97.5 F (36.4 C) (Oral)  Ht 5\' 7"  (1.702 m)  Wt 158 lb 4.8 oz (71.804 kg)  BMI 24.79 kg/m2   Wt Readings from Last 3 Encounters:  05/13/15 158 lb 4.8 oz (71.804 kg)  05/08/15 154 lb (69.854 kg)  05/03/15 160 lb (72.576 kg)

## 2015-05-13 NOTE — Progress Notes (Signed)
  Radiation Oncology         [phone]   Name:  [name] MRN: [id number]   Date: 05/13/2015  DOB: 30-Aug-1950     Weekly Radiation Therapy Management    ICD-9-CM ICD-10-CM   1. Malignant neoplasm of prostate ([facility]) 185 C61     Current Dose: 56.55 Gy  Planned Dose:  78 Gy  Narrative The patient presents for routine under treatment assessment.  The patient has completed 29 fractions to his prostate. He continues to have pain in his abdomen. He is going to have an endoscopy on Wendesday by Dr. Ardis Hughs. He is taking oxycodone 5 mg - 1/2 tablet twice a day. He said the pain is worse when standing or eating. He reports feeling fatigued. He reports having dysuria and urinating small amounts. He reports having occasional incontinence of urine. He reports nocturia 2-3 times, which keeps him up at night. He denies hematuria. He reports having diarrhea over the weekend, about 5-6 times. He also reports headaches for the last week.  Set-up films were reviewed. The chart was checked.  Physical Findings  height is 5\' 7"  (1.702 m) and weight is 158 lb 4.8 oz (71.804 kg). His oral temperature is 97.5 F (36.4 C). His blood pressure is 128/90 and his pulse is 61.  Weight essentially stable.  No significant changes. The patient is in a wheelchair.  Impression The patient is tolerating radiation. I advised the patient to take OTC AZO for his dysuria and Imodium for his diarrhea.  Plan Continue treatment as planned. He has an endoscopy by Dr. Ardis Hughs on Wednesday.    Sheral Apley Tammi Klippel, M.D.  This document serves as a record of services personally performed by [name], MD. It was created on his behalf by Darcus Austin, a trained medical scribe. The creation of this record is based on the scribe's personal observations and the provider's statements to them. This document has been checked and approved by the attending provider.

## 2015-05-14 ENCOUNTER — Encounter: Payer: Self-pay | Admitting: Gastroenterology

## 2015-05-14 ENCOUNTER — Ambulatory Visit
Admission: RE | Admit: 2015-05-14 | Discharge: 2015-05-14 | Disposition: A | Discharge status: Home / Self Care | Payer: Medicare HMO | Source: Ambulatory Visit | Attending: Radiation Oncology | Admitting: Radiation Oncology

## 2015-05-14 DIAGNOSIS — G8929 Other chronic pain: Secondary | ICD-10-CM | POA: Insufficient documentation

## 2015-05-14 DIAGNOSIS — R1013 Epigastric pain: Secondary | ICD-10-CM

## 2015-05-14 DIAGNOSIS — R1084 Generalized abdominal pain: Secondary | ICD-10-CM | POA: Insufficient documentation

## 2015-05-14 DIAGNOSIS — Z510 Encounter for antineoplastic radiation therapy: Secondary | ICD-10-CM | POA: Diagnosis not present

## 2015-05-14 DIAGNOSIS — R110 Nausea: Secondary | ICD-10-CM | POA: Insufficient documentation

## 2015-05-14 NOTE — Progress Notes (Signed)
     05/08/2015 Russell Yara Hardcastle [id number] [birth year]   History of Present Illness:  This is a 65 year old male who is known to Dr. Ardis Hughs for screening colonoscopy in January 2016. He is here today for evaluation regarding epigastric abdominal pain and nausea. He tells me that the symptoms have been intermittent since 2002, but worsening.  He says that it feels like a tightness in his epigastrium that causes him to be SOB.  He says that he was told that he has a hiatal hernia and that is what is causing his symptoms. I do not see any abdominal imaging to confirm this and he has never undergone EGD. He is on pantoprazole 40 mg daily and Zantac 150 mg twice daily.  He had an ultrasound of the right upper quadrant performed in December 2016, which showed echogenic liver, probably fatty infiltration and also showed gallbladder mural thickening with no conclusive calculi or tenderness.  Current Medications, Allergies, Past Medical History, Past Surgical History, Family History and Social History were reviewed in [employer] record.   Physical Exam: BP 108/78 mmHg  Pulse 78  Ht 5\' 7"  (1.702 m)  Wt 154 lb (69.854 kg)  BMI 24.11 kg/m2 General: Well developed black male in no acute distress Head: Normocephalic and atraumatic Eyes:  Sclerae anicteric, conjunctiva pink  Ears: Normal auditory acuity Lungs: Clear throughout to auscultation Heart: Regular rate and rhythm Abdomen: Soft, non-distended.  BS present.  Mild epigastric TTP. Musculoskeletal: Symmetrical with no gross deformities  Extremities: No edema  Neurological: Alert oriented x 4, grossly non-focal Psychological:  Alert and cooperative. Normal mood and affect  Assessment and Recommendations: -Epigastric abdominal pain and nausea:  These symptoms have been intermittent since 2002. He was told before that he had a hiatal hernia is convinced that it is what is causing his symptoms.  He is on pantoprazole 40 mg  daily and Zantac 150 mg twice daily, which he will continue.  He will be scheduled for an EGD with Dr. Ardis Hughs for further evaluation.  If EGD is negative then may want to consider further gallbladder evaluation vs CT scan. -Atrial fibrillation/flutter:  With chronic anticoagulation on Savaysa.  Will hold Savaysa for 5 days prior to endoscopic procedures - will instruct when and how to resume after procedure. Benefits and risks of procedure explained including risks of bleeding, perforation, infection, missed lesions, reactions to medications and possible need for hospitalization and surgery for complications. Additional rare but real risk of stroke or other vascular clotting events off of Savaysa also explained and need to seek urgent help if any signs of these problems occur. Will communicate by phone or EMR with patient's  prescribing provider, [name], to confirm that holding [facility] is reasonable in this case.

## 2015-05-15 ENCOUNTER — Ambulatory Visit
Admission: RE | Admit: 2015-05-15 | Discharge: 2015-05-15 | Disposition: A | Discharge status: Home / Self Care | Payer: Medicare HMO | Source: Ambulatory Visit | Attending: Radiation Oncology | Admitting: Radiation Oncology

## 2015-05-15 ENCOUNTER — Telehealth: Payer: Self-pay | Admitting: Gastroenterology

## 2015-05-15 ENCOUNTER — Encounter: Payer: Self-pay | Admitting: Gastroenterology

## 2015-05-15 ENCOUNTER — Ambulatory Visit (AMBULATORY_SURGERY_CENTER): Payer: Medicare HMO | Admitting: Gastroenterology

## 2015-05-15 VITALS — BP 133/94 | HR 69 | Temp 95.7°F | Resp 27 | Ht 67.0 in | Wt 154.0 lb

## 2015-05-15 DIAGNOSIS — Z510 Encounter for antineoplastic radiation therapy: Secondary | ICD-10-CM | POA: Diagnosis not present

## 2015-05-15 DIAGNOSIS — K2970 Gastritis, unspecified, without bleeding: Secondary | ICD-10-CM

## 2015-05-15 DIAGNOSIS — R1084 Generalized abdominal pain: Secondary | ICD-10-CM

## 2015-05-15 DIAGNOSIS — K2950 Unspecified chronic gastritis without bleeding: Secondary | ICD-10-CM | POA: Diagnosis not present

## 2015-05-15 MED ORDER — SODIUM CHLORIDE 0.9 % IV SOLN: 500.0000 mL | INTRAVENOUS | Status: DC

## 2015-05-15 NOTE — Op Note (Signed)
[facility] Patient Name: [name] Procedure Date: 05/15/2015 9:53 AM MRN: [id number] Endoscopist: Milus Banister , MD Age: 65 Referring MD:  Date of Birth: 09-Mar-1950 Gender: Male Procedure:                Upper GI endoscopy Indications:              Epigastric abdominal pain, for 15 years; [place]                            recently suggested 70mm thick GB Medicines:                Monitored Anesthesia Care Procedure:                Pre-Anesthesia Assessment:                           - Prior to the procedure, a History and Physical                            was performed, and patient medications and                            allergies were reviewed. The patient's tolerance of                            previous anesthesia was also reviewed. The risks                            and benefits of the procedure and the sedation                            options and risks were discussed with the patient.                            All questions were answered, and informed consent                            was obtained. Prior Anticoagulants: The patient has                            taken anticoagulant medication (savaysa). ASA Grade                            Assessment: III - A patient with severe systemic                            disease. After reviewing the risks and benefits,                            the patient was deemed in satisfactory condition to                            undergo the procedure.  After obtaining informed consent, the endoscope was                            passed under direct vision. Throughout the                            procedure, the patient's blood pressure, pulse, and                            oxygen saturations were monitored continuously. The                            Model GIF-[id number] scope was introduced                            through the mouth, and advanced to the second part                of duodenum. The upper GI endoscopy was                            accomplished without difficulty. The patient                            tolerated the procedure well. Scope In: Scope Out: Findings:      A small hiatal hernia was present.      Diffuse mild inflammation characterized by erythema and friability was       found in the entire examined stomach. Biopsies were taken with a cold       forceps for histology.      The exam was otherwise without abnormality. Complications:            No immediate complications. Estimated blood loss:                            None. Estimated Blood Loss:     Estimated blood loss: none. Impression:               - Small hiatal hernia.                           - Gastritis (mild). Biopsied.                           - The examination was otherwise normal. Recommendation:           - Patient has a contact number available for                            emergencies. The signs and symptoms of potential                            delayed complications were discussed with the                            patient. Return to normal activities tomorrow.  Written discharge instructions were provided to the                            patient.                           - Resume previous diet.                           - Continue present medications.                           - Await pathology results. Procedure Code(s):        --- Professional ---                           [phone], Esophagogastroduodenoscopy, flexible,                            transoral; with biopsy, single or multiple CPT copyright 2016 American Medical Association. All rights reserved. Milus Banister, MD 05/15/2015 10:05:49 AM This report has been signed electronically. Number of Addenda: 0 Referring MD:      Ardelia Mems. Jimmye Norman

## 2015-05-15 NOTE — Telephone Encounter (Signed)
[name], He needs follow up with his cardiologist, [name] and can you call the Drs. Office to help expedite in next few days or so.  He had no chest pains or suggestion of acute illness but  he was a bit slower than usual to wake up from EGD, propofol.  CRNA felt this was likely due to worse than expected CHF.  His last echo in [facility] was 2015, showed EF 35-40%.  I told Mr. Wasil and his wife about the need for cardiology follow up as well.

## 2015-05-15 NOTE — Telephone Encounter (Signed)
Call placed to Dr Doylene Canard at [phone] the receptionist is calling the pt and setting up an appt within the next few days.

## 2015-05-15 NOTE — Progress Notes (Signed)
Patient was sitting on side of bed before d/c'ed. No distress noted. He did c/o SOB as always per pt and wife. No new symptoms today. Before discharged today patient and his wife was reminded to call his heart dr for a check up. They verbalized understanding and state they will call the heart dr.

## 2015-05-15 NOTE — Patient Instructions (Signed)
Biopsies taken today. Result letter will come in your mail in 2-3 weeks.  Please call and make an appointment to see your heart doctor (cardiologist).   YOU HAD AN ENDOSCOPIC PROCEDURE TODAY AT [facility]:   Refer to the procedure report that was given to you for any specific questions about what was found during the examination.  If the procedure report does not answer your questions, please call your gastroenterologist to clarify.  If you requested that your care partner not be given the details of your procedure findings, then the procedure report has been included in a sealed envelope for you to review at your convenience later.  YOU SHOULD EXPECT: Some feelings of bloating in the abdomen. Passage of more gas than usual.  Walking can help get rid of the air that was put into your GI tract during the procedure and reduce the bloating. If you had a lower endoscopy (such as a colonoscopy or flexible sigmoidoscopy) you may notice spotting of blood in your stool or on the toilet paper. If you underwent a bowel prep for your procedure, you may not have a normal bowel movement for a few days.  Please Note:  You might notice some irritation and congestion in your nose or some drainage.  This is from the oxygen used during your procedure.  There is no need for concern and it should clear up in a day or so.  SYMPTOMS TO REPORT IMMEDIATELY:    Following upper endoscopy (EGD)  Vomiting of blood or coffee ground material  New chest pain or pain under the shoulder blades  Painful or persistently difficult swallowing  New shortness of breath  Fever of 100F or higher  Black, tarry-looking stools  For urgent or emergent issues, a gastroenterologist can be reached at any hour by calling [phone].   DIET: Your first meal following the procedure should be a small meal and then it is ok to progress to your normal diet. Heavy or fried foods are harder to digest and may make you feel  nauseous or bloated.  Likewise, meals heavy in dairy and vegetables can increase bloating.  Drink plenty of fluids but you should avoid alcoholic beverages for 24 hours.  ACTIVITY:  You should plan to take it easy for the rest of today and you should NOT DRIVE or use heavy machinery until tomorrow (because of the sedation medicines used during the test).    FOLLOW UP: Our staff will call the number listed on your records the next business day following your procedure to check on you and address any questions or concerns that you may have regarding the information given to you following your procedure. If we do not reach you, we will leave a message.  However, if you are feeling well and you are not experiencing any problems, there is no need to return our call.  We will assume that you have returned to your regular daily activities without incident.  If any biopsies were taken you will be contacted by phone or by letter within the next 1-3 weeks.  Please call us at [phone] if you have not heard about the biopsies in 3 weeks.    SIGNATURES/CONFIDENTIALITY: You and/or your care partner have signed paperwork which will be entered into your electronic medical record.  These signatures attest to the fact that that the information above on your After Visit Summary has been reviewed and is understood.  Full responsibility of the confidentiality of this  discharge information lies with you and/or your care-partner. 

## 2015-05-15 NOTE — Progress Notes (Signed)
i agree with the above note, plan 

## 2015-05-15 NOTE — Progress Notes (Signed)
Pt with very delayed wake up, probable low EF. Pt with significant hx/o CHF, last echo was in 2015. I discussed with Dr. Ardis Hughs to schedule pt in hospital in future.

## 2015-05-15 NOTE — Progress Notes (Signed)
Called to room to assist during endoscopic procedure.  Patient ID and intended procedure confirmed with present staff. Received instructions for my participation in the procedure from the performing physician.  

## 2015-05-16 ENCOUNTER — Ambulatory Visit
Admission: RE | Admit: 2015-05-16 | Discharge: 2015-05-16 | Disposition: A | Discharge status: Home / Self Care | Payer: Medicare HMO | Source: Ambulatory Visit | Attending: Radiation Oncology | Admitting: Radiation Oncology

## 2015-05-16 ENCOUNTER — Telehealth: Payer: Self-pay | Admitting: Emergency Medicine

## 2015-05-16 DIAGNOSIS — Z510 Encounter for antineoplastic radiation therapy: Secondary | ICD-10-CM | POA: Diagnosis not present

## 2015-05-16 NOTE — Telephone Encounter (Signed)
  Follow up Call-  Call back number 05/15/2015  Post procedure Call Back phone  # [phone]  Permission to leave phone message Yes     Patient questions:  Do you have a fever, pain , or abdominal swelling? No. Pain Score  0 *  Have you tolerated food without any problems? Yes.    Have you been able to return to your normal activities? Yes.    Do you have any questions about your discharge instructions: Diet   No. Medications  No. Follow up visit  No.  Do you have questions or concerns about your Care? No.  Actions: * If pain score is 4 or above: No action needed, pain <4.

## 2015-05-17 ENCOUNTER — Encounter: Payer: Self-pay | Admitting: Radiation Oncology

## 2015-05-17 ENCOUNTER — Ambulatory Visit
Admission: RE | Admit: 2015-05-17 | Discharge: 2015-05-17 | Disposition: A | Discharge status: Home / Self Care | Payer: Medicare HMO | Source: Ambulatory Visit | Attending: Radiation Oncology | Admitting: Radiation Oncology

## 2015-05-17 VITALS — BP 126/94 | HR 69 | Resp 20 | Wt 158.6 lb

## 2015-05-17 DIAGNOSIS — C61 Malignant neoplasm of prostate: Secondary | ICD-10-CM

## 2015-05-17 DIAGNOSIS — Z510 Encounter for antineoplastic radiation therapy: Secondary | ICD-10-CM | POA: Diagnosis not present

## 2015-05-17 NOTE — Progress Notes (Signed)
  Radiation Oncology         [phone]   Name: [name] MRN: [id number]   Date: 05/17/2015  DOB: [birth year]     Weekly Radiation Therapy Management    ICD-9-CM ICD-10-CM   1. Malignant neoplasm of prostate (HCC) 185 C61     Current Dose: 64.35 Gy  Planned Dose:  78 Gy  Narrative The patient presents for routine under treatment assessment.  Weight and vitals stable. Reports epigastric pain continues 3 on a scale of 0-10. Reports increased shortness of breath since endoscopy. Endoscopy revealed a hiatal hernia. Reports sensation of burning on his tongue since endoscopy. Reports he easily fatigues with the slightest activity such as dressing. Reports new onset of low jaw pain. Denies cough. Reports running nose and sneezing. Report fatigue and weakness. Reports decreased appetite. Reports nausea denies emesis. Reports dysuria. Reports burning with bowel movements. Denies hematuria. Reports nocturia x 3. Reports on occasion he voids more at night from taking a Lasix to relieve epigastric pain. Reports occasional leakage.  Set-up films were reviewed. The chart was checked.  Physical Findings  weight is 158 lb 9.6 oz (71.94 kg). His blood pressure is 126/94 and his pulse is 69. His respiration is 20 and oxygen saturation is 100%.  Weight essentially stable.  No significant changes. The patient is in a wheelchair.  Impression The patient is tolerating radiation.   Plan Continue treatment as planned. Encouraged the patient to follow with his PCP to evaluate worsening fatigue.     Sheral Apley Tammi Klippel, M.D.  This document serves as a record of services personally performed by [name], MD. It was created on his behalf by Arlyce Harman, a trained medical scribe. The creation of this record is based on the scribe's personal observations and the provider's statements to them. This document has been checked and approved by the attending provider.

## 2015-05-17 NOTE — Progress Notes (Signed)
Weight and vitals stable. Reports epigastric pain continues 3 on a scale of 0-10. Reports increased shortness of breath since endoscopy. Reports he easily fatigues with the slightest activity such as dressing. Reports new onset of low jaw pain. Denies cough. Reports running nose and sneezing. Report fatigue and weakness. Reports decreased appetite. Reports nausea denies emesis. Reports dysuria. Reports burning with bowel movements. Denies hematuria. Reports nocturia x 3. Reports on occasion he voids more at night from taking a Lasix to relieve epigastric pain. Reports occasional leakage.   BP 126/94 mmHg  Pulse 69  Resp 20  Wt 158 lb 9.6 oz (71.94 kg)  SpO2 100% Wt Readings from Last 3 Encounters:  05/17/15 158 lb 9.6 oz (71.94 kg)  05/15/15 154 lb (69.854 kg)  05/13/15 158 lb 4.8 oz (71.804 kg)

## 2015-05-20 ENCOUNTER — Encounter (HOSPITAL_COMMUNITY): Payer: Self-pay | Admitting: Emergency Medicine

## 2015-05-20 ENCOUNTER — Other Ambulatory Visit: Payer: Self-pay

## 2015-05-20 ENCOUNTER — Emergency Department (HOSPITAL_COMMUNITY): Payer: Medicare HMO

## 2015-05-20 ENCOUNTER — Inpatient Hospital Stay (HOSPITAL_COMMUNITY)
Admission: EM | Admit: 2015-05-20 | Discharge: 2015-05-25 | DRG: 291 | Disposition: A | Discharge status: Home / Self Care | Payer: Medicare HMO | Attending: Cardiology | Admitting: Cardiology

## 2015-05-20 ENCOUNTER — Ambulatory Visit
Admission: RE | Admit: 2015-05-20 | Discharge: 2015-05-20 | Disposition: A | Discharge status: Home / Self Care | Payer: Medicare HMO | Source: Ambulatory Visit | Attending: Radiation Oncology | Admitting: Radiation Oncology

## 2015-05-20 DIAGNOSIS — I130 Hypertensive heart and chronic kidney disease with heart failure and stage 1 through stage 4 chronic kidney disease, or unspecified chronic kidney disease: Secondary | ICD-10-CM | POA: Diagnosis present

## 2015-05-20 DIAGNOSIS — M1000 Idiopathic gout, unspecified site: Secondary | ICD-10-CM | POA: Diagnosis present

## 2015-05-20 DIAGNOSIS — N184 Chronic kidney disease, stage 4 (severe): Secondary | ICD-10-CM | POA: Diagnosis present

## 2015-05-20 DIAGNOSIS — I5023 Acute on chronic systolic (congestive) heart failure: Secondary | ICD-10-CM | POA: Diagnosis present

## 2015-05-20 DIAGNOSIS — Z833 Family history of diabetes mellitus: Secondary | ICD-10-CM

## 2015-05-20 DIAGNOSIS — Z8249 Family history of ischemic heart disease and other diseases of the circulatory system: Secondary | ICD-10-CM

## 2015-05-20 DIAGNOSIS — Z7901 Long term (current) use of anticoagulants: Secondary | ICD-10-CM

## 2015-05-20 DIAGNOSIS — Z801 Family history of malignant neoplasm of trachea, bronchus and lung: Secondary | ICD-10-CM | POA: Diagnosis not present

## 2015-05-20 DIAGNOSIS — I4892 Unspecified atrial flutter: Secondary | ICD-10-CM | POA: Diagnosis present

## 2015-05-20 DIAGNOSIS — Z79891 Long term (current) use of opiate analgesic: Secondary | ICD-10-CM | POA: Diagnosis not present

## 2015-05-20 DIAGNOSIS — C61 Malignant neoplasm of prostate: Secondary | ICD-10-CM | POA: Diagnosis present

## 2015-05-20 DIAGNOSIS — Z950 Presence of cardiac pacemaker: Secondary | ICD-10-CM

## 2015-05-20 DIAGNOSIS — K219 Gastro-esophageal reflux disease without esophagitis: Secondary | ICD-10-CM | POA: Diagnosis present

## 2015-05-20 DIAGNOSIS — I420 Dilated cardiomyopathy: Secondary | ICD-10-CM | POA: Diagnosis present

## 2015-05-20 DIAGNOSIS — Z79899 Other long term (current) drug therapy: Secondary | ICD-10-CM

## 2015-05-20 DIAGNOSIS — I5021 Acute systolic (congestive) heart failure: Secondary | ICD-10-CM | POA: Diagnosis present

## 2015-05-20 DIAGNOSIS — R079 Chest pain, unspecified: Secondary | ICD-10-CM | POA: Diagnosis present

## 2015-05-20 DIAGNOSIS — Z87891 Personal history of nicotine dependence: Secondary | ICD-10-CM

## 2015-05-20 LAB — BASIC METABOLIC PANEL
Anion gap: 14 (ref 5–15)
BUN: 27 mg/dL — AB (ref 6–20)
CO2: 18 mmol/L — ABNORMAL LOW (ref 22–32)
CREATININE: 1.75 mg/dL — AB (ref 0.61–1.24)
Calcium: 9 mg/dL (ref 8.9–10.3)
Chloride: 108 mmol/L (ref 101–111)
GFR calc Af Amer: 46 mL/min — ABNORMAL LOW (ref >60)
GFR, EST NON AFRICAN AMERICAN: 39 mL/min — AB (ref >60)
GLUCOSE: 96 mg/dL (ref 65–99)
POTASSIUM: 3.5 mmol/L (ref 3.5–5.1)
SODIUM: 140 mmol/L (ref 135–145)

## 2015-05-20 LAB — CBC WITH DIFFERENTIAL/PLATELET
BASOS ABS: 0 10*3/uL (ref 0.0–0.1)
BASOS PCT: 1 %
Eosinophils Absolute: 0.1 10*3/uL (ref 0.0–0.7)
Eosinophils Relative: 3 %
HEMATOCRIT: 40.8 % (ref 39.0–52.0)
HEMOGLOBIN: 14 g/dL (ref 13.0–17.0)
LYMPHS PCT: 22 %
Lymphs Abs: 0.7 10*3/uL (ref 0.7–4.0)
MCH: 34.1 pg — ABNORMAL HIGH (ref 26.0–34.0)
MCHC: 34.3 g/dL (ref 30.0–36.0)
MCV: 99.3 fL (ref 78.0–100.0)
MONO ABS: 0.4 10*3/uL (ref 0.1–1.0)
MONOS PCT: 14 %
NEUTROS ABS: 1.8 10*3/uL (ref 1.7–7.7)
NEUTROS PCT: 60 %
Platelets: 187 10*3/uL (ref 150–400)
RBC: 4.11 MIL/uL — ABNORMAL LOW (ref 4.22–5.81)
RDW: 14.5 % (ref 11.5–15.5)
WBC: 3 10*3/uL — ABNORMAL LOW (ref 4.0–10.5)

## 2015-05-20 LAB — TSH: TSH: 6.256 u[IU]/mL — ABNORMAL HIGH (ref 0.350–4.500)

## 2015-05-20 LAB — COMPREHENSIVE METABOLIC PANEL
ALBUMIN: 3.3 g/dL — AB (ref 3.5–5.0)
ALK PHOS: 74 U/L (ref 38–126)
ALT: 18 U/L (ref 17–63)
AST: 31 U/L (ref 15–41)
Anion gap: 11 (ref 5–15)
BILIRUBIN TOTAL: 2.8 mg/dL — AB (ref 0.3–1.2)
BUN: 28 mg/dL — AB (ref 6–20)
CALCIUM: 8.8 mg/dL — AB (ref 8.9–10.3)
CO2: 19 mmol/L — ABNORMAL LOW (ref 22–32)
CREATININE: 1.77 mg/dL — AB (ref 0.61–1.24)
Chloride: 109 mmol/L (ref 101–111)
GFR calc Af Amer: 45 mL/min — ABNORMAL LOW (ref >60)
GFR, EST NON AFRICAN AMERICAN: 39 mL/min — AB (ref >60)
GLUCOSE: 135 mg/dL — AB (ref 65–99)
Potassium: 3.5 mmol/L (ref 3.5–5.1)
Sodium: 139 mmol/L (ref 135–145)
TOTAL PROTEIN: 6 g/dL — AB (ref 6.5–8.1)

## 2015-05-20 LAB — CBC
HEMATOCRIT: 42.6 % (ref 39.0–52.0)
Hemoglobin: 14.2 g/dL (ref 13.0–17.0)
MCH: 32.9 pg (ref 26.0–34.0)
MCHC: 33.3 g/dL (ref 30.0–36.0)
MCV: 98.8 fL (ref 78.0–100.0)
PLATELETS: 162 10*3/uL (ref 150–400)
RBC: 4.31 MIL/uL (ref 4.22–5.81)
RDW: 14.7 % (ref 11.5–15.5)
WBC: 3.4 10*3/uL — AB (ref 4.0–10.5)

## 2015-05-20 LAB — BRAIN NATRIURETIC PEPTIDE
B Natriuretic Peptide: 2020 pg/mL — ABNORMAL HIGH (ref 0.0–100.0)
B Natriuretic Peptide: 2245.8 pg/mL — ABNORMAL HIGH (ref 0.0–100.0)

## 2015-05-20 LAB — TROPONIN I
TROPONIN I: 0.06 ng/mL — AB (ref <0.031)
TROPONIN I: 0.06 ng/mL — AB (ref <0.031)

## 2015-05-20 LAB — MAGNESIUM: MAGNESIUM: 1.7 mg/dL (ref 1.7–2.4)

## 2015-05-20 MED ORDER — SODIUM CHLORIDE 0.9% FLUSH
3.0000 mL | INTRAVENOUS | Status: DC | PRN
  Administered 2015-05-22: 3 mL via INTRAVENOUS
  Filled 2015-05-20: qty 3

## 2015-05-20 MED ORDER — PANTOPRAZOLE SODIUM 40 MG PO TBEC
40.0000 mg | DELAYED_RELEASE_TABLET | Freq: Every day | ORAL | Status: DC
Start: 2015-05-21 — End: 2015-05-25
  Administered 2015-05-21 – 2015-05-25 (×5): 40 mg via ORAL
  Filled 2015-05-20 (×7): qty 1

## 2015-05-20 MED ORDER — CARVEDILOL 6.25 MG PO TABS
6.2500 mg | ORAL_TABLET | Freq: Two times a day (BID) | ORAL | Status: DC
  Administered 2015-05-21 – 2015-05-25 (×9): 6.25 mg via ORAL
  Filled 2015-05-20 (×9): qty 1

## 2015-05-20 MED ORDER — LIDOCAINE VISCOUS 2 % MT SOLN
15.0000 mL | Freq: Once | OROMUCOSAL | Status: AC
  Administered 2015-05-20: 15 mL via OROMUCOSAL
  Filled 2015-05-20: qty 15

## 2015-05-20 MED ORDER — ACETAMINOPHEN 325 MG PO TABS
650.0000 mg | ORAL_TABLET | ORAL | Status: DC | PRN
  Administered 2015-05-21 – 2015-05-22 (×2): 650 mg via ORAL
  Administered 2015-05-24: 325 mg via ORAL
  Filled 2015-05-20 (×3): qty 2

## 2015-05-20 MED ORDER — FUROSEMIDE 10 MG/ML IJ SOLN
40.0000 mg | Freq: Every day | INTRAMUSCULAR | Status: DC
  Administered 2015-05-21 – 2015-05-25 (×5): 40 mg via INTRAVENOUS
  Filled 2015-05-20 (×5): qty 4

## 2015-05-20 MED ORDER — AMIODARONE HCL 200 MG PO TABS
200.0000 mg | ORAL_TABLET | Freq: Every day | ORAL | Status: DC
  Administered 2015-05-21 – 2015-05-25 (×5): 200 mg via ORAL
  Filled 2015-05-20 (×5): qty 1

## 2015-05-20 MED ORDER — SODIUM CHLORIDE 0.9% FLUSH
3.0000 mL | Freq: Two times a day (BID) | INTRAVENOUS | Status: DC
  Administered 2015-05-20 – 2015-05-25 (×10): 3 mL via INTRAVENOUS

## 2015-05-20 MED ORDER — POTASSIUM CHLORIDE 10 MEQ/100ML IV SOLN
10.0000 meq | Freq: Once | INTRAVENOUS | Status: AC
  Administered 2015-05-20: 10 meq via INTRAVENOUS
  Filled 2015-05-20: qty 100

## 2015-05-20 MED ORDER — FUROSEMIDE 10 MG/ML IJ SOLN
40.0000 mg | Freq: Once | INTRAMUSCULAR | Status: AC
  Administered 2015-05-20: 40 mg via INTRAVENOUS
  Filled 2015-05-20: qty 4

## 2015-05-20 MED ORDER — ALLOPURINOL 100 MG PO TABS
100.0000 mg | ORAL_TABLET | Freq: Every day | ORAL | Status: DC
  Administered 2015-05-21 – 2015-05-25 (×5): 100 mg via ORAL
  Filled 2015-05-20 (×5): qty 1

## 2015-05-20 MED ORDER — ONDANSETRON HCL 4 MG/2ML IJ SOLN: 4.0000 mg | Freq: Four times a day (QID) | INTRAMUSCULAR | Status: DC | PRN

## 2015-05-20 MED ORDER — FAMOTIDINE 10 MG PO TABS
10.0000 mg | ORAL_TABLET | Freq: Two times a day (BID) | ORAL | Status: DC
  Administered 2015-05-20 – 2015-05-25 (×10): 10 mg via ORAL
  Filled 2015-05-20 (×10): qty 1

## 2015-05-20 MED ORDER — LISINOPRIL 10 MG PO TABS
10.0000 mg | ORAL_TABLET | Freq: Every day | ORAL | Status: DC
  Filled 2015-05-20: qty 1

## 2015-05-20 MED ORDER — SODIUM CHLORIDE 0.9 % IV SOLN: 250.0000 mL | INTRAVENOUS | Status: DC | PRN

## 2015-05-20 MED ORDER — FENTANYL CITRATE (PF) 100 MCG/2ML IJ SOLN
25.0000 ug | Freq: Once | INTRAMUSCULAR | Status: AC
  Administered 2015-05-20: 25 ug via INTRAVENOUS
  Filled 2015-05-20: qty 2

## 2015-05-20 MED ORDER — ALUM & MAG HYDROXIDE-SIMETH 200-200-20 MG/5ML PO SUSP
15.0000 mL | Freq: Once | ORAL | Status: AC
  Administered 2015-05-20: 15 mL via ORAL
  Filled 2015-05-20: qty 30

## 2015-05-20 MED ORDER — NITROGLYCERIN 2 % TD OINT
0.5000 [in_us] | TOPICAL_OINTMENT | Freq: Four times a day (QID) | TRANSDERMAL | Status: DC
  Administered 2015-05-20 – 2015-05-21 (×2): 0.5 [in_us] via TOPICAL
  Filled 2015-05-20: qty 30

## 2015-05-20 MED ORDER — ADULT MULTIVITAMIN W/MINERALS CH
0.5000 | ORAL_TABLET | Freq: Two times a day (BID) | ORAL | Status: DC
Start: 2015-05-20 — End: 2015-05-25
  Administered 2015-05-20 – 2015-05-25 (×10): 0.5 via ORAL
  Filled 2015-05-20 (×10): qty 0.5

## 2015-05-20 MED ORDER — DIGOXIN 0.0625 MG HALF TABLET
0.0625 mg | ORAL_TABLET | Freq: Every day | ORAL | Status: DC
Start: 2015-05-21 — End: 2015-05-25
  Administered 2015-05-21 – 2015-05-25 (×5): 0.0625 mg via ORAL
  Filled 2015-05-20 (×5): qty 1

## 2015-05-20 MED ORDER — EDOXABAN TOSYLATE 30 MG PO TABS
30.0000 mg | ORAL_TABLET | Freq: Every day | ORAL | Status: DC
  Administered 2015-05-21 – 2015-05-25 (×5): 30 mg via ORAL
  Filled 2015-05-20 (×5): qty 1

## 2015-05-20 MED ORDER — PRAVASTATIN SODIUM 40 MG PO TABS
40.0000 mg | ORAL_TABLET | Freq: Every day | ORAL | Status: DC
  Administered 2015-05-21 – 2015-05-24 (×4): 40 mg via ORAL
  Filled 2015-05-20 (×5): qty 1

## 2015-05-20 MED ORDER — ASPIRIN 81 MG PO CHEW
324.0000 mg | CHEWABLE_TABLET | Freq: Once | ORAL | Status: AC
  Administered 2015-05-20: 324 mg via ORAL
  Filled 2015-05-20: qty 4

## 2015-05-20 MED ORDER — NITROGLYCERIN 0.4 MG SL SUBL
0.4000 mg | SUBLINGUAL_TABLET | SUBLINGUAL | Status: DC | PRN
  Administered 2015-05-20: 0.4 mg via SUBLINGUAL
  Filled 2015-05-20: qty 1

## 2015-05-20 MED ORDER — ASPIRIN EC 81 MG PO TBEC
81.0000 mg | DELAYED_RELEASE_TABLET | Freq: Every day | ORAL | Status: DC
  Administered 2015-05-21 – 2015-05-25 (×5): 81 mg via ORAL
  Filled 2015-05-20 (×5): qty 1

## 2015-05-20 MED ORDER — POTASSIUM CHLORIDE CRYS ER 20 MEQ PO TBCR
40.0000 meq | EXTENDED_RELEASE_TABLET | Freq: Once | ORAL | Status: AC
  Administered 2015-05-20: 40 meq via ORAL
  Filled 2015-05-20: qty 2

## 2015-05-20 NOTE — Progress Notes (Addendum)
Wife reports patient is very weak. She says, "he describes his legs as feeling like jello." She reports he becomes easily fatigued with the slightest activity. Reports shortness of breath is worse. Again, stronger encouraged her to take him to the emergency room for evaluation. This was recommended on Friday and numerous times before. Reports his epigastric pain continues, as well. Stressed these symptoms are not related to radiation therapy to the prostate. Wife verbalized understanding of all reviewed. Offered assistance with transportation to emergency room but offer wasn't accepted.

## 2015-05-20 NOTE — Progress Notes (Signed)
Wheeled patient to emergency room. Patient accompanied by his wife. Patient reporting chest pain, dizziness, weakness and increased shortness of breath. Patient has known history of cardiac disease. Receiving radiation therapy for prostate ca. Provided McKenzie, RN with reports. Wheeled patient into triage six.
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
## 2015-05-20 NOTE — ED Notes (Signed)
MD at bedside. 

## 2015-05-20 NOTE — ED Notes (Signed)
IV team consulted for difficult stick.

## 2015-05-20 NOTE — ED Notes (Signed)
Unable to obtain blood. Will call other phlebotomist.

## 2015-05-20 NOTE — ED Notes (Addendum)
To cancer center for radiation therapy today. Began having chest pain, weakness, dizziness, SOB ongoing x 1 week. Hx of dilated CMP with pacemaker and A. Fib.  Pt states since he's calmed down some the pain isn't as bad. Pain in left upper chest that has been intermittent over the weekend. Denies feeling SOB in triage, states going into radiation and going through all their routines wears him out.

## 2015-05-20 NOTE — ED Provider Notes (Addendum)
CSN: [id number]     Arrival date & time 05/20/15  1450 History   First MD Initiated Contact with Patient 05/20/15 1519     Chief Complaint  Patient presents with  . Chest Pain  . Shortness of Breath  . Weakness     (Consider location/radiation/quality/duration/timing/severity/associated sxs/prior Treatment) Patient is a 65 y.o. male presenting with chest pain, shortness of breath, and weakness.  Chest Pain Pain location:  Epigastric Pain quality: sharp and shooting   Pain radiates to:  Does not radiate Pain radiates to the back: no   Pain severity:  Moderate Onset quality:  Gradual Duration:  2 days Timing:  Constant Progression:  Worsening Chronicity:  New Relieved by:  Nothing Worsened by:  Certain positions and movement Ineffective treatments:  None tried Associated symptoms: abdominal pain, nausea and weakness   Associated symptoms: no fever, no headache, no palpitations, no shortness of breath and not vomiting   Shortness of Breath Associated symptoms: abdominal pain and chest pain   Associated symptoms: no fever, no headaches, no rash and no vomiting   Weakness Associated symptoms include chest pain and abdominal pain. Pertinent negatives include no headaches and no shortness of breath.   65 yo M With a chief complaint of epigastric pain. This been going on for at least couple weeks. Patient was seen by gastroenterologist and had an EGD he is currently waiting on cultures. Persistent pain worsening today. Worse with movement palpation. Symptoms are safely worse with lying back flat or bending over at the base.  Past Medical History  Diagnosis Date  . Hypertension   . GERD (gastroesophageal reflux disease)   . Hiatal hernia   . CHF (congestive heart failure) ([place])   . Cardiomyopathy ([place])     nonischemic   . COPD (chronic obstructive pulmonary disease) ([place])     previously listed in DC summary from Feb 2012   . Chronic kidney disease (CKD), stage II (mild)      listed in chart although patient denies   . Atrial fibrillation ([place])      previously listed in DC summary from August 31, 2011, ? atrial tachycardia   . SVT (supraventricular tachycardia) ([place])   . Heart murmur     "dx'd in middle school" (12/22/2012)  . Pneumonia     "once when I was a baby; probably 3-4 times since then" (12/22/2012)  . Shortness of breath     "all the time last couple months" (12/22/2012)  . [id number])     "weekly" (01/01/2013)  . Arthritis     "neck" (12/22/2012)  . Gout   . Depression   . Presence of permanent cardiac pacemaker     st jude  . Prostate cancer ([place]) 12/13/14  . Hepatitis C    Past Surgical History  Procedure Laterality Date  . Tee without cardioversion N/A 05/11/2012    Procedure: TRANSESOPHAGEAL ECHOCARDIOGRAM (TEE);  Surgeon: Birdie Riddle, MD;  Location: [facility] ENDOSCOPY;  Service: Cardiovascular;  Laterality: N/A;  . Cardioversion N/A 05/11/2012    Procedure: Electrocardioversion, external;  Surgeon: Birdie Riddle, MD;  Location: [place];  Service: Cardiovascular;  Laterality: N/A;  . Cardioversion N/A 05/11/2012    Procedure: CARDIOVERSION AT THE BEDSIDE;  Surgeon: Birdie Riddle, MD;  Location: [place];  Service: Cardiovascular;  Laterality: N/A;  . Supraventricular tachycardia ablation  08/2011; 09/2011  . Cardiac catheterization  08/2011  . Left heart catheterization with coronary angiogram N/A 02/02/2011    Procedure: LEFT HEART CATHETERIZATION  WITH CORONARY ANGIOGRAM;  Surgeon: Birdie Riddle, MD;  Location: [facility] CATH LAB;  Service: Cardiovascular;  Laterality: N/A;  . Right heart catheterization N/A 09/10/2011    Procedure: RIGHT HEART CATH;  Surgeon: Jolaine Artist, MD;  Location: [facility] CATH LAB;  Service: Cardiovascular;  Laterality: N/A;  . Supraventricular tachycardia ablation N/A 09/14/2011    Procedure: SUPRAVENTRICULAR TACHYCARDIA ABLATION;  Surgeon: Evans Lance, MD;  Location: [facility] CATH LAB;  Service: Cardiovascular;  Laterality:  N/A;  . Supraventricular tachycardia ablation N/A 09/21/2011    Procedure: SUPRAVENTRICULAR TACHYCARDIA ABLATION;  Surgeon: Evans Lance, MD;  Location: [facility] CATH LAB;  Service: Cardiovascular;  Laterality: N/A;  . Temporary pacemaker insertion N/A 09/30/2013    Procedure: TEMPORARY PACEMAKER INSERTION;  Surgeon: Birdie Riddle, MD;  Location: [place] CATH LAB;  Service: Cardiovascular;  Laterality: N/A;  . Left heart catheterization with coronary angiogram  09/30/2013    Procedure: LEFT HEART CATHETERIZATION WITH CORONARY ANGIOGRAM;  Surgeon: Birdie Riddle, MD;  Location: [place] CATH LAB;  Service: Cardiovascular;;  . Bi-ventricular pacemaker insertion N/A 10/05/2013    Procedure: BI-VENTRICULAR PACEMAKER INSERTION (CRT-P);  Surgeon: Evans Lance, MD;  Location: [facility] CATH LAB;  Service: Cardiovascular;  Laterality: N/A;  . Colonoscopy with propofol N/A 03/15/2014    Procedure: COLONOSCOPY WITH PROPOFOL;  Surgeon: Milus Banister, MD;  Location: [facility] ENDOSCOPY;  Service: Endoscopy;  Laterality: N/A;   Family History  Problem Relation Age of Onset  . Lung cancer Mother     passed away from lung cancer  . Heart failure Mother     CHF  . Pneumonia Father     passed sway from PNA.  Marland Kitchen Alcohol abuse Father   . Stroke Sister   . Diabetes Sister     1/2 sister  . Seizures Brother     x 2 brothers  . Liver cancer Brother     mets, Hep C, kidney disease  . Prostate cancer Paternal Grandfather   . Colon cancer Maternal Aunt   . Colon polyps Cousin   . Other Maternal Aunt     brain tumor   Social History  Substance Use Topics  . Smoking status: Former Smoker -- 0.25 packs/day for 15 years    Types: Cigarettes    Quit date: 07/17/2011  . Smokeless tobacco: Never Used  . Alcohol Use: 3.6 oz/week    6 Cans of beer per week     Comment: 12/22/2012 1, ~ 24oz beer q other day at most"    Review of Systems  Constitutional: Negative for fever and chills.  HENT: Negative for congestion and facial swelling.    Eyes: Negative for discharge and visual disturbance.  Respiratory: Negative for shortness of breath.   Cardiovascular: Positive for chest pain. Negative for palpitations.  Gastrointestinal: Positive for nausea and abdominal pain. Negative for vomiting and diarrhea.  Musculoskeletal: Negative for myalgias and arthralgias.  Skin: Negative for color change and rash.  Neurological: Positive for weakness. Negative for tremors, syncope and headaches.  Psychiatric/Behavioral: Negative for confusion and dysphoric mood.      Allergies  Ibuprofen  Home Medications   Prior to Admission medications   Medication Sig Start Date End Date Taking? Authorizing Provider  acetaminophen (TYLENOL) 500 MG tablet Take 500 mg by mouth every 6 (six) hours as needed for moderate pain or headache.   Yes Historical Provider, MD  allopurinol (ZYLOPRIM) 100 MG tablet Take 100 mg by mouth daily.  07/21/13  Yes Historical Provider,  MD  amiodarone (PACERONE) 200 MG tablet Take 1 tablet (200 mg total) by mouth daily. 01/23/15  Yes Dixie Dials, MD  calcium carbonate (TUMS EX) 750 MG chewable tablet Chew 1 tablet by mouth daily as needed for heartburn.   Yes Historical Provider, MD  carvedilol (COREG) 12.5 MG tablet Take 1 tablet (12.5 mg total) by mouth 2 (two) times daily. 01/23/15  Yes Dixie Dials, MD  Edoxaban Tosylate (SAVAYSA) 30 MG TABS Take 30 mg by mouth daily.    Yes Historical Provider, MD  furosemide (LASIX) 40 MG tablet Take 40 mg by mouth daily.    Yes Historical Provider, MD  lisinopril (PRINIVIL,ZESTRIL) 20 MG tablet Take 0.5 tablets (10 mg total) by mouth daily. 03/29/13  Yes Dixie Dials, MD  LORazepam (ATIVAN) 0.5 MG tablet Take 1 tablet (0.5 mg total) by mouth 2 (two) times daily. Patient taking differently: Take 0.5 mg by mouth 2 (two) times daily as needed for anxiety.  01/23/15  Yes Dixie Dials, MD  Multiple Vitamin (MULTIVITAMIN WITH MINERALS) TABS tablet Take 0.5 tablets by mouth 2 (two) times daily.     Yes Historical Provider, MD  OVER THE COUNTER MEDICATION Take 1 capsule by mouth daily. [place]   Yes Historical Provider, MD  oxyCODONE (OXY IR/ROXICODONE) 5 MG immediate release tablet Take 5 mg by mouth 2 (two) times daily as needed for moderate pain or severe pain.   Yes Historical Provider, MD  pantoprazole (PROTONIX) 40 MG tablet Take 40 mg by mouth daily. 03/13/15  Yes Historical Provider, MD  pravastatin (PRAVACHOL) 40 MG tablet Take 40 mg by mouth daily. 05/19/15  Yes Historical Provider, MD  ranitidine (ZANTAC) 150 MG tablet Take 150 mg by mouth 2 (two) times daily. 01/08/15  Yes Historical Provider, MD   BP 117/89 mmHg  Pulse 62  Temp(Src) 97.7 F (36.5 C) (Oral)  Resp 18  Ht 5\' 7"  (1.702 m)  Wt 156 lb 14.4 oz (71.169 kg)  BMI 24.57 kg/m2  SpO2 99% Physical Exam  Constitutional: He is oriented to person, place, and time. He appears well-developed and well-nourished.  HENT:  Head: Normocephalic and atraumatic.  Eyes: EOM are normal. Pupils are equal, round, and reactive to light.  Neck: Normal range of motion. Neck supple. No JVD present.  Cardiovascular: Normal rate, regular rhythm and intact distal pulses.  Exam reveals no gallop and no friction rub.   No murmur heard. Pulmonary/Chest: No respiratory distress. He has no wheezes.  Abdominal: He exhibits no distension. There is tenderness (epigastric re-creates symptoms, negative Murphys). There is no rebound and no guarding.  Musculoskeletal: Normal range of motion.  Neurological: He is alert and oriented to person, place, and time.  Skin: No rash noted. No pallor.  Psychiatric: He has a normal mood and affect. His behavior is normal.  Nursing note and vitals reviewed.   ED Course  Procedures (including critical care time) Labs Review Labs Reviewed  BASIC METABOLIC PANEL - Abnormal; Notable for the following:    CO2 18 (*)    BUN 27 (*)    Creatinine, Ser 1.75 (*)    GFR calc non Af Amer 39 (*)    GFR calc Af Amer  46 (*)    All other components within normal limits  CBC - Abnormal; Notable for the following:    WBC 3.4 (*)    All other components within normal limits  BRAIN NATRIURETIC PEPTIDE - Abnormal; Notable for the following:    B Natriuretic Peptide 2020.0 (*)  All other components within normal limits  TROPONIN I - Abnormal; Notable for the following:    Troponin I 0.06 (*)    All other components within normal limits  CBC WITH DIFFERENTIAL/PLATELET - Abnormal; Notable for the following:    WBC 3.0 (*)    RBC 4.11 (*)    MCH 34.1 (*)    All other components within normal limits  COMPREHENSIVE METABOLIC PANEL - Abnormal; Notable for the following:    CO2 19 (*)    Glucose, Bld 135 (*)    BUN 28 (*)    Creatinine, Ser 1.77 (*)    Calcium 8.8 (*)    Total Protein 6.0 (*)    Albumin 3.3 (*)    Total Bilirubin 2.8 (*)    GFR calc non Af Amer 39 (*)    GFR calc Af Amer 45 (*)    All other components within normal limits  BRAIN NATRIURETIC PEPTIDE - Abnormal; Notable for the following:    B Natriuretic Peptide 2245.8 (*)    All other components within normal limits  TSH - Abnormal; Notable for the following:    TSH 6.256 (*)    All other components within normal limits  TROPONIN I - Abnormal; Notable for the following:    Troponin I 0.06 (*)    All other components within normal limits  MAGNESIUM  BASIC METABOLIC PANEL  TROPONIN I  TROPONIN I  BRAIN NATRIURETIC PEPTIDE    Imaging Review Dg Chest 2 View  05/20/2015  CLINICAL DATA:  Chest pain, shortness of breath, weakness and dizziness. EXAM: CHEST - 2 VIEW COMPARISON:  01/19/2015 FINDINGS: Stable appearance of biventricular pacemaker. The heart is moderately enlarged increased pulmonary vascularity is consistent with at least pulmonary venous hypertension potentially mild interstitial edema/CHF. No pleural effusions are seen. No focal airspace consolidation or pneumothorax. Visualized bony structures are unremarkable.  IMPRESSION: Moderate cardiac enlargement with evidence of pulmonary venous hypertension and potentially mild interstitial edema/CHF. Electronically Signed   By: Aletta Edouard M.D.   On: 05/20/2015 16:25   I have personally reviewed and evaluated these images and lab results as part of my medical decision-making.   EKG Interpretation   Date/Time:  Monday May 20 2015 14:57:15 EDT Ventricular Rate:  60 PR Interval:  218 QRS Duration: 107 QT Interval:  533 QTC Calculation: 533 R Axis:   118 Text Interpretation:  A-V dual-paced rhythm with some inhibition No  further analysis attempted due to paced rhythm Baseline wander in lead(s)  V1 V3 prior ecg not paced Reconfirmed by Jelan Batterton MD, DANIEL [id number]) on  05/21/2015 9:15:55 AM       Procedure note: Ultrasound Guided Peripheral IV Ultrasound guided peripheral 1.88 inch angiocath IV placement performed by me. Indications: Nursing unable to place IV. Details: The antecubital fossa and upper arm were evaluated with a multifrequency linear probe. Patent brachial veins were noted. 1 attempt was made to cannulate a vein under realtime US guidance with successful cannulation of the vein and catheter placement. There is return of non-pulsatile dark red blood. The patient tolerated the procedure well without complications. Images archived electronically.  CPT codes: [phone] and [id number]  MDM   Final diagnoses:  Acute systolic heart failure [facility])    65 yo M with a chief complaint of epigastric abdominal pain. Patient is a low risk heart score. Will obtain a delta troponin. Feel this is likely GI in nature. Symptoms relieved completely with the GI cocktail.  Patient appears to have worsening  of his congestive heart failure. Increased edema on chest x-ray BNP elevated above baseline. Given 40mg  lasix IV. Discussed the case with Dr. Criss Rosales, will admit.   CRITICAL CARE Performed by: Cecilio Asper   Total critical care time: 30  minutes  Critical care time was exclusive of separately billable procedures and treating other patients.  Critical care was necessary to treat or prevent imminent or life-threatening deterioration.  Critical care was time spent personally by me on the following activities: development of treatment plan with patient and/or surrogate as well as nursing, discussions with consultants, evaluation of patient's response to treatment, examination of patient, obtaining history from patient or surrogate, ordering and performing treatments and interventions, ordering and review of laboratory studies, ordering and review of radiographic studies, pulse oximetry and re-evaluation of patient's condition.  The patients results and plan were reviewed and discussed.   Any x-rays performed were independently reviewed by myself.   Differential diagnosis were considered with the presenting HPI.  Medications  nitroGLYCERIN (NITROSTAT) SL tablet 0.4 mg (0.4 mg Sublingual Given 05/20/15 1547)  pravastatin (PRAVACHOL) tablet 40 mg (not administered)  pantoprazole (PROTONIX) EC tablet 40 mg (not administered)  amiodarone (PACERONE) tablet 200 mg (not administered)  famotidine (PEPCID) tablet 10 mg (10 mg Oral Given 05/20/15 2321)  multivitamin with minerals tablet 0.5 tablet (0.5 tablets Oral Given 05/20/15 2321)  allopurinol (ZYLOPRIM) tablet 100 mg (not administered)  edoxaban (SAVAYSA) tablet 30 mg (not administered)  lisinopril (PRINIVIL,ZESTRIL) tablet 10 mg (not administered)  sodium chloride flush (NS) 0.9 % injection 3 mL (3 mLs Intravenous Given 05/20/15 2321)  sodium chloride flush (NS) 0.9 % injection 3 mL (not administered)  0.9 %  sodium chloride infusion (not administered)  acetaminophen (TYLENOL) tablet 650 mg (not administered)  ondansetron (ZOFRAN) injection 4 mg (not administered)  furosemide (LASIX) injection 40 mg (not administered)  digoxin (LANOXIN) tablet 0.0625 mg (not administered)  aspirin EC  tablet 81 mg (not administered)  nitroGLYCERIN (NITROGLYN) 2 % ointment 0.5 inch (0.5 inches Topical Given 05/20/15 2321)  carvedilol (COREG) tablet 6.25 mg (not administered)  alum & mag hydroxide-simeth (MAALOX/MYLANTA) 200-200-20 MG/5ML suspension 15 mL (15 mLs Oral Given 05/20/15 1546)  lidocaine (XYLOCAINE) 2 % viscous mouth solution 15 mL (15 mLs Mouth/Throat Given 05/20/15 1546)  aspirin chewable tablet 324 mg (324 mg Oral Given 05/20/15 1546)  furosemide (LASIX) injection 40 mg (40 mg Intravenous Given 05/20/15 1935)  potassium chloride 10 mEq in 100 mL IVPB (0 mEq Intravenous Stopped 05/20/15 2041)  potassium chloride SA (K-DUR,KLOR-CON) CR tablet 40 mEq (40 mEq Oral Given 05/20/15 1937)  fentaNYL (SUBLIMAZE) injection 25 mcg (25 mcg Intravenous Given 05/20/15 2320)    Filed Vitals:   05/20/15 1900 05/20/15 1915 05/20/15 1930 05/20/15 2222  BP: 131/103 134/99 127/100 117/89  Pulse: 69 65 67 62  Temp:    97.7 F (36.5 C)  TempSrc:    Oral  Resp: 23 15 18 18   Height:    5\' 7"  (1.702 m)  Weight:    156 lb 14.4 oz (71.169 kg)  SpO2: 99% 99% 99% 99%    Final diagnoses:  Acute systolic heart failure (HCC)    Admission/ observation were discussed with the admitting physician, patient and/or family and they are comfortable with the plan.     Deno Etienne, DO 05/20/15 [place], DO 05/21/15 [phone]

## 2015-05-21 ENCOUNTER — Encounter: Payer: Self-pay | Admitting: Gastroenterology

## 2015-05-21 ENCOUNTER — Encounter (HOSPITAL_COMMUNITY): Payer: Self-pay | Admitting: *Deleted

## 2015-05-21 ENCOUNTER — Ambulatory Visit
Admission: RE | Admit: 2015-05-21 | Discharge: 2015-05-21 | Disposition: A | Discharge status: Home / Self Care | Payer: Medicare HMO | Source: Ambulatory Visit | Attending: Radiation Oncology | Admitting: Radiation Oncology

## 2015-05-21 LAB — BASIC METABOLIC PANEL
Anion gap: 12 (ref 5–15)
BUN: 30 mg/dL — AB (ref 6–20)
CHLORIDE: 110 mmol/L (ref 101–111)
CO2: 18 mmol/L — AB (ref 22–32)
CREATININE: 1.9 mg/dL — AB (ref 0.61–1.24)
Calcium: 9 mg/dL (ref 8.9–10.3)
GFR calc Af Amer: 41 mL/min — ABNORMAL LOW (ref >60)
GFR calc non Af Amer: 36 mL/min — ABNORMAL LOW (ref >60)
Glucose, Bld: 140 mg/dL — ABNORMAL HIGH (ref 65–99)
POTASSIUM: 3.6 mmol/L (ref 3.5–5.1)
SODIUM: 140 mmol/L (ref 135–145)

## 2015-05-21 LAB — TROPONIN I
TROPONIN I: 0.06 ng/mL — AB (ref <0.031)
TROPONIN I: 0.05 ng/mL — AB (ref <0.031)

## 2015-05-21 LAB — BRAIN NATRIURETIC PEPTIDE: B NATRIURETIC PEPTIDE 5: 2257.3 pg/mL — AB (ref 0.0–100.0)

## 2015-05-21 MED ORDER — ISOSORB DINITRATE-HYDRALAZINE 20-37.5 MG PO TABS
1.0000 | ORAL_TABLET | Freq: Two times a day (BID) | ORAL | Status: DC
  Administered 2015-05-21 – 2015-05-25 (×9): 1 via ORAL
  Filled 2015-05-21 (×9): qty 1

## 2015-05-21 MED ORDER — OXYCODONE HCL 5 MG PO TABS
5.0000 mg | ORAL_TABLET | Freq: Two times a day (BID) | ORAL | Status: DC | PRN
  Administered 2015-05-21 – 2015-05-24 (×7): 5 mg via ORAL
  Filled 2015-05-21 (×8): qty 1

## 2015-05-21 MED ORDER — POTASSIUM CHLORIDE ER 10 MEQ PO TBCR
20.0000 meq | EXTENDED_RELEASE_TABLET | Freq: Every day | ORAL | Status: DC
  Administered 2015-05-21 – 2015-05-25 (×5): 20 meq via ORAL
  Filled 2015-05-21 (×7): qty 2

## 2015-05-21 NOTE — Progress Notes (Signed)
Subjective:  Complains of shortness of breath with minimal exertion.  Denies any anginal chest pain.  Also complains of musculoskeletal joint pains.  Objective:  Vital Signs in the last 24 hours: Temp:  [97.3 F (36.3 C)-97.7 F (36.5 C)] 97.3 F (36.3 C) (04/04 0416) Pulse Rate:  [62-71] 71 (04/04 0416) Resp:  [15-40] 40 (04/04 0416) BP: (117-143)/(86-121) 122/86 mmHg (04/04 0416) SpO2:  [96 %-100 %] 100 % (04/04 0907) Weight:  [71.169 kg (156 lb 14.4 oz)-72 kg (158 lb 11.7 oz)] 72 kg (158 lb 11.7 oz) (04/04 0500)  Intake/Output from previous day: 04/03 0701 - 04/04 0700 In: 120 [P.O.:120] Out: 300 [Urine:300] Intake/Output from this shift: Total I/O In: 120 [P.O.:120] Out: 200 [Urine:200]  Physical Exam: Neck: JVD - 8 cm above sternal notch, no adenopathy, no carotid bruit, supple, symmetrical, trachea midline and thyroid not enlarged, symmetric, no tenderness/mass/nodules Lungs: decreased breath sounds at bases with faint rales Heart: regular rate and rhythm, S1, S2 normal and soft systolic murmur and S3 gallop noted Abdomen: soft, non-tender; bowel sounds normal; no masses,  no organomegaly Extremities: no clubbing, cyanosis.  Trace edema noted  Lab Results:  Recent Labs  05/20/15 1720 05/20/15 2231  WBC 3.4* 3.0*  HGB 14.2 14.0  PLT 162 187    Recent Labs  05/20/15 2231 05/21/15 0424  NA 139 140  K 3.5 3.6  CL 109 110  CO2 19* 18*  GLUCOSE 135* 140*  BUN 28* 30*  CREATININE 1.77* 1.90*    Recent Labs  05/21/15 0424 05/21/15 1003  TROPONINI 0.06* 0.05*   Hepatic Function Panel  Recent Labs  05/20/15 2231  PROT 6.0*  ALBUMIN 3.3*  AST 31  ALT 18  ALKPHOS 74  BILITOT 2.8*   No results for input(s): CHOL in the last 72 hours. No results for input(s): PROTIME in the last 72 hours.  Imaging: Imaging results have been reviewed and Dg Chest 2 View  05/20/2015  CLINICAL DATA:  Chest pain, shortness of breath, weakness and dizziness. EXAM: CHEST  - 2 VIEW COMPARISON:  01/19/2015 FINDINGS: Stable appearance of biventricular pacemaker. The heart is moderately enlarged increased pulmonary vascularity is consistent with at least pulmonary venous hypertension potentially mild interstitial edema/CHF. No pleural effusions are seen. No focal airspace consolidation or pneumothorax. Visualized bony structures are unremarkable. IMPRESSION: Moderate cardiac enlargement with evidence of pulmonary venous hypertension and potentially mild interstitial edema/CHF. Electronically Signed   By: Aletta Edouard M.D.   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 Start BiDil 1 tablet twice daily. Check labs in a.m.  LOS: 1 day    [name] 05/21/2015, 4:58 PM

## 2015-05-21 NOTE — Care Management Note (Signed)
Case Management Note  Patient Details  Name: ARHAM ECONOMIDES MRN: [id number] Date of Birth: 1951/01/06  Subjective/Objective: 65 y/o m admitted w/CHF. From home.                   Action/Plan:d/c plan home.   Expected Discharge Date:                  Expected Discharge Plan:  Home/Self Care  In-House Referral:     Discharge planning Services  CM Consult  Post Acute Care Choice:    Choice offered to:     DME Arranged:    DME Agency:     HH Arranged:    HH Agency:     Status of Service:  In process, will continue to follow  Medicare Important Message Given:    Date Medicare IM Given:    Medicare IM give by:    Date Additional Medicare IM Given:    Additional Medicare Important Message give by:     If discussed at [place] of Stay Meetings, dates discussed:    Additional Comments:  Dessa Phi, RN 05/21/2015, 11:33 AM

## 2015-05-21 NOTE — Progress Notes (Signed)
Pt taken to Radiation oncology accompanied by RN and heart monitor.  Pt tolerated treatment well. Andre Lefort

## 2015-05-22 ENCOUNTER — Ambulatory Visit
Admission: RE | Admit: 2015-05-22 | Discharge: 2015-05-22 | Disposition: A | Discharge status: Home / Self Care | Payer: Medicare HMO | Source: Ambulatory Visit | Attending: Radiation Oncology | Admitting: Radiation Oncology

## 2015-05-22 LAB — BASIC METABOLIC PANEL
Anion gap: 10 (ref 5–15)
BUN: 32 mg/dL — AB (ref 6–20)
CALCIUM: 8.8 mg/dL — AB (ref 8.9–10.3)
CO2: 19 mmol/L — ABNORMAL LOW (ref 22–32)
CREATININE: 1.83 mg/dL — AB (ref 0.61–1.24)
Chloride: 109 mmol/L (ref 101–111)
GFR calc Af Amer: 43 mL/min — ABNORMAL LOW (ref >60)
GFR, EST NON AFRICAN AMERICAN: 37 mL/min — AB (ref >60)
Glucose, Bld: 114 mg/dL — ABNORMAL HIGH (ref 65–99)
Potassium: 3.5 mmol/L (ref 3.5–5.1)
SODIUM: 138 mmol/L (ref 135–145)

## 2015-05-22 LAB — BRAIN NATRIURETIC PEPTIDE: B NATRIURETIC PEPTIDE 5: 1372.3 pg/mL — AB (ref 0.0–100.0)

## 2015-05-22 LAB — MAGNESIUM: Magnesium: 1.8 mg/dL (ref 1.7–2.4)

## 2015-05-22 NOTE — Progress Notes (Signed)
Subjective:  Patient denies any chest pain.  States breathing is slowly improving  Objective:  Vital Signs in the last 24 hours: Temp:  [97.4 F (36.3 C)-97.6 F (36.4 C)] 97.6 F (36.4 C) (04/05 0540) Pulse Rate:  [60-65] 65 (04/05 0540) Resp:  [20] 20 (04/05 0540) BP: (125-127)/(87-89) 125/87 mmHg (04/05 0540) SpO2:  [99 %-100 %] 99 % (04/05 0540) Weight:  [73.392 kg (161 lb 12.8 oz)] 73.392 kg (161 lb 12.8 oz) (04/05 0540)  Intake/Output from previous day: 04/04 0701 - 04/05 0700 In: 120 [P.O.:120] Out: 825 [Urine:825] Intake/Output from this shift: Total I/O In: -  Out: 200 [Urine:200]  Physical Exam: Neck: no adenopathy, no carotid bruit, no JVD and supple, symmetrical, trachea midline Lungs: faint bibasilar rales noted Heart: regular rate and rhythm, S1, S2 normal and soft systolic murmur and S3 gallop noted Abdomen: soft, non-tender; bowel sounds normal; no masses,  no organomegaly Extremities: no clubbing, cyanosis.  Trace edema noted  Lab Results:  Recent Labs  05/20/15 1720 05/20/15 2231  WBC 3.4* 3.0*  HGB 14.2 14.0  PLT 162 187    Recent Labs  05/21/15 0424 05/22/15 0448  NA 140 138  K 3.6 3.5  CL 110 109  CO2 18* 19*  GLUCOSE 140* 114*  BUN 30* 32*  CREATININE 1.90* 1.83*    Recent Labs  05/21/15 0424 05/21/15 1003  TROPONINI 0.06* 0.05*   Hepatic Function Panel  Recent Labs  05/20/15 2231  PROT 6.0*  ALBUMIN 3.3*  AST 31  ALT 18  ALKPHOS 74  BILITOT 2.8*   No results for input(s): CHOL in the last 72 hours. No results for input(s): PROTIME in the last 72 hours.  Imaging: Imaging results have been reviewed and Dg Chest 2 View  05/20/2015  CLINICAL DATA:  Chest pain, shortness of breath, weakness and dizziness. EXAM: CHEST - 2 VIEW COMPARISON:  01/19/2015 FINDINGS: Stable appearance of biventricular pacemaker. The heart is moderately enlarged increased pulmonary vascularity is consistent with at least pulmonary venous  hypertension potentially mild interstitial edema/CHF. No pleural effusions are seen. No focal airspace consolidation or pneumothorax. Visualized bony structures are unremarkable. IMPRESSION: Moderate cardiac enlargement with evidence of pulmonary venous hypertension and potentially mild interstitial edema/CHF. Electronically Signed   By: Aletta Edouard M.D.   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 Daily weights, strict I and O's. Check labs in a.m. Will start low-dose Entresto from  tomorrow if renal function stable  LOS: 2 days    [name] 05/22/2015, 12:24 PM

## 2015-05-22 NOTE — Progress Notes (Signed)
Paged Dr. Terrence Dupont and he stated it was okay for patient to go off monitor for radiation today.Order place by RN.

## 2015-05-22 NOTE — Progress Notes (Signed)
[facility] Therapy Treatment Record Phone [phone]   Radiation Therapy was administered to [place] on: 05/22/2015  10:39 AM and was treatment # 36 out of a planned course of 40 treatments.  Radiation Treatment  1). Beam photons with 6-10 energy  2). Brachytherapy None  3). Stereotactic Radiosurgery None  4). Other Radiation None     Jakyle Petrucelli L, Rad Therap

## 2015-05-23 ENCOUNTER — Ambulatory Visit
Admission: RE | Admit: 2015-05-23 | Discharge: 2015-05-23 | Disposition: A | Discharge status: Home / Self Care | Payer: Medicare HMO | Source: Ambulatory Visit | Attending: Radiation Oncology | Admitting: Radiation Oncology

## 2015-05-23 ENCOUNTER — Ambulatory Visit: Payer: Medicare HMO

## 2015-05-23 LAB — BASIC METABOLIC PANEL
ANION GAP: 9 (ref 5–15)
BUN: 28 mg/dL — ABNORMAL HIGH (ref 6–20)
CHLORIDE: 109 mmol/L (ref 101–111)
CO2: 22 mmol/L (ref 22–32)
Calcium: 9 mg/dL (ref 8.9–10.3)
Creatinine, Ser: 1.53 mg/dL — ABNORMAL HIGH (ref 0.61–1.24)
GFR calc non Af Amer: 46 mL/min — ABNORMAL LOW (ref >60)
GFR, EST AFRICAN AMERICAN: 54 mL/min — AB (ref >60)
Glucose, Bld: 101 mg/dL — ABNORMAL HIGH (ref 65–99)
Potassium: 3.5 mmol/L (ref 3.5–5.1)
Sodium: 140 mmol/L (ref 135–145)

## 2015-05-23 LAB — BRAIN NATRIURETIC PEPTIDE: B Natriuretic Peptide: 1635.9 pg/mL — ABNORMAL HIGH (ref 0.0–100.0)

## 2015-05-23 MED ORDER — FUROSEMIDE 10 MG/ML IJ SOLN
40.0000 mg | Freq: Once | INTRAMUSCULAR | Status: AC
  Administered 2015-05-23: 40 mg via INTRAVENOUS
  Filled 2015-05-23: qty 4

## 2015-05-23 MED ORDER — POTASSIUM CHLORIDE CRYS ER 20 MEQ PO TBCR
20.0000 meq | EXTENDED_RELEASE_TABLET | Freq: Once | ORAL | Status: AC
  Administered 2015-05-23: 20 meq via ORAL
  Filled 2015-05-23: qty 1

## 2015-05-23 NOTE — Progress Notes (Signed)
[facility] Therapy Treatment Record Phone [phone]   Radiation Therapy was administered to [name] on: 05/23/2015  9:55 AM and was treatment # 37 out of a planned course of 40 treatments.  Radiation Treatment  1). Beam photons with 6-10 energy  2). Brachytherapy None  3). Stereotactic Radiosurgery None  4). Other Radiation None     [name], Rad Therap

## 2015-05-23 NOTE — Care Management Important Message (Signed)
Important Message  Patient Details  Name: [name] MRN: [id number] Date of Birth: September 02, 1950   Medicare Important Message Given:  Yes    Camillo Flaming 05/23/2015, 10:26 AMImportant Message  Patient Details  Name: [name] MRN: [id number] Date of Birth: 14-Feb-1951   Medicare Important Message Given:  Yes    Camillo Flaming 05/23/2015, 10:26 AM

## 2015-05-23 NOTE — Progress Notes (Signed)
Subjective:  Still complains of shortness of breath with minimal leg swelling. Renal function gradually improving off ACE inhibitor for now  Objective:  Vital Signs in the last 24 hours: Temp:  [97.6 F (36.4 C)-97.7 F (36.5 C)] 97.7 F (36.5 C) (04/06 0544) Pulse Rate:  [59-75] 70 (04/06 0924) Resp:  [18-20] 18 (04/06 0544) BP: (130-144)/(86-99) 144/93 mmHg (04/06 0924) SpO2:  [95 %-100 %] 95 % (04/06 0544) Weight:  [74.7 kg (164 lb 10.9 oz)] 74.7 kg (164 lb 10.9 oz) (04/06 0544)  Intake/Output from previous day: 04/05 0701 - 04/06 0700 In: 120 [P.O.:120] Out: 1225 [Urine:1225] Intake/Output from this shift:    Physical Exam: Neck: no adenopathy, no carotid bruit and supple, symmetrical, trachea midline Lungs: Decreased breath sound at bases with faint rales Heart: regular rate and rhythm, S1, S2 normal and Soft systolic murmur and S3 gallop noted Abdomen: soft, non-tender; bowel sounds normal; no masses,  no organomegaly Extremities: No clubbing cyanosis 1+ edema noted  Lab Results:  Recent Labs  05/20/15 1720 05/20/15 2231  WBC 3.4* 3.0*  HGB 14.2 14.0  PLT 162 187    Recent Labs  05/22/15 0448 05/23/15 0501  NA 138 140  K 3.5 3.5  CL 109 109  CO2 19* 22  GLUCOSE 114* 101*  BUN 32* 28*  CREATININE 1.83* 1.53*    Recent Labs  05/21/15 0424 05/21/15 1003  TROPONINI 0.06* 0.05*   Hepatic Function Panel  Recent Labs  05/20/15 2231  PROT 6.0*  ALBUMIN 3.3*  AST 31  ALT 18  ALKPHOS 74  BILITOT 2.8*   No results for input(s): CHOL in the last 72 hours. No results for input(s): PROTIME in the last 72 hours.  Imaging: Imaging results have been reviewed and No results found.  Cardiac Studies:  Assessment/Plan:  Status post Atypical chest pain MI ruled out Mild decompensated systolic congestive heart failure Minimally elevated troponin I secondary to about doubt significant MI Nonischemic dilated cardiomyopathy History of tachybradycardia  syndrome status post complete heart block/CRT pacemaker Hypertension Chronic kidney disease stage IV GERD Status post SVT ablation 2 History of paroxysmal atrial flutter chairs fast score of 3 On chronic anticoagulation Carcinoma of prostate Gouty arthritis Plan Lasix 1 extra dose today IV Check renal function in a.m. we'll start Entresto once euvolemic and renal function stable  LOS: 3 days    [name] 05/23/2015, 10:27 AM

## 2015-05-24 ENCOUNTER — Encounter: Payer: Self-pay | Admitting: Radiation Oncology

## 2015-05-24 ENCOUNTER — Ambulatory Visit
Admit: 2015-05-24 | Discharge: 2015-05-24 | Disposition: A | Discharge status: Home / Self Care | Payer: Medicare HMO | Attending: Radiation Oncology | Admitting: Radiation Oncology

## 2015-05-24 ENCOUNTER — Ambulatory Visit
Admission: RE | Admit: 2015-05-24 | Discharge: 2015-05-24 | Disposition: A | Discharge status: Home / Self Care | Payer: Medicare HMO | Source: Ambulatory Visit | Attending: Radiation Oncology | Admitting: Radiation Oncology

## 2015-05-24 ENCOUNTER — Ambulatory Visit: Payer: Medicare HMO

## 2015-05-24 VITALS — BP 135/90 | HR 65 | Temp 97.1°F | Ht 67.0 in

## 2015-05-24 DIAGNOSIS — C61 Malignant neoplasm of prostate: Secondary | ICD-10-CM

## 2015-05-24 LAB — BASIC METABOLIC PANEL
ANION GAP: 10 (ref 5–15)
BUN: 26 mg/dL — ABNORMAL HIGH (ref 6–20)
CALCIUM: 9.4 mg/dL (ref 8.9–10.3)
CO2: 24 mmol/L (ref 22–32)
Chloride: 108 mmol/L (ref 101–111)
Creatinine, Ser: 1.66 mg/dL — ABNORMAL HIGH (ref 0.61–1.24)
GFR, EST AFRICAN AMERICAN: 49 mL/min — AB (ref >60)
GFR, EST NON AFRICAN AMERICAN: 42 mL/min — AB (ref >60)
GLUCOSE: 100 mg/dL — AB (ref 65–99)
POTASSIUM: 3.8 mmol/L (ref 3.5–5.1)
SODIUM: 142 mmol/L (ref 135–145)

## 2015-05-24 LAB — BRAIN NATRIURETIC PEPTIDE: B NATRIURETIC PEPTIDE 5: 1971.4 pg/mL — AB (ref 0.0–100.0)

## 2015-05-24 MED ORDER — SACUBITRIL-VALSARTAN 24-26 MG PO TABS
1.0000 | ORAL_TABLET | Freq: Two times a day (BID) | ORAL | Status: DC
  Administered 2015-05-24 – 2015-05-25 (×2): 1 via ORAL
  Filled 2015-05-24 (×3): qty 1

## 2015-05-24 NOTE — Progress Notes (Signed)
[facility] Therapy Treatment Record Phone [phone]   Radiation Therapy was administered to [name] on: 05/24/2015  11:41 AM and was treatment # 38 out of a planned course of 40 treatments.  Radiation Treatment  1). Beam photons with 6-10 energy  2). Brachytherapy None  3). Stereotactic Radiosurgery None  4). Other Radiation None     [name] J Traci Gafford, Rad Tech

## 2015-05-24 NOTE — Progress Notes (Signed)
OT Cancellation Note  Patient Details Name: [name] MRN: [id number] DOB: 24-Jan-1951   Cancelled Treatment:    Reason Eval/Treat Not Completed: Other (comment).  Pt does not feel he needs OT.  He has been getting up going to bathroom and states his wife will assist him at home.  He does state that his legs are weaker and may benefit from PT.  Pt states that he hasn't slept in 3 days and has been dealing with a headache for the past couple of days.  Will sign off from OT; however, if pt has needs, we will be happy to come back and see him.    [name] 05/24/2015, 2:55 PM  [name], OTR/L [phone] 05/24/2015

## 2015-05-24 NOTE — Evaluation (Signed)
Physical Therapy Evaluation Patient Details Name: [name] MRN: [id number] DOB: [birth year] Today's Date: 05/24/2015   History of Present Illness  Chief Complaint: vague left-sided chest pain associated with progressive Increasing shortness of breath and Generalized weakness and abdominal pain.HPI: patient is 65 year old male with past medical history significant for mild coronary artery disease,severe nonischemic dilated cardiomyopathy EF approximately 25%, history of tachybradycardia syndrome With complete heart block status post CRT-D biventricular pacemaker, hypertension, chronic kidney disease stage IV, history of supraventricular tachycardia status post ablation 2 in the past, history of atrial flutter in the past,chadsvasc score of 3 on chronic anticoagulation history of EtOH Abuse,came to the ER complaining of vague left-sided chest pain associated with progressive increasing shortness of breath and generalized weakness for last 1 week and was noted to have elevated BNPff thousand 20 and minimally elevated troponin I of 0.06. Patient also complains of vague GERD symptoms. States recently was diagnosed to have prostate cancer went for radiation therapy but due to generalized weakness and above symptoms was transferred to the ED for further evaluation. Found to have CHF.   Clinical Impression  Pt tolerated session well, he did want to see how he would do walking and he stated he felt much better than 2 days ago. O2 sats 99% and HR 81, tolerated walking well and with no LOB. No other PT needs at this time. REcommend pt walk with family or staff as he feels.     Follow Up Recommendations No PT follow up    Equipment Recommendations  None recommended by PT    Recommendations for Other Services       Precautions / Restrictions        Mobility  Bed Mobility Overal bed mobility: Independent                Transfers Overall transfer level: Modified independent Equipment  used: None             General transfer comment: educated on slowing down sit to stand , to pause after coming to sitting, then rise so that he will not get dizzy  Ambulation/Gait Ambulation/Gait assistance: Supervision Ambulation Distance (Feet): 180 Feet Assistive device: None Gait Pattern/deviations: Step-through pattern     General Gait Details: steady on feet , tolerated well and stated he would not been able to do this 2 days ago, taht he was excited the fluid had come off and his breathing is much better and legs are not swollen as much.   Stairs            Wheelchair Mobility    Modified Rankin (Stroke Patients Only)       Balance Overall balance assessment: No apparent balance deficits (not formally assessed)                                           Pertinent Vitals/Pain Pain Assessment:  (stated he used to have pain in mid stomach/intestine area from hernia , however with the fluids coming off, the pain is much better today )    Home Living Family/patient expects to be discharged to:: Private residence Living Arrangements: Spouse/significant other Available Help at Discharge: Family Type of Home: House Home Access: Stairs to enter Entrance Stairs-Rails: Can reach both Entrance Stairs-Number of Steps: 4 Home Layout: One level        Prior Function Level of Independence: Independent  Comments: wife helps if need      Hand Dominance        Extremity/Trunk Assessment               Lower Extremity Assessment: Overall WFL for tasks assessed         Communication   Communication: No difficulties  Cognition Arousal/Alertness: Awake/alert Behavior During Therapy: WFL for tasks assessed/performed Overall Cognitive Status: Within Functional Limits for tasks assessed                      General Comments      Exercises        Assessment/Plan    PT Assessment Patent does not need any  further PT services  PT Diagnosis Difficulty walking   PT Problem List    PT Treatment Interventions     PT Goals (Current goals can be found in the Care Plan section) Acute Rehab PT Goals PT Goal Formulation: All assessment and education complete, DC therapy    Frequency     Barriers to discharge        Co-evaluation               End of Session   Activity Tolerance: Patient tolerated treatment well Patient left: in bed;with call bell/phone within reach;with bed alarm set Nurse Communication: Mobility status         Time: [id number] PT Time Calculation (min) (ACUTE ONLY): 20 min   Charges:   PT Evaluation $PT Eval Low Complexity: 1 Procedure     PT G CodesClide Dales 06-07-2015, 5:04 PM Clide Dales, PT Pager: [phone] 2015-06-07

## 2015-05-24 NOTE — Progress Notes (Signed)
Subjective:  Patient denies any chest pain.  States breathing is slowly improving leg swelling improved.  Diurese more than 5 L yesterday.  Renal function fairly stable2  Objective:  Vital Signs in the last 24 hours: Temp:  [97.4 F (36.3 C)-97.7 F (36.5 C)] 97.6 F (36.4 C) (04/07 0527) Pulse Rate:  [66-73] 73 (04/07 0527) Resp:  [18] 18 (04/07 0527) BP: (140-151)/(97) 151/97 mmHg (04/07 0527) SpO2:  [97 %] 97 % (04/07 0527) Weight:  [69.6 kg (153 lb 7 oz)] 69.6 kg (153 lb 7 oz) (04/07 0527)  Intake/Output from previous day: 04/06 0701 - 04/07 0700 In: 240 [P.O.:240] Out: 5550 [Urine:5550] Intake/Output from this shift:    Physical Exam: Neck: no adenopathy, no carotid bruit, no JVD and supple, symmetrical, trachea midline Lungs: decreased breath sounds at bases Heart: regular rate and rhythm, S1, S2 normal and soft systolic murmur noted Abdomen: soft, non-tender; bowel sounds normal; no masses,  no organomegaly Extremities: no clubbing, cyanosis.  Trace edema noted  Lab Results: No results for input(s): WBC, HGB, PLT in the last 72 hours.  Recent Labs  05/23/15 0501 05/24/15 0456  NA 140 142  K 3.5 3.8  CL 109 108  CO2 22 24  GLUCOSE 101* 100*  BUN 28* 26*  CREATININE 1.53* 1.66*   No results for input(s): TROPONINI in the last 72 hours.  Invalid input(s): CK, MB Hepatic Function Panel No results for input(s): PROT, ALBUMIN, AST, ALT, ALKPHOS, BILITOT, BILIDIR, IBILI in the last 72 hours. No results for input(s): CHOL in the last 72 hours. No results for input(s): PROTIME in the last 72 hours.  Imaging: Imaging results have been reviewed and No results found.  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  LOS: 4 days    [name] 05/24/2015, 11:07 AM

## 2015-05-24 NOTE — Progress Notes (Signed)
  Radiation Oncology         [phone] ________________________________  Name: [name] MRN: [id number]  Date: 05/20/2015  DOB: 10-Aug-1950  Weekly Radiation Therapy Management INPATIENT  Current Dose: 74.1 Gy     Planned Dose:  78 Gy  Narrative . . . . . . . . The patient presents for routine under treatment assessment.                                   The patient is without complaint.                                 Set-up films were reviewed.                                 The chart was checked.  [name] has received 38 fractions to his prostate. He reports that intermittently, when he voids he has to strain at the end of his urinary stream to ensure complete bladder emptying. Reports intermittent dysuria when voiding and rectal irritation upon defecation with occasional loose stools. Some instances of diarrhea alternating with constipation. Denies bothersome nocturia. No hematuria. IP presently on Cardiac unit in room 1414. Recent hx of epigastric pain and SOB and congestive heart failure. Also complains of headaches  States he was admitted for an abdominal hernia, his pain was severe causing him to lose his breath.  Physical Findings. . .  height is 5\' 7"  (1.702 m) and weight is 153 lb 7 oz (69.6 kg). His axillary temperature is 97.3 F (36.3 C). His blood pressure is 133/68 and his pulse is 69. His respiration is 16 and oxygen saturation is 98%. .    The heart has a regular rate and rhythm. The lungs are clear to auscultation., w/o rhonchi, wheezes or rales.   Impression . . . . . . . The patient is tolerating radiation. Coping with other medical issues. Continue care as inpatient.  Plan . . . . . . . . . . . . Continue treatment as planned.    -----------------------------------  Eppie Gibson, MD  This document serves as a record of services personally performed by Eppie Gibson, MD. It was created on her behalf by Derek Mound, a trained medical scribe. The creation  of this record is based on the scribe's personal observations and the provider's statements to them. This document has been checked and approved by the attending provider.

## 2015-05-24 NOTE — Progress Notes (Addendum)
Mr. Bryan Lemma has received 38 fractions to his prostate.  He reports that intermittently, when he voids he has to strain at the end of his urinary stream to ensure complete bladder emptying. Reports intermittent dysuria when voiding and rectal irritation upon defecation with occasional loose stools . No hematuria.  IP presently on Cardiac unit in room 1414.  Recent hx of epigastric pain and SOB and congestive heart failure  BP 135/90 mmHg  Pulse 65  Temp(Src) 97.1 F (36.2 C)  Ht 5\' 7"  (1.702 m)  SpO2 100%

## 2015-05-25 LAB — BASIC METABOLIC PANEL
Anion gap: 8 (ref 5–15)
BUN: 30 mg/dL — AB (ref 6–20)
CO2: 25 mmol/L (ref 22–32)
Calcium: 9.3 mg/dL (ref 8.9–10.3)
Chloride: 105 mmol/L (ref 101–111)
Creatinine, Ser: 1.71 mg/dL — ABNORMAL HIGH (ref 0.61–1.24)
GFR calc Af Amer: 47 mL/min — ABNORMAL LOW (ref >60)
GFR, EST NON AFRICAN AMERICAN: 41 mL/min — AB (ref >60)
GLUCOSE: 101 mg/dL — AB (ref 65–99)
POTASSIUM: 3.9 mmol/L (ref 3.5–5.1)
Sodium: 138 mmol/L (ref 135–145)

## 2015-05-25 LAB — CBC
HCT: 41.3 % (ref 39.0–52.0)
HEMOGLOBIN: 13.8 g/dL (ref 13.0–17.0)
MCH: 33.8 pg (ref 26.0–34.0)
MCHC: 33.4 g/dL (ref 30.0–36.0)
MCV: 101.2 fL — AB (ref 78.0–100.0)
Platelets: 191 10*3/uL (ref 150–400)
RBC: 4.08 MIL/uL — ABNORMAL LOW (ref 4.22–5.81)
RDW: 14.7 % (ref 11.5–15.5)
WBC: 4.6 10*3/uL (ref 4.0–10.5)

## 2015-05-25 LAB — BRAIN NATRIURETIC PEPTIDE: B Natriuretic Peptide: 1530.3 pg/mL — ABNORMAL HIGH (ref 0.0–100.0)

## 2015-05-25 MED ORDER — ISOSORB DINITRATE-HYDRALAZINE 20-37.5 MG PO TABS: 1.0000 | ORAL_TABLET | Freq: Two times a day (BID) | ORAL | Status: AC

## 2015-05-25 MED ORDER — SACUBITRIL-VALSARTAN 24-26 MG PO TABS: 1.0000 | ORAL_TABLET | Freq: Two times a day (BID) | ORAL | Status: AC

## 2015-05-25 MED ORDER — DIGOXIN 62.5 MCG PO TABS: 0.0625 mg | ORAL_TABLET | Freq: Every day | ORAL | Status: AC

## 2015-05-25 NOTE — Discharge Summary (Signed)
Discharge summary dictated on 05/25/2015 dictation number is [phone]

## 2015-05-25 NOTE — Progress Notes (Signed)
Discharge teaching complete. Answered all questions. Patient will be leaving with family in stable condition.

## 2015-05-25 NOTE — Discharge Instructions (Signed)

## 2015-05-26 NOTE — Discharge Summary (Signed)
NAMESAINTCLAIR, MCEVOY                 ACCOUNT NO.:  [id number]  MEDICAL RECORD NO.:  [id number]  LOCATION:  [id number]                         FACILITY:  [facility]  PHYSICIAN:  Marycatherine Maniscalco N. Terrence Dupont, M.D. DATE OF BIRTH:  11-Apr-1950  DATE OF ADMISSION:  05/20/2015 DATE OF DISCHARGE:  05/25/2015                              DISCHARGE SUMMARY   ADMITTING DIAGNOSES: 1. Atypical chest pain, rule out myocardial infarction. 2. Mild decompensated acute systolic congestive heart failure. 3. Minimally elevated troponin-I secondary to above, doubt significant     myocardial infarction. 4. Nonischemic dilated cardiomyopathy. 5. History of tachybrady syndrome, status post complete heart block,     status post CRT pacemaker. 6. Hypertension. 7. Chronic kidney disease, stage IV. 8. Gastroesophageal reflux disease. 9. History of supraventricular tachycardia ablation x2 in the past. 10.History of paroxysmal atrial flutter with CHADS score of 3, on     chronic anticoagulation. 11.Cancer of the prostate. 12.Gouty arthritis.  DISCHARGE DIAGNOSES: 1. Status post atypical chest pain, myocardial infarction ruled out. 2. Compensated systolic congestive heart failure. 3. Minimally elevated troponin-I secondary to above. 4. Nonischemic dilated cardiomyopathy. 5. History of tachybrady syndrome, status post complete heart block,     status post CRT pacemaker. 6. Hypertension. 7. Chronic kidney disease, stage IV. 8. Gastroesophageal reflux disease. 9. History of supraventricular tachycardia ablation x2 in the past. 10.History of paroxysmal atrial flutter with CHADS-VASc score of 3, on     chronic anticoagulation. 11.Carcinoma of the prostate. 12.Gouty arthritis.  DISCHARGE MEDICATIONS: 1. Digoxin 62.5 mcg one tablet daily. 2. BiDil one tablet twice daily. 3. Entresto 24/26 mg one tablet twice daily. 4. Tylenol 500 mg every 6 hours as needed as before. 5. Allopurinol 100 mg daily. 6. Amiodarone 200 mg one tablet  daily. 7. Calcium carbonate 750 mg one tablet daily. 8. Carvedilol 12.5 mg one tablet twice daily. 9. Lasix 40 mg daily. 10.Multivitamin with minerals half tablet twice daily as before. 11.Oxycodone 5 mg twice daily as before. 12.Protonix 40 mg daily as before. 13.Pravastatin 40 mg daily. 14.Ranitidine 150 mg twice daily. 15.Savaysa 30 mg daily.  The patient has been advised to stop lisinopril and lorazepam.  DIET:  Low salt, low cholesterol.  INSTRUCTIONS:  Heart failure instructions have been given.  The patient has been advised to monitor weight daily.  FOLLOWUP:  Follow up with [name] in 1 week.  CONDITION AT DISCHARGE:  Stable.  The patient is a 65 year old male with past medical history significant for mild coronary artery disease; severe nonischemic dilated cardiomyopathy, ejection fraction approximately 25%; history of tachybrady syndrome with complete heart block, status post CRT biventricular pacemaker; hypertension; chronic kidney disease, stage IV; history of supraventricular tachycardia, status post ablation x2 in the past; history of atrial flutter in the past, CHADS-VASc score of 3, on chronic anticoagulation; history of alcohol abuse in the past.  He came to the ER complaining of vague left-sided chest pain associated with progressive increasing shortness of breath and generalized weakness for the last 1 week and was noted to have elevated BNP above 2000 with minimally elevated troponin-I of 0.06.  The patient also complains of vague GERD symptoms, states recently was diagnosed to  have prostate cancer, went for radiation therapy due to generalized weakness and above symptoms, was transferred to ED for further evaluation.  PHYSICAL EXAMINATION:  GENERAL:  He was alert, awake, and oriented x3. VITAL SIGNS:  Blood pressure was 127/100, pulse 67, he was afebrile. HEENT:  Conjunctivae were pink. NECK:  Supple.  Positive JVD. LUNGS:  Decreased breath sounds at  bases with faint bilateral rales. CARDIOVASCULAR:  S1, S2 was normal.  There was 2/6 systolic murmur and S3 gallop. ABDOMEN:  Soft.  Bowel sounds were present.  Nontender. EXTREMITIES:  There was no clubbing, cyanosis.  There was trace edema.  LABORATORY DATA:  Chest x-ray showed moderate cardiac enlargement with evidence of pulmonary venous hypertension and potentially mild interstitial edema.  His sodium was 140, potassium 3.5, BUN 27, creatinine 1.75.  BNP was 2020.  Repeat BNP was 2245.  Last BNP was 1530, which is trending down.  Hemoglobin was 14.2, hematocrit 42.6, white count of 3.4.  Last electrolytes; sodium was 138, potassium 3.9, BUN 30, creatinine 1.71.  EKG showed paced rhythm.  BRIEF HOSPITAL COURSE:  The patient was admitted to telemetry unit.  MI was ruled out by serial enzymes on EKG.  The patient did not have any chest pain during the hospital stay.  The patient was started on IV Lasix and lisinopril was held with good diuresis.  The patient had some worsening renal function.  His ACE inhibitors were held with improvement in his renal function.  The patient was started on Entresto.  His Lasix dose has been reduced.  His renal function has remained stable at the baseline.  The patient will be discharged home on above medications and will be followed up by [name] in 1 week.  His TSH was slightly elevated, which is attributed to amiodarone.  We will recheck his TSH and monitor his renal function as outpatient, may need low dose of Synthroid as outpatient.  The patient's leg swelling is completely resolved.  He is ambulating in hallway without any problems.  States his breathing is markedly improved and is eager to go.  The patient will be discharged home on above medications.  He will be followed up closely by [name] in 1 week and EP as outpatient as scheduled.     [name], M.D.     MNH/MEDQ  D:  05/25/2015  T:  05/25/2015  Job:  [id number]

## 2015-05-27 ENCOUNTER — Ambulatory Visit: Payer: Medicare HMO

## 2015-05-27 ENCOUNTER — Ambulatory Visit
Admission: RE | Admit: 2015-05-27 | Discharge: 2015-05-27 | Disposition: A | Discharge status: Home / Self Care | Payer: Medicare HMO | Source: Ambulatory Visit | Attending: Radiation Oncology | Admitting: Radiation Oncology

## 2015-05-27 ENCOUNTER — Ambulatory Visit (INDEPENDENT_AMBULATORY_CARE_PROVIDER_SITE_OTHER): Payer: Medicare HMO | Admitting: *Deleted

## 2015-05-27 DIAGNOSIS — Z950 Presence of cardiac pacemaker: Secondary | ICD-10-CM

## 2015-05-27 DIAGNOSIS — I5022 Chronic systolic (congestive) heart failure: Secondary | ICD-10-CM | POA: Diagnosis not present

## 2015-05-27 DIAGNOSIS — I469 Cardiac arrest, cause unspecified: Secondary | ICD-10-CM | POA: Diagnosis not present

## 2015-05-27 DIAGNOSIS — Z510 Encounter for antineoplastic radiation therapy: Secondary | ICD-10-CM | POA: Diagnosis not present

## 2015-05-27 DIAGNOSIS — C61 Malignant neoplasm of prostate: Secondary | ICD-10-CM | POA: Diagnosis present

## 2015-05-27 NOTE — Progress Notes (Addendum)
EPIC Encounter for ICM Monitoring  Patient Name: [name] is a 65 y.o. male Date: 05/27/2015 Primary Care Physican: [name], MD Primary Cardiologist: Doylene Canard Electrophysiologist: Lovena Le Dry Weight: Unknown - has not weighed since hospital discharge   Bi-V Pacing 65% (was 90% on 11/14/2014)      In the past month, have you:  1. Gained more than 2 pounds in a day or more than 5 pounds in a week? Yes, 10 lbs - has not weighed since hospital discharge  2. Had changes in your medications (with verification of current medications)? Yes, Entresto 24/26 mg bid was prescribed at hospital discharge on 05/25/2015.  He is unable to afford Entresto at this time and will discuss with Dr Doylene Canard at the office appointment in the next week.   3. Had more shortness of breath than is usual for you? Yes, increase in SOB during activities  4. Limited your activity because of shortness of breath? Yes, difficult to do activities  5. Not been able to sleep because of shortness of breath? no  6. Had increased swelling in your feet or ankles? Yes and stomach swelling  7. Had symptoms of dehydration (dizziness, dry mouth, increased thirst, decreased urine output) no  8. Had changes in sodium restriction? no  9. Been compliant with medication? Yes   ICM trend: 3 month view for 05/27/2015   ICM trend: 1 year view for 05/27/2015   Follow-up plan: ICM clinic phone appointment on 06/11/2015.  1st ICM encounter with patient.   Thoracic impedance below reference line from 04/22/2015 to 05/04/2015 and 05/12/2015 to 05/25/2015 suggesting fluid accumulation and returned to reference line 05/25/2015 which correlates with being diuresed at the hospital 05/20/2015 to 05/25/2015.  He reported weight gain, SOB and lower extremity and stomach swelling have resolved after being in hospital.  Encouraged to call if he develops any fluid symptoms.        05/25/2015 Creatinine 1.71, BUN 30, Potassium 3.9  05/20/2015 Creatinine  1.75, BUN 27, Potassium 3.5 01/22/2015 Creatinine 1.86, BUN 40, Potassium 3.9 10/07/2013 Creatinine 1.43, BUN 18, Potassium 4.5 BNP ranged 2257.8 to 1530.3 between 05/20/2015 to 05/25/2015 BNP 01/23/2015 511.1  He has an hospital follow up appointment with Dr Doylene Canard in the next week.   Repeat transmission scheduled for   11/14/2014 AT/AF burden 6.4% 05/27/2015 AT/AF burden 37%  Copy of note sent to patient's primary care physician, primary cardiologist, and device following physician.  Rosalene Billings, RN, CCM 05/27/2015 10:48 AM

## 2015-05-27 NOTE — Progress Notes (Signed)
Remote pacemaker transmission.   

## 2015-05-28 ENCOUNTER — Ambulatory Visit
Admission: RE | Admit: 2015-05-28 | Discharge: 2015-05-28 | Disposition: A | Discharge status: Home / Self Care | Payer: Medicare HMO | Source: Ambulatory Visit | Attending: Radiation Oncology | Admitting: Radiation Oncology

## 2015-05-28 ENCOUNTER — Encounter: Payer: Self-pay | Admitting: Radiation Oncology

## 2015-05-28 DIAGNOSIS — Z510 Encounter for antineoplastic radiation therapy: Secondary | ICD-10-CM | POA: Diagnosis not present

## 2015-06-09 NOTE — Progress Notes (Signed)
  Radiation Oncology         [phone] ________________________________  Name: [name] MRN: [id number]  Date: 05/28/2015  DOB: March 17, 1950  End of Treatment Note   ICD-9-CM ICD-10-CM    1. Malignant neoplasm of prostate ([place]) 185 C61     DIAGNOSIS: 65 y.o. gentleman with stage T1c adenocarcinoma of the prostate with a Gleason's score of 3+4 and a PSA of 8.08     Indication for treatment:  Curative, Definitive Radiotherapy       Radiation treatment dates:   04/02/2015-05/28/2015  Site/dose:   The prostate was treated to 78 Gy in 40 fractions of 1.95 Gy  Beams/energy:   The patient was treated with IMRT using volumetric arc therapy delivering 6 MV X-rays to clockwise and counterclockwise circumferential arcs with a 90 degree collimator offset to avoid dose scalloping.  Image guidance was performed with daily cone beam CT prior to each fraction to align to gold markers in the prostate and assure proper bladder and rectal fill volumes.  Immobilization was achieved with BodyFix custom mold.  Narrative: The patient tolerated radiation treatment relatively well.   The patient experienced some minor urinary irritation and modest fatigue. He noted intermittently, when he voids he has to strain at the end of his urinary stream to ensure complete bladder emptying. Reports intermittent dysuria when voiding and rectal irritation upon defecation with occasional loose stools . No hematuria. He was admitted during the final week of radiation to the cardiac unit and discharged following observation.  Plan: The patient has completed radiation treatment. He will return to radiation oncology clinic for routine followup in one month. I advised him to call or return sooner if he has any questions or concerns related to his recovery or treatment. ________________________________  Sheral Apley. Tammi Klippel, M.D.

## 2015-06-11 ENCOUNTER — Telehealth: Payer: Self-pay

## 2015-06-11 ENCOUNTER — Ambulatory Visit (INDEPENDENT_AMBULATORY_CARE_PROVIDER_SITE_OTHER): Payer: Medicare HMO

## 2015-06-11 DIAGNOSIS — I5022 Chronic systolic (congestive) heart failure: Secondary | ICD-10-CM

## 2015-06-11 DIAGNOSIS — Z950 Presence of cardiac pacemaker: Secondary | ICD-10-CM

## 2015-06-11 NOTE — Progress Notes (Signed)
EPIC Encounter for ICM Monitoring  Patient Name: [name] is a 65 y.o. male Date: 06/11/2015 Primary Care Physican: [name], MD Primary Cardiologist: Doylene Canard Electrophysiologist: Lovena Le Dry Weight: unknown  Bi-V Pacing 67%      In the past month, have you:  1. Gained more than 2 pounds in a day or more than 5 pounds in a week? N/A  2. Had changes in your medications (with verification of current medications)? N/A  3. Had more shortness of breath than is usual for you? N/A  4. Limited your activity because of shortness of breath? N/A  5. Not been able to sleep because of shortness of breath? N/A  6. Had increased swelling in your feet or ankles? N/A  7. Had symptoms of dehydration (dizziness, dry mouth, increased thirst, decreased urine output) N/A  8. Had changes in sodium restriction? N/A  9. Been compliant with medication? N/A  ICM trend: 3 month view for 06/11/2015  ICM trend: 1 year view for 06/11/2015  Follow-up plan: ICM clinic phone appointment 07/19/2015.    Attempted call to patient and unable to reach.  Transmission reviewed.   FLUID LEVELS: [place] thoracic impedance trending along baseline suggesting stable fluid levels.    AT/AF Burden 36%  Rosalene Billings, RN, CCM 06/11/2015 2:08 PM

## 2015-06-11 NOTE — Telephone Encounter (Signed)
Remote ICM transmission received.  Attempted patient call and left message for return call.   

## 2015-06-17 ENCOUNTER — Telehealth: Payer: Self-pay | Admitting: Gastroenterology

## 2015-06-17 NOTE — Telephone Encounter (Signed)
Not sure if this is GI related.  For now, add ranitidine 150mg  pills (OTC) take one pill at bedtime every night. Stay on protonix once daily (should be 20-30 min before BF meal)

## 2015-06-17 NOTE — Telephone Encounter (Signed)
Patient reports that he has "pressure when I am walking around". He states he has pain with activity,  reports that he still has some pain with eating or drinking. He has seen his cardiologist recently.  I asked him to be more specific about the location of his pain "right where my hiatal hernia is".    He describes the pain in the area "above my navel but below my ribs in the center".  Dr. Ardis Hughs he is requesting "something for my hernia".  Please advise recent EGD with biopsy in March.  He is on protonix QD

## 2015-06-17 NOTE — Telephone Encounter (Signed)
The pt has been notified and will call back with an update

## 2015-06-18 ENCOUNTER — Telehealth: Payer: Self-pay | Admitting: Radiation Oncology

## 2015-06-18 NOTE — Telephone Encounter (Signed)
Phoned patient. Explained that Shona Simpson recommends he follow up with his PCP reference his burning hip pain. Explained that his PSA range of 8 yields him at less than a 1% chance of bony disease thus, this pain is most likely related to something other than prostate cancer. Patient verbalized understanding and expressed appreciation for the return call.

## 2015-06-18 NOTE — Telephone Encounter (Signed)
-----   Message from Hayden Pedro, [place] sent at 06/18/2015 10:09 AM EDT ----- Regarding: RE: Pain Contact: [phone] I'm not too sure about his pain. He hasn't been back in since he finished, but with his PSA in the 8 range, he shouldn't have bone disease. Dr. Tammi Klippel usually quotes patients as having less than 1% chance of bony disease with a PSA less than 10. I would suggest he follow up with his PCP Dr. York Ram first for a work up.  Thanks, Bryson Ha  ----- Message -----    From: Heywood Footman, RN    Sent: 06/17/2015  10:23 AM      To: Hayden Pedro, PA-C Subject: Pain                                           Bryson Ha.  [name] phoned today requesting pain medication. He reports burning hip pain worse with ambulation plus intermittent stabbing pains all over. He completed prostate radiation on 05/28/15. He is allergic to lopressor and ibuprofen. I saw that he took Oxy IR in July 2015 prescribed by Dr. Ladora Daniel.   [name]

## 2015-06-18 NOTE — Telephone Encounter (Signed)
-----   Message from Hayden Pedro, [place] sent at 06/18/2015 10:09 AM EDT ----- Regarding: RE: Pain Contact: [phone] I'm not too sure about his pain. He hasn't been back in since he finished, but with his PSA in the 8 range, he shouldn't have bone disease. Dr. Tammi Klippel usually quotes patients as having less than 1% chance of bony disease with a PSA less than 10. I would suggest he follow up with his PCP Dr. York Ram first for a work up.  Thanks, Bryson Ha  ----- Message -----    From: Heywood Footman, RN    Sent: 06/17/2015  10:23 AM      To: Hayden Pedro, PA-C Subject: Pain                                           Bryson Ha.  Osmel Emel phoned today requesting pain medication. He reports burning hip pain worse with ambulation plus intermittent stabbing pains all over. He completed prostate radiation on 05/28/15. He is allergic to lopressor and ibuprofen. I saw that he took Oxy IR in July 2015 prescribed by Dr. Ladora Daniel.   [name]

## 2015-06-24 ENCOUNTER — Telehealth: Payer: Self-pay | Admitting: Radiation Oncology

## 2015-06-24 NOTE — Telephone Encounter (Signed)
Received call from patient. Patient reports the burning pain in his hips continues. Questioned if he presented to his PCP for evaluation. Patient states, "I forgot you told me to do that." Explained that his PSA range of 8 yields him at less than a 1% chance of bony disease thus, this pain is most likely related to something other than prostate cancer. Patient verbalized understanding. Will follow up with the patient the end of this week.

## 2015-06-27 ENCOUNTER — Telehealth: Payer: Self-pay | Admitting: Internal Medicine

## 2015-06-27 NOTE — Telephone Encounter (Signed)
Spoke w/ pt and informed him that he had a remote transmission on 06-11-2015 and he isn't due again until 07-19-2015. Informed pt that according to [facility] clinic nurse note it was a normal remote transmission. Pt verbalized understanding.

## 2015-06-27 NOTE — Telephone Encounter (Signed)
New Message  4. Are you calling to see if we received your device transmission? Yes! Pt states that he was supposed to send one in yesterday 06/26/2015 and he is checking to see if it was received.

## 2015-07-01 LAB — CUP PACEART REMOTE DEVICE CHECK
Battery Remaining Longevity: 91 mo
Battery Remaining Percentage: 95.5 %
Brady Statistic AS VP Percent: 61 %
Brady Statistic AS VS Percent: 1.3 %
Implantable Lead Implant Date: 20150820
Implantable Lead Implant Date: 20150820
Implantable Lead Implant Date: 20150820
Implantable Lead Location: 753858
Implantable Lead Location: 753860
Lead Channel Impedance Value: 310 Ohm
Lead Channel Impedance Value: 450 Ohm
Lead Channel Pacing Threshold Amplitude: 0.625 V
Lead Channel Pacing Threshold Amplitude: 1.25 V
Lead Channel Pacing Threshold Pulse Width: 0.4 ms
Lead Channel Sensing Intrinsic Amplitude: 3.6 mV
Lead Channel Setting Pacing Amplitude: 2 V
Lead Channel Setting Pacing Pulse Width: 0.4 ms
MDC IDC LEAD LOCATION: 753859
MDC IDC MSMT BATTERY VOLTAGE: 2.99 V
MDC IDC MSMT LEADCHNL RA PACING THRESHOLD PULSEWIDTH: 0.4 ms
MDC IDC MSMT LEADCHNL RV IMPEDANCE VALUE: 450 Ohm
MDC IDC MSMT LEADCHNL RV SENSING INTR AMPL: 6.1 mV
MDC IDC PG SERIAL: 7614436
MDC IDC SESS DTM: 20170410070146
MDC IDC SET LEADCHNL LV PACING AMPLITUDE: 2.5 V
MDC IDC SET LEADCHNL LV PACING PULSEWIDTH: 0.4 ms
MDC IDC SET LEADCHNL RV PACING AMPLITUDE: 2.25 V
MDC IDC SET LEADCHNL RV SENSING SENSITIVITY: 2 mV
MDC IDC STAT BRADY AP VP PERCENT: 37 %
MDC IDC STAT BRADY AP VS PERCENT: 1 %
MDC IDC STAT BRADY RA PERCENT PACED: 24 %
Pulse Gen Model: 3242

## 2015-07-02 ENCOUNTER — Telehealth: Payer: Self-pay | Admitting: Radiation Oncology

## 2015-07-02 ENCOUNTER — Encounter: Payer: Self-pay | Admitting: Gastroenterology

## 2015-07-02 ENCOUNTER — Encounter: Payer: Self-pay | Admitting: Cardiology

## 2015-07-02 ENCOUNTER — Ambulatory Visit (INDEPENDENT_AMBULATORY_CARE_PROVIDER_SITE_OTHER): Payer: Medicare HMO | Admitting: Gastroenterology

## 2015-07-02 VITALS — BP 114/84 | HR 60 | Ht 67.0 in | Wt 149.2 lb

## 2015-07-02 DIAGNOSIS — R1084 Generalized abdominal pain: Secondary | ICD-10-CM | POA: Diagnosis not present

## 2015-07-02 NOTE — Telephone Encounter (Signed)
Phoned patient inquire about status. Patient reports burning hip pain has resolved without intervention. Denies being seen by PCP for evaluation of this patient. Patient reports he is scheduled to see Dr. Ardis Hughs his GI doctor today at 1345. Patient understands to contact this RN with any future needs. Patient expresses appreciation for the call.

## 2015-07-02 NOTE — Patient Instructions (Signed)
Follow up as needed for gastrointestinal issues.

## 2015-07-02 NOTE — Progress Notes (Signed)
Review of pertinent gastrointestinal problems: 1. Adenomatous colon polyps, colonoscopy January 2016 Dr. Ardis Hughs. Recommended recall colonoscopy at five-year interval. 2. Chronic intermittent abdominal pains, epigastric. December 2016 ultrasound showed essentially normal gallbladder. EGD Dr. Ardis Hughs 3 2017 showed mild gastritis, H. pylori negative, small HH. Very slow to awaken, felt this was due to underestimated CHF.  He was admitted to hosp 3-4 days later with chest pains, acute on chronic CHF.  HPI: This is a    very pleasant 65 year old man whom I last saw time of an upper endoscopy about 3 months ago  Chief complaint is shortness of breath when he ties his shoes, reaching for things, weak legs  He sees [name] from cardiology.  When he reaches for something or ties his shoes he gets SOB.  Has weak legs.  He doesn't think he has any digestive problems.  He has no constipation.  Takes narcotic pains meds for abd pains, aching all over, chest pains, hip pains.  No nausea or vomting.   Past Medical History  Diagnosis Date  . Hypertension   . GERD (gastroesophageal reflux disease)   . Hiatal hernia   . CHF (congestive heart failure) ([place])   . Cardiomyopathy ([place])     nonischemic   . COPD (chronic obstructive pulmonary disease) ([place])     previously listed in DC summary from Feb 2012   . Chronic kidney disease (CKD), stage II (mild)     listed in chart although patient denies   . Atrial fibrillation ([place])      previously listed in DC summary from August 31, 2011, ? atrial tachycardia   . SVT (supraventricular tachycardia) ([place])   . Heart murmur     "dx'd in middle school" (12/22/2012)  . Pneumonia     "once when I was a baby; probably 3-4 times since then" (12/22/2012)  . Shortness of breath     "all the time last couple months" (12/22/2012)  . [id number])     "weekly" (01/01/2013)  . Arthritis     "neck" (12/22/2012)  . Gout   . Depression   . Presence of permanent  cardiac pacemaker     st jude  . Prostate cancer ([place]) 12/13/14  . Hepatitis C     Past Surgical History  Procedure Laterality Date  . Tee without cardioversion N/A 05/11/2012    Procedure: TRANSESOPHAGEAL ECHOCARDIOGRAM (TEE);  Surgeon: Birdie Riddle, MD;  Location: [facility] ENDOSCOPY;  Service: Cardiovascular;  Laterality: N/A;  . Cardioversion N/A 05/11/2012    Procedure: Electrocardioversion, external;  Surgeon: Birdie Riddle, MD;  Location: [place];  Service: Cardiovascular;  Laterality: N/A;  . Cardioversion N/A 05/11/2012    Procedure: CARDIOVERSION AT THE BEDSIDE;  Surgeon: Birdie Riddle, MD;  Location: [place];  Service: Cardiovascular;  Laterality: N/A;  . Supraventricular tachycardia ablation  08/2011; 09/2011  . Cardiac catheterization  08/2011  . Left heart catheterization with coronary angiogram N/A 02/02/2011    Procedure: LEFT HEART CATHETERIZATION WITH CORONARY ANGIOGRAM;  Surgeon: Birdie Riddle, MD;  Location: [place] CATH LAB;  Service: Cardiovascular;  Laterality: N/A;  . Right heart catheterization N/A 09/10/2011    Procedure: RIGHT HEART CATH;  Surgeon: Jolaine Artist, MD;  Location[facility] C[facility] LAB;  Service: Cardiovascular;  Laterality: N/A;  . Supraventricular tachycardia ablation N/A 09/14/2011    Procedure: SUPRAVENTRICULAR TACHYCARDIA ABLATION;  Surgeon: Evans Lance, MD;  Location: [facility] CATH LAB;  Service: Cardiovascular;  Laterality: N/A;  . Supraventricular tachycardia ablation N/A  09/21/2011    Procedure: SUPRAVENTRICULAR TACHYCARDIA ABLATION;  Surgeon: Evans Lance, MD;  Location: [facility] CATH LAB;  Service: Cardiovascular;  Laterality: N/A;  . Temporary pacemaker insertion N/A 09/30/2013    Procedure: TEMPORARY PACEMAKER INSERTION;  Surgeon: Birdie Riddle, MD;  Location: [place] CATH LAB;  Service: Cardiovascular;  Laterality: N/A;  . Left heart catheterization with coronary angiogram  09/30/2013    Procedure: LEFT HEART CATHETERIZATION WITH CORONARY ANGIOGRAM;  Surgeon: Birdie Riddle, MD;  Location: [place] CATH LAB;  Service: Cardiovascular;;  . Bi-ventricular pacemaker insertion N/A 10/05/2013    Procedure: BI-VENTRICULAR PACEMAKER INSERTION (CRT-P);  Surgeon: Evans Lance, MD;  Location: [facility] CATH LAB;  Service: Cardiovascular;  Laterality: N/A;  . Colonoscopy with propofol N/A 03/15/2014    Procedure: COLONOSCOPY WITH PROPOFOL;  Surgeon: Milus Banister, MD;  Location: [facility] ENDOSCOPY;  Service: Endoscopy;  Laterality: N/A;    Current Outpatient Prescriptions  Medication Sig Dispense Refill  . acetaminophen (TYLENOL) 500 MG tablet Take 500 mg by mouth every 6 (six) hours as needed for moderate pain or headache.    . allopurinol (ZYLOPRIM) 100 MG tablet Take 100 mg by mouth daily.     Marland Kitchen amiodarone (PACERONE) 200 MG tablet Take 1 tablet (200 mg total) by mouth daily. 30 tablet 3  . calcium carbonate (TUMS EX) 750 MG chewable tablet Chew 1 tablet by mouth daily as needed for heartburn.    . carvedilol (COREG) 12.5 MG tablet Take 1 tablet (12.5 mg total) by mouth 2 (two) times daily. 60 tablet 3  . Digoxin 62.5 MCG TABS Take 0.0625 mg by mouth daily. 30 tablet 3  . Edoxaban Tosylate (SAVAYSA) 30 MG TABS Take 30 mg by mouth daily.     . furosemide (LASIX) 40 MG tablet Take 40 mg by mouth daily.     . isosorbide-hydrALAZINE (BIDIL) 20-37.5 MG tablet Take 1 tablet by mouth 2 (two) times daily. 60 tablet 3  . Multiple Vitamin (MULTIVITAMIN WITH MINERALS) TABS tablet Take 0.5 tablets by mouth 2 (two) times daily.     Marland Kitchen OVER THE COUNTER MEDICATION Take 1 capsule by mouth daily. [place]    . oxyCODONE (OXY IR/ROXICODONE) 5 MG immediate release tablet Take 5 mg by mouth 2 (two) times daily as needed for moderate pain or severe pain.    . pantoprazole (PROTONIX) 40 MG tablet Take 40 mg by mouth daily.  1  . pravastatin (PRAVACHOL) 40 MG tablet Take 40 mg by mouth daily.    . ranitidine (ZANTAC) 150 MG tablet Take 150 mg by mouth 2 (two) times daily.  3  . sacubitril-valsartan  (ENTRESTO) 24-26 MG Take 1 tablet by mouth 2 (two) times daily. 60 tablet 3   No current facility-administered medications for this visit.    Allergies as of 07/02/2015 - Review Complete 07/02/2015  Allergen Reaction Noted  . Ibuprofen Other (See Comments) 02/13/2014  . Lopressor [metoprolol tartrate] Other (See Comments) 05/24/2015    Family History  Problem Relation Age of Onset  . Lung cancer Mother     passed away from lung cancer  . Heart failure Mother     CHF  . Pneumonia Father     passed sway from PNA.  Marland Kitchen Alcohol abuse Father   . Stroke Sister   . Diabetes Sister     1/2 sister  . Seizures Brother     x 2 brothers  . Liver cancer Brother     mets, Hep C, kidney disease  .  Prostate cancer Paternal Grandfather   . Colon cancer Maternal Aunt   . Colon polyps Cousin   . Other Maternal Aunt     brain tumor    Social History   Social History  . Marital Status: Single    Spouse Name: N/A  . Number of Children: 2  . Years of Education: N/A   Occupational History  . auto body Mechanic/painter    Social History Main Topics  . Smoking status: Former Smoker -- 0.25 packs/day for 15 years    Types: Cigarettes    Quit date: 07/17/2011  . Smokeless tobacco: Never Used  . Alcohol Use: 3.6 oz/week    6 Cans of beer per week     Comment: 12/22/2012 1, ~ 24oz beer q other day at most"  . Drug Use: Yes    Special: Cocaine, Marijuana     Comment: 12/22/2012 "Cocaine and Marijuana in 1990's and stopped all drug abuse in 2003"  . Sexual Activity: Not Currently   Other Topics Concern  . Not on file   Social History Narrative     Physical Exam: BP 114/84 mmHg  Pulse 60  Ht 5\' 7"  (1.702 m)  Wt 149 lb 4 oz (67.699 kg)  BMI 23.37 kg/m2 Constitutional: generally well-appearing Psychiatric: alert and oriented x3 Abdomen: soft, nontender, nondistended, no obvious ascites, no peritoneal signs, normal bowel sounds   Assessment and plan: 65 y.o. male with  Intermittent abdominal discomforts  Ultrasound, upper endoscopy, recent colonoscopy showed no clear etiology for his mild intermittent discomforts. I recommended he continue on daily antiacid medicines. His bigger complaints are that of weakness, chest pains, shortness of breath when he moves around. He has explained these to his cardiologist who is aware of all this very symptoms. I see no reason for further GI testing at this point he knows to call if he has any new or, worsening symptoms.   Owens Loffler, MD [facility] 07/02/2015, 2:02 PM

## 2015-07-04 ENCOUNTER — Ambulatory Visit: Payer: Medicare HMO | Admitting: Radiation Oncology

## 2015-07-08 ENCOUNTER — Ambulatory Visit
Admission: RE | Admit: 2015-07-08 | Discharge: 2015-07-08 | Disposition: A | Discharge status: Home / Self Care | Payer: Medicare HMO | Source: Ambulatory Visit | Attending: Radiation Oncology | Admitting: Radiation Oncology

## 2015-07-08 ENCOUNTER — Ambulatory Visit
Admission: RE | Admit: 2015-07-08 | Discharge: 2015-07-08 | Disposition: A | Discharge status: Home / Self Care | Payer: Medicare HMO | Source: Ambulatory Visit | Attending: Cardiovascular Disease | Admitting: Cardiovascular Disease

## 2015-07-08 ENCOUNTER — Other Ambulatory Visit: Payer: Self-pay | Admitting: Cardiovascular Disease

## 2015-07-08 ENCOUNTER — Encounter: Payer: Self-pay | Admitting: Radiation Oncology

## 2015-07-08 VITALS — BP 103/61 | HR 73 | Temp 97.9°F | Resp 20 | Ht 67.0 in | Wt 147.0 lb

## 2015-07-08 DIAGNOSIS — R9721 Rising PSA following treatment for malignant neoplasm of prostate: Secondary | ICD-10-CM | POA: Insufficient documentation

## 2015-07-08 DIAGNOSIS — Z7901 Long term (current) use of anticoagulants: Secondary | ICD-10-CM | POA: Insufficient documentation

## 2015-07-08 DIAGNOSIS — Z923 Personal history of irradiation: Secondary | ICD-10-CM | POA: Insufficient documentation

## 2015-07-08 DIAGNOSIS — I509 Heart failure, unspecified: Secondary | ICD-10-CM | POA: Diagnosis not present

## 2015-07-08 DIAGNOSIS — R918 Other nonspecific abnormal finding of lung field: Secondary | ICD-10-CM | POA: Insufficient documentation

## 2015-07-08 DIAGNOSIS — C61 Malignant neoplasm of prostate: Secondary | ICD-10-CM | POA: Diagnosis present

## 2015-07-08 DIAGNOSIS — R0602 Shortness of breath: Secondary | ICD-10-CM

## 2015-07-08 NOTE — Progress Notes (Signed)
Follow up s/p prostate rad txs 04/02/15-05/28/15, had cxray today, results in, and had echocardiogram today, saw Heart MD also, sob mild, states coughing up blood with nose bleeding,phelgm streaks blod once time blood clot 1 week ago, appetite  Good, only eats when he is hungry,eats twice daily, energy level better, legs still weak satted 3:39 PM Wt Readings from Last 3 Encounters:  07/08/15 147 lb (66.679 kg)  07/02/15 149 lb 4 oz (67.699 kg)  05/25/15 153 lb 11.2 oz (69.718 kg)   BP 103/61 mmHg  Pulse 73  Temp(Src) 97.9 F (36.6 C) (Oral)  Resp 20  Ht 5\' 7"  (1.702 m)  Wt 147 lb (66.679 kg)  BMI 23.02 kg/m2  SpO2 98%

## 2015-07-08 NOTE — Progress Notes (Signed)
Radiation Oncology         [phone] ________________________________  Name: [name] MRN: [id number]  Date: 07/08/2015  DOB: 1950-06-03  Follow-Up Visit Note  CC: [name], MD  [name], MD  Diagnosis:   Stage T1c adenocarcinoma of the prostate with a Gleason's score of 3+4 and a PSA of 8.08  Interval Since Last Radiation:  6 weeks  04/02/2015-05/28/2015:The prostate was treated to 78 Gy in 40 fractions of 1.95 Gy  Narrative:  The patient returns today for routine follow-up. He tolerated radiotherapy well however did experience significant trouble with abdominal pain and congestive heart failure. He did undergo endoscopy which did not reveal any concerns for abnormalities that would attribute to his pain, he also had a abdominal ultrasound that did not reveal any concerns for gallbladder disease. He has recently been experiencing episodes of shortness of breath and his primary cardiologist continues to follow him closely. A chest x-ray today revealed some change that appears to be consistent with infiltrates, an echocardiogram is being read.  On review of systems, the patient reports that he is doing better. He is not having abdominal pain currently and denies any diarrhea. He intermittently experiences constipation but states that this is not present currently. He has been noticing several episodes of nosebleeds, and following he will cough up a little bit of blood. He denies any hemoptysis outside of these episodes. He denies any shortness of breath or chest pain currently. He denies any fevers or chills. He is not experiencing any dysuria, hematuria, urgency or frequency. A complete review of systems is obtained and is otherwise negative  ALLERGIES:  is allergic to ibuprofen and lopressor.  Meds: Current Outpatient Prescriptions  Medication Sig Dispense Refill  . acetaminophen (TYLENOL) 500 MG tablet Take 500 mg by mouth every 6 (six) hours as needed for  moderate pain or headache.    . allopurinol (ZYLOPRIM) 100 MG tablet Take 100 mg by mouth daily.     Marland Kitchen amiodarone (PACERONE) 200 MG tablet Take 1 tablet (200 mg total) by mouth daily. 30 tablet 3  . calcium carbonate (TUMS EX) 750 MG chewable tablet Chew 1 tablet by mouth daily as needed for heartburn.    . carvedilol (COREG) 12.5 MG tablet Take 1 tablet (12.5 mg total) by mouth 2 (two) times daily. 60 tablet 3  . Edoxaban Tosylate (SAVAYSA) 30 MG TABS Take 30 mg by mouth daily.     . furosemide (LASIX) 40 MG tablet Take 40 mg by mouth daily.     . isosorbide-hydrALAZINE (BIDIL) 20-37.5 MG tablet Take 1 tablet by mouth 2 (two) times daily. 60 tablet 3  . Multiple Vitamin (MULTIVITAMIN WITH MINERALS) TABS tablet Take 0.5 tablets by mouth 2 (two) times daily.     Marland Kitchen oxyCODONE (OXY IR/ROXICODONE) 5 MG immediate release tablet Take 5 mg by mouth 2 (two) times daily as needed for moderate pain or severe pain.    . pantoprazole (PROTONIX) 40 MG tablet Take 40 mg by mouth daily.  1  . pravastatin (PRAVACHOL) 40 MG tablet Take 40 mg by mouth daily.    . Digoxin 62.5 MCG TABS Take 0.0625 mg by mouth daily. (Patient not taking: Reported on 07/08/2015) 30 tablet 3  . OVER THE COUNTER MEDICATION Take 1 capsule by mouth daily. Reported on 07/08/2015    . ranitidine (ZANTAC) 150 MG tablet Take 150 mg by mouth 2 (two) times daily. Reported on 07/08/2015  3  . sacubitril-valsartan (ENTRESTO)  24-26 MG Take 1 tablet by mouth 2 (two) times daily. (Patient not taking: Reported on 07/08/2015) 60 tablet 3   No current facility-administered medications for this encounter.    Physical Findings:  height is 5\' 7"  (1.702 m) and weight is 147 lb (66.679 kg). His oral temperature is 97.9 F (36.6 C). His blood pressure is 103/61 and his pulse is 73. His respiration is 20 and oxygen saturation is 98%. .   In general this is a well appearing African-American male in no acute distress. He's alert and oriented x4 and appropriate  throughout the examination. Cardiopulmonary assessment is negative for acute distress and he exhibits normal effort. HEENT reveals that he has normocephalic atraumatic, EOMs are intact. Bilateral nares are intact without evidence of active bleeding from the septum.   Lab Findings: Lab Results  Component Value Date   WBC 4.6 05/25/2015   HGB 13.8 05/25/2015   HCT 41.3 05/25/2015   MCV 101.2* 05/25/2015   PLT 191 05/25/2015     Radiographic Findings: Dg Chest 2 View  07/08/2015  CLINICAL DATA:  Increasing shortness of breath and dry cough EXAM: CHEST  2 VIEW COMPARISON:  05/20/2015 FINDINGS: Pacing device is again seen and stable. The cardiac shadow is mildly enlarged but stable. Patchy infiltrate is noted in the left lung base new from the prior exam. No sizable effusion is seen. No other focal abnormality is noted. IMPRESSION: Patchy infiltrates in the left base. Follow-up examination is recommended following appropriate therapy. Electronically Signed   By: Inez Catalina M.D.   On: 07/08/2015 15:10    Impression/Plan: 1. Intermediate Risk, stage T1c adenocarcinoma of the prostate with a Gleason's Score of 3+4 and a PSA of 8.08. The patient did well during the course of his radiotherapy in terms of side effects from our treatment, he did experience some issues however with congestive heart failure and following completion of his radiotherapy, to get admitted for inpatient management of this. With regard to his prostate cancer however we discussed the rationale for close follow-up, and he will be seen by [name]. We will set of this follow-up to occur in July. He states agreement and understanding. We will be happy to see him back in the future if he has any additional radiotherapy needs. Otherwise, we will be available as needed. 2. Congestive heart failure. The patient will continue to follow up with his primary cardiologist for further intervention. He will be awaiting the results of his  chest x-ray and echocardiogram results as well. 3. Epistasis. This appears to be due to dry the patient's been experiencing, and given the fact that he is on chronic anticoagulation. I've given him samples of Aquaphor to use on bilaterally nares, and he will continue follow-up with his primary provider for this. 4. Survivor Psychologist, sport and exercise. The patient is counseled on the role of survivor set for prostate cancer. He is not interested in moving forward with this but would be agreeable to receive a copy of this survivorship care plan by mail.     Carola Rhine, PAC

## 2015-07-09 ENCOUNTER — Telehealth: Payer: Self-pay | Admitting: *Deleted

## 2015-07-09 NOTE — Addendum Note (Signed)
Encounter addended by: Doreen Beam, RN on: 07/09/2015  9:52 AM<BR>     Documentation filed: Charges VN

## 2015-07-09 NOTE — Telephone Encounter (Signed)
CALLED PATIENT TO INFORM OF FU WITH DR. Alyson Ingles ON 08-30-15- ARRIVAL TIME - 2:15 PM, SPOKE WITH PATIENT AND HE IS AWARE OF THIS APPT.

## 2015-07-09 NOTE — Telephone Encounter (Signed)
PATIENT TO HAVE AN APPT. WITH DR. Alyson Ingles ON 08-30-15- ARRIVAL TIME - 2:15 PM

## 2015-07-15 ENCOUNTER — Emergency Department (HOSPITAL_COMMUNITY)
Admission: EM | Admit: 2015-07-15 | Discharge: 2015-07-18 | Disposition: E | Payer: Medicare HMO | Attending: Emergency Medicine | Admitting: Emergency Medicine

## 2015-07-15 DIAGNOSIS — Z9889 Other specified postprocedural states: Secondary | ICD-10-CM | POA: Diagnosis not present

## 2015-07-15 DIAGNOSIS — I469 Cardiac arrest, cause unspecified: Secondary | ICD-10-CM | POA: Insufficient documentation

## 2015-07-15 DIAGNOSIS — Z8701 Personal history of pneumonia (recurrent): Secondary | ICD-10-CM | POA: Diagnosis not present

## 2015-07-15 DIAGNOSIS — Z8546 Personal history of malignant neoplasm of prostate: Secondary | ICD-10-CM | POA: Diagnosis not present

## 2015-07-15 DIAGNOSIS — Z8659 Personal history of other mental and behavioral disorders: Secondary | ICD-10-CM | POA: Diagnosis not present

## 2015-07-15 DIAGNOSIS — J449 Chronic obstructive pulmonary disease, unspecified: Secondary | ICD-10-CM | POA: Insufficient documentation

## 2015-07-15 DIAGNOSIS — R011 Cardiac murmur, unspecified: Secondary | ICD-10-CM | POA: Diagnosis not present

## 2015-07-15 DIAGNOSIS — N183 Chronic kidney disease, stage 3 (moderate): Secondary | ICD-10-CM | POA: Diagnosis not present

## 2015-07-15 DIAGNOSIS — I4891 Unspecified atrial fibrillation: Secondary | ICD-10-CM | POA: Diagnosis not present

## 2015-07-15 DIAGNOSIS — K219 Gastro-esophageal reflux disease without esophagitis: Secondary | ICD-10-CM | POA: Insufficient documentation

## 2015-07-15 DIAGNOSIS — M1990 Unspecified osteoarthritis, unspecified site: Secondary | ICD-10-CM | POA: Diagnosis not present

## 2015-07-15 DIAGNOSIS — Z79899 Other long term (current) drug therapy: Secondary | ICD-10-CM | POA: Insufficient documentation

## 2015-07-15 DIAGNOSIS — Z950 Presence of cardiac pacemaker: Secondary | ICD-10-CM | POA: Diagnosis not present

## 2015-07-15 DIAGNOSIS — Z87891 Personal history of nicotine dependence: Secondary | ICD-10-CM | POA: Insufficient documentation

## 2015-07-15 DIAGNOSIS — I129 Hypertensive chronic kidney disease with stage 1 through stage 4 chronic kidney disease, or unspecified chronic kidney disease: Secondary | ICD-10-CM | POA: Diagnosis not present

## 2015-07-15 DIAGNOSIS — M109 Gout, unspecified: Secondary | ICD-10-CM | POA: Insufficient documentation

## 2015-07-15 LAB — CBG MONITORING, ED: Glucose-Capillary: 160 mg/dL — ABNORMAL HIGH (ref 65–99)

## 2015-07-15 MED ORDER — EPINEPHRINE HCL 0.1 MG/ML IJ SOSY
PREFILLED_SYRINGE | INTRAMUSCULAR | Status: AC | PRN
  Administered 2015-07-15 (×5): 1 via INTRAVENOUS

## 2015-07-15 MED FILL — Medication: Qty: 1 | Status: AC

## 2015-07-18 NOTE — ED Notes (Signed)
See code sheet.

## 2015-07-18 NOTE — ED Provider Notes (Signed)
CSN: [id number]     Arrival date & time 07-20-15  1235 History   First MD Initiated Contact with Patient 20-Jul-2015 1358     Chief Complaint  Patient presents with  . Cardiac Arrest     (Consider location/radiation/quality/duration/timing/severity/associated sxs/prior Treatment) HPI Comments: 65 year old male with, located past medical history including coronary artery disease, prostate cancer status post radiation presents as a CPR in progress. The patient reportedly had wanted to get some fresh air with his wife earlier today and went out for lunch. Following lunch they were returning home and the patient suddenly became weak getting out of the car. The patient's wife was able to help him inside but the patient again collapsed onto the floor. She said he was not breathing and did not have a pulse and she started bystander compressions. She called EMS and on their arrival the patient still did not have a pulse and was in PE a arrest. CPR was continued and the patient was brought to the emergency department. Per EMS on arrival to the emergency department the patient had been down for 1 hour. There had never been any return of pulses.   Past Medical History  Diagnosis Date  . Hypertension   . GERD (gastroesophageal reflux disease)   . Hiatal hernia   . CHF (congestive heart failure) ([place])   . Cardiomyopathy ([place])     nonischemic   . COPD (chronic obstructive pulmonary disease) ([place])     previously listed in DC summary from Feb 2012   . Chronic kidney disease (CKD), stage II (mild)     listed in chart although patient denies   . Atrial fibrillation ([place])      previously listed in DC summary from August 31, 2011, ? atrial tachycardia   . SVT (supraventricular tachycardia) ([place])   . Heart murmur     "dx'd in middle school" (12/22/2012)  . Pneumonia     "once when I was a baby; probably 3-4 times since then" (12/22/2012)  . Shortness of breath     "all the time last couple months" (12/22/2012)   . [id number])     "weekly" (01/01/2013)  . Arthritis     "neck" (12/22/2012)  . Gout   . Depression   . Presence of permanent cardiac pacemaker     st jude  . Prostate cancer ([place]) 12/13/14  . Hepatitis C    Past Surgical History  Procedure Laterality Date  . Tee without cardioversion N/A 05/11/2012    Procedure: TRANSESOPHAGEAL ECHOCARDIOGRAM (TEE);  Surgeon: Birdie Riddle, MD;  Location: [facility] ENDOSCOPY;  Service: Cardiovascular;  Laterality: N/A;  . Cardioversion N/A 05/11/2012    Procedure: Electrocardioversion, external;  Surgeon: Birdie Riddle, MD;  Location: [place];  Service: Cardiovascular;  Laterality: N/A;  . Cardioversion N/A 05/11/2012    Procedure: CARDIOVERSION AT THE BEDSIDE;  Surgeon: Birdie Riddle, MD;  Location: [place];  Service: Cardiovascular;  Laterality: N/A;  . Supraventricular tachycardia ablation  08/2011; 09/2011  . Cardiac catheterization  08/2011  . Left heart catheterization with coronary angiogram N/A 02/02/2011    Procedure: LEFT HEART CATHETERIZATION WITH CORONARY ANGIOGRAM;  Surgeon: Birdie Riddle, MD;  Location: [place] CATH LAB;  Service: Cardiovascular;  Laterality: N/A;  . Right heart catheterization N/A 09/10/2011    Procedure: RIGHT HEART CATH;  Surgeon: Jolaine Artist, MD;  Location: [facility] CATH LAB;  Service: Cardiovascular;  Laterality: N/A;  . Supraventricular tachycardia ablation N/A 09/14/2011    Procedure: SUPRAVENTRICULAR TACHYCARDIA  ABLATION;  Surgeon: Evans Lance, MD;  Location: [facility] CATH LAB;  Service: Cardiovascular;  Laterality: N/A;  . Supraventricular tachycardia ablation N/A 09/21/2011    Procedure: SUPRAVENTRICULAR TACHYCARDIA ABLATION;  Surgeon: Evans Lance, MD;  Location: [facility] CATH LAB;  Service: Cardiovascular;  Laterality: N/A;  . Temporary pacemaker insertion N/A 09/30/2013    Procedure: TEMPORARY PACEMAKER INSERTION;  Surgeon: Birdie Riddle, MD;  Location: [place] CATH LAB;  Service: Cardiovascular;  Laterality: N/A;  . Left heart  catheterization with coronary angiogram  09/30/2013    Procedure: LEFT HEART CATHETERIZATION WITH CORONARY ANGIOGRAM;  Surgeon: Birdie Riddle, MD;  Location: [place] CATH LAB;  Service: Cardiovascular;;  . Bi-ventricular pacemaker insertion N/A 10/05/2013    Procedure: BI-VENTRICULAR PACEMAKER INSERTION (CRT-P);  Surgeon: Evans Lance, MD;  Location: [facility] CATH LAB;  Service: Cardiovascular;  Laterality: N/A;  . Colonoscopy with propofol N/A 03/15/2014    Procedure: COLONOSCOPY WITH PROPOFOL;  Surgeon: Milus Banister, MD;  Location: [facility] ENDOSCOPY;  Service: Endoscopy;  Laterality: N/A;   Family History  Problem Relation Age of Onset  . Lung cancer Mother     passed away from lung cancer  . Heart failure Mother     CHF  . Pneumonia Father     passed sway from PNA.  Marland Kitchen Alcohol abuse Father   . Stroke Sister   . Diabetes Sister     1/2 sister  . Seizures Brother     x 2 brothers  . Liver cancer Brother     mets, Hep C, kidney disease  . Prostate cancer Paternal Grandfather   . Colon cancer Maternal Aunt   . Colon polyps Cousin   . Other Maternal Aunt     brain tumor   Social History  Substance Use Topics  . Smoking status: Former Smoker -- 0.25 packs/day for 15 years    Types: Cigarettes    Quit date: 07/17/2011  . Smokeless tobacco: Never Used  . Alcohol Use: 3.6 oz/week    6 Cans of beer per week     Comment: 12/22/2012 1, ~ 24oz beer q other day at most"    Review of Systems  Unable to perform ROS: Acuity of condition      Allergies  Ibuprofen and Lopressor  Home Medications   Prior to Admission medications   Medication Sig Start Date End Date Taking? Authorizing Provider  acetaminophen (TYLENOL) 500 MG tablet Take 500 mg by mouth every 6 (six) hours as needed for moderate pain or headache.    Historical Provider, MD  allopurinol (ZYLOPRIM) 100 MG tablet Take 100 mg by mouth daily.  07/21/13   Historical Provider, MD  amiodarone (PACERONE) 200 MG tablet Take 1 tablet (200  mg total) by mouth daily. 01/23/15   Dixie Dials, MD  calcium carbonate (TUMS EX) 750 MG chewable tablet Chew 1 tablet by mouth daily as needed for heartburn.    Historical Provider, MD  carvedilol (COREG) 12.5 MG tablet Take 1 tablet (12.5 mg total) by mouth 2 (two) times daily. 01/23/15   Dixie Dials, MD  Digoxin 62.5 MCG TABS Take 0.0625 mg by mouth daily. Patient not taking: Reported on 07/08/2015 05/25/15   [name], MD  Edoxaban Tosylate (SAVAYSA) 30 MG TABS Take 30 mg by mouth daily.     Historical Provider, MD  furosemide (LASIX) 40 MG tablet Take 40 mg by mouth daily.     Historical Provider, MD  isosorbide-hydrALAZINE (BIDIL) 20-37.5 MG tablet Take 1 tablet  by mouth 2 (two) times daily. 05/25/15   [name], MD  Multiple Vitamin (MULTIVITAMIN WITH MINERALS) TABS tablet Take 0.5 tablets by mouth 2 (two) times daily.     Historical Provider, MD  OVER THE COUNTER MEDICATION Take 1 capsule by mouth daily. Reported on 07/08/2015    Historical Provider, MD  oxyCODONE (OXY IR/ROXICODONE) 5 MG immediate release tablet Take 5 mg by mouth 2 (two) times daily as needed for moderate pain or severe pain.    Historical Provider, MD  pantoprazole (PROTONIX) 40 MG tablet Take 40 mg by mouth daily. 03/13/15   Historical Provider, MD  pravastatin (PRAVACHOL) 40 MG tablet Take 40 mg by mouth daily. 05/19/15   Historical Provider, MD  ranitidine (ZANTAC) 150 MG tablet Take 150 mg by mouth 2 (two) times daily. Reported on 07/08/2015 01/08/15   Historical Provider, MD  sacubitril-valsartan (ENTRESTO) 24-26 MG Take 1 tablet by mouth 2 (two) times daily. Patient not taking: Reported on 07/08/2015 05/25/15   [name], MD   Wt 147 lb (66.679 kg) Physical Exam  Constitutional: He appears distressed.  HENT:  Head: Normocephalic and atraumatic.  Eyes: Conjunctivae are normal. Right eye exhibits no discharge. Left eye exhibits no discharge.  Neck: Neck supple. No JVD present.  Cardiovascular:  Pulseless, PA on  monitor  Pulmonary/Chest:  King airway in place, coarse breath sounds  Abdominal: Soft. He exhibits no distension and no mass.  Musculoskeletal: He exhibits no edema.  Neurological:  Patient unresponsive without any muscle tone  Skin: No rash noted. He is not diaphoretic. No pallor.    ED Course  Procedures (including critical care time)  CRITICAL CARE Performed by: Earlie Server   Total critical care time: 45 minutes  Critical care time was exclusive of separately billable procedures and treating other patients.  Critical care was necessary to treat or prevent imminent or life-threatening deterioration.  Critical care was time spent personally by me on the following activities: development of treatment plan with patient and/or surrogate as well as nursing, discussions with consultants, evaluation of patient's response to treatment, examination of patient, obtaining history from patient or surrogate, ordering and performing treatments and interventions, ordering and review of laboratory studies, ordering and review of radiographic studies, pulse oximetry and re-evaluation of patient's condition.  Labs Review Labs Reviewed  CBG MONITORING, ED - Abnormal; Notable for the following:    Glucose-Capillary 160 (*)    All other components within normal limits    Imaging Review No results found. I have personally reviewed and evaluated these images and lab results as part of my medical decision-making.   EKG Interpretation None      MDM  Patient was seen and evaluated immediately at bedside. Patient came in as a CPR in progress. CPR was continued per ACLS protocol. Patient had been down for an hour prior to arrival to the emergency department. CPR was continued for about 15 minutes in the emergency department without any ROSC.  At this time patient did not have any cardiac movement on bedside ultrasound and the decision was made to discontinue CPR. All staff in the room were in  agreement. We had discussed other options for treatment at this time it was felt that there were no other treatments that could be used to help for intervention. I updated the wife and the conference room with the chaplain. I told her of the patient's death. I gave her the opportunity to ask any questions. I also reached out to  Dr. Jimmye Norman office regarding the death certificate. I did not hear back from them. At this time with the patient's multiple medical problems it does not appear that he will need an ME evaluation and the wife did not want one unless necessary. Final diagnoses:  None    1. Cardiac arrest    Harvel Quale, MD 07/19/15 1719

## 2015-07-18 NOTE — ED Notes (Signed)
Wife taken to pt's bedside-- son is on the way from [place]-- wife would like to stay here until son has achance

## 2015-07-18 NOTE — Code Documentation (Signed)
Family updated as to patient's status.

## 2015-07-18 DEATH — deceased

## 2015-08-17 DEATH — deceased

## 2016-05-30 IMAGING — MR MR HEAD W/O CM
10 of 11 series · 28 of 48 positions shown · non-contrast
Comparison: None.

CLINICAL DATA: Syncope

EXAM:
MRI HEAD WITHOUT CONTRAST
TECHNIQUE: Multiplanar, multiecho pulse sequences of the brain and surrounding
structures were obtained without intravenous contrast.

[Series 2: FLAIR · sagittal · 5.0mm · 0.47mm/px · 1 of 25 slices shown (1 of 2)]
[im 1/25]
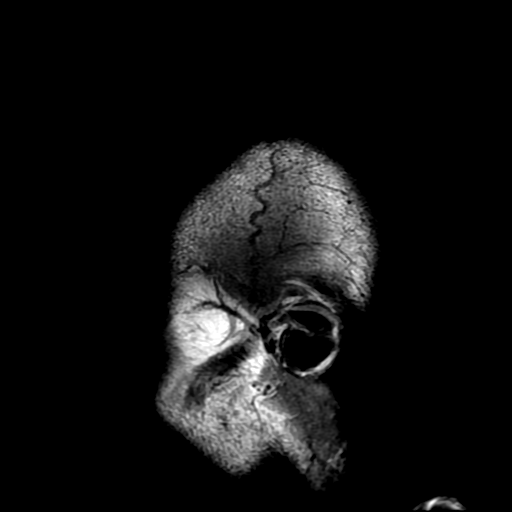

[Series 4: DWI · axial · 5.0mm · 1.02mm/px · z∈[-111,+27]mm · 4 of 62 slices shown (1 of 4)]
[im 1/62]
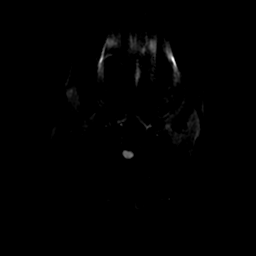
[im 21/62]
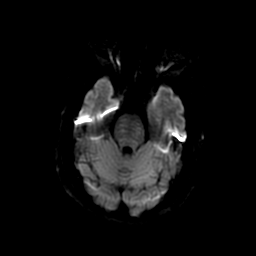
[im 41/62]
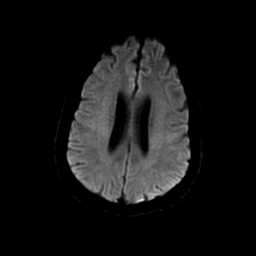
[im 62/62]
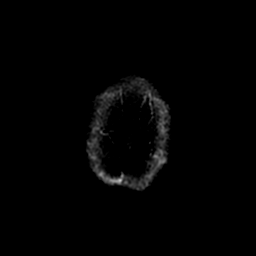

[Series 5: T2 · axial · 5.0mm · 0.43mm/px · z∈[-111,+35]mm · 2 of 27 slices shown (1 of 2)]
[im 1/27]
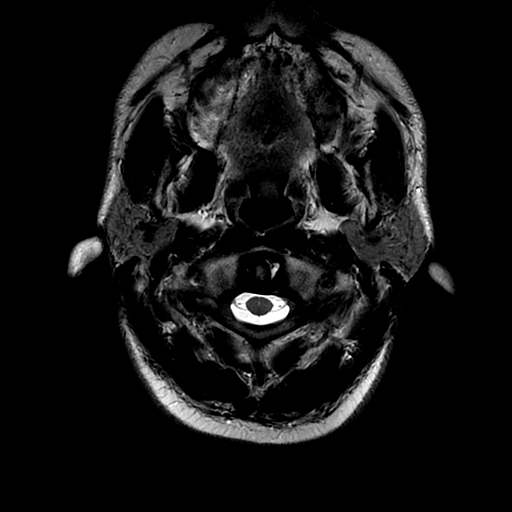
[im 27/27]
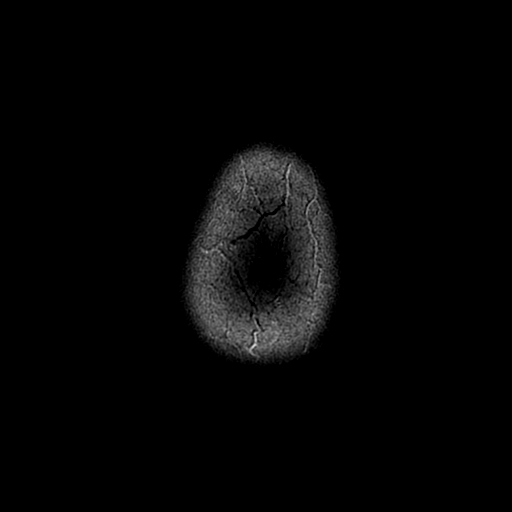

[Series 6: FLAIR · axial · 5.0mm · 0.47mm/px · z∈[-115,+31]mm · 2 of 27 slices shown (2 of 2)]
[im 1/27]
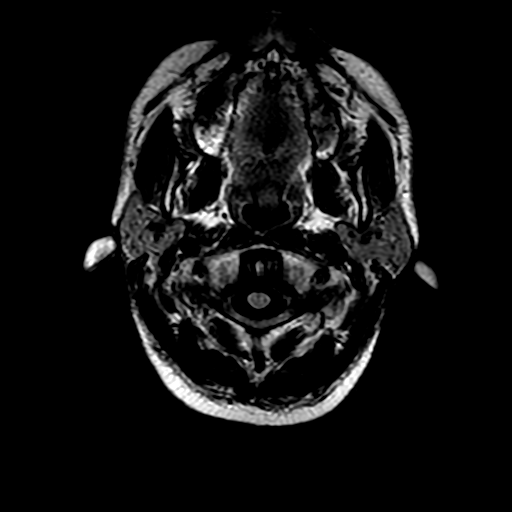
[im 27/27]
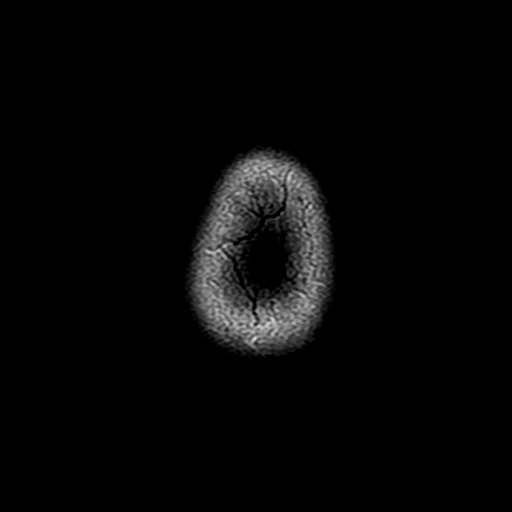

[Series 7: DWI · coronal · 5.0mm · 1.02mm/px · 4 of 72 slices shown (2 of 4)]
[im 1/72]
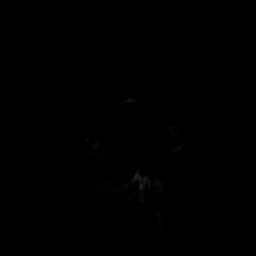
[im 24/72]
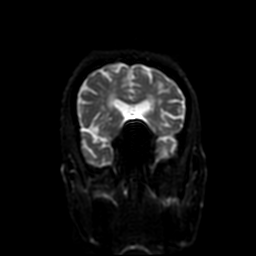
[im 48/72]
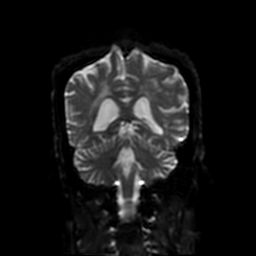
[im 72/72]
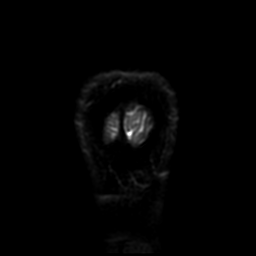

[Series 8: (person_name) · axial · 3.6mm · 0.47mm/px · z∈[-95,+57]mm · 8 of 176 slices shown]
[im 1/176]
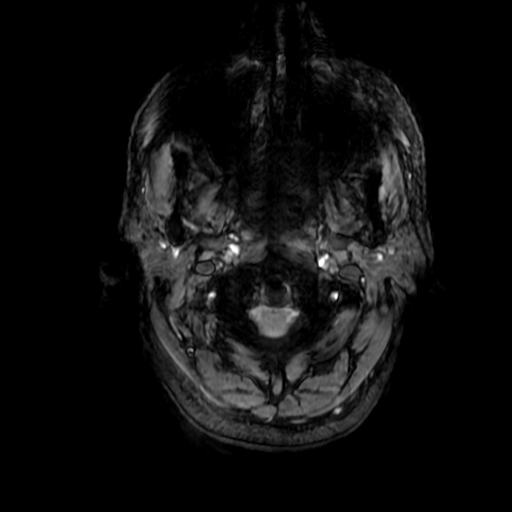
[im 36/176]
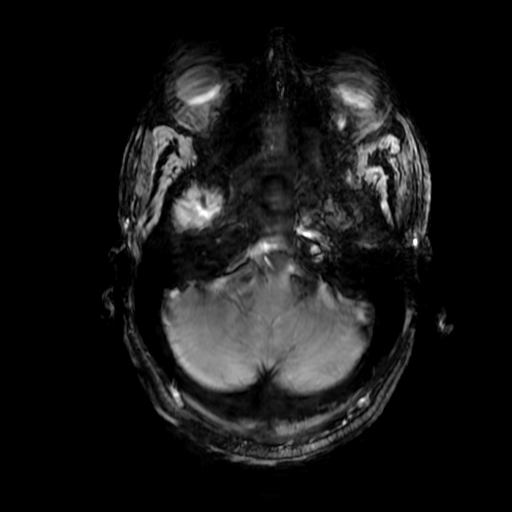
[im 53/176]
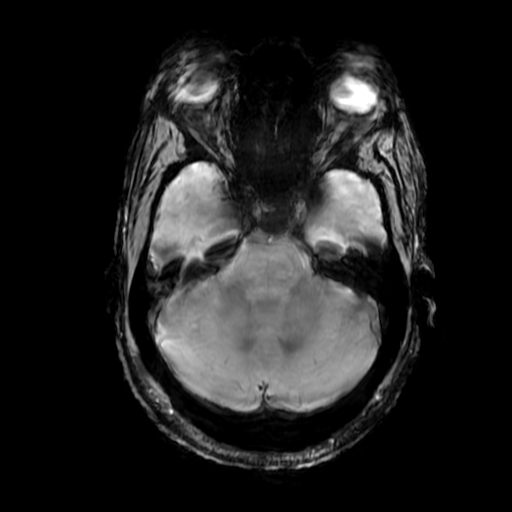
[im 71/176]
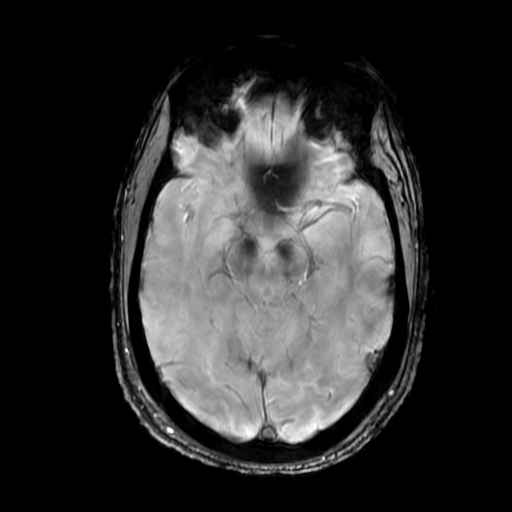
[im 106/176]
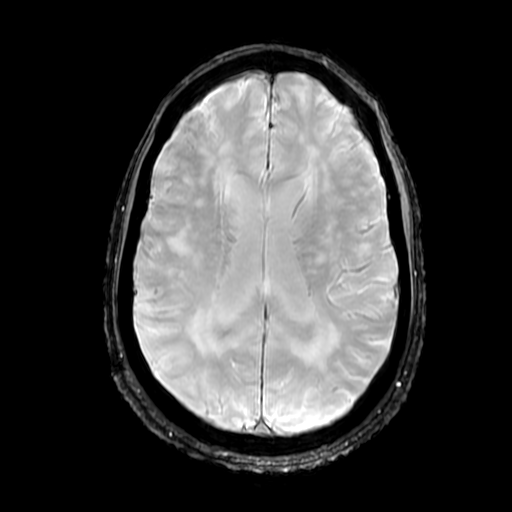
[im 123/176]
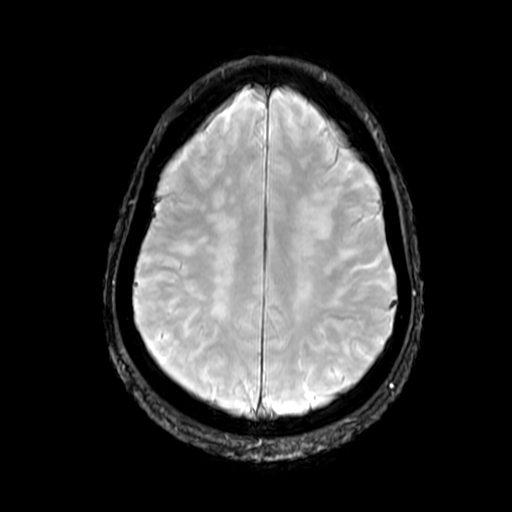
[im 141/176]
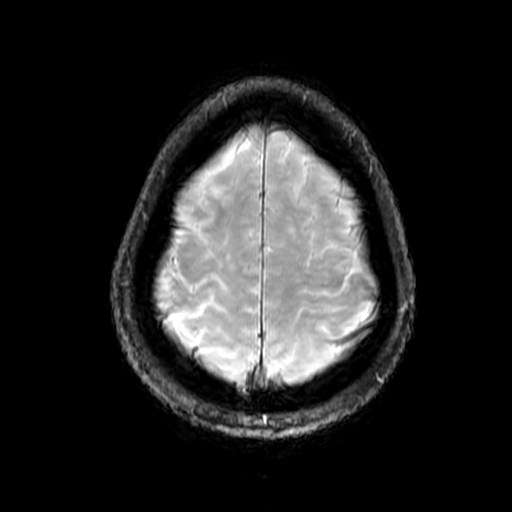
[im 176/176]
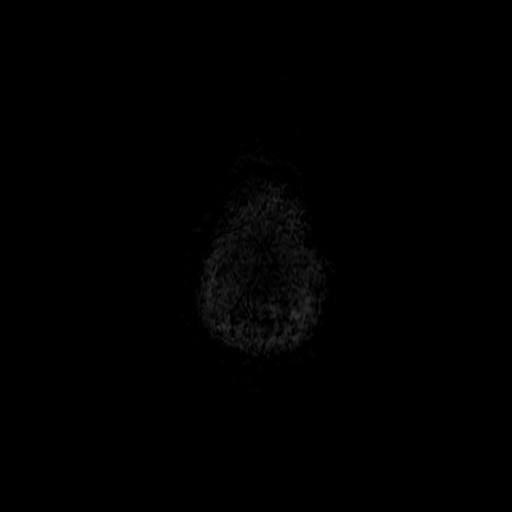

[Series 10: T2 · coronal · 5.0mm · 0.47mm/px · 2 of 31 slices shown (2 of 2)]
[im 1/31]
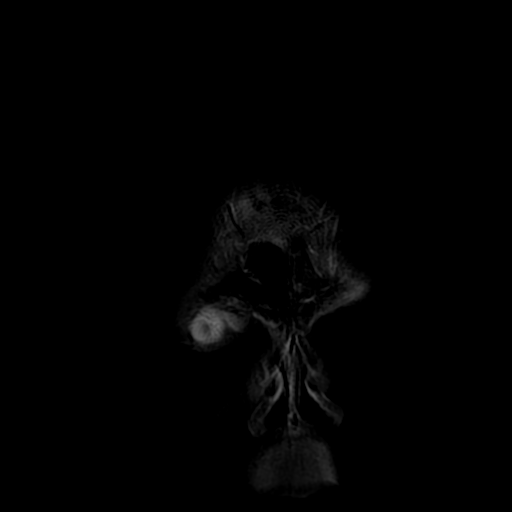
[im 31/31]
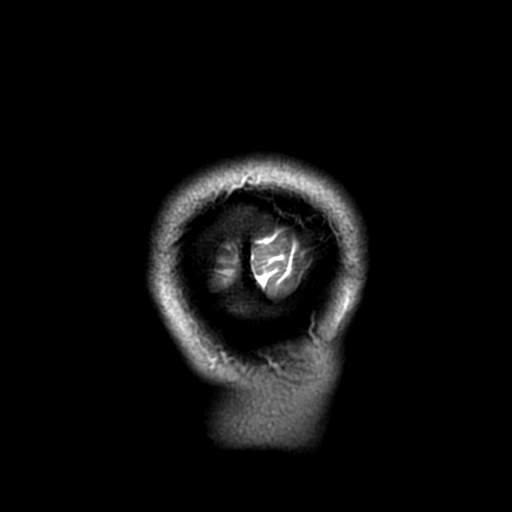

[Series 400: DWI · axial · 5.0mm · 1.02mm/px · z∈[-111,+27]mm · 2 of 31 slices shown (3 of 4)]
[im 1/31]
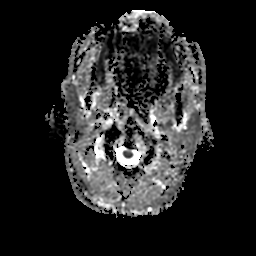
[im 31/31]
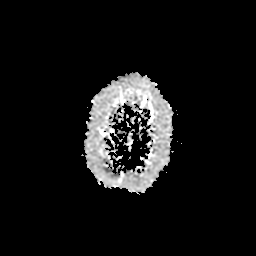

[Series 700: DWI · coronal · 5.0mm · 1.02mm/px · 2 of 36 slices shown (4 of 4)]
[im 1/36]
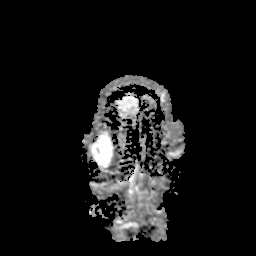
[im 36/36]
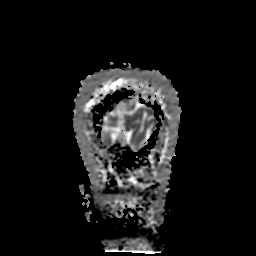

[Series 800: filt_pha: (person_name) · axial · 3.6mm · 0.47mm/px · 1 of 176 slices shown]
[im 1/176]
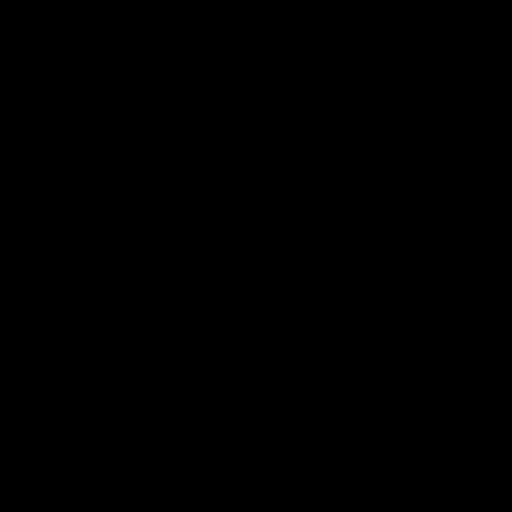

[28 of 48 positions shown; findings below may reference images not displayed]

FINDINGS: Mild atrophy, typical for age. Patchy hyperintensity throughout the
cerebral white matter compatible with moderate chronic microvascular
ischemia. Mild chronic microvascular ischemia in the pons.

Negative for acute infarct.  Negative for mass or edema.

Chronic micro hemorrhage left thalamus and left temporoparietal
lobe.

Craniocervical junction is normal.  Pituitary is normal in size.

Paranasal sinuses are clear.
IMPRESSION: Chronic microvascular ischemic change. No acute infarct. Mild
chronic microhemorrhage compatible with chronic hypertension.
